# Patient Record
Sex: Male | Born: 1945 | Race: White | Hispanic: No | Marital: Married | Smoking: Former smoker
Health system: Southern US, Community
[De-identification: ages and names within clinical notes are randomized; demographics above are authoritative.]

## PROBLEM LIST (undated history)

## (undated) DIAGNOSIS — C3490 Malignant neoplasm of unspecified part of unspecified bronchus or lung: Secondary | ICD-10-CM

## (undated) DIAGNOSIS — K589 Irritable bowel syndrome without diarrhea: Secondary | ICD-10-CM

## (undated) DIAGNOSIS — Z974 Presence of external hearing-aid: Secondary | ICD-10-CM

## (undated) DIAGNOSIS — I739 Peripheral vascular disease, unspecified: Secondary | ICD-10-CM

## (undated) DIAGNOSIS — E039 Hypothyroidism, unspecified: Secondary | ICD-10-CM

## (undated) DIAGNOSIS — C9110 Chronic lymphocytic leukemia of B-cell type not having achieved remission: Secondary | ICD-10-CM

## (undated) DIAGNOSIS — K635 Polyp of colon: Secondary | ICD-10-CM

## (undated) DIAGNOSIS — D099 Carcinoma in situ, unspecified: Secondary | ICD-10-CM

## (undated) DIAGNOSIS — Z973 Presence of spectacles and contact lenses: Secondary | ICD-10-CM

## (undated) DIAGNOSIS — K219 Gastro-esophageal reflux disease without esophagitis: Secondary | ICD-10-CM

## (undated) DIAGNOSIS — Z9289 Personal history of other medical treatment: Secondary | ICD-10-CM

## (undated) DIAGNOSIS — L409 Psoriasis, unspecified: Secondary | ICD-10-CM

## (undated) DIAGNOSIS — C649 Malignant neoplasm of unspecified kidney, except renal pelvis: Secondary | ICD-10-CM

## (undated) DIAGNOSIS — I10 Essential (primary) hypertension: Secondary | ICD-10-CM

## (undated) DIAGNOSIS — M109 Gout, unspecified: Secondary | ICD-10-CM

## (undated) DIAGNOSIS — E785 Hyperlipidemia, unspecified: Secondary | ICD-10-CM

## (undated) DIAGNOSIS — L309 Dermatitis, unspecified: Secondary | ICD-10-CM

## (undated) DIAGNOSIS — C679 Malignant neoplasm of bladder, unspecified: Secondary | ICD-10-CM

## (undated) DIAGNOSIS — J189 Pneumonia, unspecified organism: Secondary | ICD-10-CM

## (undated) DIAGNOSIS — F419 Anxiety disorder, unspecified: Secondary | ICD-10-CM

## (undated) DIAGNOSIS — R0600 Dyspnea, unspecified: Secondary | ICD-10-CM

## (undated) DIAGNOSIS — I2510 Atherosclerotic heart disease of native coronary artery without angina pectoris: Secondary | ICD-10-CM

## (undated) DIAGNOSIS — R21 Rash and other nonspecific skin eruption: Secondary | ICD-10-CM

## (undated) HISTORY — DX: Psoriasis, unspecified: L40.9

## (undated) HISTORY — DX: Irritable bowel syndrome, unspecified: K58.9

## (undated) HISTORY — DX: Rash and other nonspecific skin eruption: R21

## (undated) HISTORY — DX: Pneumonia, unspecified organism: J18.9

## (undated) HISTORY — DX: Personal history of other medical treatment: Z92.89

## (undated) HISTORY — DX: Carcinoma in situ, unspecified: D09.9

## (undated) HISTORY — DX: Peripheral vascular disease, unspecified: I73.9

## (undated) HISTORY — DX: Gastro-esophageal reflux disease without esophagitis: K21.9

## (undated) HISTORY — DX: Hyperlipidemia, unspecified: E78.5

## (undated) HISTORY — DX: Gout, unspecified: M10.9

## (undated) HISTORY — DX: Essential (primary) hypertension: I10

## (undated) HISTORY — DX: Malignant neoplasm of unspecified kidney, except renal pelvis: C64.9

## (undated) HISTORY — DX: Polyp of colon: K63.5

## (undated) HISTORY — DX: Dermatitis, unspecified: L30.9

## (undated) HISTORY — PX: FINGER SURGERY: SHX640

## (undated) HISTORY — DX: Dyspnea, unspecified: R06.00

## (undated) HISTORY — DX: Atherosclerotic heart disease of native coronary artery without angina pectoris: I25.10

## (undated) HISTORY — PX: TONSILLECTOMY: SUR1361

## (undated) HISTORY — PX: COLONOSCOPY: SHX174

## (undated) HISTORY — PX: CARDIAC CATHETERIZATION: SHX172

---
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
## 1995-04-25 DIAGNOSIS — D229 Melanocytic nevi, unspecified: Secondary | ICD-10-CM

## 1995-04-25 HISTORY — DX: Melanocytic nevi, unspecified: D22.9

## 2001-09-17 ENCOUNTER — Ambulatory Visit (HOSPITAL_COMMUNITY): Admission: RE | Admit: 2001-09-17 | Discharge: 2001-09-17 | Payer: Self-pay | Admitting: Cardiovascular Disease

## 2001-09-17 ENCOUNTER — Encounter: Payer: Self-pay | Admitting: Cardiovascular Disease

## 2004-12-25 HISTORY — PX: CORONARY STENT PLACEMENT: SHX1402

## 2005-01-19 ENCOUNTER — Ambulatory Visit (HOSPITAL_COMMUNITY): Admission: RE | Admit: 2005-01-19 | Discharge: 2005-01-19 | Payer: Self-pay | Admitting: Internal Medicine

## 2005-01-23 ENCOUNTER — Ambulatory Visit: Payer: Self-pay | Admitting: Cardiology

## 2005-01-27 ENCOUNTER — Ambulatory Visit: Payer: Self-pay | Admitting: Cardiology

## 2005-01-27 ENCOUNTER — Ambulatory Visit (HOSPITAL_COMMUNITY): Admission: RE | Admit: 2005-01-27 | Discharge: 2005-01-28 | Payer: Self-pay | Admitting: Cardiology

## 2005-02-06 ENCOUNTER — Ambulatory Visit: Payer: Self-pay | Admitting: Cardiology

## 2005-05-23 ENCOUNTER — Ambulatory Visit: Payer: Self-pay | Admitting: Cardiology

## 2005-10-24 ENCOUNTER — Ambulatory Visit: Payer: Self-pay

## 2005-10-26 ENCOUNTER — Ambulatory Visit: Payer: Self-pay | Admitting: Cardiology

## 2005-11-02 ENCOUNTER — Ambulatory Visit: Payer: Self-pay | Admitting: Cardiology

## 2005-11-02 ENCOUNTER — Ambulatory Visit (HOSPITAL_COMMUNITY): Admission: RE | Admit: 2005-11-02 | Discharge: 2005-11-02 | Payer: Self-pay | Admitting: Cardiology

## 2005-11-22 ENCOUNTER — Ambulatory Visit: Payer: Self-pay | Admitting: Cardiology

## 2005-12-25 DIAGNOSIS — C649 Malignant neoplasm of unspecified kidney, except renal pelvis: Secondary | ICD-10-CM

## 2005-12-25 HISTORY — DX: Malignant neoplasm of unspecified kidney, except renal pelvis: C64.9

## 2006-05-18 ENCOUNTER — Ambulatory Visit: Payer: Self-pay | Admitting: Cardiology

## 2006-08-29 ENCOUNTER — Ambulatory Visit (HOSPITAL_COMMUNITY): Admission: RE | Admit: 2006-08-29 | Discharge: 2006-08-29 | Payer: Self-pay | Admitting: Urology

## 2006-09-12 ENCOUNTER — Ambulatory Visit (HOSPITAL_COMMUNITY): Admission: RE | Admit: 2006-09-12 | Discharge: 2006-09-12 | Payer: Self-pay | Admitting: Urology

## 2006-09-12 ENCOUNTER — Encounter (INDEPENDENT_AMBULATORY_CARE_PROVIDER_SITE_OTHER): Payer: Self-pay | Admitting: *Deleted

## 2006-09-27 ENCOUNTER — Ambulatory Visit (HOSPITAL_COMMUNITY): Admission: RE | Admit: 2006-09-27 | Discharge: 2006-09-27 | Payer: Self-pay | Admitting: Urology

## 2006-10-11 ENCOUNTER — Ambulatory Visit: Payer: Self-pay | Admitting: Cardiology

## 2006-11-20 ENCOUNTER — Ambulatory Visit: Payer: Self-pay

## 2006-11-21 ENCOUNTER — Ambulatory Visit: Admission: RE | Admit: 2006-11-21 | Discharge: 2006-11-21 | Payer: Self-pay | Admitting: Urology

## 2006-12-25 HISTORY — PX: NEPHRECTOMY: SHX65

## 2006-12-28 ENCOUNTER — Inpatient Hospital Stay (HOSPITAL_COMMUNITY): Admission: RE | Admit: 2006-12-28 | Discharge: 2006-12-31 | Payer: Self-pay | Admitting: Urology

## 2006-12-28 ENCOUNTER — Encounter (INDEPENDENT_AMBULATORY_CARE_PROVIDER_SITE_OTHER): Payer: Self-pay | Admitting: Specialist

## 2007-04-16 ENCOUNTER — Ambulatory Visit: Payer: Self-pay | Admitting: Cardiology

## 2007-04-16 LAB — CONVERTED CEMR LAB
BUN: 17 mg/dL (ref 6–23)
CO2: 31 meq/L (ref 19–32)
Calcium: 10.4 mg/dL (ref 8.4–10.5)
Chloride: 106 meq/L (ref 96–112)
Creatinine, Ser: 1.5 mg/dL (ref 0.4–1.5)
GFR calc Af Amer: 62 mL/min
Glucose, Bld: 96 mg/dL (ref 70–99)
Potassium: 4.3 meq/L (ref 3.5–5.1)

## 2007-12-24 ENCOUNTER — Ambulatory Visit (HOSPITAL_COMMUNITY): Admission: RE | Admit: 2007-12-24 | Discharge: 2007-12-24 | Payer: Self-pay | Admitting: Urology

## 2008-04-15 ENCOUNTER — Ambulatory Visit: Payer: Self-pay | Admitting: Cardiology

## 2009-04-06 DIAGNOSIS — K219 Gastro-esophageal reflux disease without esophagitis: Secondary | ICD-10-CM

## 2009-04-06 DIAGNOSIS — Z8719 Personal history of other diseases of the digestive system: Secondary | ICD-10-CM

## 2009-04-06 DIAGNOSIS — I739 Peripheral vascular disease, unspecified: Secondary | ICD-10-CM | POA: Insufficient documentation

## 2009-04-06 DIAGNOSIS — I2510 Atherosclerotic heart disease of native coronary artery without angina pectoris: Secondary | ICD-10-CM | POA: Insufficient documentation

## 2009-04-06 DIAGNOSIS — C649 Malignant neoplasm of unspecified kidney, except renal pelvis: Secondary | ICD-10-CM

## 2009-04-06 DIAGNOSIS — E785 Hyperlipidemia, unspecified: Secondary | ICD-10-CM | POA: Insufficient documentation

## 2009-04-07 ENCOUNTER — Ambulatory Visit: Payer: Self-pay | Admitting: Cardiology

## 2009-04-07 ENCOUNTER — Encounter: Payer: Self-pay | Admitting: Cardiology

## 2009-05-13 ENCOUNTER — Ambulatory Visit: Payer: Self-pay

## 2009-05-13 ENCOUNTER — Ambulatory Visit: Payer: Self-pay | Admitting: Cardiology

## 2009-07-26 ENCOUNTER — Ambulatory Visit (HOSPITAL_COMMUNITY): Admission: RE | Admit: 2009-07-26 | Discharge: 2009-07-26 | Payer: Self-pay | Admitting: Urology

## 2010-04-21 ENCOUNTER — Ambulatory Visit: Payer: Self-pay | Admitting: Cardiology

## 2010-07-29 ENCOUNTER — Ambulatory Visit (HOSPITAL_COMMUNITY): Admission: RE | Admit: 2010-07-29 | Discharge: 2010-07-29 | Payer: Self-pay | Admitting: Urology

## 2011-01-24 NOTE — Assessment & Plan Note (Signed)
Summary: f1y  Medications Added NITROSTAT 0.4 MG SUBL (NITROGLYCERIN) 1 tablet under tongue at onset of chest pain; you may repeat every 5 minutes for up to 3 doses. RE CHLORDIAZEPOXIDE/CLIDINIUM 5-2.5 MG CAPS (CLIDINIUM-CHLORDIAZEPOXIDE) up to 2 as needed CENTRUM SILVER  TABS (MULTIPLE VITAMINS-MINERALS) Take 1 tablet by mouth once a day VITAMIN D 1000 UNIT  TABS (CHOLECALCIFEROL) Take 1 tablet by mouth once a day VITAMIN B-12 500 MCG  TABS (CYANOCOBALAMIN) Take 1 tablet by mouth once a day STOOL SOFTENER 100 MG CAPS (DOCUSATE SODIUM) Take 1 capsule by mouth two times a day MELATONIN 200 MCG TABS (MELATONIN) Take 1 tab by mouth at bedtime VALERIAN ROOT 450 MG CAPS (VALERIAN) Take 1 capsule by mouth at bedtime        Visit Type:  1 year follow up Primary Provider:  Elmore Guise  CC:  No complains.  History of Present Illness: In general has done well.  Took up YOGA, and he has also lost some weight.  So he feels very good, no chest pain, and overall doing well.  Nothing at all related to his heart.  Strained a rib.  Otherwise is doing extremely well.    Current Medications (verified): 1)  Atenolol 100 Mg Tabs (Atenolol) .... Take One Tablet By Mouth Daily 2)  Claritin 10 Mg Caps (Loratadine) .... Take 1 Capsule By Mouth Once A Day 3)  Aspirin 81 Mg Tbec (Aspirin) .... Take One Tablet By Mouth Daily 4)  Lipitor 10 Mg Tabs (Atorvastatin Calcium) .... Take One Tablet By Mouth Daily. 5)  Omeprazole 20 Mg Tbec (Omeprazole) .... Take 1 Tablet By Mouth Once A Day 6)  Alprazolam 0.5 Mg Tabs (Alprazolam) .... As Needed 7)  Amitriptyline Hcl 10 Mg Tabs (Amitriptyline Hcl) .Marland Kitchen.. 1 Tab Pm 8)  Levothroid 150 Mcg Tabs (Levothyroxine Sodium) .Marland Kitchen.. 1 Tablet By Mouth Once Daily 9)  Tylenol Ex St Arthritis Pain 500 Mg Tabs (Acetaminophen) .Marland Kitchen.. 1 Tab Every Eve 10)  Sm St Johns Wort 300 Mg Tabs ([address] Johns Wort) .... Once Daily 11)  Aler-Tab 25 Mg Tabs (Diphenhydramine Hcl) .... Once Daily 12)  Metamucil 30.9  % Powd (Psyllium) .... 2 To 3 Tsp Qd 13)  Nitrostat 0.4 Mg Subl (Nitroglycerin) .Marland Kitchen.. 1 Tablet Under Tongue At Onset of Chest Pain; You May Repeat Every 5 Minutes For Up To 3 Doses. 14)  Re Chlordiazepoxide/clidinium 5-2.5 Mg Caps (Clidinium-Chlordiazepoxide) .... Up To 2 As Needed 15)  Centrum Silver  Tabs (Multiple Vitamins-Minerals) .... Take 1 Tablet By Mouth Once A Day 16)  Vitamin D 1000 Unit  Tabs (Cholecalciferol) .... Take 1 Tablet By Mouth Once A Day 17)  Vitamin B-12 500 Mcg  Tabs (Cyanocobalamin) .... Take 1 Tablet By Mouth Once A Day 18)  Stool Softener 100 Mg Caps (Docusate Sodium) .... Take 1 Capsule By Mouth Two Times A Day 19)  Melatonin 200 Mcg Tabs (Melatonin) .... Take 1 Tab By Mouth At Bedtime 20)  Valerian Root 450 Mg Caps (Valerian) .... Take 1 Capsule By Mouth At Bedtime  Allergies: 1)  ! Sulfa 2)  ! Demerol  Past History:  Past Medical History: Last updated: 04/06/2009 CAD (ICD-414.00) HYPERLIPIDEMIA (ICD-272.4) PVD (ICD-443.9) RENAL CELL CANCER (ICD-189.0) GERD (ICD-530.81) IRRITABLE BOWEL SYNDROME, HX OF (ICD-V12.79)  Past Surgical History: Last updated: 04/06/2009  1. He had a tonsillectomy as a child.  2. He had finger surgery when he was 65 years old.  3. He had a colonoscopy.  4. He has a drug-eluting  stent as noted.   5. renal cell carcinoma   Family History: Last updated: April 21, 2009 His father died at 44. He had what sounds like peripheral vascular disease and also had lung cancer. His mother died at 39 and had hypertension and congestive heart failure. He has a sister who has hypertension.  Social History: Last updated: Apr 21, 2009   He has approximately 2 beers or a glass of wine per  day.  He has never smoked. Married - no children   Vital Signs:  Patient profile:   65 year old male Height:      65 inches Weight:      143.75 pounds BMI:     24.01 Pulse rate:   65 / minute Pulse rhythm:   regular Resp:     18 per minute BP sitting:    120 / 70  (left arm) Cuff size:   regular  Vitals Entered By: Vikki Ports (April 21, 2010 12:09 PM)  Physical Exam  General:  Well developed, well nourished, in no acute distress. Head:  normocephalic and atraumatic Eyes:  PERRLA/EOM intact; conjunctiva and lids normal. Lungs:  Clear bilaterally to auscultation and percussion. Heart:  PMI non displaced.  Normal S1 and S2.  No murmur or rub, or gallop.  Extremities:  No clubbing or cyanosis. Neurologic:  Alert and oriented x 3.   EKG  Procedure date:  04/21/2010  Findings:      NSR.  Nonspecific ST and T abnormality.    Impression & Recommendations:  Problem # 1:  CAD (ICD-414.00) Continues to do well from a symptomatic standpoint.  Would continue medical therapy at this point.  No changes at present.   The following medications were removed from the medication list:    Plavix 75 Mg Tabs (Clopidogrel bisulfate) .Marland Kitchen... Take one tablet by mouth daily His updated medication list for this problem includes:    Atenolol 100 Mg Tabs (Atenolol) .Marland Kitchen... Take one tablet by mouth daily    Aspirin 81 Mg Tbec (Aspirin) .Marland Kitchen... Take one tablet by mouth daily    Nitrostat 0.4 Mg Subl (Nitroglycerin) .Marland Kitchen... 1 tablet under tongue at onset of chest pain; you may repeat every 5 minutes for up to 3 doses.  Orders: EKG w/ Interpretation (93000)  Problem # 2:  HYPERLIPIDEMIA (ICD-272.4) under the management of Dr. Chilton Si.  Continue follow up with primary physician.   His updated medication list for this problem includes:    Lipitor 10 Mg Tabs (Atorvastatin calcium) .Marland Kitchen... Take one tablet by mouth daily.  Orders: EKG w/ Interpretation (93000)  Patient Instructions: 1)  Your physician has requested that you have an exercise tolerance test in 1 YEAR.  For further information please visit [email or web address]  Please also follow instruction sheet, as given.  2)  Your physician recommends that you continue on your current medications as directed.  Please refer to the Current Medication list given to you today.

## 2011-05-09 NOTE — Letter (Signed)
April 15, 2008    Erskine Speed, M.D.  [address]  [place], [place] [address]   RE:  Bradley Irwin, Bradley Irwin  MRN:  [id number]  /  DOB:  08-13-1946   Dear Ed:   I had the pleasure of seeing Bradley Irwin in the office today in followup.  From a cardiac standpoint, Bradley Irwin continues to do well.  Bradley Irwin denies any  ongoing chest pain.  Bradley Irwin has been able to be relatively active without  any difficulty.  Bradley Irwin has recently stopped his Nexium and replaced that  with omeprazole 20 mg daily.   MEDICATIONS:  1. Avapro 300 mg daily.  2. Atenolol 100 mg daily.  3. Flonase 2 puffs daily.  4. Claritin daily.  5. Aspirin 81 mg daily.  6. Plavix 75 mg daily.  7. Lipitor 10 mg daily.  8. Omeprazole 20 mg daily.   PHYSICAL EXAMINATION:  GENERAL:  Bradley Irwin is alert and oriented in no  distress.  VITAL SIGNS:  Blood pressure 120/80, pulse is 90, 5 feet 9 inches,  weight is 176 pounds.  LUNGS:  Fields are clear to auscultation and percussion.  CARDIAC:  Rhythm is regular without a significant murmur.   DIAGNOSTICS:  Electrocardiogram demonstrates normal sinus rhythm and  minor nonspecific T-wave abnormality.  Compared to previous tracings,  there is less T-wave inversion laterally.   DISCUSSION:  In general, Bradley Irwin is doing extremely well.  I am pleased with  his progress.  Bradley Irwin and I discussed the drug interaction between proton  pump inhibitors and Plavix, but Bradley Irwin is well out beyond the year from the  time of his stent implantation.  Based on this, we would not make any  changes.  Bradley Irwin and I also discussed the use of dual antiplatelet therapy  on a long-term continuing basis.  At the present time, the guidelines  recommend 1 year of Plavix, but there is considerable controversy over  this with regard to the issue of late stent thrombosis and very late  stent thrombosis.  This will be an area of continued study and Bradley Irwin and I  discussed this.   I plan to see him back in one years' time and I would be happy to see  him in the interim if further problems develop.  Please do not hesitate  to let me know.  Bradley Irwin tells me that you are following his lipids closely.    Sincerely,      Bradley Morton. Riley Kill, MD, [facility]  Electronically Signed    TDS/MedQ  DD: 04/15/2008  DT: 04/15/2008  Job #: [id number]

## 2011-05-12 NOTE — Cardiovascular Report (Signed)
NAMEPEYSON, POSTEMA              ACCOUNT NO.:  [id number]   MEDICAL RECORD NO.:  [id number]          PATIENT TYPE:  OIB   LOCATION:  2899                         FACILITY:  [facility]   PHYSICIAN:  Arturo Morton. Riley Kill, M.D. [facility] OF BIRTH:  14-Nov-1946   DATE OF PROCEDURE:  11/02/2005  DATE OF DISCHARGE:                              CARDIAC CATHETERIZATION   INDICATIONS:  Mr. Stawicki is a very delightful 65 year old gentleman well-  known to me.  He previously underwent stenting of the left anterior  descending artery in the ___________ trial.  He has had some mild atypical  symptoms, but has also had marked ST depression despite a perfusion scan  that does not demonstrate significant perfusion deficit.  Based on this, I  felt that repeat angiography was warranted.  The current study was done to  assess coronary anatomy.   PROCEDURE:  1.  Left heart catheterization.  2.  Selective coronary territory.  3.  Selective left ventriculography.   DESCRIPTION OF PROCEDURE:  The patient was brought to the catheterization  laboratory, prepped and draped in the usual fashion. Through an anterior  puncture the right femoral artery was easily entered.  A 6-French sheath was  placed.  Views of the left and right coronaries were obtained and multiple  angiographic projections.  Central aortic left atrial pressures were  measured with pigtail.  Ventriculography was formed in the RAO projection.  There were no complications.  He was taken to the holding area in  satisfactory clinical condition.  I then reviewed the films with the patient  and also with his wife.   HEMODYNAMIC DATA:  1.  Central aortic pressure 138/83, mean 105.  2.  Left ventricular pressure 128/8.  No gradient pullback across aortic      valve.   ANGIOGRAPHIC DATA:  1.  The left main coronary is free of critical disease.  2.  The left anterior descending artery demonstrates about an area of about      30-40% narrowing  segmentally in the proximal LAD overlapping the origin      of the diagonal. The diagonal is moderate in distribution, but is fairly      small in caliber.  It has an 80% ostial stenosis.  Compared to the      previous study there is perhaps very mild progression of disease in the      proximal LAD.  The mid vessel, where the stent was previously placed      remains widely patent with excellent runoff into the distal vessel.  The      distal LAD is free of critical disease.  3.  The circumflex is large-caliber vessel with mild proximal irregularity      overlapping the origin of the first marginal branch.  Importantly, after      the administration of intracoronary nitroglycerin this area looked more      like 30-40% due to vasodilatation of the distal vessel.  However, none      of this appeared to be flow-limiting and was fairly large in caliber.  4.  The right coronary  artery is a large-caliber vessel.  There is perhaps      mild irregularity in the mid vessel but no areas of high-grade disease.  5.  Ventriculography in the RAO projection reveals vigorous global systolic      function without segmental wall motion abnormality.   CONCLUSION:  1.  Normal left ventricular function.  2.  Continued patency of the stent and the mid left anterior descending      artery.  3.  Proximal plaquing of the left anterior descending system and fairly high-      grade narrowing of small diagonal branch.  4.  Scattered irregularities in the right circumflex artery as noted above.   RECOMMENDATIONS:  1.  Continued aggressive medical therapy is warranted.  The patient clearly      has a widely patent stent without evidence of compromise.  Moreover, he      does have scattered disease otherwise with a high-grade disease      involving a diagonal which is at least moderate in size and moderate-      sized distribution. The overall caliber is small.  It is not ideal for      percutaneous intervention  because the exit of the diagonal was right in      area of segmental plaquing in the LAD and would clearly result in      compromise of the LAD proper.  As a result a continued medical therapy      program would be recommended with aggressive risk factor reduction.  He      may need enhanced antihypertensive therapy, and I have discussed this      with Dr. Chilton Si and will forward the information to Dr. Chilton Si so that      appropriate changes may be made.      Arturo Morton. Riley Kill, M.D. [facility]  Electronically Signed     TDS/MEDQ  D:  11/02/2005  T:  11/02/2005  Job:  [id number]   cc:   Erskine Speed, M.D.  Fax: [phone]   Cardiac laboratory

## 2011-05-12 NOTE — Discharge Summary (Signed)
Bradley Irwin, Bradley Irwin              ACCOUNT NO.:  [id number]   MEDICAL RECORD NO.:  [id number]          PATIENT TYPE:  INP   LOCATION:  1439                         [facility]ACILITY:  [facility]   PHYSICIAN:  Ronald L. Earlene Plater, M.D.  DATE OF BIRTH:  [birth year]   DATE OF ADMISSION:  12/28/2006  DATE OF DISCHARGE:  12/31/2006                               DISCHARGE SUMMARY   HISTORY AND PHYSICAL EXAMINATION:  Mr. Borthwick is a very nice, 65-year-  old, white male who was found to have a right renal mass.  He underwent  a MRI which was indeterminate.  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  He had  needle aspiration which was atypical and the cell culture was suspicious  for renal cell carcinoma.  He was considering partial versus total  nephrectomy.  He had a drug-eluting stent and had a thorough workup and  it was felt that he needed to be maintained on aspirin.  And, after  understanding the risks, benefits, and alternatives was admitted to  undergo this procedure.   PAST MEDICAL HISTORY SOCIAL HISTORY FAMILY HISTORY, AND REVIEW OF  SYSTEMS:  Please see signed patient medical history sheet in full  detail.   PHYSICAL EXAMINATION:  He is afebrile, vital signs stable.  GENERAL:  A well-nourished, well-developed, in no acute stress, oriented  x3.  HEAD, EYES, NOSE, AND THROAT:  Normal.  NECK:  Without masses or thyromegaly.  HEART:  Normal sinus rhythm without murmurs or gallops.  ABDOMEN:  Soft, nontender without mass or organomegaly.  CHEST:  Clear in the posterior without rales or rhonchi.  EXTREMITIES:  Normal.  NEURO:  Intact.  GENITALIA:  Normal.   HOSPITAL COURSE:  The patient was admitted and after undergoing proper  preoperative evaluation was, subsequently, taken to surgery and  underwent a right radical nephrectomy uneventfully.  Postoperatively,  initially, he was doing very well.  His initial postoperative lab  results were essentially normal.  His I and O was adequate and he  continued to  do well.  By postoperative day #1, he continued to do well.  Hemoglobin and  hematocrit were stable. Creatinine had a slight increase  to 1.79 but he became more active and continued to progressively  improve.  His diet was gradually increased.  By December 30, 2006, he had  bowel movements x2.  His Foley catheter was discontinued.  The wound was  clean and soft and he became ambulatory, and he was subsequently  discharged on December 31, 2006.  He was afebrile.  Vital signs were  stable.  Chest was clear.  Abdomen soft.  He was tolerating a diet,  having bowel movements.  Wound was clean and dry.   DISCHARGE MEDICATIONS:  Included Tylox.   DISCHARGE CONDITION:  Improved.  He was given specific instruction.      Ronald L. Earlene Plater, M.D.  Electronically Signed     RLD/MEDQ  D:  12/31/2006  T:  12/31/2006  Job:  [id number]

## 2011-05-12 NOTE — Assessment & Plan Note (Signed)
[facility]                              CARDIOLOGY OFFICE NOTE   Bradley, Irwin                     MRN:          [id number]  DATE:10/11/2006                            DOB:          1946-08-31    HISTORY:  Bradley Irwin is in today for a followup visit.  He has a lesion on  his right kidney.  He is scheduled on November 23, 2006 to undergo surgery  with Dr. Earlene Plater.  This will be removed.  The patient has not been having  significant chest pain.  He did get restudied after a stent was placed, and  his stent was patent but he does have diagonal disease, but there has been  no change in his symptomatology.   CURRENT MEDICATIONS:  1. Avapro 300 mg daily.  2. Atenolol 100 mg daily.  3. Nexium 40 mg daily.  4. Flonase daily.  5. Claritin daily.  6. Aspirin 81 mg daily.  7. Plavix 75 mg daily.  8. Lipitor 10 daily.  9. Vitamins.   PHYSICAL EXAMINATION:  GENERAL:  He is an alert, oriented gentleman in no  acute distress.  VITAL SIGNS:  Blood pressure 134/84, pulse 62.  CHEST:  Lung fields are clear and the cardiac rhythm is regular.   ELECTROCARDIOGRAM:  The electrocardiogram demonstrates normal sinus rhythm  with nonspecific lateral ST-T wave changes.  Compared to previous tracings  these are virtually unchanged.   ASSESSMENT AND PLAN:  The patient does need surgery.  I want to confirm that  there is not a substantial change in his routine treadmill.  His last prior  myocardial perfusion study did not demonstrate a perfusion defect despite  the EKG abnormalities.  With this, my inclination would be to just make sure  that the stress electrocardiogram has not changed prior to surgery.  I would  hope that he could remain on low-dose aspirin up to the time of surgery  although Plavix can be stopped 5 days to 1 week prior to surgery.       Arturo Morton. Riley Kill, MD, [facility]   TDS/MedQ  DD:  10/11/2006 DT:  10/12/2006 Job #:  [id number]

## 2011-05-12 NOTE — Op Note (Signed)
Bradley Irwin, Bradley Irwin              ACCOUNT NO.:  [id number]   MEDICAL RECORD NO.:  [id number]          PATIENT TYPE:  INP   LOCATION:  0003                         FACILITY:  [facility]   PHYSICIAN:  Lucrezia Starch. Earlene Plater, M.D.  DATE OF BIRTH:  [birth year]   DATE OF PROCEDURE:  12/28/2005  DATE OF DISCHARGE:                               OPERATIVE REPORT   PREOPERATIVE DIAGNOSIS:  Right renal lesion.   POSTOPERATIVE DIAGNOSIS:  Right renal lesion.   OPERATIVE PROCEDURE:  Right radical nephrectomy.   SURGEON:  Lucrezia Starch. Earlene Plater, M.D.   ASSISTANT:  Veverly Fells. Vernie Ammons, M.D.   ANESTHESIA:  General endotracheal anesthesia.   ESTIMATED BLOOD LOSS:  150 mL.   TUBES:  16 French Foley catheter.   COMPLICATIONS:  None.   INDICATIONS FOR PROCEDURE:  Bradley Irwin is a very nice 65 year old white  male who was found on a scan to have a right renal lesion.  He  subsequently underwent an MRI which revealed a Bosniac-3 lesion and  needle aspiration revealed atypia.  He has considered all options and  after understanding the risks, benefits, and alternatives, elected to  undergo the above procedure.  Of note, he has had a full workup by Dr.  Bonnee Quin and has a drug eluting stent.  He has to be maintained on  aspirin.  It is a posterior lesion and it was felt that laparoscopic or  robotic partial nephrectomy was probably not indicated and he knows that  total nephrectomy may be indicated.   PROCEDURE IN DETAIL:  The patient was placed in a supine position.  After general endotracheal anesthesia, he was placed in the right flank  anterior position and prepped and draped with Betadine in a sterile  fashion.  A 16 French Foley catheter was inserted and the bladder was  drained of clear urine.  A right subcostal incision was made, sharp  dissection was carried down through the subcutaneous tissue.  The  external oblique, internal oblique, and transverse abdominis muscles and  fascia were incised along with  the anterior and posterior rectus sheath.  The peritoneal cavity was entered.  Exploratory laparotomy was performed  and no abnormalities were noted.   The line of Toldt was incised in the right gutter and the colon was  reflected medially.  Gerota's fascia was approached and dissected free  and the anterior Gerota's fascia was dissected.  The hilum was serially  dissected.  The renal artery and renal vein were isolated and positioned  so that they could be clamped to do a partial nephrectomy if indicated.  Next, the area of the lesion was dissected free, Gerota's fascia was  taken down.  It was very indiscrete and difficult to discern.  It was a  possibly palpable lesion, however, there was significant coverage of  parenchyma and it was felt that a partial nephrectomy would be totally  unsafe and inexact.  With a 21 gauge needle, it was aspirated and  nothing could be aspirated.  Again, with the suspicious renal lesion, it  was felt that partial nephrectomy was really not appropriate in this  situation.  The renal artery was ligated proximally with two #1 silk  ties and one distally, cut as was the renal vein in a similar manner.  The adrenal was left intact and dissection was carried down to the  junction of Gerota's fascia between the adrenal and the kidney.  The  ureter was clipped and cut and the specimen was removed.   Thorough irrigation was performed, good hemostasis was noted to be  present.  Surgicel was placed in the adrenal bed, although it was not  oozing significantly, he was on aspirin and it was felt that this would  be appropriate, and the bowel contents were placed back into the right  gutter.  Good hemostasis was noted to be present.  The abdomen was  closed in two layers with the posterior rectus sheath along with the  transversalis and internal oblique fascia and a running #1 PDS and  anterior rectus sheath and external oblique fascia were closed with a  running #1  PDS.  The subcutaneous tissue was irrigated.  The skin was  closed with skin staples.  The patient tolerated the procedure well.  We  had the pathologist cut the specimen who felt like there was a soft  tissue mass that was deeply embedded within the kidney.      Ronald L. Earlene Plater, M.D.  Electronically Signed     RLD/MEDQ  D:  12/28/2006  T:  12/28/2006  Job:  [id number]

## 2011-05-12 NOTE — Discharge Summary (Signed)
NAME:  Bradley Irwin, SANFILIPPO NO.:  [id number]   MEDICAL RECORD NO.:  [id number]          PATIENT TYPE:  OIB   LOCATION:  6532                         FACILITY:  [facility]   PHYSICIAN:  Carole Binning, M.D. LHCDATE OF BIRTH:  1946-07-14   DATE OF ADMISSION:  01/27/2005  DATE OF DISCHARGE:  01/28/2005                           DISCHARGE SUMMARY - REFERRING   DISCHARGING PHYSICIAN:  Carole Binning, M.D. [facility]   SUMMARY OF HISTORY:  Mr. Morua is a 65 year old male who has been followed  by Dr. Chilton Si for many years and describes some discomfort in his chest for  which he underwent stress testing. Stress testing showed ST-segment  depression, and thus he was referred to Dr. Riley Kill on January 23, 2005, for  cardiac catheterization. The patient describes that when he goes up about  two flights of stairs he will feel a fullness in his chest. He also  describes a soreness which seems to be there most of the time.   His history is notable for hypertension, recent outpatient testing secondary  to colonoscopy.   LABORATORY DATA:  Chest x-ray showed no active disease, chronic changes.  EKGs showed normal sinus rhythm, normal axis, and nonspecific ST-T wave  changes. Pre-procedure H&H was 14.8 and 45.1, normal indices, platelets  228,000, WBC 7.8. PT 25.8, PT 11.2. Sodium 136, potassium 4.2, BUN 19,  creatinine 1.2, normal LFTs. Hemoglobin A1C 5.8. Subsequent hematologies  were unremarkable. Subsequent chemistries were unremarkable. Post-procedure  CK, MB, and troponin were negative for myocardial infarction.   HOSPITAL COURSE:  Mr. Tabet was brought in for outpatient cardiac  catheterization. This was performed by Dr. Riley Kill on January 27, 2005.  Please see dictated note. His ejection fraction was within normal limits.  Dr. Riley Kill performed a stent placement to the proximal LAD reducing a 60%  to 90% lesion to 0% utilizing the Co-STAR protocol.  Following sheath  removal and  bedrest, the cardiac catheterization site was intact.  On  January 28, 2005, Dr. Gerri Spore reviewed the patient's chart, examined the  patient, and felt that he could be discharged home. EKG did not show any  acute changes, neither did his blood work. Thus, it was felt he could be  discharged home.   DISCHARGE DIAGNOSES:  1.  Unstable angina.  2.  Coronary artery disease, status post stenting of the left anterior      descending utilizing the Co-STAR protocol history as previously.   DISPOSITION:  He is discharged to home.   MEDICATIONS:  1.  Aspirin 325 mg daily.  2.  Plavix 75 mg daily for at least one year.  3.  Avapro 150 daily.  4.  Atenolol 100 mg daily.  5.  Nexium 400 mg daily.  6.  Flonase daily.  7.  Nitroglycerin 0.4 as needed.   He is advised no lifting, driving, sexual activity, or heavy exertion for  two days. He is to maintain a low-salt, low-fat, low-cholesterol diet. If he  has any problems with his catheterization site he is asked to call the  office. He is asked to call the office on  Monday to arrange a follow-up  appointment with Dr. Riley Kill. At the time of follow-up with Dr. Riley Kill,  consideration should be given into revealing his lipid status for continue  cardiac risk factor modification. It was noted at the time of discharge that  Dr. Gerri Spore also recommended that the patient discontinue his Neo-  Synephrine nasal spray.      EW/MEDQ  D:  01/28/2005  T:  01/28/2005  Job:  [id number]   cc:   Erskine Speed, M.D.  [address], [address]  [place]  [place] [address]  Fax: [phone]

## 2011-05-12 NOTE — H&P (Signed)
NAMEANGELL, HONSE              ACCOUNT NO.:  [id number]   MEDICAL RECORD NO.:  [id number]          PATIENT TYPE:  INP   LOCATION:  0003                         FACILITY:  [facility]   PHYSICIAN:  Lucrezia Starch. Earlene Plater, M.D.  DATE OF BIRTH:  May 09, 1946   DATE OF ADMISSION:  12/28/2006  DATE OF DISCHARGE:                              HISTORY & PHYSICAL   CHIEF COMPLAINT:  I have a kidney mass.   HISTORY OF PRESENT ILLNESS:  Mr. Bradley Irwin is a very nice 65 year old black  male who was found to have a right renal mass.  He underwent an MRI,  which revealed an indeterminate lesion, was felt to be a Bosniak 3  lesion.  He subsequently underwent needle aspiration of it, which  revealed atypical cells.  We have considered partial versus total  nephrectomy.  He has a drug-eluting stent and must be maintained on  aspirin.  He has been seen by Dr. Bonnee Quin and has had a full workup.  It is a posterior lesion so we felt that laparoscopically or robotically  would not be an appropriate candidate, so felt the procedure needed to  be done open.  After understanding risks, benefits and alternatives, he  selected to proceed with exploration, possible partial versus radical  nephrectomy.   PAST MEDICAL HISTORY:   ALLERGIES:  SULFA AND SUDAFED.   ILLNESSES:  1. He has coronary artery disease as noted.  2. Hypertension.  3. Peripheral vascular disease.  4. He has had hiatal hernia with reflux.  5. Wears corrective lenses.  6. History of pneumonia in 11/07.  7. IBS.   SURGERY:  1. He had a tonsillectomy as a child.  2. He had finger surgery when he was 65 years old.  3. He had a colonoscopy.  4. He has a drug-eluting stent as noted.   MEDICATIONS:  1. Avapro.  2. Atenolol.  3. Nexium.  4. Flonase.  5. Claritin.  6. Aspirin.  7. Plavix, which he stopped for surgery.  8. Lipitor.  9. Imodium.  10.Librax.  11.Xanax.  12.Amitriptyline.  13.Nitroglycerin.   SOCIAL HISTORY:  He has  approximately 2 beers or a glass of wine per  day.  He has never smoked.   FAMILY HISTORY:  Non-significant for current problem.   REVIEW OF SYSTEMS:  He has no shortness of breath, dyspnea on exertion,  chest pain or GI complaints.   PHYSICAL EXAMINATION:  VITAL SIGNS:  Blood pressure 175/97, pulse 70,  respirations 12, temperature 98.2 Fahrenheit.  GENERAL:  He is well developed, well nourished, alert and oriented x3.  HEAD/EYES/EARS/NOSE AND THROAT:  Normal.  NECK:  No masses or thyromegaly.  CHEST:  Normal __________ motion.  ABDOMEN:  Soft, nontender, without masses, organomegaly or hernias.  HEART:  Normal S1, S2 without murmurs or gallops.  EXTREMITIES:  Normal.  NEURO:  Intact.  SKIN:  Normal.  PENIS/MEATUS/SCROTUM/TESTICLE/ADNEXA/ANUS:  __________.  Prostate 25 g,  smooth, symmetrical, benign.   IMPRESSION:  Right renal lesion.   PLAN:  Renal exploration with partial versus radical nephrectomy.      Ronald L. Earlene Plater, M.D.  Electronically Signed     RLD/MEDQ  D:  12/28/2006  T:  12/28/2006  Job:  [id number]

## 2011-05-12 NOTE — Procedures (Signed)
[facility]                              EXERCISE TREADMILL   JUANA, MONTINI                     MRN:          [id number]  DATE:11/20/2006                            DOB:          1946/06/01    Duration of exercise was 6 minutes.  Maximum heart rate 127.  Percent of  PMHR is 78%.   COMMENTS:  Mr. Arzuaga exercised today on the Bruce protocol.  Overall  exercise tolerance was fair.  He experienced no chest pain.  The test  was terminated due to fatigue.  Peak heart rate reached 127 or 78% of  age-predicted maximum.  The resting electrocardiogram demonstrates  nonspecific ST flattening in the inferolateral leads.  This is slightly  enhanced at maximum exercise, but no more than about a millimeter.  It  is clearly less than what was seen on the previous stress test, although  the heart rate was not quite as high.  Importantly, the patient  experienced no chest pain.  Also of importance is the fact that on the  basis of the last exercise treadmill study, the patient underwent repeat  cardiac catheterization which demonstrated a high-grade ostial lesion of  a small diagonal, but continued patency of the stent. His myocardial  perfusion study at that time was also normal.   To summarize, this very nice gentleman has undergone placement of a drug-  eluting stent in the proximal left anterior descending artery.  He was  part of the COSTAR clinical trial.  This involves paclitaxel with both  stent platforms.  The patient had followup catheterization which  demonstrated a widely patent stent.  There was 30-40% proximal narrowing  of the LAD, and an 80% ostial stenosis of the diagonal with a widely  patent stent.  The circumflex has 30-40% narrowing and the right  coronary artery is without significant disease.  Overall systolic  function is well preserved.  This catheterization was done in November  2006.  The patient has continued to do well from a  cardiovascular  standpoint.  He has a lesion on his right kidney.  He has not been  having any exertional symptoms.  Importantly, he has discontinued his  Plavix in anticipation of surgery.  Our recommendation has been that he  continue low-dose baby aspirin throughout the course of the operation,  and he actually will take this up until the day of the operation, then  withhold it the morning of.   We will be happy to see him during his hospitalization and at the  present time it is pretty clear that they need to go ahead and get the  lesion out.     Arturo Morton. Riley Kill, MD, [facility]  Electronically Signed    TDS/MedQ  DD: 11/20/2006  DT: 11/20/2006  Job #: [id number]   cc:   Windy Fast L. Earlene Plater, M.D.  Erskine Speed, M.D.

## 2011-05-12 NOTE — Assessment & Plan Note (Signed)
[facility]                            CARDIOLOGY OFFICE NOTE   DURANT, SCIBILIA                     MRN:          [id number]  DATE:04/16/2007                            DOB:          1946/12/06    Mr. Cacioppo is in for a followup visit.  He underwent surgery, and did  have a small contained renal cell carcinoma.  The day after his surgery  his creatinine went up to about 1.8.  He has denied any chest pain.  He  was maintained on aspirin throughout the procedure without any negative  consequences.  He has followed up with Dr. Chilton Si as well.  His last  chest x-ray was unremarkable.   Today on physical the blood pressure is 130/82.  The pulse is 57.  LUNG FIELDS:  Are clear.  CARDIAC:  Rhythm is regular.  His incision is healing well.   His electrocardiogram demonstrates normal sinus rhythm and nonspecific  ST-T abnormalities.   IMPRESSION:  1. Coronary artery disease.  Status post stenting of the left anterior      descending artery with a drug-eluting platform.  2. Recent surgery for renal cell cancer.  3. Hyperlipidemia.   RECOMMENDATIONS:  1. Continued followup with Dr. Chilton Si.  2. Return to Cardiology in 1 year for discussion regarding dual      antiplatelet therapy.  3. We will get a basic metabolic profile, as this was last checked      just after surgery.  I will forward this to Dr. Chilton Si.     Arturo Morton. Riley Kill, MD, [facility]  Electronically Signed    TDS/MedQ  DD: 04/16/2007  DT: 04/16/2007  Job #: [id number]   cc:   Erskine Speed, M.D.  Ronald L. Earlene Plater, M.D.

## 2011-05-12 NOTE — Cardiovascular Report (Signed)
NAMEADRIANA, LINA              ACCOUNT NO.:  [id number]   MEDICAL RECORD NO.:  [id number]          PATIENT TYPE:  OIB   LOCATION:  6532                         FACILITY:  [facility]   PHYSICIAN:  Arturo Morton. Riley Kill, M.D. [facility] OF BIRTH:  1946-11-27   DATE OF PROCEDURE:  01/27/2005  DATE OF DISCHARGE:                              CARDIAC CATHETERIZATION   INDICATIONS:  Mr. Saladin is a 65 year old gentleman who underwent exercise  testing by Dr. Chilton Si.  This was found to be positive with both angina and ST  depression.  In addition, he has had significant hypertension which has been  under treatment. The current study was done to assess coronary anatomy.   PROCEDURE:  1.  Left heart catheterization.  2.  Selective coronary arteriography.  3.  Selective left ventriculography.  4.  Percutaneous transluminal coronary angioplasty and stenting of the left      anterior descending artery.   DESCRIPTION OF PROCEDURE:  The patient was brought to the catheterization  laboratory, prepped and draped in usual fashion.  Through the anterior  puncture, the right femoral artery was easily entered. A 6-French sheath was  placed using left and right coronary stent and multiple angiographic  projections. Central aortic ventricular pressures were measured with a  pigtail.  Ventriculography was performed in the RAO projection. The patient  had evidence of a high-grade left anterior descending stenosis. With this,  we discussed the various options, including PTCA of the left anterior  descending artery or treatment or bypass. The patient was desirous of  proceeding with percutaneous intervention. He had previously consented to  participate in the CoStar study. With this, he was agreeable to proceed. I  then reviewed the films with his wife and discussed the various options. The  decision was made to proceed today in the laboratory. The patient was given  300 milligrams of oral clopidogrel.  The patient  was also given heparin and  Integrilin, according to protocol. A JL-35 guide was used as well as a Luge  wire. Pre-dilatation was done with a 2.25 mm 15 mm length Maverick balloon.  Following this, a 3.0 x 28 CoStar study stent was placed in the left  anterior descending artery. This was taken up to 16 atmospheres. There was  marked improvement the appearance of the vessel. Generous doses of  intracoronary nitroglycerin were given because of recurrent spasm. A 3.25  Quantum Maverick was used to post dilate the stent. Dr. Chilton Si actually was  in the laboratory for the stent deployment. The patient had a nice  angiographic result. All catheters were subsequently moved to the femoral  sheath sewn into place. He was taken to the holding area in satisfactory  clinical condition.   HEMODYNAMIC DATA:  1.  Central aortic pressure 155/85, mean 113.  2.  Left ventricular pressure 156/16.  3.  No gradient on pullback across the aortic valve.   ANGIOGRAPHIC DATA:  1.  Ventriculography was performed in the RAO projection. Overall systolic      function was preserved. No segmental abnormalities or contraction were      identified.  2.  Left main coronary was free of critical disease.  3.  The left anterior descending artery courses to the apex. There is fair      amount of segmental plaquing proximally with about 30-40% eccentric      plaque but with a residual lumen at least two and a half. Just beyond      the takeoff of the first diagonal branch and after the first septal,      there is an area in the mid vessel with about 60% segmental plaquing,      followed by a focal 80-90% area of stenosis. Across this area was placed      28 mm x 275 CoStar study stent. This entire area was reduced to 0%      residual luminal narrowing with an excellent angiographic result. Post      dilatation was done with a 3.25 Quantum Maverick balloon. There is no      evidence of edge tear. There was excellent runoff  into the distal vessel      and what appeared to be good apposition.  There is a smaller-to-mid-      sized first diagonal branch that has about 80% proximal narrowing and 7%      mid narrowing.  It is a fairly small-caliber vessel.   The circumflex provides a first marginal and then multiple marginals  distally with about 30% mid narrowing. No critical area of disease was  noted.   The right coronary is large-caliber dominant vessel with 30-40% mid-  narrowing, and no high-grade areas of disease or noted.   CONCLUSION:  1.  Preserved left ventricular function.  2.  Successful percutaneous stenting of the mid left anterior descending      artery.  3.  Scattered lesions of the right circumflex, as noted above.  4.  Moderate plaquing of the proximal left anterior descending artery as      described in the above text.   DISPOSITION:  The patient will be treated medically. He will need aspirin  and Plavix for at least one year. Follow-up will be with Dr. Riley Kill.  Aggressive risk factor reduction will be recommended to reduce plaque  burden.      TDS/MEDQ  D:  01/27/2005  T:  01/27/2005  Job:  [id number]   cc:   Erskine Speed, M.D.  [address]  [place]  [place] [address]  Fax: [phone]

## 2011-05-17 ENCOUNTER — Encounter: Payer: Self-pay | Admitting: Cardiology

## 2011-05-18 ENCOUNTER — Encounter: Payer: Self-pay | Admitting: Cardiology

## 2011-05-18 ENCOUNTER — Ambulatory Visit (INDEPENDENT_AMBULATORY_CARE_PROVIDER_SITE_OTHER): Payer: BC Managed Care – PPO | Admitting: Cardiology

## 2011-05-18 DIAGNOSIS — I10 Essential (primary) hypertension: Secondary | ICD-10-CM | POA: Insufficient documentation

## 2011-05-18 DIAGNOSIS — E785 Hyperlipidemia, unspecified: Secondary | ICD-10-CM

## 2011-05-18 DIAGNOSIS — I2510 Atherosclerotic heart disease of native coronary artery without angina pectoris: Secondary | ICD-10-CM

## 2011-05-18 NOTE — Progress Notes (Signed)
HPI:  Patient is doing well.  He has no chest pain with exertion.  He denies other symptoms.  BP is higher, but not usually.  Of note, he is doing yoga now.  No current symptoms.   Current Outpatient Prescriptions  Medication Sig Dispense Refill  . Acetaminophen (TYLENOL ARTHRITIS PAIN PO) Take by mouth daily.        Marland Kitchen ALPRAZolam (XANAX) 0.5 MG tablet Take 0.5 mg by mouth at bedtime as needed.        Marland Kitchen aspirin 81 MG tablet Take 81 mg by mouth daily.        Marland Kitchen atenolol (TENORMIN) 100 MG tablet Take 100 mg by mouth daily.        Marland Kitchen atorvastatin (LIPITOR) 10 MG tablet Take 10 mg by mouth daily.        . Cholecalciferol (VITAMIN D) 1000 UNITS capsule Take 1,000 Units by mouth daily.        . clidinium-chlordiazePOXIDE (LIBRAX) 2.5-5 MG per capsule Take 1 capsule by mouth 3 (three) times daily as needed.        . cyanocobalamin 500 MCG tablet Take 500 mcg by mouth. Twice a month      . diphenhydrAMINE (BENADRYL) 25 MG tablet Take 25 mg by mouth daily.        Marland Kitchen docusate sodium (COLACE) 100 MG capsule Take 100 mg by mouth daily.       Marland Kitchen levothyroxine (SYNTHROID, LEVOTHROID) 125 MCG tablet Take 125 mcg by mouth daily.        Marland Kitchen loratadine (CLARITIN) 10 MG tablet Take 10 mg by mouth daily.        . nitroGLYCERIN (NITROSTAT) 0.4 MG SL tablet Place 0.4 mg under the tongue every 5 (five) minutes as needed.        . Nutritional Supplements (MELATONIN PO) Take by mouth daily.        Marland Kitchen omeprazole (PRILOSEC) 20 MG capsule Take 20 mg by mouth daily.        Satira Sark Johns Wort 300 MG TABS Take by mouth as needed.       Gerarda Fraction Root 450 MG CAPS Take by mouth daily.        Marland Kitchen DISCONTD: multivitamin (THERAGRAN) per tablet Take 1 tablet by mouth daily.        Marland Kitchen DISCONTD: amitriptyline (ELAVIL) 10 MG tablet Take 10 mg by mouth at bedtime.        Marland Kitchen DISCONTD: levothyroxine (SYNTHROID, LEVOTHROID) 150 MCG tablet Take 150 mcg by mouth daily.        Marland Kitchen DISCONTD: Psyllium (METAMUCIL) WAFR Take by mouth. 2-3 tsp daily          Allergies  Allergen Reactions  . Meperidine Hcl     REACTION: severe headache  . Sulfa Antibiotics   . Sulfonamide Derivatives     Past Medical History  Diagnosis Date  . Coronary artery disease   . Hyperlipidemia   . PVD (peripheral vascular disease)   . Renal cell cancer   . GERD (gastroesophageal reflux disease)   . IBS (irritable bowel syndrome)     Past Surgical History  Procedure Date  . Tonsillectomy   . Finger surgery 65 y/o  . Colonoscopy   . Coronary stent placement     Family History  Problem Relation Age of Onset  . Hypertension Mother   . Heart failure Mother   . Peripheral vascular disease Father   . Lung cancer Father   . Hypertension Sister  History   Social History  . Marital Status: Married    Spouse Name: N/A    Number of Children: N/A  . Years of Education: N/A   Occupational History  . Not on file.   Social History Main Topics  . Smoking status: Never Smoker   . Smokeless tobacco: Not on file  . Alcohol Use: Yes  . Drug Use: Not on file  . Sexually Active: Not on file   Other Topics Concern  . Not on file   Social History Narrative  . No narrative on file    ROS: Please see the HPI.  All other systems reviewed and negative.  PHYSICAL EXAM:  BP 163/92  Pulse 69  Resp 18  Ht 5\' 6"  (1.676 m)  Wt 148 lb 12.8 oz (67.495 kg)  BMI 24.02 kg/m2  General: Well developed, well nourished, in no acute distress. Head:  Normocephalic and atraumatic. Neck: no JVD Lungs: Clear to auscultation and percussion. Heart: Normal S1 and S2.  No murmur, rubs or gallops.  Abdomen:  Normal bowel sounds; soft; non tender; no organomegaly Pulses: Pulses normal in all 4 extremities. Extremities: No clubbing or cyanosis. No edema. Neurologic: Alert and oriented x 3.  EKG:  NSR with repole changes of LVH.    ASSESSMENT AND PLAN:

## 2011-05-18 NOTE — Assessment & Plan Note (Signed)
Followed by Dr Green 

## 2011-05-18 NOTE — Patient Instructions (Signed)
Your physician wants you to follow-up in: 18 MONTHS.  You will receive a reminder letter in the mail two months in advance. If you don't receive a letter, please call our office to schedule the follow-up appointment.  Your physician recommends that you continue on your current medications as directed. Please refer to the Current Medication list given to you today.

## 2011-05-18 NOTE — Assessment & Plan Note (Signed)
Followed closely with Dr. Chilton Si.  He is managing his BP meds.  Has LVH with repole changes on EKG.  Continue medical therapy.

## 2011-05-18 NOTE — Assessment & Plan Note (Signed)
Has not had recurrent symptoms.  Old cath report reviewed.  Will continue to monitor in eighteen months.  Has diagonal involvement and some diffuse disease with known abnormal GXT, but asymptomatic, so would not repeat at this point in the absence of symptom change.

## 2011-08-09 ENCOUNTER — Ambulatory Visit (HOSPITAL_COMMUNITY)
Admission: RE | Admit: 2011-08-09 | Discharge: 2011-08-09 | Disposition: A | Discharge status: Home / Self Care | Payer: BC Managed Care – PPO | Source: Ambulatory Visit | Attending: Urology | Admitting: Urology

## 2011-08-09 ENCOUNTER — Other Ambulatory Visit (HOSPITAL_COMMUNITY): Payer: Self-pay | Admitting: Urology

## 2011-08-09 DIAGNOSIS — C649 Malignant neoplasm of unspecified kidney, except renal pelvis: Secondary | ICD-10-CM | POA: Insufficient documentation

## 2011-08-09 DIAGNOSIS — Z87891 Personal history of nicotine dependence: Secondary | ICD-10-CM | POA: Insufficient documentation

## 2011-08-17 ENCOUNTER — Other Ambulatory Visit (HOSPITAL_COMMUNITY): Payer: Self-pay | Admitting: Urology

## 2011-08-20 ENCOUNTER — Ambulatory Visit (HOSPITAL_COMMUNITY)
Admission: RE | Admit: 2011-08-20 | Discharge: 2011-08-20 | Disposition: A | Discharge status: Home / Self Care | Payer: BC Managed Care – PPO | Source: Ambulatory Visit | Attending: Urology | Admitting: Urology

## 2011-08-20 DIAGNOSIS — K824 Cholesterolosis of gallbladder: Secondary | ICD-10-CM | POA: Insufficient documentation

## 2011-08-20 DIAGNOSIS — D7389 Other diseases of spleen: Secondary | ICD-10-CM | POA: Insufficient documentation

## 2011-08-20 DIAGNOSIS — K7689 Other specified diseases of liver: Secondary | ICD-10-CM | POA: Insufficient documentation

## 2011-08-20 DIAGNOSIS — Z85528 Personal history of other malignant neoplasm of kidney: Secondary | ICD-10-CM | POA: Insufficient documentation

## 2011-08-20 MED ORDER — GADOBENATE DIMEGLUMINE 529 MG/ML IV SOLN
13.0000 mL | Freq: Once | INTRAVENOUS | Status: AC | PRN
  Administered 2011-08-20: 13 mL via INTRAVENOUS

## 2011-09-08 ENCOUNTER — Other Ambulatory Visit (INDEPENDENT_AMBULATORY_CARE_PROVIDER_SITE_OTHER): Payer: Self-pay

## 2011-09-12 ENCOUNTER — Encounter (INDEPENDENT_AMBULATORY_CARE_PROVIDER_SITE_OTHER): Payer: Self-pay | Admitting: General Surgery

## 2011-09-12 ENCOUNTER — Ambulatory Visit (INDEPENDENT_AMBULATORY_CARE_PROVIDER_SITE_OTHER): Payer: BC Managed Care – PPO | Admitting: General Surgery

## 2011-09-12 VITALS — BP 136/96 | HR 80 | Temp 97.8°F | Resp 16 | Ht 66.0 in | Wt 154.2 lb

## 2011-09-12 DIAGNOSIS — R160 Hepatomegaly, not elsewhere classified: Secondary | ICD-10-CM

## 2011-09-12 DIAGNOSIS — C649 Malignant neoplasm of unspecified kidney, except renal pelvis: Secondary | ICD-10-CM

## 2011-09-12 DIAGNOSIS — K769 Liver disease, unspecified: Secondary | ICD-10-CM

## 2011-09-12 NOTE — Assessment & Plan Note (Addendum)
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 Will refer to Oncology. Likely will be proceeding toward surgery at that time. Could possibly do laparoscopic resection, but pt may have significant adhesions from prior R nephrectomy.   Will also present at GI conference.

## 2011-09-12 NOTE — Progress Notes (Signed)
Chief Complaint  Patient presents with  . Other    EVal liver mass referred by dr. Mena Goes. HX of renal cell carcinoma    HISTORY: Pt had CXR this summer for follow up for renal cell cancer dx 2008.  The chest xray was abnormal, and a Chest CT was performed.  This demonstrated a hypervascular mass in the r lobe near the gallbladder.  He has subsequently undergone MR with no other lesions seen in the liver.  He is asymptomatic.  He has not had any other issues.  He denies weight loss or abdominal pain.  He denies changes in his bowel habits, but has alternating diarrhea and constipation.  He has had a colonoscopy in the last 3-5 years that was negative for polyps or cancer.  He has a family history of lung cancer.  He has heart disease, but since having a stent placed in 2006, he has not had any chest pain or shortness of breath, and underwent nephrectomy without difficulty.    Past Medical History  Diagnosis Date  . Coronary artery disease   . Hyperlipidemia   . PVD (peripheral vascular disease)   . Renal cell cancer   . GERD (gastroesophageal reflux disease)   . IBS (irritable bowel syndrome)   . Hypertension   . Hearing loss   . Rash     Past Surgical History  Procedure Date  . Tonsillectomy   . Finger surgery 65 y/o  . Colonoscopy   . Coronary stent placement   . Kidney removed 12/28/2006    right    Current Outpatient Prescriptions  Medication Sig Dispense Refill  . ALPRAZolam (XANAX) 0.5 MG tablet Take 0.5 mg by mouth as needed.       . Acetaminophen (TYLENOL ARTHRITIS PAIN PO) Take by mouth daily.        Marland Kitchen aspirin 81 MG tablet Take 81 mg by mouth daily.        Marland Kitchen atenolol (TENORMIN) 100 MG tablet Take 100 mg by mouth daily.        Marland Kitchen atorvastatin (LIPITOR) 10 MG tablet Take 10 mg by mouth daily.        . Cholecalciferol (VITAMIN D) 1000 UNITS capsule Take 1,000 Units by mouth daily.        . clidinium-chlordiazePOXIDE (LIBRAX) 2.5-5 MG per capsule Take 1 capsule by mouth 3  (three) times daily as needed.        . cyanocobalamin 500 MCG tablet Take 500 mcg by mouth. Twice a month      . diphenhydrAMINE (BENADRYL) 25 MG tablet Take 25 mg by mouth daily.        Marland Kitchen docusate sodium (COLACE) 100 MG capsule Take 100 mg by mouth daily.       Marland Kitchen levothyroxine (SYNTHROID, LEVOTHROID) 125 MCG tablet Take 125 mcg by mouth daily.        Marland Kitchen loratadine (CLARITIN) 10 MG tablet Take 10 mg by mouth daily.        . nitroGLYCERIN (NITROSTAT) 0.4 MG SL tablet Place 0.4 mg under the tongue every 5 (five) minutes as needed.        . Nutritional Supplements (MELATONIN PO) Take by mouth daily.        Marland Kitchen omeprazole (PRILOSEC) 20 MG capsule Take 20 mg by mouth daily.        Gerarda Fraction Root 450 MG CAPS Take by mouth daily.           Allergies  Allergen Reactions  .  Meperidine Hcl     REACTION: severe headache  . Sulfonamide Derivatives   . Demerol Other (See Comments)    Severe headache.  . Sulfa Antibiotics Other (See Comments)    Happened when he was child. Does not remember reaction.     Family History  Problem Relation Age of Onset  . Hypertension Mother   . Heart failure Mother   . Heart disease Mother   . Peripheral vascular disease Father   . Lung cancer Father   . Cancer Father     lung  . Hypertension Sister      History   Social History  . Marital Status: Married    Spouse Name: N/A    Number of Children: N/A  . Years of Education: N/A   Social History Main Topics  . Smoking status: Former Smoker    Quit date: 10/26/1983  . Smokeless tobacco: Never Used  . Alcohol Use: Yes     2 per day on occasion  . Drug Use: No  . Sexually Active: None   Other Topics Concern  . None   Social History Narrative  . None     REVIEW OF SYSTEMS - PERTINENT POSITIVES ONLY: 12 point review of systems negative other than HPI and PMH except for hearing loss.   EXAM: Filed Vitals:   09/12/11 1610  BP: 136/96  Pulse: 80  Temp: 97.8 F (36.6 C)  Resp: 16     Gen:  No acute distress.  Well nourished and well groomed.   Neurological: Alert and oriented to person, place, and time. Coordination normal.  Head: Normocephalic and atraumatic.  Eyes: Conjunctivae are normal. Pupils are equal, round, and reactive to light. No scleral icterus.  Neck: Normal range of motion. Neck supple. No tracheal deviation or thyromegaly present. Soft tissue mass 6 cm on posterior neck consistent with lipoma.   Cardiovascular: Normal rate, regular rhythm, normal heart sounds and intact distal pulses.  Exam reveals no gallop and no friction rub.  No murmur heard. Respiratory: Effort normal.  No respiratory distress. No chest wall tenderness. Breath sounds normal.  No wheezes, rales or rhonchi.  GI: Soft. Bowel sounds are normal. The abdomen is soft and nontender.  There is no rebound and no guarding. There is a R subcostal incision that is well healed without signs of hernia.   Musculoskeletal: Normal range of motion. Extremities are nontender.  Lymphadenopathy: No cervical, preauricular, postauricular or axillary adenopathy is present Skin: Skin is warm and dry. No rash noted. No diaphoresis. No erythema. No pallor. No clubbing, cyanosis, or edema.   Psychiatric: Normal mood and affect. Behavior is normal. Judgment and thought content normal.    RADIOLOGY RESULTS: See E-Chart or I-Site for most recent results.  Images and reports are reviewed and reported above.  CT chest, ct abd/pelvis, MRI.     Liver mass, right lobe Liver mass likely recurrent renal cell cancer Will get IR ultrasound guided biopsy. Will refer to Oncology. Likely will be proceeding toward surgery at that time. Could possibly do laparoscopic resection, but pt may have significant adhesions from prior R nephrectomy.   Will also present at GI conference.    RENAL CELL CANCER August 2012 CT showed new liver lesion.      Maudry Diego MD Surgical Oncology, General and Endocrine  Surgery [facility]    Visit Diagnoses: 1. Liver mass, right lobe   2. RENAL CELL CANCER     Primary Care Physician:  Enrique Sack, MD

## 2011-09-12 NOTE — Assessment & Plan Note (Signed)
August 2012 CT showed new liver lesion.

## 2011-09-18 ENCOUNTER — Other Ambulatory Visit (HOSPITAL_COMMUNITY): Payer: BC Managed Care – PPO

## 2011-09-18 ENCOUNTER — Other Ambulatory Visit: Payer: Self-pay | Admitting: Interventional Radiology

## 2011-09-18 ENCOUNTER — Ambulatory Visit (HOSPITAL_COMMUNITY)
Admission: RE | Admit: 2011-09-18 | Discharge: 2011-09-18 | Disposition: A | Discharge status: Home / Self Care | Payer: BC Managed Care – PPO | Source: Ambulatory Visit | Attending: General Surgery | Admitting: General Surgery

## 2011-09-18 ENCOUNTER — Other Ambulatory Visit (INDEPENDENT_AMBULATORY_CARE_PROVIDER_SITE_OTHER): Payer: Self-pay | Admitting: General Surgery

## 2011-09-18 DIAGNOSIS — R160 Hepatomegaly, not elsewhere classified: Secondary | ICD-10-CM

## 2011-09-18 DIAGNOSIS — C649 Malignant neoplasm of unspecified kidney, except renal pelvis: Secondary | ICD-10-CM | POA: Insufficient documentation

## 2011-09-18 DIAGNOSIS — K7689 Other specified diseases of liver: Secondary | ICD-10-CM | POA: Insufficient documentation

## 2011-09-18 HISTORY — PX: LIVER BIOPSY: SHX301

## 2011-09-18 LAB — CBC
HCT: 43.8 % (ref 39.0–52.0)
Hemoglobin: 15.1 g/dL (ref 13.0–17.0)
MCH: 29.6 pg (ref 26.0–34.0)
MCV: 85.9 fL (ref 78.0–100.0)
Platelets: 156 10*3/uL (ref 150–400)
RBC: 5.1 MIL/uL (ref 4.22–5.81)
WBC: 5.9 10*3/uL (ref 4.0–10.5)

## 2011-09-18 LAB — APTT: aPTT: 27 seconds (ref 24–37)

## 2011-09-21 ENCOUNTER — Telehealth (INDEPENDENT_AMBULATORY_CARE_PROVIDER_SITE_OTHER): Payer: Self-pay | Admitting: General Surgery

## 2011-09-25 ENCOUNTER — Telehealth (INDEPENDENT_AMBULATORY_CARE_PROVIDER_SITE_OTHER): Payer: Self-pay

## 2011-09-25 NOTE — Telephone Encounter (Signed)
Pt called with benign pathology results on 09/21/11.

## 2011-10-04 ENCOUNTER — Encounter (INDEPENDENT_AMBULATORY_CARE_PROVIDER_SITE_OTHER): Payer: Self-pay | Admitting: General Surgery

## 2011-10-04 ENCOUNTER — Ambulatory Visit (INDEPENDENT_AMBULATORY_CARE_PROVIDER_SITE_OTHER): Payer: BC Managed Care – PPO | Admitting: General Surgery

## 2011-10-04 DIAGNOSIS — I2510 Atherosclerotic heart disease of native coronary artery without angina pectoris: Secondary | ICD-10-CM

## 2011-10-04 DIAGNOSIS — K769 Liver disease, unspecified: Secondary | ICD-10-CM

## 2011-10-04 DIAGNOSIS — R160 Hepatomegaly, not elsewhere classified: Secondary | ICD-10-CM

## 2011-10-04 NOTE — Assessment & Plan Note (Signed)
Biopsy was non diagnostic. Given MRI appearance and history of renal cell cancer, will presume malignant until proven otherwise. Plan surgical resection. Will attempt laparoscopic resection, but may have to open given prior surgery and scar location in the RUQ.   Will have to remove gallbladder due to the fact that it is overlying the tumor. Will need hand port.  Discussed risks and benefits of surgery. Discussed bleeding, infection, damage to adjacent structures, bile leak, need for further procedures, pneumonia, urinary retention, and hernia. I discussed that there may be additional lesions in the liver and that we may have to do a larger liver resection than had previously been planned. I also discussed that there may be many lesions and that we may not do the resection.  The patient's primary goal is to get a diagnosis of the mass. The surgery is highly likely to accomplish that goal.  If this is recurrent renal cell cancer or if this is some other form of cancer we will refer him to a oncologist.

## 2011-10-04 NOTE — Assessment & Plan Note (Signed)
We will get cardiology clearance from Dr. Riley Kill before scheduling the operation.

## 2011-10-04 NOTE — Progress Notes (Signed)
Chief Complaint  Patient presents with  . Follow-up    f/u discuss bx    HISTORY: Bradley Irwin is a 65 year old male who had renal cell cancer in 2008. He underwent right nephrectomy. He did not have indications for adjuvant treatment at that time. His past history is reviewed in the last note. This lesion was discovered recently incidentally. He underwent biopsy percutaneously, and pathology demonstrated a benign liver with mild microvesicular stereotactic changes. This is not concordant with what was seen on MRI. Has some mild soreness on the right side as well. He is quite anxious about this diagnosis. He denies any recent chest pain or shortness of breath.   Past Medical History  Diagnosis Date  . Coronary artery disease   . Hyperlipidemia   . PVD (peripheral vascular disease)   . Renal cell cancer   . GERD (gastroesophageal reflux disease)   . IBS (irritable bowel syndrome)   . Hypertension   . Hearing loss   . Rash   . Eczema   . Psoriasis     Past Surgical History  Procedure Date  . Tonsillectomy   . Finger surgery 65 y/o  . Colonoscopy   . Coronary stent placement   . Nephrectomy   . Liver biopsy 09/18/11    Current Outpatient Prescriptions  Medication Sig Dispense Refill  . Acetaminophen (TYLENOL ARTHRITIS PAIN PO) Take by mouth daily.        Marland Kitchen ALPRAZolam (XANAX) 0.5 MG tablet Take 0.5 mg by mouth as needed.       Marland Kitchen aspirin 81 MG tablet Take 81 mg by mouth daily.        Marland Kitchen atenolol (TENORMIN) 100 MG tablet Take 100 mg by mouth daily.        Marland Kitchen atorvastatin (LIPITOR) 10 MG tablet Take 10 mg by mouth daily.        . Cholecalciferol (VITAMIN D) 1000 UNITS capsule Take 1,000 Units by mouth daily.        . clidinium-chlordiazePOXIDE (LIBRAX) 2.5-5 MG per capsule Take 1 capsule by mouth 3 (three) times daily as needed.        . cyanocobalamin 500 MCG tablet Take 500 mcg by mouth. Twice a month      . diphenhydrAMINE (BENADRYL) 25 MG tablet Take 25 mg by mouth daily.          Marland Kitchen docusate sodium (COLACE) 100 MG capsule Take 100 mg by mouth daily.       . fluticasone (CUTIVATE) 0.05 % cream Ad lib.      Marland Kitchen levothyroxine (SYNTHROID, LEVOTHROID) 125 MCG tablet Take 125 mcg by mouth daily.        Marland Kitchen loratadine (CLARITIN) 10 MG tablet Take 10 mg by mouth daily.        . nitroGLYCERIN (NITROSTAT) 0.4 MG SL tablet Place 0.4 mg under the tongue every 5 (five) minutes as needed.        . Nutritional Supplements (MELATONIN PO) Take by mouth daily.        Marland Kitchen omeprazole (PRILOSEC) 20 MG capsule Take 20 mg by mouth daily.        Marland Kitchen PROTOPIC 0.1 % ointment Ad lib.      Gerarda Fraction Root 450 MG CAPS Take by mouth daily.           Allergies  Allergen Reactions  . Meperidine Hcl     REACTION: severe headache  . Sulfonamide Derivatives   . Demerol Other (See Comments)    Severe  headache.  . Sulfa Antibiotics Other (See Comments)    Happened when he was child. Does not remember reaction.     Family History  Problem Relation Age of Onset  . Hypertension Mother   . Heart failure Mother   . Heart disease Mother   . Peripheral vascular disease Father   . Lung cancer Father   . Cancer Father     lung  . Hypertension Sister      History   Social History  . Marital Status: Married    Spouse Name: N/A    Number of Children: N/A  . Years of Education: N/A   Social History Main Topics  . Smoking status: Former Smoker    Quit date: 10/26/1983  . Smokeless tobacco: Never Used  . Alcohol Use: Yes     2 per day on occasion  . Drug Use: No  . Sexually Active: None   Other Topics Concern  . None   Social History Narrative  . None     REVIEW OF SYSTEMS - PERTINENT POSITIVES ONLY: 12 point review of systems negative other than HPI and PMH.   EXAM: Filed Vitals:   10/04/11 1420  BP: 122/88  Pulse: 84  Temp: 98.2 F (36.8 C)  Resp: 16    Gen:  No acute distress.  Well nourished and well groomed.   Neurological: Alert and oriented to person, place, and time.  Coordination normal.  Head: Normocephalic and atraumatic.  Eyes: Conjunctivae are normal. Pupils are equal, round, and reactive to light. No scleral icterus.  Neck: Normal range of motion. Neck supple. No tracheal deviation or thyromegaly present.  Cardiovascular: Normal rate, regular rhythm and intact distal pulses.   Respiratory: Effort normal.  No respiratory distress. No chest wall tenderness. GI: The abdomen is soft and nontender.  There is no rebound and no guarding. There is a right subcostal/flank incision. Musculoskeletal: Normal range of motion. Extremities are nontender.  Skin: Skin is warm and dry. No rash noted. No diaphoresis. No erythema. No pallor. No clubbing, cyanosis, or edema.   Psychiatric: Normal mood and affect. Behavior is normal. Judgment and thought content normal.    LABORATORY RESULTS: Available labs are reviewed. Cbc and coagulation studies are normal.   RADIOLOGY RESULTS: See E-Chart or I-Site for most recent results.  Images and reports are reviewed. 2.2 cm lesion in segment 8 of liver is seen.    ASSESSMENT AND PLAN: Liver mass, right lobe Biopsy was non diagnostic. Given MRI appearance and history of renal cell cancer, will presume malignant until proven otherwise. Plan surgical resection. Will attempt laparoscopic resection, but may have to open given prior surgery and scar location in the RUQ.   Will have to remove gallbladder due to the fact that it is overlying the tumor. Will need hand port.  Discussed risks and benefits of surgery. Discussed bleeding, infection, damage to adjacent structures, bile leak, need for further procedures, pneumonia, urinary retention, and hernia. I discussed that there may be additional lesions in the liver and that we may have to do a larger liver resection than had previously been planned. I also discussed that there may be many lesions and that we may not do the resection.  The patient's primary goal is to get a  diagnosis of the mass. The surgery is highly likely to accomplish that goal.  If this is recurrent renal cell cancer or if this is some other form of cancer we will refer him to a oncologist.  CAD We will get cardiology clearance from Dr. Riley Kill before scheduling the operation.      Maudry Diego MD Surgical Oncology, General and Endocrine Surgery [facility]      Visit Diagnoses: 1. Liver mass, right lobe   2. CAD     Primary Care Physician: Enrique Sack, MD, MD

## 2011-10-04 NOTE — Patient Instructions (Signed)
IF YOU ARE TAKING ASPIRIN, COUMADIN/WARFARIN, PLAVIX, OR OTHER BLOOD THINNER, PLEASE LET us KNOW IMMEDIATELY.  WE WILL NEED TO DISCUSS WITH THE PRESCRIBING PROVIDER IF THESE ARE SAFE TO STOP. IF THESE ARE NOT STOPPED AT THE APPROPRIATE TIME, THIS WILL RESULT IN A DELAY FOR YOUR SURGERY.  DO NOT TAKE THESE MEDICATIONS OR IBUPROFEN/NAPROXEN WITHIN A WEEK BEFORE SURGERY.  The main risks of surgery are bleeding, bile leak, infection, damage to other structures, and hernia at the  incision sites.  There is a risk of having to convert to open surgery.  These complications may lead to additional procedures such as drain placement or endoscopy, and in rare cases may lead to other surgeries.   These are not common risks, but do occur.    Most patients have some constipation in the week after surgery.  You may need over the counter stool softeners or laxatives if you experience difficulty having bowel movements.    Some patients experience diarrhea or experience a need to have a bowel movement shortly after eating.  Please discuss this with me when you come back if that occurs because you may require medication if it is severe.    If the following occur, call our office at [phone]: If you have a fever over 101 or pain that is severe despite narcotics. If you have redness or drainage at the wound. If your stools become clay-colored or you become jaundiced (yellow skin/eyes) If you develop persistent nausea or vomiting.  I will follow you back up in 3-4 weeks.    Please submit any paperwork about time off work/insurance forms to the front desk.

## 2011-10-05 ENCOUNTER — Other Ambulatory Visit (INDEPENDENT_AMBULATORY_CARE_PROVIDER_SITE_OTHER): Payer: Self-pay | Admitting: General Surgery

## 2011-10-05 DIAGNOSIS — R160 Hepatomegaly, not elsewhere classified: Secondary | ICD-10-CM

## 2011-10-10 ENCOUNTER — Telehealth (INDEPENDENT_AMBULATORY_CARE_PROVIDER_SITE_OTHER): Payer: Self-pay

## 2011-10-10 NOTE — Telephone Encounter (Signed)
Left message for Bradley Irwin that it was fine for him to have his flu shot prior to surgery.

## 2011-10-13 ENCOUNTER — Ambulatory Visit (INDEPENDENT_AMBULATORY_CARE_PROVIDER_SITE_OTHER): Payer: BC Managed Care – PPO | Admitting: Cardiology

## 2011-10-13 ENCOUNTER — Encounter: Payer: Self-pay | Admitting: Cardiology

## 2011-10-13 DIAGNOSIS — I2510 Atherosclerotic heart disease of native coronary artery without angina pectoris: Secondary | ICD-10-CM

## 2011-10-13 DIAGNOSIS — I10 Essential (primary) hypertension: Secondary | ICD-10-CM

## 2011-10-13 DIAGNOSIS — R160 Hepatomegaly, not elsewhere classified: Secondary | ICD-10-CM

## 2011-10-13 DIAGNOSIS — K769 Liver disease, unspecified: Secondary | ICD-10-CM

## 2011-10-13 DIAGNOSIS — E785 Hyperlipidemia, unspecified: Secondary | ICD-10-CM

## 2011-10-13 NOTE — Progress Notes (Signed)
HPI:  He is doing well.  Denies any chest pain.  Feels good.  However, has a liver lesion and surgery has been proposed.  The patient has no angina.  He can walk the equivalent of 4 mets or greater without difficulty.  He has LVH with repole changes due to hypertension on his ECG.  They also want to stop his ASA which has done before. Because of his costar stent, our preference has been for continuation unless it poses big risks.  He is doing really quite well.  His  BP is variable for sure.  Most likely the surgery will be laparscopic, but could turn to a full open undertaking.  He has a liver lesion with an indeterminate biopsy.  Seeing Dr. Donell Beers.   Current Outpatient Prescriptions  Medication Sig Dispense Refill  . Acetaminophen (TYLENOL ARTHRITIS PAIN PO) Take by mouth daily.        Marland Kitchen ALPRAZolam (XANAX) 0.5 MG tablet Take 0.5 mg by mouth as needed.       Marland Kitchen aspirin 81 MG tablet Take 81 mg by mouth daily.        Marland Kitchen atenolol (TENORMIN) 100 MG tablet Take 100 mg by mouth daily.        Marland Kitchen atorvastatin (LIPITOR) 10 MG tablet Take 10 mg by mouth daily.        . Cholecalciferol (VITAMIN D) 1000 UNITS capsule Take 1,000 Units by mouth daily.        . clidinium-chlordiazePOXIDE (LIBRAX) 2.5-5 MG per capsule Take 1 capsule by mouth 3 (three) times daily as needed.        . cyanocobalamin 500 MCG tablet Take 500 mcg by mouth. Twice a month      . diphenhydrAMINE (BENADRYL) 25 MG tablet Take 25 mg by mouth daily.        Marland Kitchen docusate sodium (COLACE) 100 MG capsule Take 100 mg by mouth daily.       . fluticasone (CUTIVATE) 0.05 % cream Ad lib.      Marland Kitchen levothyroxine (SYNTHROID, LEVOTHROID) 125 MCG tablet Take 125 mcg by mouth daily.        Marland Kitchen loratadine (CLARITIN) 10 MG tablet Take 10 mg by mouth daily.        . nitroGLYCERIN (NITROSTAT) 0.4 MG SL tablet Place 0.4 mg under the tongue every 5 (five) minutes as needed.        . Nutritional Supplements (MELATONIN PO) Take by mouth daily.        Marland Kitchen omeprazole  (PRILOSEC) 20 MG capsule Take 20 mg by mouth daily.        Marland Kitchen PROTOPIC 0.1 % ointment Ad lib.      Gerarda Fraction Root 450 MG CAPS Take by mouth daily.          Allergies  Allergen Reactions  . Meperidine Hcl     REACTION: severe headache  . Sulfonamide Derivatives   . Demerol Other (See Comments)    Severe headache.  . Sulfa Antibiotics Other (See Comments)    Happened when he was child. Does not remember reaction.    Past Medical History  Diagnosis Date  . Coronary artery disease   . Hyperlipidemia   . PVD (peripheral vascular disease)   . Renal cell cancer   . GERD (gastroesophageal reflux disease)   . IBS (irritable bowel syndrome)   . Hypertension   . Hearing loss   . Rash   . Eczema   . Psoriasis     Past  Surgical History  Procedure Date  . Tonsillectomy   . Finger surgery 65 y/o  . Colonoscopy   . Coronary stent placement   . Nephrectomy   . Liver biopsy 09/18/11    Family History  Problem Relation Age of Onset  . Hypertension Mother   . Heart failure Mother   . Heart disease Mother   . Peripheral vascular disease Father   . Lung cancer Father   . Cancer Father     lung  . Hypertension Sister     History   Social History  . Marital Status: Married    Spouse Name: N/A    Number of Children: N/A  . Years of Education: N/A   Occupational History  . Not on file.   Social History Main Topics  . Smoking status: Former Smoker    Quit date: 10/26/1983  . Smokeless tobacco: Never Used  . Alcohol Use: Yes     2 per day on occasion  . Drug Use: No  . Sexually Active: Not on file   Other Topics Concern  . Not on file   Social History Narrative  . No narrative on file    ROS: Please see the HPI.  All other systems reviewed and negative.  PHYSICAL EXAM:  BP 175/91  Pulse 70  Ht 5\' 6"  (1.676 m)  Wt 152 lb (68.947 kg)  BMI 24.53 kg/m2  General: Well developed, well nourished, in no acute distress. Head:  Normocephalic and atraumatic. Neck:  no JVD Lungs: Clear to auscultation and percussion. Heart: Normal S1 and S2.  No murmur, rubs or gallops.  Abdomen:  Normal bowel sounds; soft; non tender; no organomegaly Pulses: Pulses normal in all 4 extremities. Extremities: No clubbing or cyanosis. No edema. Neurologic: Alert and oriented x 3.  EKG:  NSR.  Voltage for LVH.  ST and T changes, noted previously, consistent with ischemia or repolarization changes.  CATH  2006 CONCLUSION:  1. Preserved left ventricular function.  2. Successful percutaneous stenting of the mid left anterior descending  artery.  3. Scattered lesions of the right circumflex, as noted above.  4. Moderate plaquing of the proximal left anterior descending artery as  described in the above text.  DISPOSITION: The patient will be treated medically. He will need aspirin  and Plavix for at least one year. Follow-up will be with Dr. Riley Kill.  Aggressive risk factor reduction will be recommended to reduce plaque  Burden.  RECATH  NOV 2006 ANGIOGRAPHIC DATA:  1.  The left main coronary is free of critical disease.  2.  The left anterior descending artery demonstrates about an area of about      30-40% narrowing segmentally in the proximal LAD overlapping the origin      of the diagonal. The diagonal is moderate in distribution, but is fairly      small in caliber.  It has an 80% ostial stenosis.  Compared to the      previous study there is perhaps very mild progression of disease in the      proximal LAD.  The mid vessel, where the stent was previously placed      remains widely patent with excellent runoff into the distal vessel.  The      distal LAD is free of critical disease.  3.  The circumflex is large-caliber vessel with mild proximal irregularity      overlapping the origin of the first marginal branch.  Importantly, after  the administration of intracoronary nitroglycerin this area looked more      like 30-40% due to vasodilatation of the distal  vessel.  However, none      of this appeared to be flow-limiting and was fairly large in caliber.  4.  The right coronary artery is a large-caliber vessel.  There is perhaps      mild irregularity in the mid vessel but no areas of high-grade disease.  5.  Ventriculography in the RAO projection reveals vigorous global systolic      function without segmental wall motion abnormality.   CONCLUSION:  1.  Normal left ventricular function.  2.  Continued patency of the stent and the mid left anterior descending      artery.  3.  Proximal plaquing of the left anterior descending system and fairly high-      grade narrowing of small diagonal branch.  4.  Scattered irregularities in the right circumflex artery as noted above.   GXT 04/2009  -  Abnormal ST and T changes but good exercise tolerance.  ETT have been chronically abnormal   ASSESSMENT AND PLAN:

## 2011-10-13 NOTE — Assessment & Plan Note (Signed)
Borderline control.  It would be important for him to continue his beta blockers either PO or IV from the time of surgery.  He could get IV metop if he is NPO after surgery.

## 2011-10-13 NOTE — Assessment & Plan Note (Signed)
He has been stable.  Baseline treadmills have always been abnormal.  He is able to do activity without angina.  Surgery can proceed.  Optimally, he would continue ASA unless it poses an undue risk for him.

## 2011-10-13 NOTE — Patient Instructions (Signed)
Your physician recommends that you continue on your current medications as directed. Please refer to the Current Medication list given to you today.  Your physician wants you to follow-up in: 6 MONTHS. You will receive a reminder letter in the mail two months in advance. If you don't receive a letter, please call our office to schedule the follow-up appointment.  

## 2011-10-13 NOTE — Assessment & Plan Note (Signed)
Managed by Dr. Green. 

## 2011-10-14 NOTE — Assessment & Plan Note (Signed)
Surgery planned by Dr. Donell Beers

## 2011-10-24 ENCOUNTER — Encounter (HOSPITAL_COMMUNITY)
Admission: RE | Admit: 2011-10-24 | Discharge: 2011-10-24 | Disposition: A | Discharge status: Home / Self Care | Payer: BC Managed Care – PPO | Source: Ambulatory Visit | Attending: General Surgery | Admitting: General Surgery

## 2011-10-24 LAB — CBC
Hemoglobin: 15.1 g/dL (ref 13.0–17.0)
MCV: 86.2 fL (ref 78.0–100.0)
Platelets: 182 10*3/uL (ref 150–400)
RBC: 5.22 MIL/uL (ref 4.22–5.81)
WBC: 5.5 10*3/uL (ref 4.0–10.5)

## 2011-10-24 LAB — COMPREHENSIVE METABOLIC PANEL
AST: 26 U/L (ref 0–37)
Albumin: 4.3 g/dL (ref 3.5–5.2)
Calcium: 11.1 mg/dL — ABNORMAL HIGH (ref 8.4–10.5)
Chloride: 104 mEq/L (ref 96–112)
Creatinine, Ser: 1.04 mg/dL (ref 0.50–1.35)
Sodium: 141 mEq/L (ref 135–145)

## 2011-10-24 LAB — APTT: aPTT: 26 seconds (ref 24–37)

## 2011-10-24 LAB — URINALYSIS, ROUTINE W REFLEX MICROSCOPIC
Bilirubin Urine: NEGATIVE
Glucose, UA: NEGATIVE mg/dL
Leukocytes, UA: NEGATIVE
Nitrite: NEGATIVE
Specific Gravity, Urine: 1.023 (ref 1.005–1.030)
pH: 7.5 (ref 5.0–8.0)

## 2011-10-24 LAB — PROTIME-INR: INR: 0.92 (ref 0.00–1.49)

## 2011-10-24 LAB — DIFFERENTIAL
Eosinophils Absolute: 0.1 10*3/uL (ref 0.0–0.7)
Lymphocytes Relative: 27 % (ref 12–46)
Lymphs Abs: 1.5 10*3/uL (ref 0.7–4.0)
Monocytes Relative: 9 % (ref 3–12)
Neutro Abs: 3.3 10*3/uL (ref 1.7–7.7)
Neutrophils Relative %: 61 % (ref 43–77)

## 2011-10-24 LAB — SURGICAL PCR SCREEN: Staphylococcus aureus: POSITIVE — AB

## 2011-10-24 NOTE — Progress Notes (Signed)
Quick Note:  Labs ok for surgery ______ 

## 2011-10-26 ENCOUNTER — Other Ambulatory Visit (INDEPENDENT_AMBULATORY_CARE_PROVIDER_SITE_OTHER): Payer: Self-pay | Admitting: General Surgery

## 2011-10-26 ENCOUNTER — Inpatient Hospital Stay (HOSPITAL_COMMUNITY)
Admission: RE | Admit: 2011-10-26 | Discharge: 2011-10-29 | DRG: 191 | Disposition: A | Discharge status: Home / Self Care | Payer: BC Managed Care – PPO | Source: Ambulatory Visit | Attending: General Surgery | Admitting: General Surgery

## 2011-10-26 DIAGNOSIS — Z01812 Encounter for preprocedural laboratory examination: Secondary | ICD-10-CM

## 2011-10-26 DIAGNOSIS — D135 Benign neoplasm of extrahepatic bile ducts: Secondary | ICD-10-CM

## 2011-10-26 DIAGNOSIS — Z85528 Personal history of other malignant neoplasm of kidney: Secondary | ICD-10-CM

## 2011-10-26 DIAGNOSIS — L259 Unspecified contact dermatitis, unspecified cause: Secondary | ICD-10-CM | POA: Diagnosis present

## 2011-10-26 DIAGNOSIS — J4489 Other specified chronic obstructive pulmonary disease: Secondary | ICD-10-CM | POA: Diagnosis present

## 2011-10-26 DIAGNOSIS — K811 Chronic cholecystitis: Secondary | ICD-10-CM

## 2011-10-26 DIAGNOSIS — Z79899 Other long term (current) drug therapy: Secondary | ICD-10-CM

## 2011-10-26 DIAGNOSIS — I2510 Atherosclerotic heart disease of native coronary artery without angina pectoris: Secondary | ICD-10-CM | POA: Diagnosis present

## 2011-10-26 DIAGNOSIS — R160 Hepatomegaly, not elsewhere classified: Secondary | ICD-10-CM

## 2011-10-26 DIAGNOSIS — K219 Gastro-esophageal reflux disease without esophagitis: Secondary | ICD-10-CM | POA: Diagnosis present

## 2011-10-26 DIAGNOSIS — I10 Essential (primary) hypertension: Secondary | ICD-10-CM | POA: Diagnosis present

## 2011-10-26 DIAGNOSIS — Z7982 Long term (current) use of aspirin: Secondary | ICD-10-CM

## 2011-10-26 DIAGNOSIS — I739 Peripheral vascular disease, unspecified: Secondary | ICD-10-CM | POA: Diagnosis present

## 2011-10-26 DIAGNOSIS — J449 Chronic obstructive pulmonary disease, unspecified: Secondary | ICD-10-CM | POA: Diagnosis present

## 2011-10-26 DIAGNOSIS — D134 Benign neoplasm of liver: Secondary | ICD-10-CM

## 2011-10-26 DIAGNOSIS — E785 Hyperlipidemia, unspecified: Secondary | ICD-10-CM | POA: Diagnosis present

## 2011-10-26 DIAGNOSIS — K769 Liver disease, unspecified: Principal | ICD-10-CM | POA: Diagnosis present

## 2011-10-26 DIAGNOSIS — K589 Irritable bowel syndrome without diarrhea: Secondary | ICD-10-CM | POA: Diagnosis present

## 2011-10-26 HISTORY — PX: LIVER LOBECTOMY: SHX1976

## 2011-10-26 HISTORY — PX: CHOLECYSTECTOMY: SHX55

## 2011-10-27 ENCOUNTER — Encounter (INDEPENDENT_AMBULATORY_CARE_PROVIDER_SITE_OTHER): Payer: Self-pay

## 2011-10-27 LAB — COMPREHENSIVE METABOLIC PANEL
ALT: 217 U/L — ABNORMAL HIGH (ref 0–53)
Calcium: 10 mg/dL (ref 8.4–10.5)
GFR calc Af Amer: >90 mL/min (ref >90)
Glucose, Bld: 135 mg/dL — ABNORMAL HIGH (ref 70–99)
Sodium: 135 mEq/L (ref 135–145)
Total Protein: 5.2 g/dL — ABNORMAL LOW (ref 6.0–8.3)

## 2011-10-27 LAB — CBC
MCH: 28.7 pg (ref 26.0–34.0)
MCHC: 33.1 g/dL (ref 30.0–36.0)
Platelets: 125 10*3/uL — ABNORMAL LOW (ref 150–400)

## 2011-10-27 MED ORDER — TACROLIMUS 0.1 % EX OINT: TOPICAL_OINTMENT | Freq: Every day | CUTANEOUS | Status: DC | PRN

## 2011-10-28 MED ORDER — NITROGLYCERIN 0.4 MG SL SUBL: 0.4000 mg | SUBLINGUAL_TABLET | SUBLINGUAL | Status: DC | PRN

## 2011-10-28 MED ORDER — CHLORHEXIDINE GLUCONATE CLOTH 2 % EX PADS: 6.0000 | MEDICATED_PAD | Freq: Every day | CUTANEOUS | Status: DC

## 2011-10-28 MED ORDER — ATENOLOL 100 MG PO TABS
100.0000 mg | ORAL_TABLET | Freq: Every day | ORAL | Status: DC
  Filled 2011-10-28 (×2): qty 1

## 2011-10-28 MED ORDER — BISACODYL 10 MG RE SUPP: 10.0000 mg | Freq: Every day | RECTAL | Status: DC | PRN

## 2011-10-28 MED ORDER — OXYCODONE HCL 5 MG PO TABS
10.0000 mg | ORAL_TABLET | ORAL | Status: DC | PRN
  Administered 2011-10-29 (×2): 10 mg via ORAL
  Filled 2011-10-28: qty 2

## 2011-10-28 MED ORDER — HYDROMORPHONE HCL PF 1 MG/ML IJ SOLN: 1.0000 mg | INTRAMUSCULAR | Status: DC | PRN

## 2011-10-28 MED ORDER — ONDANSETRON HCL 4 MG/2ML IJ SOLN: 4.0000 mg | Freq: Four times a day (QID) | INTRAMUSCULAR | Status: DC | PRN

## 2011-10-28 MED ORDER — MORPHINE SULFATE 2 MG/ML IJ SOLN: 1.0000 mg | INTRAMUSCULAR | Status: DC | PRN

## 2011-10-28 MED ORDER — DOCUSATE SODIUM 100 MG PO CAPS
100.0000 mg | ORAL_CAPSULE | Freq: Two times a day (BID) | ORAL | Status: DC
  Administered 2011-10-29: 100 mg via ORAL
  Filled 2011-10-28: qty 1

## 2011-10-28 MED ORDER — MUPIROCIN 2 % EX OINT
TOPICAL_OINTMENT | Freq: Two times a day (BID) | CUTANEOUS | Status: DC
  Administered 2011-10-29: 10:00:00 via NASAL
  Filled 2011-10-28: qty 22

## 2011-10-28 MED ORDER — ACETAMINOPHEN 325 MG PO TABS
650.0000 mg | ORAL_TABLET | ORAL | Status: DC | PRN
  Administered 2011-10-29: 650 mg via ORAL

## 2011-10-28 MED ORDER — POTASSIUM CHLORIDE 2 MEQ/ML IV SOLN
INTRAVENOUS | Status: DC
  Filled 2011-10-28 (×2): qty 1000

## 2011-10-28 MED ORDER — ENOXAPARIN SODIUM 40 MG/0.4ML ~~LOC~~ SOLN
40.0000 mg | Freq: Every day | SUBCUTANEOUS | Status: DC
  Administered 2011-10-29: 40 mg via SUBCUTANEOUS
  Filled 2011-10-28 (×2): qty 0.4

## 2011-10-28 MED ORDER — LORAZEPAM 1 MG PO TABS: 1.0000 mg | ORAL_TABLET | Freq: Four times a day (QID) | ORAL | Status: DC | PRN

## 2011-10-28 MED ORDER — LEVOTHYROXINE SODIUM 125 MCG PO TABS
125.0000 ug | ORAL_TABLET | Freq: Every day | ORAL | Status: DC
  Administered 2011-10-29: 125 ug via ORAL
  Filled 2011-10-28 (×2): qty 1

## 2011-10-28 MED ORDER — CILIDINIUM-CHLORDIAZEPOXIDE 2.5-5 MG PO CAPS
1.0000 | ORAL_CAPSULE | Freq: Two times a day (BID) | ORAL | Status: DC | PRN
  Filled 2011-10-28: qty 1

## 2011-10-28 MED ORDER — ACETAMINOPHEN 500 MG PO TABS
500.0000 mg | ORAL_TABLET | Freq: Four times a day (QID) | ORAL | Status: DC | PRN
  Administered 2011-10-29: 500 mg via ORAL
  Filled 2011-10-28: qty 1

## 2011-10-28 MED ORDER — LORATADINE 10 MG PO TABS
10.0000 mg | ORAL_TABLET | Freq: Every day | ORAL | Status: DC
  Administered 2011-10-29: 10 mg via ORAL
  Filled 2011-10-28 (×2): qty 1

## 2011-10-29 LAB — CROSSMATCH
ABO/RH(D): O NEG
Unit division: 0

## 2011-10-29 MED ORDER — OXYCODONE HCL 10 MG PO TABS: 10.0000 mg | ORAL_TABLET | ORAL | Status: AC | PRN

## 2011-10-29 NOTE — Progress Notes (Signed)
  Subjective  Patient is feeling better.  No bowel movements yet.  Tolerating diet.  Objective: Vital signs in last 24 hours: Temp:  [97.6 F (36.4 C)-98.1 F (36.7 C)] 97.6 F (36.4 C) (11/04 0700) Pulse Rate:  [82-92] 82  (11/04 0700) Resp:  [18] 18  (11/04 0700) BP: (132-144)/(77-79) 144/79 mmHg (11/04 0700) SpO2:  [90 %-98 %] 90 % (11/04 0700)    Intake/Output from previous day: 11/03 0701 - 11/04 0700 In: 369 [I.V.:369] Out: 630 [Urine:600; Drains:30] Intake/Output this shift:    General appearance: alert and no distress GI: soft, minimal incisional tender Incision/Wound: clean, dry/  On-Q pump removed.  Lab Results:   [facility] 10/27/11 0640  WBC 8.0  HGB 12.0*  HCT 36.2*  PLT 125*   BMET  Basename 10/27/11 0640  NA 135  K 3.7  CL 101  CO2 30  GLUCOSE 135*  BUN 14  CREATININE 0.97  CALCIUM 10.0   PT/INR No results found for this basename: LABPROT:2,INR:2 in the last 72 hours ABG No results found for this basename: PHART:2,PCO2:2,PO2:2,HCO3:2 in the last 72 hours  Studies/Results: No results found.  Anti-infectives: Anti-infectives    None      Assessment/Plan: s/p laparoscopic cholecystectomy/ lap partial hepatectomy Discharge  LOS: 3 days    Railee Bonillas K. 10/29/2011

## 2011-10-31 ENCOUNTER — Encounter (INDEPENDENT_AMBULATORY_CARE_PROVIDER_SITE_OTHER): Payer: Self-pay | Admitting: General Surgery

## 2011-10-31 ENCOUNTER — Ambulatory Visit (INDEPENDENT_AMBULATORY_CARE_PROVIDER_SITE_OTHER): Payer: BC Managed Care – PPO | Admitting: General Surgery

## 2011-10-31 VITALS — BP 144/94 | HR 76 | Temp 97.9°F | Resp 20 | Ht 66.0 in | Wt 153.2 lb

## 2011-10-31 DIAGNOSIS — K769 Liver disease, unspecified: Secondary | ICD-10-CM

## 2011-10-31 DIAGNOSIS — R160 Hepatomegaly, not elsewhere classified: Secondary | ICD-10-CM

## 2011-10-31 NOTE — Patient Instructions (Signed)
OK to shower. Change dressing as needed to drain site. Can leave dressing off once drainage stops. Wait another week on driving. Follow up with me in 3 weeks.  Take Miralax daily until regular bowel movements. Take stool softeners as needed 1-2 times a day until regular bowel movements. If the above measures do not work, try dulcolax laxative pill or suppository.

## 2011-10-31 NOTE — Progress Notes (Signed)
HISTORY: Pt doing well after discharge.  His drain has been putting out minimal light red liquid.  He is eating OK.  He is taking around 2 percocet per day and 3 advil per day.  He has not had a good bowel movement yet, but is passing gas.  He denies fevers/ chills.     PERTINENT REVIEW OF SYSTEMS: Otherwise negative.     EXAM: Head: Normocephalic and atraumatic.  Eyes:  Conjunctivae are normal. Pupils are equal, round, and reactive to light. No scleral icterus.  Neck:  Normal range of motion. Neck supple. No tracheal deviation present. No thyromegaly present.  Resp: No respiratory distress, normal effort. Abd:  Abdomen is soft, non distended and non tender. No masses are palpable.  There is no rebound and no guarding.  Neurological: Alert and oriented to person, place, and time. Coordination normal.  Skin: Skin is warm and dry. No rash noted. No diaphoretic. No erythema. No pallor.  Psychiatric: Normal mood and affect. Normal behavior. Judgment and thought content normal.     Pathology reviewed and demonstrates: pending  ASSESSMENT AND PLAN:   Liver mass, right lobe Was able to do lap assisted partial hepatectomy and cholecystectomy. Mass does not appear to be renal cell cancer.  Preliminary path consistent with FNH, but we are awaiting special stains for final pathology.  Drain serosanguinous, pulled.  Pt with constipation, advised of bowel regimen.  Follow up with me in 3 weeks.         Maudry Diego, MD Surgical Oncology, General & Endocrine Surgery [facility]  GREEN, Lorenda Ishihara, MD, MD Enrique Sack, MD

## 2011-10-31 NOTE — Assessment & Plan Note (Signed)
Was able to do lap assisted partial hepatectomy and cholecystectomy. Mass does not appear to be renal cell cancer.  Preliminary path consistent with FNH, but we are awaiting special stains for final pathology.  Drain serosanguinous, pulled.  Pt with constipation, advised of bowel regimen.  Follow up with me in 3 weeks.

## 2011-10-31 NOTE — Op Note (Signed)
Bradley Irwin, Bradley Irwin              ACCOUNT NO.:  [id number]  MEDICAL RECORD NO.:  [id number]  LOCATION:  5152                         FACILITY:  [facility]  PHYSICIAN:  Almond Lint, MD       DATE OF BIRTH:  Jun 30, 1946  DATE OF PROCEDURE:  10/26/2011 DATE OF DISCHARGE:                              OPERATIVE REPORT   PREOPERATIVE DIAGNOSES: 1. Right liver mass. 2. History of renal cell carcinoma.  POSTOPERATIVE DIAGNOSES: 1. Right liver mass. 2. History of renal cell carcinoma.  PROCEDURES: 1. Laparoscopic liver ultrasound. 2. Laparoscopic cholecystectomy. 3. Laparoscopic partial hepatectomy. 4. On-Q pain pump placement.  SURGEON:  Almond Lint, MD  ASSISTANT:  Velora Heckler, MD  ANESTHESIA:  General and local.  FINDINGS:  2.1 x 1.5 cm mass, adjacent to the gallbladder fossa.  SPECIMEN:  Right liver mass and right peritoneal nodule to Pathology. Additional specimen is right peritoneal nodule also negative for malignancy at present.  FINDINGS:  Frozen section negative for malignancy.  ESTIMATED BLOOD LOSS:  300 mL.  COMPLICATIONS:  None known.  DESCRIPTION OF PROCEDURE:  Bradley Irwin was identified in the holding area and taken to the operating room and placed supine on the operating room table.  General endotracheal anesthesia was induced.  His arms were tucked.  The patient's abdomen was prepped and draped in sterile fashion.  The Foley catheter was placed in the standard fashion with the addition of lidocaine jelly in the urethra.  The patient had a prior painful Foley insertion.  This occurred without complication.  The patient was placed into reverse Trendelenburg position and rotated to the right.  The time-out was performed according to surgical safety check list.  When all was correct, we continued.  The subcostal margin was anesthetized with local anesthetic and an 8-mm incision was made with an 11 blade.  A 5-mm Optiview port was placed under direct  visualization with the 0-degree, 5-mm camera.  The pneumoperitoneum was achieved through this port.  After the liver adhesions were taken down, the laparoscopic ultrasound probe was then used to ultrasound the liver sequentially.  All segments were scanned and the only lesion visible was the one adjacent to the gallbladder in the right lobe.  This was 2.1 x 1.5 cm.  This was quite superficial.  a 12-mm port was placed into the mid abdomen between the umbilicus and the epigastrium.  This was done after the administration of local.  The incision was made vertical to be extended later to a hand port.  Two 5- mm trocars were placed in the right upper quadrant and a 12-mm port was placed in the epigastrium.  There was some right upper quadrant adhesions that were taken down with the Harmonic Scalpel.  The gallbladder was grasped and elevated toward the head.  The liver adhesions were taken down with the Harmonic Scalpel as well.  There were some filmy adhesions to the gallbladder which were taken down bluntly and with sharp dissection.  The infundibulum of the gallbladder was then retracted laterally.  The cystic duct and cystic artery were skeletonized with the [place] dissect around the right angle.    A critical view was obtained by opening  up the peritoneum between the cystic duct and cystic artery in the liver.  From both the medial and the lateral view, there were only 2 tubular structures entering the gallbladder. The cystic artery was triply clipped on the patient's side and singly clipped on the specimen side.  This was then divided.  The peritoneal attachments around the cystic duct were cleaned off some more and 4 clips were placed on the patient's side and 1 clip was placed on the specimen side.  This was then transected.  The gallbladder was taken off the gallbladder fossa with the hook cautery.  This was then retrieved through the umbilical port, later through the abdominal  port with an EndoCatch bag.    The 12-mm midabdominal port was then extended to a hand port 7 cm in size.  The subcutaneous tissue was divided with the cautery.  The fascia was opened in the midline with the cautery.  The gel port was placed in this lesion.  The patient was placed back into reverse Trendelenburg position and rotated to the left.    The mass was palpated.  The lesion was grasped and the area around the lesion was scored with the cautery.  The Harmonic Scalpel was used to take the liver parenchyma.  Once the superficial parenchyma was taken, there were several fires as the echelon stapler were used to take the liver parenchyma.  The traction was held on lesion, and took that down retracting back into the liver.  Once the lesion was taken the remainder the way off the liver, this was pulled out and a stitch used to mark the lesion.  This was sent off for frozen.  Hemostasis was achieved with the cautery.  There was one spot where it looked like there was a biliary leakage and this was elevated and clipped.  The biliary leakage went away.  No thrombolytic agent was placed on the liver bed.  A drain was placed in the right lateral port site.  This was secured in place with a 2-0 nylon.  The 4-quadrant inspection was performed, demonstrating no gross intraabdominal pathology in the other 4 sites.  There was a nodule in the old incision that was taken and sent off for frozen.  This was benign and the margin came back negative.  I also felt that the mass was not consistent with renal cell cancer.  The ports were then removed. The On-Q tunneler was used to put the On-Q catheters in the preperitoneal space adjacent to the hand port incision.  The fascia was closed with 31 looped running PDS suture.  The skin was irrigated.  The skin of all the remaining incisions were closed with 4-0 Monocryl in subcuticular fashion.  The wound was then cleaned, dried, and dressed with  Dermabond.  The patient was awakened from anesthesia and taken to the PACU in stable condition.  Needle, sponge, and instrument counts were correct.     Almond Lint, MD     FB/MEDQ  D:  10/26/2011  T:  10/26/2011  Job:  [id number]  Electronically Signed by Almond Lint MD on 10/31/2011 09:53:07 AM

## 2011-11-02 NOTE — Discharge Summary (Signed)
Physician Discharge Summary  Patient ID: Bradley Irwin MRN: [id number] DOB/AGE: 1946/08/13 65 y.o.  Admit date: 10/26/2011 Discharge date: 11/02/2011  Admission Diagnoses:  Discharge Diagnoses:  Active Problems:  Liver mass, right lobe   Discharged Condition: good  Hospital Course: Pt admitted to 5100 after lap chole/partial hepatectomy.  He did well overnight.  His pain was controlled with a PCA.  He was able to void after foley removal.  His diet was advanced as tolerated.  He was able to ambulate independently.  He was transitioned to oral pain medications and was discharged to home on POD 3 in good condition.    Consults: none  Significant Diagnostic Studies: labs: HCT 36.2 prior to discharge, plts 125k, glucose 135, AST/ALT elevated post op - expected.   Pathology - focal nodular hyperplasia (benign)    Treatments: surgery:  PROCEDURES:  1. Laparoscopic liver ultrasound.  2. Laparoscopic cholecystectomy.  3. Laparoscopic partial hepatectomy.  4. On-Q pain pump placement.    Discharge Exam: Blood pressure 134/76, pulse 75, temperature 97.4 F (36.3 C), temperature source Oral, resp. rate 18, height 5\' 6"  (1.676 m), weight 155 lb 13.8 oz (70.7 kg), SpO2 96.00%.  Head:  Normocephalic and atraumatic.  Eyes:  Conjunctivae are normal. Pupils are equal, round, and reactive to light. No scleral icterus.  Cardiovascular: Normal rate.   Respiratory: Effort normal. No respiratory distress.  GI:  Soft. Bowel sounds are normal. Abdomen is soft, non tender, non distended.  Incision with no sign of infection.  Skin:  Skin is warm and dry. No rash noted. No diaphoresis. No erythema. No pallor.  Psychiatric: Normal mood and affect.Behavior is normal. Judgment and thought content normal.    Disposition: Home or Self Care  Discharge Orders    Future Orders Please Complete By Expires   Diet - low sodium heart healthy      Increase activity slowly      Discharge instructions      Comments:   Empty and record drain output daily.  Dr. Arita Miss nurse will call you to arrange follow-up for drain removal.   Call MD for:  temperature >100.4      Call MD for:  persistant nausea and vomiting      Call MD for:  severe uncontrolled pain      Call MD for:  redness, tenderness, or signs of infection (pain, swelling, redness, odor or green/yellow discharge around incision site)      Call MD for:  difficulty breathing, headache or visual disturbances      Call MD for:  hives        Discharge Medication List as of 10/29/2011  1:48 PM    START taking these medications   Details  oxyCODONE 10 MG TABS Take 1 tablet (10 mg total) by mouth every 4 (four) hours as needed (pain)., Starting 10/29/2011, Until Wed 11/08/11, Print      CONTINUE these medications which have NOT CHANGED   Details  ALPRAZolam (XANAX) 0.5 MG tablet Take 0.5 mg by mouth 2 (two) times daily as needed. For anxiety, Until Discontinued, Historical Med    aspirin 81 MG tablet Take 81 mg by mouth daily.  , Until Discontinued, Historical Med    atenolol (TENORMIN) 100 MG tablet Take 100 mg by mouth daily. , Until Discontinued, Historical Med    atorvastatin (LIPITOR) 10 MG tablet Take 10 mg by mouth daily.  , Until Discontinued, Historical Med    cholecalciferol (VITAMIN D) 400 UNITS TABS Take  400 Units by mouth daily.  , Until Discontinued, Historical Med    clidinium-chlordiazePOXIDE (LIBRAX) 2.5-5 MG per capsule Take 1 capsule by mouth 3 (three) times daily as needed. For IBS symptoms, Until Discontinued, Historical Med    cyanocobalamin 500 MCG tablet Take 500 mcg by mouth daily.  , Until Discontinued, Historical Med    diphenhydrAMINE (BENADRYL) 25 MG tablet Take 25 mg by mouth at bedtime. , Until Discontinued, Historical Med    docusate sodium (COLACE) 100 MG capsule Take 100 mg by mouth daily. , Until Discontinued, Historical Med    ibuprofen (ADVIL,MOTRIN) 200 MG tablet Take 200 mg by mouth every 8  (eight) hours as needed. For pain , Until Discontinued, Historical Med    levothyroxine (SYNTHROID, LEVOTHROID) 125 MCG tablet Take 125 mcg by mouth daily.  , Until Discontinued, Historical Med    loratadine (CLARITIN) 10 MG tablet Take 10 mg by mouth daily.  , Until Discontinued, Historical Med    nitroGLYCERIN (NITROSTAT) 0.4 MG SL tablet Place 0.4 mg under the tongue every 5 (five) minutes as needed. For chest pain, Until Discontinued, Historical Med    omeprazole (PRILOSEC) 20 MG capsule Take 20 mg by mouth daily.  , Until Discontinued, Historical Med    PROTOPIC 0.1 % ointment Apply 1 application topically daily as needed. For psoriasis, Starting 09/06/2011, Until Discontinued, Historical Med    Valerian Root 450 MG CAPS Take 450 mg by mouth daily. , Until Discontinued, Historical Med      STOP taking these medications     acetaminophen (TYLENOL) 500 MG tablet          Signed: Taquan Bralley 11/02/2011, 3:59 PM

## 2011-11-02 NOTE — Progress Notes (Signed)
Quick Note:  Please let pt know final pathology is benign!!! ______

## 2011-11-09 ENCOUNTER — Encounter (INDEPENDENT_AMBULATORY_CARE_PROVIDER_SITE_OTHER): Payer: Self-pay | Admitting: General Surgery

## 2011-11-09 ENCOUNTER — Ambulatory Visit (INDEPENDENT_AMBULATORY_CARE_PROVIDER_SITE_OTHER): Payer: BC Managed Care – PPO | Admitting: General Surgery

## 2011-11-09 VITALS — BP 98/60 | Temp 97.5°F | Ht 66.0 in | Wt 148.0 lb

## 2011-11-09 DIAGNOSIS — T8140XA Infection following a procedure, unspecified, initial encounter: Secondary | ICD-10-CM

## 2011-11-09 DIAGNOSIS — T8149XA Infection following a procedure, other surgical site, initial encounter: Secondary | ICD-10-CM

## 2011-11-09 MED ORDER — CEPHALEXIN 500 MG PO CAPS: 500.0000 mg | ORAL_CAPSULE | Freq: Four times a day (QID) | ORAL | Status: AC

## 2011-11-09 NOTE — Progress Notes (Signed)
Chief complaint:  Redness around the incision  Procedure: Status post laparoscopic cholecystectomy and laparoscopic partial hepatectomy by Dr. Donell Beers on November 1  History of Present Ilness: 65 year old Caucasian male comes in with complaints of redness around his midline incision. He started to notice some redness on Tuesday. It does not bother him. He denies any fevers or chills. He denies any nausea or vomiting. He is having bowel movements. He denies any drainage from the wound.  Physical Exam: BP 98/60  Temp(Src) 97.5 F (36.4 C) (Temporal)  Ht 5\' 6"  (1.676 m)  Wt 148 lb (67.132 kg)  BMI 23.89 kg/m2  Well-developed thin Caucasian male in no apparent distress HEENT-atraumatic, normocephalic, pupils equal, Psych-alert, oriented, appropriate Abdomen-soft, nontender, nondistended. Well-healed incisions. He has a mini upper midline incision. There is some cellulitis around the lower part of the incision. It extends about 4 cm to the right at the 7:00 position. There is a little bit of cellulitis extending to the left. There appears to be a hematoma underneath the incision. Not fluctuant    Assessment and Plan: Status post laparoscopic cholecystectomy and partial hepatectomy with cellulitis of the incision  This appears to be a hematoma underneath the incision with some overlying cellulitis. I do not think it needs to be opened up today. I put him on Keflex for 10 days. His cellulitis was outlined. He's going to keep his previously scheduled appointment with Dr Donell Beers. He was told to call the office should he develop a fever greater than 101, worsening cellulitis, or abdominal pain.

## 2011-11-09 NOTE — Patient Instructions (Signed)
Keep appointment with Dr Donell Beers.  If redness spreads, you develop a fever (temp>101.5), worsening abdominal pain-- call us & will move up your appointment

## 2011-11-10 ENCOUNTER — Encounter (INDEPENDENT_AMBULATORY_CARE_PROVIDER_SITE_OTHER): Payer: BC Managed Care – PPO | Admitting: General Surgery

## 2011-11-13 ENCOUNTER — Encounter (INDEPENDENT_AMBULATORY_CARE_PROVIDER_SITE_OTHER): Payer: BC Managed Care – PPO | Admitting: General Surgery

## 2011-11-20 ENCOUNTER — Ambulatory Visit (INDEPENDENT_AMBULATORY_CARE_PROVIDER_SITE_OTHER): Payer: BC Managed Care – PPO | Admitting: General Surgery

## 2011-11-20 VITALS — BP 134/86 | HR 78 | Temp 97.4°F | Resp 16 | Ht 66.0 in | Wt 148.8 lb

## 2011-11-20 DIAGNOSIS — Z5189 Encounter for other specified aftercare: Secondary | ICD-10-CM

## 2011-11-20 DIAGNOSIS — Z4889 Encounter for other specified surgical aftercare: Secondary | ICD-10-CM

## 2011-11-20 NOTE — Progress Notes (Signed)
Subjective:     Patient ID: Bradley Irwin, male   DOB: [birth year], 65 y.o.   MRN: [id number]  HPI This patient presents today for evaluation of his midline wound. He was seen previously been treated for an infected seroma with Keflex for which he has finished a course of antibiotics. He is not having fevers and only has some mild tenderness in the area but overall he states has been improving. The redness around the wound as greatly improved and he is tolerating regular diet and bowel functioning.He was concerned because of a blister which has appeared at the lower aspect of his incision. He denies any drainage from this.  Review of Systems     Objective:   Physical Exam No acute distress and nontoxic-appearing  His incision is healing well the redness around the wound has progressed from the previously marked area he does have a small blister type lesion at the lower aspect of his midline incision with some fluctuant tissue and obvious fluid under this area. There is no evidence of infection though. And this appears to be seroma-type fluid. Again no evidence of active infection.    Assessment:     Status post laparoscopic cholecystectomy and liver resection with postoperative seroma. He did not appear to have any active infection at this time. He does have some clear fluid under this area of blistering. I discussed with him the options for aspiration or continued observation. Since there is no evidence of ongoing infection and this does not appear to be bothering him we will plan on leaving this alone for now and this may drain spontaneously. He is due to see Dr. Donell Beers  back in followup later this week and she will be able to reevaluate the area.    Plan:     F/u at regular follow up appt.

## 2011-11-24 ENCOUNTER — Ambulatory Visit (INDEPENDENT_AMBULATORY_CARE_PROVIDER_SITE_OTHER): Payer: BC Managed Care – PPO | Admitting: General Surgery

## 2011-11-24 ENCOUNTER — Encounter (INDEPENDENT_AMBULATORY_CARE_PROVIDER_SITE_OTHER): Payer: Self-pay | Admitting: General Surgery

## 2011-11-24 VITALS — BP 132/74 | HR 68 | Temp 97.4°F | Resp 12 | Ht 66.0 in | Wt 147.8 lb

## 2011-11-24 DIAGNOSIS — K7689 Other specified diseases of liver: Secondary | ICD-10-CM

## 2011-11-24 NOTE — Progress Notes (Signed)
HISTORY: Patient is now approximately 1 month status post laparoscopic liver resection.  He is now only taking an occasional ibuprofen. He has been compliant with lifting restrictions. He denies any nausea or vomiting. His appetite is almost back to normal and his energy level is picking up. He has a bulging area of his wound that he is concerned about.   EXAM: Head: Normocephalic and atraumatic.  Eyes:  Conjunctivae are normal. Pupils are equal, round, and reactive to light. No scleral icterus.  Neck:  Normal range of motion. Neck supple. No tracheal deviation present. No thyromegaly present.  Resp: No respiratory distress, normal effort. Abd:  Abdomen is soft, non distended and non tender. No masses are palpable.  There is no rebound and no guarding. There is some granulation tissue and serous fatty drainage at the lower border of the wound.   Neurological: Alert and oriented to person, place, and time. Coordination normal.  Skin: Skin is warm and dry. No rash noted. No diaphoretic. No erythema. No pallor.  Psychiatric: Normal mood and affect. Normal behavior. Judgment and thought content normal.     Pathology reviewed and demonstrates: FNH and chronic cholecystitis.  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 Doing well. Stitch granuloma. Follow up as needed if wound not healing.         Maudry Diego, MD Surgical Oncology, General & Endocrine Surgery [facility]  GREEN, Lorenda Ishihara, MD, MD Enrique Sack, MD

## 2011-11-24 NOTE — Assessment & Plan Note (Signed)
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 Doing well. Stitch granuloma. Follow up as needed if wound not healing.

## 2011-11-24 NOTE — Patient Instructions (Signed)
Dry dressing as needed for drainage. Call for repeat appointment if wound not healing.

## 2011-12-04 ENCOUNTER — Ambulatory Visit (INDEPENDENT_AMBULATORY_CARE_PROVIDER_SITE_OTHER): Payer: BC Managed Care – PPO | Admitting: General Surgery

## 2011-12-04 ENCOUNTER — Encounter (INDEPENDENT_AMBULATORY_CARE_PROVIDER_SITE_OTHER): Payer: Self-pay | Admitting: General Surgery

## 2011-12-04 VITALS — BP 152/86 | HR 64 | Temp 97.6°F | Resp 18 | Ht 66.0 in | Wt 149.8 lb

## 2011-12-04 DIAGNOSIS — K7689 Other specified diseases of liver: Secondary | ICD-10-CM

## 2011-12-04 NOTE — Assessment & Plan Note (Signed)
Doing well other than wound.

## 2011-12-04 NOTE — Progress Notes (Signed)
HISTORY: Pt s/p lap partial hepatectomy.  Continues to have wound issues.  I saw him 11/30 and opened area of granulation tissue.  This drained for around 7 days, then closed back up.  He denies fever/pain.      EXAM: Head: Normocephalic and atraumatic.  Eyes:  Conjunctivae are normal. Pupils are equal, round, and reactive to light. No scleral icterus.  Resp: No respiratory distress, normal effort. Abd:  Abdomen is soft, non distended and non tender. No masses are palpable.  There is no rebound and no guarding. The lower border of incision has again, a heaped up area of granulation tissue.  This is opened with a q tip and cauterized with silver nitrate.  A small piece of 1/4 " nugauze was packed into the wound.   Neurological: Alert and oriented to person, place, and time. Coordination normal.  Skin: Skin is warm and dry. No rash noted. No diaphoretic. No erythema. No pallor.  Psychiatric: Normal mood and affect. Normal behavior. Judgment and thought content normal.    ASSESSMENT AND PLAN:   Pt to remove packing in 48 hours and then use dry sterile dressing as needed for wound drainage.   Pt to call if area reaccumulates.     Maudry Diego, MD Surgical Oncology, General & Endocrine Surgery [facility]  PCP: Enrique Sack, MD, MD Enrique Sack, MD

## 2012-04-04 ENCOUNTER — Encounter: Payer: Self-pay | Admitting: Cardiology

## 2012-04-04 ENCOUNTER — Ambulatory Visit (INDEPENDENT_AMBULATORY_CARE_PROVIDER_SITE_OTHER): Payer: BC Managed Care – PPO | Admitting: Cardiology

## 2012-04-04 VITALS — BP 160/90 | HR 67 | Ht 66.0 in | Wt 153.0 lb

## 2012-04-04 DIAGNOSIS — I10 Essential (primary) hypertension: Secondary | ICD-10-CM

## 2012-04-04 DIAGNOSIS — I2510 Atherosclerotic heart disease of native coronary artery without angina pectoris: Secondary | ICD-10-CM

## 2012-04-04 DIAGNOSIS — E785 Hyperlipidemia, unspecified: Secondary | ICD-10-CM

## 2012-04-04 NOTE — Assessment & Plan Note (Signed)
Continue to remain stable.  Last cath 2006.  No new symptoms.

## 2012-04-04 NOTE — Patient Instructions (Signed)
Your physician has requested that you regularly monitor and record your blood pressure readings at home. Please use the same machine at the same time of day to check your readings and record them to bring to your follow-up visit.  Your physician wants you to follow-up in: [facility] 2014. You will receive a reminder letter in the mail two months in advance. If you don't receive a letter, please call our office to schedule the follow-up appointment.  Your physician recommends that you continue on your current medications as directed. Please refer to the Current Medication list given to you today.

## 2012-04-04 NOTE — Progress Notes (Signed)
HPI:  He is doing well.  Denies any chest pain.  Had surgery and his findings were benign.  No exertional symptoms.  Reviewed his meds.  Takes [place] wort for mild depression.    Current Outpatient Prescriptions  Medication Sig Dispense Refill  . ALPRAZolam (XANAX) 0.5 MG tablet Take 0.5 mg by mouth 2 (two) times daily as needed. For anxiety      . aspirin 81 MG tablet Take 81 mg by mouth daily.        Marland Kitchen atenolol (TENORMIN) 100 MG tablet Take 100 mg by mouth daily.       Marland Kitchen atorvastatin (LIPITOR) 10 MG tablet Take 10 mg by mouth daily.        . cholecalciferol (VITAMIN D) 400 UNITS TABS Take 400 Units by mouth daily.        . clidinium-chlordiazePOXIDE (LIBRAX) 2.5-5 MG per capsule Take 1 capsule by mouth 3 (three) times daily as needed. For IBS symptoms      . cyanocobalamin 500 MCG tablet Take 500 mcg by mouth daily.        . diphenhydrAMINE (BENADRYL) 25 MG tablet Take 25 mg by mouth at bedtime.       . docusate sodium (COLACE) 100 MG capsule Take 100 mg by mouth daily.       Marland Kitchen ibuprofen (ADVIL,MOTRIN) 200 MG tablet Take 200 mg by mouth every 8 (eight) hours as needed. For pain       . levothyroxine (SYNTHROID, LEVOTHROID) 112 MCG tablet Take 112 mcg by mouth daily.      Marland Kitchen loratadine (CLARITIN) 10 MG tablet Take 10 mg by mouth daily.        . Melatonin 3 MG CAPS Take by mouth daily.        . nitroGLYCERIN (NITROSTAT) 0.4 MG SL tablet Place 0.4 mg under the tongue every 5 (five) minutes as needed. For chest pain      . omeprazole (PRILOSEC) 20 MG capsule Take 20 mg by mouth daily.        . Polyethylene Glycol 3350 (MIRALAX PO) Take by mouth daily.        Marland Kitchen PROTOPIC 0.1 % ointment Apply 1 application topically daily as needed. For psoriasis      . Psyllium (METAMUCIL PO) Take by mouth daily.        Satira Sark Johns Wort 300 MG CAPS Take by mouth daily.        Gerarda Fraction Root 450 MG CAPS Take 450 mg by mouth daily.         Allergies  Allergen Reactions  . Meperidine Hcl     REACTION:  severe headache  . Pseudoephedrine Other (See Comments)    Hypertension   . Sulfonamide Derivatives Other (See Comments)    Unknown reaction   . Demerol Other (See Comments)    Severe headache.  . Sulfa Antibiotics Other (See Comments)    Happened when he was child. Does not remember reaction.    Past Medical History  Diagnosis Date  . Coronary artery disease   . Hyperlipidemia   . PVD (peripheral vascular disease)   . Renal cell cancer   . GERD (gastroesophageal reflux disease)   . IBS (irritable bowel syndrome)   . Hypertension   . Hearing loss   . Rash   . Eczema   . Psoriasis     Past Surgical History  Procedure Date  . Tonsillectomy   . Finger surgery 66 y/o  .  Colonoscopy   . Coronary stent placement   . Nephrectomy   . Liver biopsy 09/18/11  . Liver lobectomy 10/26/11  . Cholecystectomy 10/26/11    Family History  Problem Relation Age of Onset  . Hypertension Mother   . Heart failure Mother   . Heart disease Mother   . Peripheral vascular disease Father   . Lung cancer Father   . Cancer Father     lung  . Hypertension Sister     History   Social History  . Marital Status: Married    Spouse Name: N/A    Number of Children: N/A  . Years of Education: N/A   Occupational History  . Not on file.   Social History Main Topics  . Smoking status: Former Smoker    Quit date: 10/26/1983  . Smokeless tobacco: Never Used  . Alcohol Use: No     2 per day on occasion  . Drug Use: No  . Sexually Active: Not on file   Other Topics Concern  . Not on file   Social History Narrative  . No narrative on file    ROS: Please see the HPI.  All other systems reviewed and negative.  PHYSICAL EXAM:  BP 160/90  Pulse 67  Ht 5\' 6"  (1.676 m)  Wt 153 lb (69.4 kg)  BMI 24.69 kg/m2  General: Well developed, well nourished, in no acute distress. Head:  Normocephalic and atraumatic. Neck: no JVD Lungs: Clear to auscultation and percussion. Heart: Normal  S1 and S2.  No murmur, rubs or gallops.  Pulses: Pulses normal in all 4 extremities. Extremities: No clubbing or cyanosis. No edema. Neurologic: Alert and oriented x 3.  EKG:  NSR.  LVH with repolarization change.   ASSESSMENT AND PLAN:

## 2012-04-04 NOTE — Assessment & Plan Note (Signed)
Labs with Dr. Chilton Si.

## 2012-04-04 NOTE — Assessment & Plan Note (Addendum)
BP remains elevated.  Will defer changes to Dr. Chilton Si. I encouraged him to take his BP and if diastolics over 90 to move appointment up earlier with Dr. Chilton Si as he has LVH on ECG.

## 2012-11-26 ENCOUNTER — Encounter: Payer: Self-pay | Admitting: Gastroenterology

## 2012-11-29 ENCOUNTER — Encounter: Payer: Self-pay | Admitting: Gastroenterology

## 2013-01-02 ENCOUNTER — Ambulatory Visit (AMBULATORY_SURGERY_CENTER): Payer: Medicare Other | Admitting: *Deleted

## 2013-01-02 VITALS — Ht 66.0 in | Wt 160.6 lb

## 2013-01-02 DIAGNOSIS — Z1211 Encounter for screening for malignant neoplasm of colon: Secondary | ICD-10-CM

## 2013-01-02 MED ORDER — MOVIPREP 100 G PO SOLR: ORAL | Status: DC

## 2013-01-14 ENCOUNTER — Ambulatory Visit (AMBULATORY_SURGERY_CENTER): Payer: Medicare Other | Admitting: Gastroenterology

## 2013-01-14 ENCOUNTER — Encounter: Payer: Self-pay | Admitting: Gastroenterology

## 2013-01-14 VITALS — BP 143/78 | HR 60 | Temp 97.0°F | Resp 13 | Ht 66.0 in | Wt 160.0 lb

## 2013-01-14 DIAGNOSIS — Z1211 Encounter for screening for malignant neoplasm of colon: Secondary | ICD-10-CM

## 2013-01-14 MED ORDER — SODIUM CHLORIDE 0.9 % IV SOLN: 500.0000 mL | INTRAVENOUS | Status: DC

## 2013-01-14 NOTE — Progress Notes (Signed)
Lidocaine-40mg IV prior to Propofol InductionPropofol given over incremental dosages 

## 2013-01-14 NOTE — Patient Instructions (Addendum)
Discharge instructions given with verbal understanding. Handouts on diverticulosis and hemorrhoids given. Resume previous medications. YOU HAD AN ENDOSCOPIC PROCEDURE TODAY AT THE [facility]: Refer to the procedure report that was given to you for any specific questions about what was found during the examination.  If the procedure report does not answer your questions, please call your gastroenterologist to clarify.  If you requested that your care partner not be given the details of your procedure findings, then the procedure report has been included in a sealed envelope for you to review at your convenience later.  YOU SHOULD EXPECT: Some feelings of bloating in the abdomen. Passage of more gas than usual.  Walking can help get rid of the air that was put into your GI tract during the procedure and reduce the bloating. If you had a lower endoscopy (such as a colonoscopy or flexible sigmoidoscopy) you may notice spotting of blood in your stool or on the toilet paper. If you underwent a bowel prep for your procedure, then you may not have a normal bowel movement for a few days.  DIET: Your first meal following the procedure should be a light meal and then it is ok to progress to your normal diet.  A half-sandwich or bowl of soup is an example of a good first meal.  Heavy or fried foods are harder to digest and may make you feel nauseous or bloated.  Likewise meals heavy in dairy and vegetables can cause extra gas to form and this can also increase the bloating.  Drink plenty of fluids but you should avoid alcoholic beverages for 24 hours.  ACTIVITY: Your care partner should take you home directly after the procedure.  You should plan to take it easy, moving slowly for the rest of the day.  You can resume normal activity the day after the procedure however you should NOT DRIVE or use heavy machinery for 24 hours (because of the sedation medicines used during the test).    SYMPTOMS TO REPORT  IMMEDIATELY: A gastroenterologist can be reached at any hour.  During normal business hours, 8:30 AM to 5:00 PM Monday through Friday, call [phone].  After hours and on weekends, please call the GI answering service at [phone] who will take a message and have the physician on call contact you.   Following lower endoscopy (colonoscopy or flexible sigmoidoscopy):  Excessive amounts of blood in the stool  Significant tenderness or worsening of abdominal pains  Swelling of the abdomen that is new, acute  Fever of 100F or higher  FOLLOW UP: If any biopsies were taken you will be contacted by phone or by letter within the next 1-3 weeks.  Call your gastroenterologist if you have not heard about the biopsies in 3 weeks.  Our staff will call the home number listed on your records the next business day following your procedure to check on you and address any questions or concerns that you may have at that time regarding the information given to you following your procedure. This is a courtesy call and so if there is no answer at the home number and we have not heard from you through the emergency physician on call, we will assume that you have returned to your regular daily activities without incident.  SIGNATURES/CONFIDENTIALITY: You and/or your care partner have signed paperwork which will be entered into your electronic medical record.  These signatures attest to the fact that that the information above on your After Visit   Summary has been reviewed and is understood.  Full responsibility of the confidentiality of this discharge information lies with you and/or your care-partner. 

## 2013-01-14 NOTE — Progress Notes (Signed)
Patient did not experience any of the following events: a burn prior to discharge; a fall within the facility; wrong site/side/patient/procedure/implant event; or a hospital transfer or hospital admission upon discharge from the facility. (G8907) Patient did not have preoperative order for IV antibiotic SSI prophylaxis. (G8918)  

## 2013-01-14 NOTE — Op Note (Signed)
[facility] [address] [place], [address]   COLONOSCOPY PROCEDURE REPORT  PATIENT: Bradley Irwin, Bradley Irwin  MR#: [id number] BIRTHDATE: [birth year] , 66  yrs. old GENDER: Male ENDOSCOPIST: Meryl Dare, MD, [facility] PROCEDURE DATE:  01/14/2013 PROCEDURE:   Colonoscopy, screening ASA CLASS:   Class II INDICATIONS:average risk screening. MEDICATIONS: MAC sedation, administered by CRNA and propofol (Diprivan) 100mg  IV DESCRIPTION OF PROCEDURE:   After the risks benefits and alternatives of the procedure were thoroughly explained, informed consent was obtained.  A digital rectal exam revealed no abnormalities of the rectum.   The LB [id number]-[id number]  endoscope was introduced through the anus and advanced to the cecum, which was identified by both the appendix and ileocecal valve. No adverse events experienced.   The quality of the prep was good, using MoviPrep  The instrument was then slowly withdrawn as the colon was fully examined.  COLON FINDINGS: Mild diverticulosis was noted in the sigmoid colon. The colon was otherwise normal.  There was no diverticulosis, inflammation, polyps or cancers unless previously stated. Retroflexed views revealed small internal hemorrhoids. The time to cecum=2 minutes 30 seconds.  Withdrawal time=9 minutes 27 seconds. The scope was withdrawn and the procedure completed.  COMPLICATIONS: There were no complications.  ENDOSCOPIC IMPRESSION: 1.   Mild diverticulosis was noted in the sigmoid colon 2.   Small internal hemorrhoids  RECOMMENDATIONS: 1.  High fiber diet with liberal fluid intake. 2.  Continue current colorectal screening recommendations for "routine risk" patients with a repeat colonoscopy in 10 years.  eSigned:  Meryl Dare, MD, Clementeen Graham 01/14/2013 2:45 PM   cc: Nila Nephew, MD

## 2013-01-15 ENCOUNTER — Telehealth: Payer: Self-pay

## 2013-01-15 NOTE — Telephone Encounter (Signed)
  Follow up Call-  Call back number 01/14/2013  Post procedure Call Back phone  # [phone]  Permission to leave phone message Yes     Patient questions:  Do you have a fever, pain , or abdominal swelling? no Pain Score  0 *  Have you tolerated food without any problems? yes  Have you been able to return to your normal activities? yes  Do you have any questions about your discharge instructions: Diet   no Medications  no Follow up visit  no  Do you have questions or concerns about your Care? no  Actions: * If pain score is 4 or above: No action needed, pain <4.

## 2013-03-13 ENCOUNTER — Ambulatory Visit (INDEPENDENT_AMBULATORY_CARE_PROVIDER_SITE_OTHER): Payer: Medicare Other | Admitting: Cardiology

## 2013-03-13 ENCOUNTER — Encounter: Payer: Self-pay | Admitting: Cardiology

## 2013-03-13 VITALS — BP 198/102 | HR 69 | Ht 66.0 in | Wt 159.1 lb

## 2013-03-13 DIAGNOSIS — I10 Essential (primary) hypertension: Secondary | ICD-10-CM

## 2013-03-13 DIAGNOSIS — E785 Hyperlipidemia, unspecified: Secondary | ICD-10-CM

## 2013-03-13 DIAGNOSIS — I2510 Atherosclerotic heart disease of native coronary artery without angina pectoris: Secondary | ICD-10-CM

## 2013-03-13 NOTE — Patient Instructions (Addendum)
Your physician wants you to follow-up in: 1 YEAR with Dr Clifton James (previous pt of Dr Riley Kill). You will receive a reminder letter in the mail two months in advance. If you don't receive a letter, please call our office to schedule the follow-up appointment.  Your physician recommends that you continue on your current medications as directed. Please refer to the Current Medication list given to you today.  Your physician has requested that you have an echocardiogram. Echocardiography is a painless test that uses sound waves to create images of your heart. It provides your doctor with information about the size and shape of your heart and how well your heart's chambers and valves are working. This procedure takes approximately one hour. There are no restrictions for this procedure.

## 2013-03-13 NOTE — Progress Notes (Signed)
HPI:  This very nice gentleman is doing extremely well. He denies any ongoing chest pain or significant cardiac symptoms. He does not check his blood pressure at home very much, although I encouraged him to do so today. He said that the pressures generally run in the range of 160/80.  He currently experiences no chest pain  Current Outpatient Prescriptions  Medication Sig Dispense Refill  . ALPRAZolam (XANAX) 0.5 MG tablet Take 0.5 mg by mouth 3 (three) times daily as needed. For anxiety      . aspirin 81 MG tablet Take 81 mg by mouth daily.        Marland Kitchen atenolol (TENORMIN) 100 MG tablet Take 100 mg by mouth daily.       Marland Kitchen atorvastatin (LIPITOR) 10 MG tablet Take 10 mg by mouth daily.        . clidinium-chlordiazePOXIDE (LIBRAX) 2.5-5 MG per capsule Take 1 capsule by mouth 3 (three) times daily as needed. For IBS symptoms      . diphenhydrAMINE (BENADRYL) 25 MG tablet Take 25 mg by mouth at bedtime.       . docusate sodium (COLACE) 100 MG capsule Take 100 mg by mouth 2 (two) times daily as needed.       Marland Kitchen ibuprofen (ADVIL,MOTRIN) 200 MG tablet Take 200 mg by mouth every 8 (eight) hours as needed. For pain       . levothyroxine (SYNTHROID, LEVOTHROID) 112 MCG tablet Take 112 mcg by mouth daily.      . Melatonin 3 MG CAPS Take by mouth daily.        . nitroGLYCERIN (NITROSTAT) 0.4 MG SL tablet Place 0.4 mg under the tongue every 5 (five) minutes as needed. For chest pain      . omeprazole (PRILOSEC) 20 MG capsule Take 20 mg by mouth daily.        . Polyethylene Glycol 3350 (MIRALAX PO) Take by mouth as needed.       Marland Kitchen PROTOPIC 0.1 % ointment Apply 1 application topically daily as needed. For psoriasis      . Psyllium (METAMUCIL PO) Take by mouth as needed.       Gerarda Fraction Root 450 MG CAPS Take 450 mg by mouth as needed.        No current facility-administered medications for this visit.    Allergies  Allergen Reactions  . Meperidine Hcl     REACTION: severe headache  . Pseudoephedrine  Other (See Comments)    Hypertension   . Demerol Other (See Comments)    Severe headache.  . Sulfa Antibiotics Other (See Comments)    Happened when he was child. Does not remember reaction.    Past Medical History  Diagnosis Date  . Coronary artery disease   . Hyperlipidemia   . PVD (peripheral vascular disease)   . Renal cell cancer   . GERD (gastroesophageal reflux disease)   . IBS (irritable bowel syndrome)   . Hypertension   . Hearing loss   . Rash   . Eczema   . Psoriasis     Past Surgical History  Procedure Laterality Date  . Tonsillectomy    . Finger surgery  67 y/o  . Colonoscopy    . Coronary stent placement  2006  . Nephrectomy  2008    right  . Liver biopsy  09/18/11  . Liver lobectomy  10/26/11  . Cholecystectomy  10/26/11    Family History  Problem Relation Age of Onset  .  Hypertension Mother   . Heart failure Mother   . Heart disease Mother   . Peripheral vascular disease Father   . Lung cancer Father   . Cancer Father     lung  . Hypertension Sister   . Colon cancer Neg Hx     History   Social History  . Marital Status: Married    Spouse Name: N/A    Number of Children: N/A  . Years of Education: N/A   Occupational History  . Not on file.   Social History Main Topics  . Smoking status: Former Smoker    Quit date: 10/26/1983  . Smokeless tobacco: Never Used  . Alcohol Use: 8.4 oz/week    14 Cans of beer per week  . Drug Use: No  . Sexually Active: Not on file   Other Topics Concern  . Not on file   Social History Narrative  . No narrative on file    ROS: Please see the HPI.  All other systems reviewed and negative.  PHYSICAL EXAM:  BP 198/102  Pulse 69  Ht 5\' 6"  (1.676 m)  Wt 159 lb 1.9 oz (72.176 kg)  BMI 25.69 kg/m2  180/90 by me  General: Well developed, well nourished, in no acute distress. Head:  Normocephalic and atraumatic. Neck: no JVD Lungs: Clear to auscultation and percussion. Heart: Normal S1 and S2.   No murmur, rubs or gallops.  Abdomen:  Normal bowel sounds; soft; non tender; no organomegaly Pulses: Pulses normal in all 4 extremities. Extremities: No clubbing or cyanosis. No edema. Neurologic: Alert and oriented x 3.  EKG:  NSR.  Probable LVH with repole changes, somewhat similar to one year earlier.    ASSESSMENT AND PLAN:  1.  CAD sp stenting of the LAD with DES stent  ([facility] Trial) 2.  HTN  Poorly controlled.  3.  Hyperlipidemia -- on statin per Dr Chilton Si.     FU McAlhany one year CheckBP at home and fu with Dr. Chilton Si in next few weeks for possible change in BP meds.  Echo -- LVH on ECG   Document current EF given persistent BP elevation.

## 2013-03-16 NOTE — Assessment & Plan Note (Signed)
BP seems high. I have asked him to record pressures and take them to Bradley Irwin for follow up and intervention.  Has likely LVH and will get echo to document.

## 2013-03-16 NOTE — Assessment & Plan Note (Signed)
He appears to be stable.  No major symptoms.  Had COSTAR DES investigational DES implanted and has done well.  Continue medical therapy.

## 2013-03-16 NOTE — Assessment & Plan Note (Signed)
Under the care of Dr. Chilton Si.

## 2013-03-18 ENCOUNTER — Ambulatory Visit (HOSPITAL_COMMUNITY): Payer: Medicare Other | Attending: Cardiovascular Disease | Admitting: Radiology

## 2013-03-18 DIAGNOSIS — I739 Peripheral vascular disease, unspecified: Secondary | ICD-10-CM | POA: Insufficient documentation

## 2013-03-18 DIAGNOSIS — I2510 Atherosclerotic heart disease of native coronary artery without angina pectoris: Secondary | ICD-10-CM | POA: Insufficient documentation

## 2013-03-18 DIAGNOSIS — I10 Essential (primary) hypertension: Secondary | ICD-10-CM | POA: Insufficient documentation

## 2013-03-18 NOTE — Progress Notes (Signed)
Echocardiogram performed.  

## 2013-07-30 ENCOUNTER — Other Ambulatory Visit: Payer: Self-pay | Admitting: Dermatology

## 2013-07-30 DIAGNOSIS — D039 Melanoma in situ, unspecified: Secondary | ICD-10-CM

## 2013-07-30 HISTORY — DX: Melanoma in situ, unspecified: D03.9

## 2013-08-07 ENCOUNTER — Other Ambulatory Visit: Payer: Self-pay | Admitting: Internal Medicine

## 2013-08-08 ENCOUNTER — Ambulatory Visit
Admission: RE | Admit: 2013-08-08 | Discharge: 2013-08-08 | Disposition: A | Discharge status: Home / Self Care | Payer: 59 | Source: Ambulatory Visit | Attending: Internal Medicine | Admitting: Internal Medicine

## 2013-10-17 ENCOUNTER — Ambulatory Visit (INDEPENDENT_AMBULATORY_CARE_PROVIDER_SITE_OTHER): Payer: Medicare Other | Admitting: General Surgery

## 2013-10-17 ENCOUNTER — Encounter (INDEPENDENT_AMBULATORY_CARE_PROVIDER_SITE_OTHER): Payer: Self-pay

## 2013-10-17 ENCOUNTER — Encounter (INDEPENDENT_AMBULATORY_CARE_PROVIDER_SITE_OTHER): Payer: Self-pay | Admitting: General Surgery

## 2013-10-17 VITALS — BP 138/82 | HR 72 | Resp 14 | Ht 66.0 in | Wt 170.0 lb

## 2013-10-17 DIAGNOSIS — D1739 Benign lipomatous neoplasm of skin and subcutaneous tissue of other sites: Secondary | ICD-10-CM

## 2013-10-17 DIAGNOSIS — D170 Benign lipomatous neoplasm of skin and subcutaneous tissue of head, face and neck: Secondary | ICD-10-CM

## 2013-10-17 NOTE — Progress Notes (Signed)
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 He has been doing very well except for an issue with a lipoma on his posterior neck. This has been enlarging. It also is sometimes sore. It is right over his trapezius. He denies any drainage or infection. He has not felt any hard portions of it.  Past Medical History  Diagnosis Date  . Coronary artery disease   . Hyperlipidemia   . PVD (peripheral vascular disease)   . Renal cell cancer   . GERD (gastroesophageal reflux disease)   . IBS (irritable bowel syndrome)   . Hypertension   . Hearing loss   . Rash   . Eczema   . Psoriasis     Past Surgical History  Procedure Laterality Date  . Tonsillectomy    . Finger surgery  67 y/o  . Colonoscopy    . Coronary stent placement  2006  . Nephrectomy  2008    right  . Liver biopsy  09/18/11  . Liver lobectomy  10/26/11  . Cholecystectomy  10/26/11    Current Outpatient Prescriptions  Medication Sig Dispense Refill  . ALPRAZolam (XANAX) 0.5 MG tablet Take 0.5 mg by mouth 3 (three) times daily as needed. For anxiety      . aspirin 81 MG tablet Take 81 mg by mouth daily.        Marland Kitchen atenolol (TENORMIN) 100 MG tablet Take 100 mg by mouth daily.       Marland Kitchen atorvastatin (LIPITOR) 10 MG tablet Take 10 mg by mouth daily.        . clidinium-chlordiazePOXIDE (LIBRAX) 2.5-5 MG per capsule Take 1 capsule by mouth 3 (three) times daily as needed. For IBS symptoms      . diphenhydrAMINE (BENADRYL) 25 MG tablet Take 25 mg by mouth at bedtime.       . docusate sodium (COLACE) 100 MG capsule Take 100 mg by mouth 2 (two) times daily as needed.       Marland Kitchen ibuprofen (ADVIL,MOTRIN) 200 MG tablet Take 200 mg by mouth every 8 (eight) hours as needed. For pain       . levothyroxine (SYNTHROID, LEVOTHROID) 112 MCG tablet Take 112 mcg by mouth daily.      . Melatonin 3 MG CAPS Take by mouth daily.        .  nitroGLYCERIN (NITROSTAT) 0.4 MG SL tablet Place 0.4 mg under the tongue every 5 (five) minutes as needed. For chest pain      . omeprazole (PRILOSEC) 20 MG capsule Take 20 mg by mouth daily.        . Polyethylene Glycol 3350 (MIRALAX PO) Take by mouth as needed.       Marland Kitchen PROTOPIC 0.1 % ointment Apply 1 application topically daily as needed. For psoriasis      . Psyllium (METAMUCIL PO) Take by mouth as needed.       Gerarda Fraction Root 450 MG CAPS Take 450 mg by mouth as needed.        No current facility-administered medications for this visit.     Allergies  Allergen Reactions  . Meperidine Hcl     REACTION: severe headache  . Pseudoephedrine Other (See Comments)    Hypertension   . Demerol Other (See Comments)    Severe headache.  . Sulfa Antibiotics Other (See Comments)    Happened when he was child. Does  not remember reaction.     Family History  Problem Relation Age of Onset  . Hypertension Mother   . Heart failure Mother   . Heart disease Mother   . Peripheral vascular disease Father   . Lung cancer Father   . Cancer Father     lung  . Hypertension Sister   . Colon cancer Neg Hx      History   Social History  . Marital Status: Married    Spouse Name: N/A    Number of Children: N/A  . Years of Education: N/A   Social History Main Topics  . Smoking status: Former Smoker    Quit date: 10/26/1983  . Smokeless tobacco: Never Used  . Alcohol Use: 8.4 oz/week    14 Cans of beer per week  . Drug Use: No  . Sexual Activity: Not on file    REVIEW OF SYSTEMS - PERTINENT POSITIVES ONLY: 12 point review of systems negative other than HPI and PMH except for hearing loss.    EXAM: Wt170, BP 138/82, P 72  Gen:  No acute distress.  Well nourished and well groomed.   Neurological: Alert and oriented to person, place, and time. Coordination normal.  Head: Normocephalic and atraumatic.  Eyes: Conjunctivae are normal. Pupils are equal, round, and reactive to light. No  scleral icterus.  Neck: Normal range of motion. Neck supple. No tracheal deviation or thyromegaly present.  5 cm mass on left posterior neck at neck line.   Cardiovascular: Normal rate, regular rhythm Respiratory: Effort normal.  No respiratory distress. No chest wall tenderness. Lymphadenopathy: No cervical, preauricular, postauricular or axillary adenopathy is present Skin: Skin is warm and dry. No rash noted. No diaphoresis. No erythema. No pallor. No clubbing, cyanosis, or edema.   Psychiatric: Normal mood and affect. Behavior is normal. Judgment and thought content normal.    LABORATORY RESULTS: Available labs are reviewed  n/a  RADIOLOGY RESULTS: See E-Chart or I-Site for most recent results.  Images and reports are reviewed. n/a   ASSESSMENT AND PLAN: Posterior neck lipoma The patient has an enlarging 5 cm left posterior neck lipoma. Given the fact that the large and sore, we'll plan to remove it. I advised the patient that I would have to make an incision large enough to remove the mass. I advised him that there is a small chance I may leave a drain.    I discussed the risk of bleeding and infection. He is going to work with his wife scheduled to get this on the operating room schedule. I would do this with MAC and local.  We'll do this at the first mutually convenient time.   Maudry Diego MD Surgical Oncology, General and Endocrine Surgery [facility]      Visit Diagnoses: No diagnosis found.  Primary Care Physician: Enrique Sack, MD

## 2013-10-17 NOTE — Patient Instructions (Addendum)
Main risks are bleeding, infection, and wound issues.    You will have restrictions for 1 week.

## 2013-11-10 ENCOUNTER — Encounter (HOSPITAL_BASED_OUTPATIENT_CLINIC_OR_DEPARTMENT_OTHER)
Admission: RE | Admit: 2013-11-10 | Discharge: 2013-11-10 | Disposition: A | Discharge status: Home / Self Care | Payer: 59 | Source: Ambulatory Visit | Attending: General Surgery | Admitting: General Surgery

## 2013-11-10 DIAGNOSIS — Z01818 Encounter for other preprocedural examination: Secondary | ICD-10-CM | POA: Insufficient documentation

## 2013-11-10 DIAGNOSIS — Z01812 Encounter for preprocedural laboratory examination: Secondary | ICD-10-CM | POA: Insufficient documentation

## 2013-11-10 LAB — BASIC METABOLIC PANEL
CO2: 26 mEq/L (ref 19–32)
Calcium: 10.7 mg/dL — ABNORMAL HIGH (ref 8.4–10.5)
Creatinine, Ser: 1.19 mg/dL (ref 0.50–1.35)
GFR calc Af Amer: 72 mL/min — ABNORMAL LOW (ref >90)
Glucose, Bld: 98 mg/dL (ref 70–99)
Potassium: 4.1 mEq/L (ref 3.5–5.1)
Sodium: 140 mEq/L (ref 135–145)

## 2013-11-11 ENCOUNTER — Encounter (HOSPITAL_BASED_OUTPATIENT_CLINIC_OR_DEPARTMENT_OTHER): Payer: Self-pay | Admitting: *Deleted

## 2013-11-11 NOTE — Progress Notes (Signed)
Came in for bmet-had ekg dr Riley Kill 3/14-echo 3/14 good

## 2013-11-18 ENCOUNTER — Ambulatory Visit (HOSPITAL_BASED_OUTPATIENT_CLINIC_OR_DEPARTMENT_OTHER): Payer: Medicare Other | Admitting: Anesthesiology

## 2013-11-18 ENCOUNTER — Ambulatory Visit (HOSPITAL_BASED_OUTPATIENT_CLINIC_OR_DEPARTMENT_OTHER)
Admission: RE | Admit: 2013-11-18 | Discharge: 2013-11-18 | Disposition: A | Discharge status: Home / Self Care | Payer: Medicare Other | Source: Ambulatory Visit | Attending: General Surgery | Admitting: General Surgery

## 2013-11-18 ENCOUNTER — Encounter (HOSPITAL_BASED_OUTPATIENT_CLINIC_OR_DEPARTMENT_OTHER)
Admission: RE | Disposition: A | Discharge status: Home / Self Care | Payer: Self-pay | Source: Ambulatory Visit | Attending: General Surgery

## 2013-11-18 ENCOUNTER — Telehealth (INDEPENDENT_AMBULATORY_CARE_PROVIDER_SITE_OTHER): Payer: Self-pay

## 2013-11-18 ENCOUNTER — Encounter (HOSPITAL_BASED_OUTPATIENT_CLINIC_OR_DEPARTMENT_OTHER): Payer: Self-pay | Admitting: *Deleted

## 2013-11-18 ENCOUNTER — Encounter (HOSPITAL_BASED_OUTPATIENT_CLINIC_OR_DEPARTMENT_OTHER): Payer: Medicare Other | Admitting: Anesthesiology

## 2013-11-18 DIAGNOSIS — I2510 Atherosclerotic heart disease of native coronary artery without angina pectoris: Secondary | ICD-10-CM | POA: Insufficient documentation

## 2013-11-18 DIAGNOSIS — Z01812 Encounter for preprocedural laboratory examination: Secondary | ICD-10-CM | POA: Insufficient documentation

## 2013-11-18 DIAGNOSIS — E785 Hyperlipidemia, unspecified: Secondary | ICD-10-CM | POA: Insufficient documentation

## 2013-11-18 DIAGNOSIS — I10 Essential (primary) hypertension: Secondary | ICD-10-CM | POA: Insufficient documentation

## 2013-11-18 DIAGNOSIS — I739 Peripheral vascular disease, unspecified: Secondary | ICD-10-CM | POA: Insufficient documentation

## 2013-11-18 DIAGNOSIS — K219 Gastro-esophageal reflux disease without esophagitis: Secondary | ICD-10-CM | POA: Insufficient documentation

## 2013-11-18 DIAGNOSIS — K589 Irritable bowel syndrome without diarrhea: Secondary | ICD-10-CM | POA: Insufficient documentation

## 2013-11-18 DIAGNOSIS — H9190 Unspecified hearing loss, unspecified ear: Secondary | ICD-10-CM | POA: Insufficient documentation

## 2013-11-18 DIAGNOSIS — D1779 Benign lipomatous neoplasm of other sites: Secondary | ICD-10-CM | POA: Insufficient documentation

## 2013-11-18 DIAGNOSIS — L408 Other psoriasis: Secondary | ICD-10-CM | POA: Insufficient documentation

## 2013-11-18 DIAGNOSIS — D1739 Benign lipomatous neoplasm of skin and subcutaneous tissue of other sites: Secondary | ICD-10-CM

## 2013-11-18 DIAGNOSIS — C649 Malignant neoplasm of unspecified kidney, except renal pelvis: Secondary | ICD-10-CM | POA: Insufficient documentation

## 2013-11-18 HISTORY — DX: Presence of external hearing-aid: Z97.4

## 2013-11-18 HISTORY — DX: Presence of spectacles and contact lenses: Z97.3

## 2013-11-18 HISTORY — DX: Anxiety disorder, unspecified: F41.9

## 2013-11-18 HISTORY — PX: LIPOMA EXCISION: SHX5283

## 2013-11-18 LAB — POCT HEMOGLOBIN-HEMACUE: Hemoglobin: 15.5 g/dL (ref 13.0–17.0)

## 2013-11-18 SURGERY — EXCISION LIPOMA
Anesthesia: General | Site: Neck | Laterality: Left | Wound class: Clean

## 2013-11-18 MED ORDER — SUCCINYLCHOLINE CHLORIDE 20 MG/ML IJ SOLN
INTRAMUSCULAR | Status: AC
Start: 2013-11-18 — End: 2013-11-18
  Filled 2013-11-18: qty 1

## 2013-11-18 MED ORDER — SUCCINYLCHOLINE CHLORIDE 20 MG/ML IJ SOLN
INTRAMUSCULAR | Status: DC | PRN
  Administered 2013-11-18: 100 mg via INTRAVENOUS

## 2013-11-18 MED ORDER — ACETAMINOPHEN 325 MG PO TABS: 650.0000 mg | ORAL_TABLET | ORAL | Status: DC | PRN

## 2013-11-18 MED ORDER — PROPOFOL 10 MG/ML IV BOLUS
INTRAVENOUS | Status: DC | PRN
  Administered 2013-11-18: 200 mg via INTRAVENOUS

## 2013-11-18 MED ORDER — OXYCODONE HCL 5 MG PO TABS: 5.0000 mg | ORAL_TABLET | ORAL | Status: DC | PRN

## 2013-11-18 MED ORDER — ONDANSETRON HCL 4 MG/2ML IJ SOLN
INTRAMUSCULAR | Status: DC | PRN
  Administered 2013-11-18: 4 mg via INTRAVENOUS

## 2013-11-18 MED ORDER — LIDOCAINE HCL (CARDIAC) 20 MG/ML IV SOLN
INTRAVENOUS | Status: DC | PRN
  Administered 2013-11-18: 50 mg via INTRAVENOUS

## 2013-11-18 MED ORDER — LACTATED RINGERS IV SOLN
INTRAVENOUS | Status: DC
  Administered 2013-11-18: 07:00:00 via INTRAVENOUS

## 2013-11-18 MED ORDER — BUPIVACAINE HCL (PF) 0.25 % IJ SOLN
INTRAMUSCULAR | Status: AC
  Filled 2013-11-18: qty 30

## 2013-11-18 MED ORDER — MIDAZOLAM HCL 2 MG/2ML IJ SOLN: 1.0000 mg | INTRAMUSCULAR | Status: DC | PRN

## 2013-11-18 MED ORDER — FENTANYL CITRATE 0.05 MG/ML IJ SOLN
INTRAMUSCULAR | Status: AC
  Filled 2013-11-18: qty 2

## 2013-11-18 MED ORDER — FENTANYL CITRATE 0.05 MG/ML IJ SOLN: 50.0000 ug | INTRAMUSCULAR | Status: DC | PRN

## 2013-11-18 MED ORDER — CEFAZOLIN SODIUM-DEXTROSE 2-3 GM-% IV SOLR
2.0000 g | INTRAVENOUS | Status: AC
  Administered 2013-11-18: 2 g via INTRAVENOUS

## 2013-11-18 MED ORDER — FENTANYL CITRATE 0.05 MG/ML IJ SOLN
INTRAMUSCULAR | Status: DC | PRN
  Administered 2013-11-18: 100 ug via INTRAVENOUS

## 2013-11-18 MED ORDER — SODIUM CHLORIDE 0.9 % IV SOLN: 250.0000 mL | INTRAVENOUS | Status: DC | PRN

## 2013-11-18 MED ORDER — METOCLOPRAMIDE HCL 5 MG/ML IJ SOLN: 10.0000 mg | Freq: Once | INTRAMUSCULAR | Status: DC | PRN

## 2013-11-18 MED ORDER — LIDOCAINE-EPINEPHRINE (PF) 1 %-1:200000 IJ SOLN
INTRAMUSCULAR | Status: AC
  Filled 2013-11-18: qty 10

## 2013-11-18 MED ORDER — MIDAZOLAM HCL 5 MG/5ML IJ SOLN
INTRAMUSCULAR | Status: DC | PRN
  Administered 2013-11-18: 2 mg via INTRAVENOUS

## 2013-11-18 MED ORDER — BUPIVACAINE HCL (PF) 0.25 % IJ SOLN
INTRAMUSCULAR | Status: DC | PRN
  Administered 2013-11-18: 19 mL

## 2013-11-18 MED ORDER — OXYCODONE HCL 5 MG PO TABS
ORAL_TABLET | ORAL | Status: AC
  Filled 2013-11-18: qty 1

## 2013-11-18 MED ORDER — CHLORHEXIDINE GLUCONATE 4 % EX LIQD: 1.0000 "application " | Freq: Once | CUTANEOUS | Status: DC

## 2013-11-18 MED ORDER — SODIUM CHLORIDE 0.9 % IJ SOLN: 3.0000 mL | INTRAMUSCULAR | Status: DC | PRN

## 2013-11-18 MED ORDER — OXYCODONE-ACETAMINOPHEN 5-325 MG PO TABS: 1.0000 | ORAL_TABLET | ORAL | Status: DC | PRN

## 2013-11-18 MED ORDER — PROPOFOL 10 MG/ML IV BOLUS
INTRAVENOUS | Status: AC
  Filled 2013-11-18: qty 20

## 2013-11-18 MED ORDER — MIDAZOLAM HCL 2 MG/2ML IJ SOLN
INTRAMUSCULAR | Status: AC
  Filled 2013-11-18: qty 4

## 2013-11-18 MED ORDER — DEXAMETHASONE SODIUM PHOSPHATE 4 MG/ML IJ SOLN
INTRAMUSCULAR | Status: DC | PRN
  Administered 2013-11-18: 10 mg via INTRAVENOUS

## 2013-11-18 MED ORDER — SODIUM CHLORIDE 0.9 % IJ SOLN: 3.0000 mL | Freq: Two times a day (BID) | INTRAMUSCULAR | Status: DC

## 2013-11-18 MED ORDER — CEFAZOLIN SODIUM-DEXTROSE 2-3 GM-% IV SOLR
INTRAVENOUS | Status: AC
  Filled 2013-11-18: qty 50

## 2013-11-18 MED ORDER — OXYCODONE HCL 5 MG/5ML PO SOLN: 5.0000 mg | Freq: Once | ORAL | Status: AC | PRN

## 2013-11-18 MED ORDER — OXYCODONE HCL 5 MG PO TABS
5.0000 mg | ORAL_TABLET | Freq: Once | ORAL | Status: AC | PRN
  Administered 2013-11-18: 5 mg via ORAL

## 2013-11-18 MED ORDER — ONDANSETRON HCL 4 MG/2ML IJ SOLN
4.0000 mg | Freq: Four times a day (QID) | INTRAMUSCULAR | Status: DC | PRN
Start: 2013-11-18 — End: 2013-11-18

## 2013-11-18 MED ORDER — HYDROMORPHONE HCL PF 1 MG/ML IJ SOLN: 0.2500 mg | INTRAMUSCULAR | Status: DC | PRN

## 2013-11-18 MED ORDER — ACETAMINOPHEN 650 MG RE SUPP: 650.0000 mg | RECTAL | Status: DC | PRN

## 2013-11-18 SURGICAL SUPPLY — 48 items
ADH SKN CLS APL DERMABOND .7 (GAUZE/BANDAGES/DRESSINGS) ×1
BLADE HEX COATED 2.75 (ELECTRODE) ×2 IMPLANT
BLADE SURG 15 STRL LF DISP TIS (BLADE) ×1 IMPLANT
BLADE SURG 15 STRL SS (BLADE) ×2
CANISTER SUCT 1200ML W/VALVE (MISCELLANEOUS) IMPLANT
CHLORAPREP W/TINT 26ML (MISCELLANEOUS) ×2 IMPLANT
COVER MAYO STAND STRL (DRAPES) ×2 IMPLANT
COVER TABLE BACK 60X90 (DRAPES) ×2 IMPLANT
DECANTER SPIKE VIAL GLASS SM (MISCELLANEOUS) IMPLANT
DERMABOND ADVANCED (GAUZE/BANDAGES/DRESSINGS) ×1
DERMABOND ADVANCED .7 DNX12 (GAUZE/BANDAGES/DRESSINGS) ×1 IMPLANT
DRAIN CHANNEL 10F 3/8 F FF (DRAIN) IMPLANT
DRAIN JP 10F RND SILICONE (MISCELLANEOUS) ×1 IMPLANT
DRAPE PED LAPAROTOMY (DRAPES) ×2 IMPLANT
DRAPE UTILITY XL STRL (DRAPES) ×2 IMPLANT
DRSG TEGADERM 4X4.75 (GAUZE/BANDAGES/DRESSINGS) ×1 IMPLANT
ELECT REM PT RETURN 9FT ADLT (ELECTROSURGICAL) ×2
ELECTRODE REM PT RTRN 9FT ADLT (ELECTROSURGICAL) ×1 IMPLANT
EVACUATOR SILICONE 100CC (DRAIN) ×1 IMPLANT
GAUZE SPONGE 4X4 12PLY STRL LF (GAUZE/BANDAGES/DRESSINGS) ×2 IMPLANT
GLOVE BIO SURGEON STRL SZ 6 (GLOVE) ×2 IMPLANT
GLOVE BIOGEL PI IND STRL 6.5 (GLOVE) ×1 IMPLANT
GLOVE BIOGEL PI IND STRL 7.0 (GLOVE) IMPLANT
GLOVE BIOGEL PI INDICATOR 6.5 (GLOVE) ×1
GLOVE BIOGEL PI INDICATOR 7.0 (GLOVE) ×1
GLOVE SURG SS PI 7.0 STRL IVOR (GLOVE) ×1 IMPLANT
GOWN PREVENTION PLUS XLARGE (GOWN DISPOSABLE) ×2 IMPLANT
GOWN PREVENTION PLUS XXLARGE (GOWN DISPOSABLE) ×2 IMPLANT
NDL HYPO 25X1 1.5 SAFETY (NEEDLE) ×1 IMPLANT
NEEDLE HYPO 25X1 1.5 SAFETY (NEEDLE) ×2 IMPLANT
NS IRRIG 1000ML POUR BTL (IV SOLUTION) IMPLANT
PACK BASIN DAY SURGERY FS (CUSTOM PROCEDURE TRAY) ×2 IMPLANT
PENCIL BUTTON HOLSTER BLD 10FT (ELECTRODE) ×2 IMPLANT
SLEEVE SCD COMPRESS KNEE MED (MISCELLANEOUS) ×1 IMPLANT
SPONGE LAP 4X18 X RAY DECT (DISPOSABLE) ×2 IMPLANT
STAPLER VISISTAT 35W (STAPLE) IMPLANT
SUT MNCRL AB 4-0 PS2 18 (SUTURE) ×2 IMPLANT
SUT SILK 2 0 FS (SUTURE) ×1 IMPLANT
SUT SILK 3 0 TIES 17X18 (SUTURE)
SUT SILK 3-0 18XBRD TIE BLK (SUTURE) IMPLANT
SUT VIC AB 3-0 SH 27 (SUTURE)
SUT VIC AB 3-0 SH 27X BRD (SUTURE) ×1 IMPLANT
SUT VICRYL 4-0 PS2 18IN ABS (SUTURE) ×1 IMPLANT
SYR CONTROL 10ML LL (SYRINGE) ×2 IMPLANT
TOWEL OR 17X24 6PK STRL BLUE (TOWEL DISPOSABLE) ×2 IMPLANT
TOWEL OR NON WOVEN STRL DISP B (DISPOSABLE) ×1 IMPLANT
TUBE CONNECTING 20X1/4 (TUBING) IMPLANT
YANKAUER SUCT BULB TIP NO VENT (SUCTIONS) IMPLANT

## 2013-11-18 NOTE — Telephone Encounter (Signed)
LM on cell phone.  Pt has been scheduled to come in for po on 11/28/13 at 8:45a with Dr. Donell Beers.  She will determine whether he needs to come back on the 19th as previously scheduled.

## 2013-11-18 NOTE — H&P (Signed)
Posterior neck lipoma     HISTORY:   Patient is a 67 year old male he is around 2 years status post resection of focal nodular hyperplasia of the liver. He has been doing very well except for an issue with a lipoma on his posterior neck. This has been enlarging. It also is sometimes sore. It is right over his trapezius. He denies any drainage or infection. He has not felt any hard portions of it.  He has not further medical issues since I saw him last.      Past Medical History   Diagnosis  Date   .  Coronary artery disease     .  Hyperlipidemia     .  PVD (peripheral vascular disease)     .  Renal cell cancer     .  GERD (gastroesophageal reflux disease)     .  IBS (irritable bowel syndrome)     .  Hypertension     .  Hearing loss     .  Rash     .  Eczema     .  Psoriasis           Past Surgical History   Procedure  Laterality  Date   .  Tonsillectomy       .  Finger surgery    67 y/o   .  Colonoscopy       .  Coronary stent placement    2006   .  Nephrectomy    2008       right   .  Liver biopsy    09/18/11   .  Liver lobectomy    10/26/11   .  Cholecystectomy    10/26/11         Current Outpatient Prescriptions   Medication  Sig  Dispense  Refill   .  ALPRAZolam (XANAX) 0.5 MG tablet  Take 0.5 mg by mouth 3 (three) times daily as needed. For anxiety         .  aspirin 81 MG tablet  Take 81 mg by mouth daily.           Marland Kitchen  atenolol (TENORMIN) 100 MG tablet  Take 100 mg by mouth daily.          Marland Kitchen  atorvastatin (LIPITOR) 10 MG tablet  Take 10 mg by mouth daily.           .  clidinium-chlordiazePOXIDE (LIBRAX) 2.5-5 MG per capsule  Take 1 capsule by mouth 3 (three) times daily as needed. For IBS symptoms         .  diphenhydrAMINE (BENADRYL) 25 MG tablet  Take 25 mg by mouth at bedtime.          .  docusate sodium (COLACE) 100 MG capsule  Take 100 mg by mouth 2 (two) times daily as needed.          Marland Kitchen  ibuprofen (ADVIL,MOTRIN) 200 MG tablet  Take 200 mg by mouth every  8 (eight) hours as needed. For pain          .  levothyroxine (SYNTHROID, LEVOTHROID) 112 MCG tablet  Take 112 mcg by mouth daily.         .  Melatonin 3 MG CAPS  Take by mouth daily.           .  nitroGLYCERIN (NITROSTAT) 0.4 MG SL tablet  Place 0.4 mg under the tongue every 5 (five) minutes as needed. For chest pain         .  omeprazole (PRILOSEC) 20 MG capsule  Take 20 mg by mouth daily.           .  Polyethylene Glycol 3350 (MIRALAX PO)  Take by mouth as needed.          Marland Kitchen  PROTOPIC 0.1 % ointment  Apply 1 application topically daily as needed. For psoriasis         .  Psyllium (METAMUCIL PO)  Take by mouth as needed.          Gerarda Fraction Root 450 MG CAPS  Take 450 mg by mouth as needed.              No current facility-administered medications for this visit.           Allergies   Allergen  Reactions   .  Meperidine Hcl         REACTION: severe headache   .  Pseudoephedrine  Other (See Comments)       Hypertension    .  Demerol  Other (See Comments)       Severe headache.   .  Sulfa Antibiotics  Other (See Comments)       Happened when he was child. Does not remember reaction.           Family History   Problem  Relation  Age of Onset   .  Hypertension  Mother     .  Heart failure  Mother     .  Heart disease  Mother     .  Peripheral vascular disease  Father     .  Lung cancer  Father     .  Cancer  Father         lung   .  Hypertension  Sister     .  Colon cancer  Neg Hx             History       Social History   .  Marital Status:  Married       Spouse Name:  N/A       Number of Children:  N/A   .  Years of Education:  N/A       Social History Main Topics   .  Smoking status:  Former Smoker       Quit date:  10/26/1983   .  Smokeless tobacco:  Never Used   .  Alcohol Use:  8.4 oz/week       14 Cans of beer per week   .  Drug Use:  No   .  Sexual Activity:  Not on file        REVIEW OF SYSTEMS - PERTINENT POSITIVES ONLY: 12 point  review of systems negative other than HPI and PMH except for hearing loss.     EXAM: Wt170, BP 138/82, P 72   Gen:  No acute distress.  Well nourished and well groomed.   Neurological: Alert and oriented to person, place, and time. Coordination normal.  Head: Normocephalic and atraumatic.    Neck: Normal range of motion. Neck supple. No tracheal deviation or thyromegaly present.  5 cm mass on left posterior neck at neck line.    Cardiovascular: Normal rate, regular rhythm Respiratory: Effort normal.  No respiratory distress. No chest wall tenderness. Skin: Skin is warm and dry. No rash noted. No diaphoresis. No erythema. No pallor. No clubbing, cyanosis, or edema.   Psychiatric: Normal mood and affect.  Behavior is normal. Judgment and thought content normal.     ASSESSMENT AND PLAN: Posterior neck lipoma The patient has an enlarging 5 cm left posterior neck lipoma. Given the fact that the large and sore, we'll plan to remove it. I advised the patient that I would have to make an incision large enough to remove the mass. I advised him that there is a small chance I may leave a drain.     I discussed the risk of bleeding and infection. He is going to work with his wife scheduled to get this on the operating room schedule. I would do this with MAC and local.        Maudry Diego MD Surgical Oncology, General and Endocrine Surgery [facility]           Visit Diagnoses: No diagnosis found.   Primary Care Physician: Enrique Sack, MD

## 2013-11-18 NOTE — Op Note (Signed)
PRE-OPERATIVE DIAGNOSIS: posterior neck mass  POST-OPERATIVE DIAGNOSIS:  Same  PROCEDURE:  Procedure(s): Excision of posterior neck mass (4 cm, some subfascial)  SURGEON:  Surgeon(s): Almond Lint, MD  ANESTHESIA:   local and MAC  DRAINS: (10 Fr) Blake drain(s) in the posterior neck   LOCAL MEDICATIONS USED:  MARCAINE    and LIDOCAINE   SPECIMEN:  Source of Specimen:  posterior neck mass  DISPOSITION OF SPECIMEN:  PATHOLOGY  COUNTS:  YES  DICTATION: .Note written in EPIC  PLAN OF CARE: Discharge to home after PACU  PATIENT DISPOSITION:  PACU - hemodynamically stable.   EBL:  MINIMAL  FINDINGS:  Fatty mass consistent with lipoma  PROCEDURE:  Pt was identified in the holding area and taken to the OR.  He was placed on the OR table and MAC anesthesia was induced.  He was then placed into the lateral position.  The posterior neck was prepped and draped in sterile fashion.  Time out was performed according to the surgical safety checklist.    A obliquely oriented transverse incision was marked over the incision.  The skin was infiltrated with local.  The skin was incised with a #15 blade.  The skin was elevated with Senn retractors.  The mass was excised with the cautery.  The left lateral aspect of the mass was into the trapezius muscle.    The mass was removed, then the muscle was cauterized.  A 10 Fr blake drain was placed and secured with a 2-0 silk.  The cavity was reinspected for hemostasis.  Additional local was injected.  The skin was then closed with 3-0 vicryl deep dermal sutures and 4-0 monocryl running subcuticular suture.    The wound was cleaned, dried, and dressed with dermabond.    The patient was awakened from anesthesia and taken to PACU in stable condition.  Needle, sponge, and instrument counts were correct.

## 2013-11-18 NOTE — Anesthesia Preprocedure Evaluation (Signed)
Anesthesia Evaluation  Patient identified by MRN, date of birth, ID band Patient awake    Reviewed: Allergy & Precautions, H&P , NPO status , Patient's Chart, lab work & pertinent test results, reviewed documented beta blocker date and time   Airway Mallampati: II TM Distance: >3 FB Neck ROM: full    Dental   Pulmonary former smoker,  breath sounds clear to auscultation        Cardiovascular hypertension, Pt. on medications and Pt. on home beta blockers + CAD and + Peripheral Vascular Disease Rhythm:regular     Neuro/Psych PSYCHIATRIC DISORDERS Anxiety negative neurological ROS     GI/Hepatic Neg liver ROS, GERD-  Medicated and Controlled,  Endo/Other  negative endocrine ROS  Renal/GU Renal disease  negative genitourinary   Musculoskeletal   Abdominal   Peds  Hematology negative hematology ROS (+)   Anesthesia Other Findings See surgeon's H&P   Reproductive/Obstetrics negative OB ROS                           Anesthesia Physical Anesthesia Plan  ASA: III  Anesthesia Plan: General   Post-op Pain Management:    Induction: Intravenous  Airway Management Planned: Oral ETT  Additional Equipment:   Intra-op Plan:   Post-operative Plan: Extubation in OR  Informed Consent: I have reviewed the patients History and Physical, chart, labs and discussed the procedure including the risks, benefits and alternatives for the proposed anesthesia with the patient or authorized representative who has indicated his/her understanding and acceptance.   Dental Advisory Given  Plan Discussed with: CRNA and Surgeon  Anesthesia Plan Comments:         Anesthesia Quick Evaluation

## 2013-11-18 NOTE — Anesthesia Postprocedure Evaluation (Signed)
Anesthesia Post Note  Patient: Bradley Irwin  Procedure(s) Performed: Procedure(s) (LRB): EXCISION POSTERIOR NECK LIPOMA  (Left)  Anesthesia type: General  Patient location: PACU  Post pain: Pain level controlled  Post assessment: Patient's Cardiovascular Status Stable  Last Vitals:  Filed Vitals:   11/18/13 0915  BP: 132/77  Pulse: 70  Temp:   Resp: 13    Post vital signs: Reviewed and stable  Level of consciousness: alert  Complications: No apparent anesthesia complications

## 2013-11-18 NOTE — Anesthesia Procedure Notes (Signed)
Procedure Name: Intubation Performed by: Lance Coon Pre-anesthesia Checklist: Patient identified, Emergency Drugs available, Suction available and Patient being monitored Patient Re-evaluated:Patient Re-evaluated prior to inductionOxygen Delivery Method: Circle System Utilized Preoxygenation: Pre-oxygenation with 100% oxygen Intubation Type: IV induction Ventilation: Mask ventilation without difficulty Laryngoscope Size: Mac and 3 Grade View: Grade I Tube type: Oral Number of attempts: 1 Airway Equipment and Method: stylet and oral airway Placement Confirmation: ETT inserted through vocal cords under direct vision,  positive ETCO2 and breath sounds checked- equal and bilateral Tube secured with: Tape Dental Injury: Teeth and Oropharynx as per pre-operative assessment

## 2013-11-18 NOTE — Transfer of Care (Signed)
Immediate Anesthesia Transfer of Care Note  Patient: Bradley Irwin  Procedure(s) Performed: Procedure(s): EXCISION POSTERIOR NECK LIPOMA  (Left)  Patient Location: PACU  Anesthesia Type:General  Level of Consciousness: awake and patient cooperative  Airway & Oxygen Therapy: Patient Spontanous Breathing and Patient connected to face mask oxygen  Post-op Assessment: Report given to PACU RN and Post -op Vital signs reviewed and stable  Post vital signs: Reviewed and stable  Complications: No apparent anesthesia complications

## 2013-11-19 ENCOUNTER — Encounter (HOSPITAL_BASED_OUTPATIENT_CLINIC_OR_DEPARTMENT_OTHER): Payer: Self-pay | Admitting: General Surgery

## 2013-11-25 ENCOUNTER — Telehealth (INDEPENDENT_AMBULATORY_CARE_PROVIDER_SITE_OTHER): Payer: Self-pay

## 2013-11-25 NOTE — Progress Notes (Signed)
Quick Note:  Please let pt know that his path is benign. Mass was lipoma like we thought. ______

## 2013-11-25 NOTE — Telephone Encounter (Signed)
Pt made aware of benign pathology results.  Lipoma as expected.

## 2013-11-28 ENCOUNTER — Encounter (INDEPENDENT_AMBULATORY_CARE_PROVIDER_SITE_OTHER): Payer: Self-pay

## 2013-11-28 ENCOUNTER — Encounter (INDEPENDENT_AMBULATORY_CARE_PROVIDER_SITE_OTHER): Payer: Self-pay | Admitting: General Surgery

## 2013-11-28 ENCOUNTER — Ambulatory Visit (INDEPENDENT_AMBULATORY_CARE_PROVIDER_SITE_OTHER): Payer: Medicare Other | Admitting: General Surgery

## 2013-11-28 VITALS — BP 112/68 | HR 68 | Temp 97.8°F | Resp 14 | Ht 66.0 in | Wt 163.0 lb

## 2013-11-28 DIAGNOSIS — D1739 Benign lipomatous neoplasm of skin and subcutaneous tissue of other sites: Secondary | ICD-10-CM

## 2013-11-28 DIAGNOSIS — D170 Benign lipomatous neoplasm of skin and subcutaneous tissue of head, face and neck: Secondary | ICD-10-CM | POA: Insufficient documentation

## 2013-11-28 NOTE — Patient Instructions (Signed)
Remove dressing tomorrow.  OK to shower.  Activity as tolerated.  Follow up as needed.    Have a great holiday!

## 2013-11-28 NOTE — Progress Notes (Signed)
HISTORY: Patient is doing well status post excision of neck lipoma. He had a drain has been putting out about 3-5 cc per day. He denies any significant pain. He only took 2 pain pills. He is not having any fevers and chills.    EXAM: General:  Alert and oriented x3 Incision:  Well healed. Scant output and drain. Drain is removed. No evidence of erythema or drainage.   PATHOLOGY: Soft tissue mass, simple excision, Neck posterior - MATURE ADIPOSE TISSUE CONSISTENT WITH LIPOMA. - NO ATYPIA OR MALIGNANCY IDENTIFIED.   ASSESSMENT AND PLAN:   Lipoma of neck No evidence of surgical complications.  Keep appointment in 2 weeks in case seroma buildup.  He will call and cancel if he is doing fine.      Maudry Diego, MD Surgical Oncology, General & Endocrine Surgery [facility]  GREEN, Lorenda Ishihara, MD Enrique Sack, MD

## 2013-11-28 NOTE — Assessment & Plan Note (Signed)
No evidence of surgical complications.  Keep appointment in 2 weeks in case seroma buildup.  He will call and cancel if he is doing fine.

## 2013-12-08 ENCOUNTER — Encounter (INDEPENDENT_AMBULATORY_CARE_PROVIDER_SITE_OTHER): Payer: Medicare Other | Admitting: General Surgery

## 2013-12-12 ENCOUNTER — Encounter (INDEPENDENT_AMBULATORY_CARE_PROVIDER_SITE_OTHER): Payer: Medicare Other | Admitting: General Surgery

## 2013-12-31 ENCOUNTER — Other Ambulatory Visit: Payer: Self-pay | Admitting: Dermatology

## 2014-01-14 ENCOUNTER — Other Ambulatory Visit: Payer: Self-pay | Admitting: Dermatology

## 2014-03-16 ENCOUNTER — Encounter: Payer: Self-pay | Admitting: Cardiovascular Disease

## 2014-03-16 ENCOUNTER — Ambulatory Visit (INDEPENDENT_AMBULATORY_CARE_PROVIDER_SITE_OTHER): Payer: Medicare Other | Admitting: Cardiovascular Disease

## 2014-03-16 VITALS — BP 146/86 | HR 62 | Ht 66.0 in | Wt 163.0 lb

## 2014-03-16 DIAGNOSIS — I10 Essential (primary) hypertension: Secondary | ICD-10-CM

## 2014-03-16 DIAGNOSIS — I2510 Atherosclerotic heart disease of native coronary artery without angina pectoris: Secondary | ICD-10-CM

## 2014-03-16 DIAGNOSIS — E785 Hyperlipidemia, unspecified: Secondary | ICD-10-CM

## 2014-03-16 MED ORDER — CLOPIDOGREL BISULFATE 75 MG PO TABS: 75.0000 mg | ORAL_TABLET | Freq: Every day | ORAL | Status: DC

## 2014-03-16 MED ORDER — NITROGLYCERIN 0.4 MG SL SUBL: 0.4000 mg | SUBLINGUAL_TABLET | SUBLINGUAL | Status: DC | PRN

## 2014-03-16 NOTE — Patient Instructions (Signed)
Your physician wants you to follow-up in:  12 months.  You will receive a reminder letter in the mail two months in advance. If you don't receive a letter, please call our office to schedule the follow-up appointment.  Your physician has recommended you make the following change in your medication: Start Clopidogrel 75 mg by mouth daily.

## 2014-03-16 NOTE — Progress Notes (Signed)
History of Present Illness: 68 yo male with history of CAD, HTN, HLD, renal cell carcinoma s/p right nephrectomy here today for cardiac follow up. He has been followed in the past by Dr. Lia Foyer and I am meeting him for the first time today. He had a 2.75 x 28 mm Costar study stent (DES) in the mid LAD in February 2006. Last cath November 2006 with patent mid LAD stent, 40% proximal LAD stenosis, 80% small diagonal branch stenosis, minimal disease in the Circumflex and RCA. Echo 03/18/13 with normal LV function, mild LVH.   He is doing well. No chest pain or SOB.   Primary Care Physician: [name]   Last Lipid Profile: Followed in primary care.   Past Medical History  Diagnosis Date  . Coronary artery disease   . Hyperlipidemia   . PVD (peripheral vascular disease)   . Renal cell cancer   . GERD (gastroesophageal reflux disease)   . IBS (irritable bowel syndrome)   . Hypertension   . Hearing loss   . Rash   . Eczema   . Psoriasis   . Anxiety   . Wears hearing aid     both ears  . Wears glasses     Past Surgical History  Procedure Laterality Date  . Tonsillectomy    . Finger surgery  68 y/o    cut index rt  . Colonoscopy    . Coronary stent placement  2006  . Nephrectomy  2008    right  . Liver biopsy  09/18/11  . Liver lobectomy  10/26/11  . Cholecystectomy  10/26/11  . Lipoma excision Left 11/18/2013    Procedure: EXCISION POSTERIOR NECK LIPOMA ;  Surgeon: Stark Klein, MD;  Location: [place];  Service: General;  Laterality: Left;    Current Outpatient Prescriptions  Medication Sig Dispense Refill  . acetaminophen (TYLENOL) 500 MG tablet Take 500 mg by mouth every 6 (six) hours as needed.      . ALPRAZolam (XANAX) 0.5 MG tablet Take 0.5 mg by mouth 2 (two) times daily as needed. For anxiety      . aspirin 81 MG tablet Take 81 mg by mouth daily.        Marland Kitchen atenolol (TENORMIN) 100 MG tablet Take 100 mg by mouth daily.       Marland Kitchen atorvastatin  (LIPITOR) 10 MG tablet Take 10 mg by mouth daily.        . clidinium-chlordiazePOXIDE (LIBRAX) 2.5-5 MG per capsule Take 1 capsule by mouth 3 (three) times daily as needed. For IBS symptoms      . diphenhydrAMINE (BENADRYL) 25 MG tablet Take 25 mg by mouth at bedtime.       . docusate sodium (COLACE) 100 MG capsule Take 100 mg by mouth 2 (two) times daily as needed.       . hydrochlorothiazide (HYDRODIURIL) 25 MG tablet Take 25 mg by mouth daily.      Marland Kitchen ibuprofen (ADVIL,MOTRIN) 200 MG tablet Take 200 mg by mouth every 8 (eight) hours as needed. For pain       . levothyroxine (SYNTHROID, LEVOTHROID) 100 MCG tablet Take 100 mcg by mouth daily before breakfast.      . Multiple Vitamins-Minerals (MULTIVITAMIN WITH MINERALS) tablet Take 1 tablet by mouth daily.      . nitroGLYCERIN (NITROSTAT) 0.4 MG SL tablet Place 0.4 mg under the tongue every 5 (five) minutes as needed. For chest pain      .  omeprazole (PRILOSEC) 20 MG capsule Take 20 mg by mouth daily.        . Polyethylene Glycol 3350 (MIRALAX PO) Take by mouth as needed.       Marland Kitchen PROTOPIC 0.1 % ointment Apply 1 application topically daily as needed. For psoriasis      . Psyllium (METAMUCIL PO) Take by mouth as needed.       . vitamin B-12 (CYANOCOBALAMIN) 500 MCG tablet Take 500 mcg by mouth as needed.      . vitamin D, CHOLECALCIFEROL, 400 UNITS tablet Take 400 Units by mouth as needed.       No current facility-administered medications for this visit.    Allergies  Allergen Reactions  . Meperidine Hcl     REACTION: severe headache  . Pseudoephedrine Other (See Comments)    Hypertension   . Demerol Other (See Comments)    Severe headache.  . Sulfa Antibiotics Other (See Comments)    Happened when he was child. Does not remember reaction.    History   Social History  . Marital Status: Married    Spouse Name: N/A    Number of Children: 0  . Years of Education: N/A   Occupational History  . Retired from state of [place]  . Smoking status: Former Smoker -- 1.00 packs/day for 15 years    Types: Cigarettes    Quit date: 10/26/1983  . Smokeless tobacco: Never Used  . Alcohol Use: No  . Drug Use: No  . Sexual Activity: Not on file   Other Topics Concern  . Not on file   Social History Narrative  . No narrative on file    Family History  Problem Relation Age of Onset  . Hypertension Mother   . Heart failure Mother   . Heart disease Mother   . Peripheral vascular disease Father   . Lung cancer Father   . Cancer Father     lung  . Hypertension Sister   . Colon cancer Neg Hx   . Heart attack Maternal Uncle     Review of Systems:  As stated in the HPI and otherwise negative.   BP 146/86  Pulse 62  Ht 5\' 6"  (1.676 m)  Wt 163 lb (73.936 kg)  BMI 26.32 kg/m2  Physical Examination: General: Well developed, well nourished, NAD HEENT: OP clear, mucus membranes moist SKIN: warm, dry. No rashes. Neuro: No focal deficits Musculoskeletal: Muscle strength 5/5 all ext Psychiatric: Mood and affect normal Neck: No JVD, no carotid bruits, no thyromegaly, no lymphadenopathy. Lungs:Clear bilaterally, no wheezes, rhonci, crackles Cardiovascular: Regular rate and rhythm. No murmurs, gallops or rubs. Abdomen:Soft. Bowel sounds present. Non-tender.  Extremities: No lower extremity edema. Pulses are 2 + in the bilateral DP/PT.  Cardiac cath 11/02/05: 1. The left main coronary is free of critical disease.  2. The left anterior descending artery demonstrates about an area of about  30-40% narrowing segmentally in the proximal LAD overlapping the origin  of the diagonal. The diagonal is moderate in distribution, but is fairly  small in caliber. It has an 80% ostial stenosis. Compared to the  previous study there is perhaps very mild progression of disease in the  proximal LAD. The mid vessel, where the stent was previously placed  remains widely patent with excellent runoff into the  distal vessel. The  distal LAD is free of critical disease.  3. The circumflex is large-caliber vessel with mild proximal irregularity  overlapping the origin of the first marginal branch. Importantly, after  the administration of intracoronary nitroglycerin this area looked more  like 30-40% due to vasodilatation of the distal vessel. However, none  of this appeared to be flow-limiting and was fairly large in caliber.  4. The right coronary artery is a large-caliber vessel. There is perhaps  mild irregularity in the mid vessel but no areas of high-grade disease.  5. Ventriculography in the RAO projection reveals vigorous global systolic  function without segmental wall motion abnormality.  Echo 03/18/13: Left ventricle: The cavity size was normal. Wall thickness was increased in a pattern of mild LVH. There was mild concentric hypertrophy. Systolic function was normal. The estimated ejection fraction was in the range of 60% to 65%. Wall motion was normal; there were no regional wall motion abnormalities. Left ventricular diastolic function parameters were normal. - Aortic valve: Trivial regurgitation.  EKG: NSR, rate 62 bpm. ST depression inferior and lateral leads, unchanged from 2014.   Assessment and Plan:   1. CAD: Stable. He is on a statin, beta blocker and ASA. Based on the fact that he has a first generation DES and based on the DAPT trial, will restart Plavix 75 mg po Qdaily. NTG refill.   2. HTN: BP is elevated today. He has been followed closely by Dr. Nyoka Cowden and has a f/u in am to have this checked again.   3. HLD: He is on a statin. Lipids followed in primary care  Extensive chart review including EKG, old cath and echo. Old office notice reviewed.

## 2014-04-14 ENCOUNTER — Other Ambulatory Visit: Payer: Self-pay

## 2014-04-14 MED ORDER — CLOPIDOGREL BISULFATE 75 MG PO TABS: 75.0000 mg | ORAL_TABLET | Freq: Every day | ORAL | Status: DC

## 2015-03-26 ENCOUNTER — Ambulatory Visit (INDEPENDENT_AMBULATORY_CARE_PROVIDER_SITE_OTHER): Payer: Medicare Other | Admitting: Cardiovascular Disease

## 2015-03-26 ENCOUNTER — Encounter: Payer: Self-pay | Admitting: Cardiovascular Disease

## 2015-03-26 VITALS — BP 142/90 | HR 75 | Ht 66.0 in | Wt 174.0 lb

## 2015-03-26 DIAGNOSIS — E785 Hyperlipidemia, unspecified: Secondary | ICD-10-CM | POA: Diagnosis not present

## 2015-03-26 DIAGNOSIS — I2510 Atherosclerotic heart disease of native coronary artery without angina pectoris: Secondary | ICD-10-CM

## 2015-03-26 DIAGNOSIS — I10 Essential (primary) hypertension: Secondary | ICD-10-CM

## 2015-03-26 MED ORDER — CLOPIDOGREL BISULFATE 75 MG PO TABS: 75.0000 mg | ORAL_TABLET | Freq: Every day | ORAL | Status: DC

## 2015-03-26 NOTE — Patient Instructions (Signed)
Your physician wants you to follow-up in:  12 months. You will receive a reminder letter in the mail two months in advance. If you don't receive a letter, please call our office to schedule the follow-up appointment.  Your physician has requested that you have an exercise tolerance test. For further information please visit [email or web address]. Please also follow instruction sheet, as given.

## 2015-03-26 NOTE — Progress Notes (Signed)
Chief Complaint  Patient presents with  . Follow-up    ANNUAL VISIT   Sinus congestion  History of Present Illness: 69 yo male with history of CAD, HTN, HLD, renal cell carcinoma s/p right nephrectomy here today for cardiac follow up. He has been followed in the past by Dr. Lia Foyer. Cardiac cath February 2006 with a 2.75 x 28 mm Costar study stent (DES) placed in the mid LAD. Last cath November 2006 with patent mid LAD stent, 40% proximal LAD stenosis, 80% small diagonal branch stenosis, minimal disease in the Circumflex and RCA. Echo 03/18/13 with normal LV function, mild LVH.   He is doing well. No chest pain or SOB. He is not exercising every day. He has been caring for his wife.   Primary Care Physician: [name]   Last Lipid Profile: Followed in primary care.   Past Medical History  Diagnosis Date  . Coronary artery disease   . Hyperlipidemia   . PVD (peripheral vascular disease)   . Renal cell cancer   . GERD (gastroesophageal reflux disease)   . IBS (irritable bowel syndrome)   . Hypertension   . Hearing loss   . Rash   . Eczema   . Psoriasis   . Anxiety   . Wears hearing aid     both ears  . Wears glasses     Past Surgical History  Procedure Laterality Date  . Tonsillectomy    . Finger surgery  69 y/o    cut index rt  . Colonoscopy    . Coronary stent placement  2006  . Nephrectomy  2008    right  . Liver biopsy  09/18/11  . Liver lobectomy  10/26/11  . Cholecystectomy  10/26/11  . Lipoma excision Left 11/18/2013    Procedure: EXCISION POSTERIOR NECK LIPOMA ;  Surgeon: Stark Klein, MD;  Location: [facility];  Service: General;  Laterality: Left;    Current Outpatient Prescriptions  Medication Sig Dispense Refill  . acetaminophen (TYLENOL) 500 MG tablet Take 500 mg by mouth every 6 (six) hours as needed.    . ALPRAZolam (XANAX) 0.5 MG tablet Take 0.5 mg by mouth 2 (two) times daily as needed. For anxiety    . aspirin 81 MG tablet Take  81 mg by mouth daily.      Marland Kitchen atenolol (TENORMIN) 100 MG tablet Take 100 mg by mouth daily.     Marland Kitchen atorvastatin (LIPITOR) 10 MG tablet Take 10 mg by mouth daily.      . clidinium-chlordiazePOXIDE (LIBRAX) 2.5-5 MG per capsule Take 1 capsule by mouth 3 (three) times daily as needed. For IBS symptoms    . clopidogrel (PLAVIX) 75 MG tablet Take 1 tablet (75 mg total) by mouth daily. 90 tablet 4  . diphenhydrAMINE (BENADRYL) 25 MG tablet Take 25 mg by mouth at bedtime.     . docusate sodium (COLACE) 100 MG capsule Take 100 mg by mouth 2 (two) times daily as needed.     . hydrochlorothiazide (HYDRODIURIL) 25 MG tablet Take 25 mg by mouth daily.    Marland Kitchen ibuprofen (ADVIL,MOTRIN) 200 MG tablet Take 200 mg by mouth every 8 (eight) hours as needed. For pain     . levothyroxine (SYNTHROID, LEVOTHROID) 125 MCG tablet Take 125 mcg by mouth daily.  0  . nitroGLYCERIN (NITROSTAT) 0.4 MG SL tablet Place 1 tablet (0.4 mg total) under the tongue every 5 (five) minutes as needed. For chest pain 25 tablet  6  . omeprazole (PRILOSEC) 20 MG capsule Take 20 mg by mouth daily.      Marland Kitchen OVER THE COUNTER MEDICATION Take 1 tablet by mouth every 6 (six) hours as needed. Cold Symptom Relief    . OVER THE COUNTER MEDICATION Take 1 tablet by mouth at bedtime as needed. Coriciden HBP cough expectorant suppressant    . Polyethylene Glycol 3350 (MIRALAX PO) Take by mouth as needed.     Marland Kitchen PROTOPIC 0.1 % ointment Apply 1 application topically daily as needed. For psoriasis    . Psyllium (METAMUCIL PO) Take by mouth as needed.     . simethicone (MYLICON) 628 MG chewable tablet Chew 125 mg by mouth every 6 (six) hours as needed for flatulence.    . valACYclovir (VALTREX) 500 MG tablet Take 500 mg by mouth 2 (two) times daily as needed.  0   No current facility-administered medications for this visit.    Allergies  Allergen Reactions  . Meperidine Hcl Other (See Comments)    REACTION: severe headache  . Demerol Other (See Comments)     Severe headache.  . Pseudoephedrine Other (See Comments)    Hypertension   . Sulfa Antibiotics Other (See Comments)    Happened when he was child. Does not remember reaction.    History   Social History  . Marital Status: Married    Spouse Name: N/A  . Number of Children: 0  . Years of Education: N/A   Occupational History  . Retired from state of [employer]  . Smoking status: Former Smoker -- 1.00 packs/day for 15 years    Types: Cigarettes    Quit date: 10/26/1983  . Smokeless tobacco: Never Used  . Alcohol Use: No  . Drug Use: No  . Sexual Activity: Not on file   Other Topics Concern  . Not on file   Social History Narrative    Family History  Problem Relation Age of Onset  . Hypertension Mother   . Heart failure Mother   . Heart disease Mother   . Peripheral vascular disease Father   . Lung cancer Father   . Cancer Father     lung  . Hypertension Sister   . Colon cancer Neg Hx   . Heart attack Maternal Uncle     Review of Systems:  As stated in the HPI and otherwise negative.   BP 142/90 mmHg  Pulse 75  Ht 5\' 6"  (1.676 m)  Wt 174 lb (78.926 kg)  BMI 28.10 kg/m2  Physical Examination: General: Well developed, well nourished, NAD HEENT: OP clear, mucus membranes moist SKIN: warm, dry. No rashes. Neuro: No focal deficits Musculoskeletal: Muscle strength 5/5 all ext Psychiatric: Mood and affect normal Neck: No JVD, no carotid bruits, no thyromegaly, no lymphadenopathy. Lungs:Clear bilaterally, no wheezes, rhonci, crackles Cardiovascular: Regular rate and rhythm. No murmurs, gallops or rubs. Abdomen:Soft. Bowel sounds present. Non-tender.  Extremities: No lower extremity edema. Pulses are 2 + in the bilateral DP/PT.  Cardiac cath 11/02/05: 1. The left main coronary is free of critical disease.  2. The left anterior descending artery demonstrates about an area of about  30-40% narrowing segmentally in the proximal LAD  overlapping the origin  of the diagonal. The diagonal is moderate in distribution, but is fairly  small in caliber. It has an 80% ostial stenosis. Compared to the  previous study there is perhaps very mild progression of disease in the  proximal LAD.  The mid vessel, where the stent was previously placed  remains widely patent with excellent runoff into the distal vessel. The  distal LAD is free of critical disease.  3. The circumflex is large-caliber vessel with mild proximal irregularity  overlapping the origin of the first marginal branch. Importantly, after  the administration of intracoronary nitroglycerin this area looked more  like 30-40% due to vasodilatation of the distal vessel. However, none  of this appeared to be flow-limiting and was fairly large in caliber.  4. The right coronary artery is a large-caliber vessel. There is perhaps  mild irregularity in the mid vessel but no areas of high-grade disease.  5. Ventriculography in the RAO projection reveals vigorous global systolic  function without segmental wall motion abnormality.  Echo 03/18/13: Left ventricle: The cavity size was normal. Wall thickness was increased in a pattern of mild LVH. There was mild concentric hypertrophy. Systolic function was normal. The estimated ejection fraction was in the range of 60% to 65%. Wall motion was normal; there were no regional wall motion abnormalities. Left ventricular diastolic function parameters were normal. - Aortic valve: Trivial regurgitation.  EKG:  EKG is ordered today. It shows sinus, non-specific ST changes.   Recent Labs: No results found for requested labs within last 365 days.   Lipid Panel Followed in primary care   Wt Readings from Last 3 Encounters:  03/26/15 174 lb (78.926 kg)  03/16/14 163 lb (73.936 kg)  11/28/13 163 lb (73.936 kg)     Other studies Reviewed: Additional studies/ records that were reviewed today include. Review of the above records  demonstrates:  Assessment and Plan:   1. CAD: Stable. He is on a statin, beta blocker and ASA/Plavix (First generation DES). Will arrange exercise stress test this spring to exclude ischemia.     2. HTN: BP slightly elevated today. He has been followed closely by Dr. Nyoka Cowden for this. .   3. HLD: He is on a statin. Lipids followed in primary care  Current medicines are reviewed at length with the patient today.  The patient does not have concerns regarding medicines.  The following changes have been made:  no change  Labs/ tests ordered today include: No orders of the defined types were placed in this encounter.    Disposition:   FU with me in 12  months  Signed, [name], MD 03/26/2015 2:26 PM    [address] [place], [place], [place]  [address] Phone: [phone]; Fax: [phone]

## 2015-05-05 ENCOUNTER — Encounter: Payer: Self-pay | Admitting: Physician Assistant

## 2015-05-05 ENCOUNTER — Ambulatory Visit (INDEPENDENT_AMBULATORY_CARE_PROVIDER_SITE_OTHER): Payer: Medicare Other | Admitting: Physician Assistant

## 2015-05-05 ENCOUNTER — Encounter: Payer: Medicare Other | Admitting: Physician Assistant

## 2015-05-05 ENCOUNTER — Ambulatory Visit: Payer: Medicare Other

## 2015-05-05 VITALS — BP 194/85 | HR 66 | Ht 66.0 in | Wt 174.0 lb

## 2015-05-05 DIAGNOSIS — I10 Essential (primary) hypertension: Secondary | ICD-10-CM | POA: Diagnosis not present

## 2015-05-05 DIAGNOSIS — E785 Hyperlipidemia, unspecified: Secondary | ICD-10-CM

## 2015-05-05 DIAGNOSIS — I2510 Atherosclerotic heart disease of native coronary artery without angina pectoris: Secondary | ICD-10-CM | POA: Diagnosis not present

## 2015-05-05 DIAGNOSIS — R0789 Other chest pain: Secondary | ICD-10-CM

## 2015-05-05 DIAGNOSIS — R9431 Abnormal electrocardiogram [ECG] [EKG]: Secondary | ICD-10-CM

## 2015-05-05 MED ORDER — AMLODIPINE BESYLATE 5 MG PO TABS: 5.0000 mg | ORAL_TABLET | Freq: Every day | ORAL | Status: DC

## 2015-05-05 NOTE — Progress Notes (Signed)
Cardiology Office Note   Date:  05/05/2015   ID:  Bradley Irwin, DOB [birth year], MRN [id number]  PCP:  Criselda Peaches, MD  Cardiologist:  Dr. [name]     Chief complaint: 1.  Here for ETT 2.  Hypertension   History of Present Illness: Bradley Irwin is a 69 y.o. male with a hx of CAD, HTN, HLD, renal cell carcinoma s/p right nephrectomy here today for cardiac follow up. He has been followed in the past by Dr. Lia Foyer. Cardiac cath February 2006 with a 2.75 x 28 mm Costar study stent (DES) placed in the mid LAD. Last cath November 2006 with patent mid LAD stent, 40% proximal LAD stenosis, 80% small diagonal branch stenosis, minimal disease in the Circumflex and RCA. Echo 03/18/13 with normal LV function, mild LVH.  He recently saw Dr. Angelena Form for routine follow-up. Routine exercise stress test was ranged. He presented to the office today for his exercise treadmill test. Today, the patient's blood pressure is markedly elevated. He did hold his beta blocker this AM. ECG is also abnormal. When compared to prior EKGs, there has been no significant change. The patient tells [place] that he has chest pain. He's been having chest pain since he was 16. He gets it occasionally when he exerts himself. Symptoms have been stable for many years without significant change. He denies shortness of breath. He denies syncope. He denies edema.   Studies/Reports Reviewed Today:  Cardiac cath 11/02/05: 1. The left main coronary is free of critical disease.  2. The left anterior descending artery demonstrates about an area of about  30-40% narrowing segmentally in the proximal LAD overlapping the origin  of the diagonal. The diagonal is moderate in distribution, but is fairly  small in caliber. It has an 80% ostial stenosis. Compared to the  previous study there is perhaps very mild progression of disease in the  proximal LAD. The mid vessel, where the stent was previously placed   remains widely patent with excellent runoff into the distal vessel. The  distal LAD is free of critical disease.  3. The circumflex is large-caliber vessel with mild proximal irregularity  overlapping the origin of the first marginal branch. Importantly, after  the administration of intracoronary nitroglycerin this area looked more  like 30-40% due to vasodilatation of the distal vessel. However, none  of this appeared to be flow-limiting and was fairly large in caliber.  4. The right coronary artery is a large-caliber vessel. There is perhaps  mild irregularity in the mid vessel but no areas of high-grade disease.  5. Ventriculography in the RAO projection reveals vigorous global systolic  function without segmental wall motion abnormality.  Echo 03/18/13: Left ventricle: The cavity size was normal. Wall thickness was increased in a pattern of mild LVH. There was mild concentric hypertrophy. Systolic function was normal. The estimated ejection fraction was in the range of 60% to 65%. Wall motion was normal; there were no regional wall motion abnormalities. Left ventricular diastolic function parameters were normal. - Aortic valve: Trivial regurgitation.   Past Medical History  Diagnosis Date  . Coronary artery disease   . Hyperlipidemia   . PVD (peripheral vascular disease)   . Renal cell cancer   . GERD (gastroesophageal reflux disease)   . IBS (irritable bowel syndrome)   . Hypertension   . Hearing loss   . Rash   . Eczema   . Psoriasis   . Anxiety   . Wears hearing  aid     both ears  . Wears glasses     Past Surgical History  Procedure Laterality Date  . Tonsillectomy    . Finger surgery  69 y/o    cut index rt  . Colonoscopy    . Coronary stent placement  2006  . Nephrectomy  2008    right  . Liver biopsy  09/18/11  . Liver lobectomy  10/26/11  . Cholecystectomy  10/26/11  . Lipoma excision Left 11/18/2013    Procedure: EXCISION POSTERIOR NECK  LIPOMA ;  Surgeon: Stark Klein, MD;  Location: [facility];  Service: General;  Laterality: Left;     Current Outpatient Prescriptions  Medication Sig Dispense Refill  . acetaminophen (TYLENOL) 500 MG tablet Take 500 mg by mouth every 6 (six) hours as needed.    . ALPRAZolam (XANAX) 0.5 MG tablet Take 0.5 mg by mouth 2 (two) times daily as needed. For anxiety    . aspirin 81 MG tablet Take 81 mg by mouth daily.      Marland Kitchen atenolol (TENORMIN) 100 MG tablet Take 100 mg by mouth daily.     Marland Kitchen atorvastatin (LIPITOR) 10 MG tablet Take 10 mg by mouth daily.      . clidinium-chlordiazePOXIDE (LIBRAX) 2.5-5 MG per capsule Take 1 capsule by mouth 3 (three) times daily as needed. For IBS symptoms    . clopidogrel (PLAVIX) 75 MG tablet Take 1 tablet (75 mg total) by mouth daily. 90 tablet 3  . diphenhydrAMINE (BENADRYL) 25 MG tablet Take 25 mg by mouth at bedtime.     . docusate sodium (COLACE) 100 MG capsule Take 100 mg by mouth 2 (two) times daily as needed.     . hydrochlorothiazide (HYDRODIURIL) 25 MG tablet Take 25 mg by mouth daily.    Marland Kitchen ibuprofen (ADVIL,MOTRIN) 200 MG tablet Take 200 mg by mouth every 8 (eight) hours as needed. For pain     . levothyroxine (SYNTHROID, LEVOTHROID) 125 MCG tablet Take 125 mcg by mouth daily.  0  . nitroGLYCERIN (NITROSTAT) 0.4 MG SL tablet Place 1 tablet (0.4 mg total) under the tongue every 5 (five) minutes as needed. For chest pain 25 tablet 6  . omeprazole (PRILOSEC) 20 MG capsule Take 20 mg by mouth daily.      Marland Kitchen OVER THE COUNTER MEDICATION Take 1 tablet by mouth every 6 (six) hours as needed. Cold Symptom Relief    . OVER THE COUNTER MEDICATION Take 1 tablet by mouth at bedtime as needed. Coriciden HBP cough expectorant suppressant    . Polyethylene Glycol 3350 (MIRALAX PO) Take by mouth as needed.     Marland Kitchen PROTOPIC 0.1 % ointment Apply 1 application topically daily as needed. For psoriasis    . Psyllium (METAMUCIL PO) Take by mouth as needed.     .  simethicone (MYLICON) 161 MG chewable tablet Chew 125 mg by mouth every 6 (six) hours as needed for flatulence.    . valACYclovir (VALTREX) 500 MG tablet Take 500 mg by mouth 2 (two) times daily as needed.  0   No current facility-administered medications for this visit.    Allergies:   Meperidine hcl; Demerol; Pseudoephedrine; and Sulfa antibiotics    Social History:  The patient  reports that he quit smoking about 31 years ago. His smoking use included Cigarettes. He has a 15 pack-year smoking history. He has never used smokeless tobacco. He reports that he does not drink alcohol or use illicit drugs.  Family History:  The patient's family history includes Cancer in his father; Heart attack in his maternal uncle; Heart disease in his mother; Heart failure in his mother; Hypertension in his mother and sister; Lung cancer in his father; Peripheral vascular disease in his father. There is no history of Colon cancer.    ROS:   Please see the history of present illness.   Review of Systems  Constitution: Negative for fever.  Respiratory: Negative for cough.   Hematologic/Lymphatic: Negative for bleeding problem.  Neurological: Negative for dizziness.  All other systems reviewed and are negative.     PHYSICAL EXAM: VS:  BP 194/85 mmHg  Pulse 66  Ht '5\' 6"'$  (1.676 m)  Wt 174 lb (78.926 kg)  BMI 28.10 kg/m2    Wt Readings from Last 3 Encounters:  03/26/15 174 lb (78.926 kg)  03/16/14 163 lb (73.936 kg)  11/28/13 163 lb (73.936 kg)     GEN: Well nourished, well developed, in no acute distress HEENT: normal Neck: no JVD,   no masses Cardiac:  Normal S1/S2, RRR; no murmur ,  no rubs or gallops, no edema   Respiratory:  clear to auscultation bilaterally, no wheezing, rhonchi or rales. GI: soft, nontender, nondistended, + BS MS: no deformity or atrophy Skin: warm and dry  Neuro:  CNs II-XII intact, Strength and sensation are intact Psych: Normal affect   EKG:  EKG is ordered  today.  It demonstrates:   NSR, HR 66, diffuse inf-lat ST depression, LVH   Recent Labs: No results found for requested labs within last 365 days.    Lipid Panel No results found for: CHOL, TRIG, HDL, CHOLHDL, VLDL, LDLCALC, LDLDIRECT    ASSESSMENT AND PLAN:  Essential hypertension BP out of control.  He notes a hx of "white coat HTN."  However, BP readings at home are consistently in the 140s.  I will start Amlodipine 5 mg QD. Return for RN visit in 1 week. He will keep an eye on his BP at home as well.  I have asked him to take his Atenolol when he gets home.  Coronary artery disease Routine ETT for today.  He does note a hx of chest pain for years with exertion.  There has been no change.  He has an abnormal ECG at baseline (? LVH with repol).  His ECG is not interpretable for his ETT.  Given his hx of CP, uncontrolled BP and abnormal ECG, I have DC'd his ETT.    -  Arrange Lexiscan Myoview.  -  Continue ASA, Plavix, beta-blocker, statin.  Hyperlipidemia Continue statin.   Current medicines are reviewed at length with the patient today.  Concerns regarding medicines are as outlined above.  The following changes have been made:    Start Amlodipine 5 mg QD   Labs/ tests ordered today include:  No orders of the defined types were placed in this encounter.    Disposition:   FU with Dr. [name] as planned.    Signed, Versie Starks, MHS 05/05/2015 10:56 AM    [facility] [address], [place], [place]  [address] Phone: [phone]; Fax: [phone]

## 2015-05-05 NOTE — Progress Notes (Deleted)
Cardiology Office Note   Date:  05/05/2015   ID:  Bradley Irwin, DOB 10-30-1946, MRN [id number]  PCP:  Criselda Peaches, MD  Cardiologist:  Dr. [name]     No chief complaint on file.    History of Present Illness: Bradley Irwin is a 69 y.o. male with a hx of CAD, HTN, HLD, renal cell carcinoma s/p right nephrectomy here today for cardiac follow up. He has been followed in the past by Dr. Lia Foyer. Cardiac cath February 2006 with a 2.75 x 28 mm Costar study stent (DES) placed in the mid LAD. Last cath November 2006 with patent mid LAD stent, 40% proximal LAD stenosis, 80% small diagonal branch stenosis, minimal disease in the Circumflex and RCA. Echo 03/18/13 with normal LV function, mild LVH.  He recently saw Dr. Angelena Form for routine follow-up. Routine exercise stress test was ranged. He presented to the office today for his exercise treadmill test. Today, the patient's blood pressure is markedly elevated. He did hold his beta blocker this AM. ECG is also abnormal. When compared to prior EKGs, there has been no significant change. The patient tells [place] that he has chest pain. He's been having chest pain since he was 16. He gets it occasionally when he exerts himself. Symptoms have been stable for many years without significant change. He denies shortness of breath. He denies syncope. He denies edema.   Studies/Reports Reviewed Today:  Cardiac cath 11/02/05: 1. The left main coronary is free of critical disease.  2. The left anterior descending artery demonstrates about an area of about  30-40% narrowing segmentally in the proximal LAD overlapping the origin  of the diagonal. The diagonal is moderate in distribution, but is fairly  small in caliber. It has an 80% ostial stenosis. Compared to the  previous study there is perhaps very mild progression of disease in the  proximal LAD. The mid vessel, where the stent was previously placed  remains widely patent  with excellent runoff into the distal vessel. The  distal LAD is free of critical disease.  3. The circumflex is large-caliber vessel with mild proximal irregularity  overlapping the origin of the first marginal branch. Importantly, after  the administration of intracoronary nitroglycerin this area looked more  like 30-40% due to vasodilatation of the distal vessel. However, none  of this appeared to be flow-limiting and was fairly large in caliber.  4. The right coronary artery is a large-caliber vessel. There is perhaps  mild irregularity in the mid vessel but no areas of high-grade disease.  5. Ventriculography in the RAO projection reveals vigorous global systolic  function without segmental wall motion abnormality.  Echo 03/18/13: Left ventricle: The cavity size was normal. Wall thickness was increased in a pattern of mild LVH. There was mild concentric hypertrophy. Systolic function was normal. The estimated ejection fraction was in the range of 60% to 65%. Wall motion was normal; there were no regional wall motion abnormalities. Left ventricular diastolic function parameters were normal. - Aortic valve: Trivial regurgitation.   Past Medical History  Diagnosis Date  . Coronary artery disease   . Hyperlipidemia   . PVD (peripheral vascular disease)   . Renal cell cancer   . GERD (gastroesophageal reflux disease)   . IBS (irritable bowel syndrome)   . Hypertension   . Hearing loss   . Rash   . Eczema   . Psoriasis   . Anxiety   . Wears hearing aid  both ears  . Wears glasses     Past Surgical History  Procedure Laterality Date  . Tonsillectomy    . Finger surgery  69 y/o    cut index rt  . Colonoscopy    . Coronary stent placement  2006  . Nephrectomy  2008    right  . Liver biopsy  09/18/11  . Liver lobectomy  10/26/11  . Cholecystectomy  10/26/11  . Lipoma excision Left 11/18/2013    Procedure: EXCISION POSTERIOR NECK LIPOMA ;  Surgeon: Stark Klein, MD;  Location: [facility];  Service: General;  Laterality: Left;     Current Outpatient Prescriptions  Medication Sig Dispense Refill  . acetaminophen (TYLENOL) 500 MG tablet Take 500 mg by mouth every 6 (six) hours as needed.    . ALPRAZolam (XANAX) 0.5 MG tablet Take 0.5 mg by mouth 2 (two) times daily as needed. For anxiety    . aspirin 81 MG tablet Take 81 mg by mouth daily.      Marland Kitchen atenolol (TENORMIN) 100 MG tablet Take 100 mg by mouth daily.     Marland Kitchen atorvastatin (LIPITOR) 10 MG tablet Take 10 mg by mouth daily.      . clidinium-chlordiazePOXIDE (LIBRAX) 2.5-5 MG per capsule Take 1 capsule by mouth 3 (three) times daily as needed. For IBS symptoms    . clopidogrel (PLAVIX) 75 MG tablet Take 1 tablet (75 mg total) by mouth daily. 90 tablet 3  . diphenhydrAMINE (BENADRYL) 25 MG tablet Take 25 mg by mouth at bedtime.     . docusate sodium (COLACE) 100 MG capsule Take 100 mg by mouth 2 (two) times daily as needed.     . hydrochlorothiazide (HYDRODIURIL) 25 MG tablet Take 25 mg by mouth daily.    Marland Kitchen ibuprofen (ADVIL,MOTRIN) 200 MG tablet Take 200 mg by mouth every 8 (eight) hours as needed. For pain     . levothyroxine (SYNTHROID, LEVOTHROID) 125 MCG tablet Take 125 mcg by mouth daily.  0  . nitroGLYCERIN (NITROSTAT) 0.4 MG SL tablet Place 1 tablet (0.4 mg total) under the tongue every 5 (five) minutes as needed. For chest pain 25 tablet 6  . omeprazole (PRILOSEC) 20 MG capsule Take 20 mg by mouth daily.      Marland Kitchen OVER THE COUNTER MEDICATION Take 1 tablet by mouth every 6 (six) hours as needed. Cold Symptom Relief    . OVER THE COUNTER MEDICATION Take 1 tablet by mouth at bedtime as needed. Coriciden HBP cough expectorant suppressant    . Polyethylene Glycol 3350 (MIRALAX PO) Take by mouth as needed.     Marland Kitchen PROTOPIC 0.1 % ointment Apply 1 application topically daily as needed. For psoriasis    . Psyllium (METAMUCIL PO) Take by mouth as needed.     . simethicone (MYLICON) 242  MG chewable tablet Chew 125 mg by mouth every 6 (six) hours as needed for flatulence.    . valACYclovir (VALTREX) 500 MG tablet Take 500 mg by mouth 2 (two) times daily as needed.  0   No current facility-administered medications for this visit.    Allergies:   Meperidine hcl; Demerol; Pseudoephedrine; and Sulfa antibiotics    Social History:  The patient  reports that he quit smoking about 31 years ago. His smoking use included Cigarettes. He has a 15 pack-year smoking history. He has never used smokeless tobacco. He reports that he does not drink alcohol or use illicit drugs.   Family History:  The  patient's family history includes Cancer in his father; Heart attack in his maternal uncle; Heart disease in his mother; Heart failure in his mother; Hypertension in his mother and sister; Lung cancer in his father; Peripheral vascular disease in his father. There is no history of Colon cancer.    ROS:   Please see the history of present illness.   Review of Systems  Constitution: Negative for fever.  Respiratory: Negative for cough.   Hematologic/Lymphatic: Negative for bleeding problem.  Neurological: Negative for dizziness.  All other systems reviewed and are negative.     PHYSICAL EXAM: VS:  BP 194/85 mmHg  Pulse 66  Ht '5\' 6"'$  (1.676 m)  Wt 174 lb (78.926 kg)  BMI 28.10 kg/m2    Wt Readings from Last 3 Encounters:  03/26/15 174 lb (78.926 kg)  03/16/14 163 lb (73.936 kg)  11/28/13 163 lb (73.936 kg)     GEN: Well nourished, well developed, in no acute distress HEENT: normal Neck: no JVD,   no masses Cardiac:  Normal S1/S2, RRR; no murmur ,  no rubs or gallops, no edema   Respiratory:  clear to auscultation bilaterally, no wheezing, rhonchi or rales. GI: soft, nontender, nondistended, + BS MS: no deformity or atrophy Skin: warm and dry  Neuro:  CNs II-XII intact, Strength and sensation are intact Psych: Normal affect   EKG:  EKG is ordered today.  It demonstrates:    NSR, HR 66, diffuse inf-lat ST depression, LVH   Recent Labs: No results found for requested labs within last 365 days.    Lipid Panel No results found for: CHOL, TRIG, HDL, CHOLHDL, VLDL, LDLCALC, LDLDIRECT    ASSESSMENT AND PLAN:  Essential hypertension BP out of control.  He notes a hx of "white coat HTN."  However, BP readings at home are consistently in the 140s.  I will start Amlodipine 5 mg QD. Return for RN visit in 1 week. He will keep an eye on his BP at home as well.  I have asked him to take his Atenolol when he gets home.  Coronary artery disease Routine ETT for today.  He does note a hx of chest pain for years with exertion.  There has been no change.  He has an abnormal ECG at baseline (? LVH with repol).  His ECG is not interpretable for his ETT.  Given his hx of CP, uncontrolled BP and abnormal ECG, I have DC'd his ETT.    -  Arrange Lexiscan Myoview.  -  Continue ASA, Plavix, beta-blocker, statin.  Hyperlipidemia Continue statin.   Current medicines are reviewed at length with the patient today.  Concerns regarding medicines are as outlined above.  The following changes have been made:    Start Amlodipine 5 mg QD   Labs/ tests ordered today include:  No orders of the defined types were placed in this encounter.    Disposition:   FU with Dr. [name] as planned.    Signed, Versie Starks, MHS 05/05/2015 10:56 AM    [facility] [place], [place], [place]  [address] Phone: [phone]; Fax: [phone]

## 2015-05-05 NOTE — Patient Instructions (Addendum)
Medication Instructions:  Start taking Amlodipine 5 mg Once daily.  A prescription has been sent to your pharmacy.  Labwork: None   Testing/Procedures: Cancel Exercise Stress Test. Schedule Lexiscan Myoview. Your physician has requested that you have a lexiscan myoview. For further information please visit [email or web address]. Please follow instruction sheet, as given.    Follow-Up: BP check with the nurse in 1 week.; BP CHECK TO BE DONE IN 1 WEEK; NEW START AMLODIPINE 5 MG DAILY STARTED AS OF 05/05/15 Check your BP at home once daily. Bring in your list and your home BP machine to your visit with the nurse.  Any Other Special Instructions Will Be Listed Below (If Applicable).

## 2015-05-12 ENCOUNTER — Ambulatory Visit (INDEPENDENT_AMBULATORY_CARE_PROVIDER_SITE_OTHER): Payer: Medicare Other | Admitting: *Deleted

## 2015-05-12 ENCOUNTER — Encounter: Payer: Self-pay | Admitting: *Deleted

## 2015-05-12 VITALS — BP 150/78 | HR 72 | Wt 174.8 lb

## 2015-05-12 DIAGNOSIS — I10 Essential (primary) hypertension: Secondary | ICD-10-CM

## 2015-05-12 NOTE — Progress Notes (Signed)
1.) Reason for visit: BP check since starting Amlodipine '5mg'$  QD  2.) Name of MD requesting visit: Scott Weaver,PA  3.) H&P: Hx of hypertension  4.) ROS related to problem: States he doesn't feel any different since starting Amlodipine.  States the only time he feels BP being elevated is when he becomes anxious. BP 150/78 HR 72.  Brought his BP cuff with him today;  BP with his cuff was 161/93 HR 78             BP readings at home 5/12 145/85                                                 5/13 143/86                                                 5/14 142/83                                                 5/15 145/78                                                 5/16 125/80                                                 5/17 141/86                                                 5/18 135/74                States he saw Dr. Nyoka Cowden yesterday for his regular OV and BP was 150/?.  States he just gets "white coat syndrome" when he comes into office.  5.) Assessment and plan per MD: Reviewed with Margaret Pyle who advises for him to continue with his same dose of Amlodipine

## 2015-05-12 NOTE — Patient Instructions (Signed)
Continue same dose of Amlodipine 5 mg daily.  Continue to monitor BP daily at home and record.  Notify office if BP is over 170-180/90-100.

## 2015-05-17 ENCOUNTER — Telehealth (HOSPITAL_COMMUNITY): Payer: Self-pay

## 2015-05-17 NOTE — Telephone Encounter (Signed)
Patient given detailed instructions per Myocardial Perfusion Study Information Sheet for test on 05-18-2015 at 7:45am. Patient verbalized understanding. Oletta Lamas, Dashauna Heymann A

## 2015-05-18 ENCOUNTER — Ambulatory Visit (HOSPITAL_COMMUNITY): Payer: Medicare Other | Attending: Cardiovascular Disease

## 2015-05-18 DIAGNOSIS — R9431 Abnormal electrocardiogram [ECG] [EKG]: Secondary | ICD-10-CM | POA: Insufficient documentation

## 2015-05-18 DIAGNOSIS — I10 Essential (primary) hypertension: Secondary | ICD-10-CM | POA: Diagnosis not present

## 2015-05-18 DIAGNOSIS — I739 Peripheral vascular disease, unspecified: Secondary | ICD-10-CM | POA: Insufficient documentation

## 2015-05-18 DIAGNOSIS — R0789 Other chest pain: Secondary | ICD-10-CM | POA: Insufficient documentation

## 2015-05-18 DIAGNOSIS — I2510 Atherosclerotic heart disease of native coronary artery without angina pectoris: Secondary | ICD-10-CM | POA: Diagnosis not present

## 2015-05-18 DIAGNOSIS — R0609 Other forms of dyspnea: Secondary | ICD-10-CM | POA: Insufficient documentation

## 2015-05-18 LAB — MYOCARDIAL PERFUSION IMAGING
CHL CUP STRESS STAGE 2 GRADE: 0 %
CHL CUP STRESS STAGE 2 HR: 60 {beats}/min
CHL CUP STRESS STAGE 2 SPEED: 0 mph
CHL CUP STRESS STAGE 3 SPEED: 0 mph
CHL CUP STRESS STAGE 4 DBP: 89 mmHg
CHL CUP STRESS STAGE 4 GRADE: 0 %
CHL CUP STRESS STAGE 6 SPEED: 0 mph
CHL CUP STRESS STAGE 7 HR: 77 {beats}/min
CSEPPHR: 84 {beats}/min
LV dias vol: 72 mL
LV sys vol: 26 mL
NUC STRESS EF: 64 %
Percent of predicted max HR: 54 %
RATE: 0.3
Rest HR: 62 {beats}/min
SDS: 6
SRS: 4
SSS: 10
Stage 1 Grade: 0 %
Stage 1 HR: 62 {beats}/min
Stage 1 Speed: 0 mph
Stage 2 DBP: 89 mmHg
Stage 2 SBP: 137 mmHg
Stage 3 Grade: 0 %
Stage 3 HR: 60 {beats}/min
Stage 4 HR: 77 {beats}/min
Stage 4 SBP: 137 mmHg
Stage 4 Speed: 0 mph
Stage 5 Grade: 0 %
Stage 5 HR: 84 {beats}/min
Stage 5 Speed: 0 mph
Stage 6 DBP: 85 mmHg
Stage 6 Grade: 0 %
Stage 6 HR: 81 {beats}/min
Stage 6 SBP: 140 mmHg
Stage 7 DBP: 83 mmHg
Stage 7 Grade: 0 %
Stage 7 SBP: 144 mmHg
Stage 7 Speed: 0 mph
TID: 0.99

## 2015-05-18 MED ORDER — REGADENOSON 0.4 MG/5ML IV SOLN
0.4000 mg | Freq: Once | INTRAVENOUS | Status: AC
  Administered 2015-05-18: 0.4 mg via INTRAVENOUS

## 2015-05-18 MED ORDER — TECHNETIUM TC 99M SESTAMIBI GENERIC - CARDIOLITE
10.0000 | Freq: Once | INTRAVENOUS | Status: AC | PRN
  Administered 2015-05-18: 10 via INTRAVENOUS

## 2015-05-18 MED ORDER — TECHNETIUM TC 99M SESTAMIBI GENERIC - CARDIOLITE
30.0000 | Freq: Once | INTRAVENOUS | Status: AC | PRN
  Administered 2015-05-18: 30 via INTRAVENOUS

## 2015-05-19 ENCOUNTER — Encounter: Payer: Self-pay | Admitting: Physician Assistant

## 2015-05-19 ENCOUNTER — Encounter: Payer: Self-pay | Admitting: Cardiology

## 2016-02-06 ENCOUNTER — Ambulatory Visit (INDEPENDENT_AMBULATORY_CARE_PROVIDER_SITE_OTHER): Payer: Medicare Other

## 2016-02-06 ENCOUNTER — Ambulatory Visit (INDEPENDENT_AMBULATORY_CARE_PROVIDER_SITE_OTHER): Payer: Medicare Other | Admitting: Family Medicine

## 2016-02-06 VITALS — BP 118/72 | HR 95 | Temp 99.4°F | Resp 18 | Ht 67.0 in | Wt 192.2 lb

## 2016-02-06 DIAGNOSIS — R05 Cough: Secondary | ICD-10-CM

## 2016-02-06 DIAGNOSIS — I10 Essential (primary) hypertension: Secondary | ICD-10-CM | POA: Diagnosis not present

## 2016-02-06 DIAGNOSIS — R9389 Abnormal findings on diagnostic imaging of other specified body structures: Secondary | ICD-10-CM

## 2016-02-06 DIAGNOSIS — R042 Hemoptysis: Secondary | ICD-10-CM | POA: Diagnosis not present

## 2016-02-06 DIAGNOSIS — R938 Abnormal findings on diagnostic imaging of other specified body structures: Secondary | ICD-10-CM | POA: Diagnosis not present

## 2016-02-06 DIAGNOSIS — R059 Cough, unspecified: Secondary | ICD-10-CM

## 2016-02-06 LAB — POCT CBC
Granulocyte percent: 64.7 % (ref 37–80)
HCT, POC: 46.2 % (ref 43.5–53.7)
Hemoglobin: 15.4 g/dL (ref 14.1–18.1)
Lymph, poc: 1.7 (ref 0.6–3.4)
MCH, POC: 28.6 pg (ref 27–31.2)
MCHC: 33.4 g/dL (ref 31.8–35.4)
MCV: 85.5 fL (ref 80–97)
MID (cbc): 0.6 (ref 0–0.9)
MPV: 7.5 fL (ref 0–99.8)
POC Granulocyte: 4.1 (ref 2–6.9)
POC LYMPH PERCENT: 26.7 % (ref 10–50)
POC MID %: 8.6 % (ref 0–12)
Platelet Count, POC: 190 10*3/uL (ref 142–424)
RBC: 5.4 M/uL (ref 4.69–6.13)
RDW, POC: 15.7 %
WBC: 6.4 10*3/uL (ref 4.6–10.2)

## 2016-02-06 NOTE — Progress Notes (Signed)
 Chief Complaint:  Chief Complaint  Patient presents with  . Cough    Had a coughing spell and started to cough up blood this morning.     HPI: Bradley Irwin is a 70 y.o. male who reports to [facility] today complaining of coughing this AM and cough this AM and could not stop and laid on his side he felt a gurgling sound , main concern was coughing up blood. He was [place] when this happened and was not coughing since they left . He doe snot know if he has coughing so hard and he broke blood vessels, but this ghas never happened before. He ahs allergies. He was in the bed. Sputum and tinged blood x 2 . No clots. He is on Plavix for CAD s/p stent. He is a former smoker, has had renal cell ca in the past. No fevers, chill, unintentional weight loss, night sweats or swollen glands.   Past Medical History  Diagnosis Date  . Coronary artery disease   . Hyperlipidemia   . PVD (peripheral vascular disease) ([place])   . Renal cell cancer ([place])   . GERD (gastroesophageal reflux disease)   . IBS (irritable bowel syndrome)   . Hypertension   . Hearing loss   . Rash   . Eczema   . Psoriasis   . Anxiety   . Wears hearing aid     both ears  . Wears glasses   . Hx of cardiovascular stress test     Lexiscan Myoview 5/16:  The study is normal. No ischemia identified. This is a low risk study. Overall left ventricular systolic function was normal. LV cavity size is normal. The left ventricular ejection fraction is normal (55-65%). Sensitivity reduced by bowel loop attenuation artifact.   Past Surgical History  Procedure Laterality Date  . Tonsillectomy    . Finger surgery  70 y/o    cut index rt  . Colonoscopy    . Coronary stent placement  2006  . Nephrectomy  2008    right  . Liver biopsy  09/18/11  . Liver lobectomy  10/26/11  . Cholecystectomy  10/26/11  . Lipoma excision Left 11/18/2013    Procedure: EXCISION POSTERIOR NECK LIPOMA ;  Surgeon: Stark Klein, MD;  Location: [facility];  Service: General;  Laterality: Left;   Social History   Social History  . Marital Status: Married    Spouse Name: N/A  . Number of Children: 0  . Years of Education: N/A   Occupational History  . Retired from state of [employer]  . Smoking status: Former Smoker -- 1.00 packs/day for 15 years    Types: Cigarettes    Quit date: 10/26/1983  . Smokeless tobacco: Never Used  . Alcohol Use: No  . Drug Use: No  . Sexual Activity: Not Asked   Other Topics Concern  . None   Social History Narrative   Family History  Problem Relation Age of Onset  . Hypertension Mother   . Heart failure Mother   . Heart disease Mother   . Peripheral vascular disease Father   . Lung cancer Father   . Cancer Father     lung  . Hypertension Sister   . Colon cancer Neg Hx   . Heart attack Maternal Uncle    Allergies  Allergen Reactions  . Meperidine Hcl Other (See Comments)    REACTION: severe headache  .  Demerol Other (See Comments)    Severe headache.  . Pseudoephedrine Other (See Comments)    Hypertension   . Sulfa Antibiotics Other (See Comments)    Happened when he was child. Does not remember reaction.   Prior to Admission medications   Medication Sig Start Date End Date Taking? Authorizing Provider  acetaminophen (TYLENOL) 500 MG tablet Take 500 mg by mouth every 6 (six) hours as needed.   Yes Historical Provider, MD  ALPRAZolam Duanne Moron) 0.5 MG tablet Take 0.5 mg by mouth 2 (two) times daily as needed. For anxiety   Yes Historical Provider, MD  amLODipine (NORVASC) 5 MG tablet Take 1 tablet (5 mg total) by mouth daily. 05/05/15  Yes Liliane Shi, PA-C  aspirin 81 MG tablet Take 81 mg by mouth daily.     Yes Historical Provider, MD  atenolol (TENORMIN) 100 MG tablet Take 100 mg by mouth daily.    Yes Historical Provider, MD  atorvastatin (LIPITOR) 10 MG tablet Take 10 mg by mouth daily.     Yes Historical Provider, MD    clidinium-chlordiazePOXIDE (LIBRAX) 2.5-5 MG per capsule Take 1 capsule by mouth 3 (three) times daily as needed. For IBS symptoms   Yes Historical Provider, MD  clopidogrel (PLAVIX) 75 MG tablet Take 1 tablet (75 mg total) by mouth daily. 03/26/15  Yes Burnell Blanks, MD  diphenhydrAMINE (BENADRYL) 25 MG tablet Take 25 mg by mouth at bedtime.    Yes Historical Provider, MD  docusate sodium (COLACE) 100 MG capsule Take 100 mg by mouth 2 (two) times daily as needed.    Yes Historical Provider, MD  hydrochlorothiazide (HYDRODIURIL) 25 MG tablet Take 25 mg by mouth daily.   Yes Historical Provider, MD  ibuprofen (ADVIL,MOTRIN) 200 MG tablet Take 200 mg by mouth every 8 (eight) hours as needed. For pain    Yes Historical Provider, MD  levothyroxine (SYNTHROID, LEVOTHROID) 125 MCG tablet Take 125 mcg by mouth daily. 02/10/15  Yes Historical Provider, MD  nitroGLYCERIN (NITROSTAT) 0.4 MG SL tablet Place 1 tablet (0.4 mg total) under the tongue every 5 (five) minutes as needed. For chest pain 03/16/14  Yes Burnell Blanks, MD  omeprazole (PRILOSEC) 20 MG capsule Take 20 mg by mouth daily.     Yes Historical Provider, MD  OVER THE COUNTER MEDICATION Take 1 tablet by mouth every 6 (six) hours as needed. Cold Symptom Relief   Yes Historical Provider, MD  OVER THE COUNTER MEDICATION Take 1 tablet by mouth at bedtime as needed. Coriciden HBP cough expectorant suppressant   Yes Historical Provider, MD  Polyethylene Glycol 3350 (MIRALAX PO) Take by mouth as needed.    Yes Historical Provider, MD  PROTOPIC 0.1 % ointment Apply 1 application topically daily as needed. For psoriasis 09/06/11  Yes Historical Provider, MD  Psyllium (METAMUCIL PO) Take by mouth as needed.    Yes Historical Provider, MD  simethicone (MYLICON) 267 MG chewable tablet Chew 125 mg by mouth every 6 (six) hours as needed for flatulence.   Yes Historical Provider, MD  valACYclovir (VALTREX) 500 MG tablet Take 500 mg by mouth 2 (two)  times daily as needed. 12/22/14  Yes Historical Provider, MD     ROS: The patient denies fevers, chills, night sweats, unintentional weight loss, chest pain, palpitations, wheezing, dyspnea on exertion, nausea, vomiting, abdominal pain, dysuria, hematuria, melena, numbness, weakness, or tingling.   All other systems have been reviewed and were otherwise negative with the exception of those mentioned  in the HPI and as above.    PHYSICAL EXAM: Filed Vitals:   02/06/16 1612  BP: 118/72  Pulse: 95  Temp: 99.4 F (37.4 C)  Resp: 18   Body mass index is 30.1 kg/(m^2).   General: Alert, no acute distress HEENT:  Normocephalic, atraumatic, oropharynx patent. EOMI, PERRLA No oral mucosa gingival  Cardiovascular:  Regular rate and rhythm, no rubs murmurs or gallops.  No Carotid bruits, radial pulse intact. No pedal edema.  Respiratory: Clear to auscultation bilaterally.  No wheezes, rales, or rhonchi.  No cyanosis, no use of accessory musculature Abdominal: No organomegaly, abdomen is soft and non-tender, positive bowel sounds. No masses. Skin: No rashes. Neurologic: Facial musculature symmetric. Psychiatric: Patient acts appropriately throughout our interaction. Lymphatic: No cervical or submandibular lymphadenopathy Musculoskeletal: Gait intact. No edema, tenderness   LABS: Results for orders placed or performed in visit on 02/06/16  POCT CBC  Result Value Ref Range   WBC 6.4 4.6 - 10.2 K/uL   Lymph, poc 1.7 0.6 - 3.4   POC LYMPH PERCENT 26.7 10 - 50 %L   MID (cbc) 0.6 0 - 0.9   POC MID % 8.6 0 - 12 %M   POC Granulocyte 4.1 2 - 6.9   Granulocyte percent 64.7 37 - 80 %G   RBC 5.40 4.69 - 6.13 M/uL   Hemoglobin 15.4 14.1 - 18.1 g/dL   HCT, POC 46.2 43.5 - 53.7 %   MCV 85.5 80 - 97 fL   MCH, POC 28.6 27 - 31.2 pg   MCHC 33.4 31.8 - 35.4 g/dL   RDW, POC 15.7 %   Platelet Count, POC 190 142 - 424 K/uL   MPV 7.5 0 - 99.8 fL     EKG/XRAY:   Primary read interpreted by Dr. Marin Comment  at [facility]. Neg chest xray    ASSESSMENT/PLAN: Encounter Diagnoses  Name Primary?  . Cough   . Blood-tinged sputum Yes  . Essential hypertension   . Abnormal chest x-ray    Labs pending Will get CT scan at the request of radiology report see below abnormal xray Fu after CT scan  Gross sideeffects, risk and benefits, and alternatives of medications d/w patient. Patient is aware that all medications have potential sideeffects and we are unable to predict every sideeffect or drug-drug interaction that may occur.  Max Fickle  DO  02/07/2016 9:26 AM   FINDINGS: Cardiac shadow is at the upper limits of normal in size. The lungs are mildly hyperinflated and stable. Slight increased density is noted of the posterior costophrenic angle on the lateral projection only. This may represent some atelectatic changes. Patchy area of increased density is noted in the left mid lung which may represent early infiltrate.  IMPRESSION: Patchy changes in the left mid lung. Given the patient's clinical symptomatology, CT of the chest may be warranted.  Questionable infiltrative density overlying the posterior costophrenic angle on the lateral projection.  These results will be called to the ordering clinician or representative by the Radiologist Assistant, and communication documented in the PACS or zVision Dashboard.   Electronically Signed  By: Inez Catalina M.D.  On: 02/06/2016 17:42

## 2016-02-07 LAB — COMPLETE METABOLIC PANEL WITHOUT GFR
AST: 58 U/L — ABNORMAL HIGH (ref 10–35)
Albumin: 4.3 g/dL (ref 3.6–5.1)
CO2: 24 mmol/L (ref 20–31)
Creat: 1.3 mg/dL — ABNORMAL HIGH (ref 0.70–1.25)
Potassium: 4.1 mmol/L (ref 3.5–5.3)

## 2016-02-07 LAB — COMPLETE METABOLIC PANEL WITH GFR
ALT: 68 U/L — ABNORMAL HIGH (ref 9–46)
Alkaline Phosphatase: 92 U/L (ref 40–115)
BUN: 17 mg/dL (ref 7–25)
Calcium: 10.2 mg/dL (ref 8.6–10.3)
Chloride: 104 mmol/L (ref 98–110)
GFR, Est African American: 65 mL/min (ref >=60)
GFR, Est Non African American: 56 mL/min — ABNORMAL LOW (ref >=60)
Glucose, Bld: 78 mg/dL (ref 65–99)
Sodium: 139 mmol/L (ref 135–146)
Total Bilirubin: 0.8 mg/dL (ref 0.2–1.2)
Total Protein: 7.4 g/dL (ref 6.1–8.1)

## 2016-02-08 ENCOUNTER — Ambulatory Visit
Admission: RE | Admit: 2016-02-08 | Discharge: 2016-02-08 | Disposition: A | Discharge status: Home / Self Care | Payer: Medicare Other | Source: Ambulatory Visit | Attending: Family Medicine | Admitting: Family Medicine

## 2016-02-08 ENCOUNTER — Telehealth: Payer: Self-pay | Admitting: Family Medicine

## 2016-02-08 DIAGNOSIS — I10 Essential (primary) hypertension: Secondary | ICD-10-CM

## 2016-02-08 DIAGNOSIS — R042 Hemoptysis: Secondary | ICD-10-CM

## 2016-02-08 DIAGNOSIS — R9389 Abnormal findings on diagnostic imaging of other specified body structures: Secondary | ICD-10-CM

## 2016-02-08 DIAGNOSIS — R059 Cough, unspecified: Secondary | ICD-10-CM

## 2016-02-08 DIAGNOSIS — R05 Cough: Secondary | ICD-10-CM

## 2016-02-08 MED ORDER — IOPAMIDOL (ISOVUE-300) INJECTION 61%
60.0000 mL | Freq: Once | INTRAVENOUS | Status: AC | PRN
  Administered 2016-02-08: 60 mL via INTRAVENOUS

## 2016-02-08 NOTE — Telephone Encounter (Signed)
Spoke to patient about labs, needs to recheck liver enzymes in 1 month, he is on lipitor, he does not take tylenol, had a 6 pack of bee the night before since [place] the beach on [place]

## 2016-02-08 NOTE — Telephone Encounter (Signed)
LM to call me back about labs and also CT scan that was ordered but not done?

## 2016-02-08 NOTE — Telephone Encounter (Signed)
Spoke to patient about CT reuslts, will need to discuss with PCP and also urology , he is not having any fevers, chills, cough, hemoptysis, night sweats, etc indicating need for abx, cbc was normal.

## 2016-02-09 NOTE — Telephone Encounter (Signed)
Spoke with Dr. [name], he is going to handle the referrals and also follow-up for Bradley Irwin. Labs and imaging studies faxed over. Spoke with patient about plans.

## 2016-02-15 ENCOUNTER — Ambulatory Visit
Admission: RE | Admit: 2016-02-15 | Discharge: 2016-02-15 | Disposition: A | Discharge status: Home / Self Care | Payer: Medicare Other | Source: Ambulatory Visit | Attending: Internal Medicine | Admitting: Internal Medicine

## 2016-02-15 ENCOUNTER — Other Ambulatory Visit: Payer: Self-pay | Admitting: Internal Medicine

## 2016-02-15 DIAGNOSIS — R0989 Other specified symptoms and signs involving the circulatory and respiratory systems: Secondary | ICD-10-CM

## 2016-03-08 ENCOUNTER — Encounter: Payer: Self-pay | Admitting: Family Medicine

## 2016-03-21 ENCOUNTER — Other Ambulatory Visit: Payer: Self-pay | Admitting: Cardiovascular Disease

## 2016-03-22 ENCOUNTER — Telehealth: Payer: Self-pay | Admitting: *Deleted

## 2016-03-22 NOTE — Telephone Encounter (Signed)
He has been on these medications for awhile. OK to continue and refill

## 2016-03-22 NOTE — Telephone Encounter (Signed)
Pharmacist aware.

## 2016-03-22 NOTE — Telephone Encounter (Signed)
Pharmacist called and stated that the omeprazole interacts with the clopidogrel. They want to be sure that Dr Angelena Form is aware of this. Looks like he has been on the two medications together for a while. Is this still okay? Please advise. Thanks, MI

## 2016-03-27 ENCOUNTER — Other Ambulatory Visit: Payer: Self-pay | Admitting: Internal Medicine

## 2016-03-27 ENCOUNTER — Ambulatory Visit
Admission: RE | Admit: 2016-03-27 | Discharge: 2016-03-27 | Disposition: A | Discharge status: Home / Self Care | Payer: Medicare Other | Source: Ambulatory Visit | Attending: Internal Medicine | Admitting: Internal Medicine

## 2016-03-27 DIAGNOSIS — Z09 Encounter for follow-up examination after completed treatment for conditions other than malignant neoplasm: Secondary | ICD-10-CM

## 2016-05-08 ENCOUNTER — Other Ambulatory Visit: Payer: Self-pay | Admitting: Internal Medicine

## 2016-05-08 ENCOUNTER — Ambulatory Visit
Admission: RE | Admit: 2016-05-08 | Discharge: 2016-05-08 | Disposition: A | Discharge status: Home / Self Care | Payer: Medicare Other | Source: Ambulatory Visit | Attending: Internal Medicine | Admitting: Internal Medicine

## 2016-05-08 DIAGNOSIS — Z09 Encounter for follow-up examination after completed treatment for conditions other than malignant neoplasm: Secondary | ICD-10-CM

## 2016-05-11 ENCOUNTER — Other Ambulatory Visit: Payer: Self-pay | Admitting: Internal Medicine

## 2016-05-11 DIAGNOSIS — R911 Solitary pulmonary nodule: Secondary | ICD-10-CM

## 2016-05-18 ENCOUNTER — Ambulatory Visit
Admission: RE | Admit: 2016-05-18 | Discharge: 2016-05-18 | Disposition: A | Discharge status: Home / Self Care | Payer: Medicare Other | Source: Ambulatory Visit | Attending: Internal Medicine | Admitting: Internal Medicine

## 2016-05-18 DIAGNOSIS — R911 Solitary pulmonary nodule: Secondary | ICD-10-CM

## 2016-05-18 MED ORDER — IOPAMIDOL (ISOVUE-300) INJECTION 61%
75.0000 mL | Freq: Once | INTRAVENOUS | Status: AC | PRN
  Administered 2016-05-18: 75 mL via INTRAVENOUS

## 2016-06-14 NOTE — Progress Notes (Signed)
Chief Complaint  Patient presents with  . Coronary Artery Disease    Denies sob,cp,lee,or claudication  . Hypertension  . Hyperlipidemia     History of Present Illness: 70 yo male with history of CAD, HTN, HLD, renal cell carcinoma s/p right nephrectomy here today for cardiac follow up. He has been followed in the past by Dr. Lia Foyer. Cardiac cath February 2006 with a 2.75 x 28 mm Costar study stent (DES) placed in the mid LAD. Last cath November 2006 with patent mid LAD stent, 40% proximal LAD stenosis, 80% small diagonal branch stenosis, minimal disease in the Circumflex and RCA. Echo 03/18/13 with normal LV function, mild LVH. Nuclear stress test May 2016 with no evidence of ischemia. He has had recent hemoptysis leading to CT chest showing possible lung cancer. Workup is underway.   He is doing well. No chest pain or SOB. He is worried about his lung disease. No LE  Primary Care Physician: Criselda Peaches, MD  Past Medical History  Diagnosis Date  . Coronary artery disease   . Hyperlipidemia   . PVD (peripheral vascular disease) ([place])   . Renal cell cancer ([place])   . GERD (gastroesophageal reflux disease)   . IBS (irritable bowel syndrome)   . Hypertension   . Hearing loss   . Rash   . Eczema   . Psoriasis   . Anxiety   . Wears hearing aid     both ears  . Wears glasses   . Hx of cardiovascular stress test     Lexiscan Myoview 5/16:  The study is normal. No ischemia identified. This is a low risk study. Overall left ventricular systolic function was normal. LV cavity size is normal. The left ventricular ejection fraction is normal (55-65%). Sensitivity reduced by bowel loop attenuation artifact.    Past Surgical History  Procedure Laterality Date  . Tonsillectomy    . Finger surgery  70 y/o    cut index rt  . Colonoscopy    . Coronary stent placement  2006  . Nephrectomy  2008    right  . Liver biopsy  09/18/11  . Liver lobectomy  10/26/11  . Cholecystectomy   10/26/11  . Lipoma excision Left 11/18/2013    Procedure: EXCISION POSTERIOR NECK LIPOMA ;  Surgeon: Stark Klein, MD;  Location: [facility];  Service: General;  Laterality: Left;    Current Outpatient Prescriptions  Medication Sig Dispense Refill  . acetaminophen (TYLENOL) 500 MG tablet Take 500 mg by mouth every 6 (six) hours as needed for mild pain, fever or headache.     . ALPRAZolam (XANAX) 0.5 MG tablet Take 0.5 mg by mouth 2 (two) times daily as needed. For anxiety    . amLODipine (NORVASC) 5 MG tablet Take 1 tablet (5 mg total) by mouth daily. 30 tablet 11  . aspirin 81 MG tablet Take 81 mg by mouth daily.      Marland Kitchen atenolol (TENORMIN) 100 MG tablet Take 100 mg by mouth daily.     Marland Kitchen atorvastatin (LIPITOR) 10 MG tablet Take 10 mg by mouth daily.      . clopidogrel (PLAVIX) 75 MG tablet TAKE 1 TABLET (75 MG TOTAL) BY MOUTH DAILY. 90 tablet 0  . diphenhydrAMINE (BENADRYL) 25 MG tablet Take 25 mg by mouth at bedtime.     . docusate sodium (COLACE) 100 MG capsule Take 100 mg by mouth 2 (two) times daily as needed for mild constipation.     Marland Kitchen  hydrochlorothiazide (HYDRODIURIL) 25 MG tablet Take 25 mg by mouth daily.    Marland Kitchen ibuprofen (ADVIL,MOTRIN) 200 MG tablet Take 200 mg by mouth every 8 (eight) hours as needed. For pain     . nitroGLYCERIN (NITROSTAT) 0.4 MG SL tablet Place 1 tablet (0.4 mg total) under the tongue every 5 (five) minutes as needed. For chest pain 25 tablet 6  . omeprazole (PRILOSEC) 20 MG capsule Take 20 mg by mouth daily.      Marland Kitchen OVER THE COUNTER MEDICATION Take 1 tablet by mouth every 6 (six) hours as needed. Cold Symptom Relief    . OVER THE COUNTER MEDICATION Take 1 tablet by mouth at bedtime as needed. Coriciden HBP cough expectorant suppressant    . Polyethylene Glycol 3350 (MIRALAX PO) Take 17 g by mouth daily as needed (constipation).     Marland Kitchen PROTOPIC 0.1 % ointment Apply 1 application topically daily as needed. For psoriasis    . Psyllium (METAMUCIL PO) Take  1 Dose by mouth daily as needed (constipation).     . simethicone (MYLICON) 024 MG chewable tablet Chew 125 mg by mouth every 6 (six) hours as needed for flatulence.    . valACYclovir (VALTREX) 500 MG tablet Take 500 mg by mouth 2 (two) times daily as needed (fever blisters).   0  . levothyroxine (SYNTHROID, LEVOTHROID) 137 MCG tablet Take 137 mcg by mouth daily.  3   No current facility-administered medications for this visit.    Allergies  Allergen Reactions  . Meperidine Hcl Other (See Comments)    REACTION: severe headache  . Demerol Other (See Comments)    Severe headache.  . Pseudoephedrine Other (See Comments)    Hypertension   . Sulfa Antibiotics Other (See Comments)    Happened when he was child. Does not remember reaction.    Social History   Social History  . Marital Status: Married    Spouse Name: N/A  . Number of Children: 0  . Years of Education: N/A   Occupational History  . Retired from state of [employer]  . Smoking status: Former Smoker -- 1.00 packs/day for 15 years    Types: Cigarettes    Quit date: 10/26/1983  . Smokeless tobacco: Never Used  . Alcohol Use: No  . Drug Use: No  . Sexual Activity: Not on file   Other Topics Concern  . Not on file   Social History Narrative    Family History  Problem Relation Age of Onset  . Hypertension Mother   . Heart failure Mother   . Heart disease Mother   . Peripheral vascular disease Father   . Lung cancer Father   . Cancer Father     lung  . Hypertension Sister   . Colon cancer Neg Hx   . Heart attack Maternal Uncle     Review of Systems:  As stated in the HPI and otherwise negative.   BP 154/86 mmHg  Pulse 68  Ht '5\' 6"'$  (1.676 m)  Wt 183 lb 12.8 oz (83.371 kg)  BMI 29.68 kg/m2  Physical Examination: General: Well developed, well nourished, NAD HEENT: OP clear, mucus membranes moist SKIN: warm, dry. No rashes. Neuro: No focal deficits Musculoskeletal: Muscle  strength 5/5 all ext Psychiatric: Mood and affect normal Neck: No JVD, no carotid bruits, no thyromegaly, no lymphadenopathy. Lungs:Clear bilaterally, no wheezes, rhonci, crackles Cardiovascular: Regular rate and rhythm. No murmurs, gallops or rubs. Abdomen:Soft. Bowel sounds present. Non-tender.  Extremities: No lower extremity edema. Pulses are 2 + in the bilateral DP/PT.  Cardiac cath 11/02/05: 1. The left main coronary is free of critical disease.  2. The left anterior descending artery demonstrates about an area of about  30-40% narrowing segmentally in the proximal LAD overlapping the origin  of the diagonal. The diagonal is moderate in distribution, but is fairly  small in caliber. It has an 80% ostial stenosis. Compared to the  previous study there is perhaps very mild progression of disease in the  proximal LAD. The mid vessel, where the stent was previously placed  remains widely patent with excellent runoff into the distal vessel. The  distal LAD is free of critical disease.  3. The circumflex is large-caliber vessel with mild proximal irregularity  overlapping the origin of the first marginal branch. Importantly, after  the administration of intracoronary nitroglycerin this area looked more  like 30-40% due to vasodilatation of the distal vessel. However, none  of this appeared to be flow-limiting and was fairly large in caliber.  4. The right coronary artery is a large-caliber vessel. There is perhaps  mild irregularity in the mid vessel but no areas of high-grade disease.  5. Ventriculography in the RAO projection reveals vigorous global systolic  function without segmental wall motion abnormality.  Echo 03/18/13: Left ventricle: The cavity size was normal. Wall thickness was increased in a pattern of mild LVH. There was mild concentric hypertrophy. Systolic function was normal. The estimated ejection fraction was in the range of 60% to 65%. Wall motion was normal; there  were no regional wall motion abnormalities. Left ventricular diastolic function parameters were normal. - Aortic valve: Trivial regurgitation.  EKG:  EKG is  ordered today. This shows NSR, rate 68 bpm. Non-specific ST and T wave abnormality.   Recent Labs: 02/06/2016: ALT 68*; BUN 17; Creat 1.30*; Hemoglobin 15.4; Potassium 4.1; Sodium 139   Lipid Panel Followed in primary care   Wt Readings from Last 3 Encounters:  06/15/16 183 lb 12.8 oz (83.371 kg)  02/06/16 192 lb 3.2 oz (87.181 kg)  05/18/15 174 lb (78.926 kg)     Other studies Reviewed: Additional studies/ records that were reviewed today include. Review of the above records demonstrates:  Assessment and Plan:   1. CAD: Stable. He is on a statin, beta blocker and ASA/Plavix (First generation DES). Nuclear stress test May 2016 with no ischemia. No changes today.   2. HTN: BP is slightly elevated today. We reviewed his meds. He thinks he is taking Norvasc 10 mg at home. Will continue current meds.  He has one kidney and baseline creatinine is 1.3. Will not add ARB or Ace-inh today. If BP continues to run consistently above 140 at home, he will alert primary care.   3. HLD: He is on a statin. Lipids followed in primary care  Current medicines are reviewed at length with the patient today.  The patient does not have concerns regarding medicines.  The following changes have been made:  no change  Labs/ tests ordered today include:  Orders Placed This Encounter  Procedures  . EKG 12-Lead    Disposition:   FU with me in 12  months  Signed, [name], MD 06/15/2016 10:14 AM    [facility] [place], [place], [place]  [address] Phone: [phone]; Fax: [phone]

## 2016-06-15 ENCOUNTER — Encounter: Payer: Self-pay | Admitting: Cardiovascular Disease

## 2016-06-15 ENCOUNTER — Ambulatory Visit (INDEPENDENT_AMBULATORY_CARE_PROVIDER_SITE_OTHER): Payer: Medicare Other | Admitting: Cardiovascular Disease

## 2016-06-15 VITALS — BP 154/86 | HR 68 | Ht 66.0 in | Wt 183.8 lb

## 2016-06-15 DIAGNOSIS — I2510 Atherosclerotic heart disease of native coronary artery without angina pectoris: Secondary | ICD-10-CM | POA: Diagnosis not present

## 2016-06-15 DIAGNOSIS — E785 Hyperlipidemia, unspecified: Secondary | ICD-10-CM | POA: Diagnosis not present

## 2016-06-15 DIAGNOSIS — I10 Essential (primary) hypertension: Secondary | ICD-10-CM

## 2016-06-15 NOTE — Patient Instructions (Signed)

## 2016-06-18 ENCOUNTER — Other Ambulatory Visit: Payer: Self-pay | Admitting: Cardiovascular Disease

## 2016-07-17 ENCOUNTER — Other Ambulatory Visit: Payer: Self-pay | Admitting: Internal Medicine

## 2016-07-17 DIAGNOSIS — R918 Other nonspecific abnormal finding of lung field: Secondary | ICD-10-CM

## 2016-07-24 ENCOUNTER — Other Ambulatory Visit: Payer: Medicare Other

## 2016-07-24 ENCOUNTER — Ambulatory Visit
Admission: RE | Admit: 2016-07-24 | Discharge: 2016-07-24 | Disposition: A | Discharge status: Home / Self Care | Payer: Medicare Other | Source: Ambulatory Visit | Attending: Internal Medicine | Admitting: Internal Medicine

## 2016-07-24 DIAGNOSIS — R918 Other nonspecific abnormal finding of lung field: Secondary | ICD-10-CM

## 2016-07-24 MED ORDER — IOPAMIDOL (ISOVUE-300) INJECTION 61%
75.0000 mL | Freq: Once | INTRAVENOUS | Status: AC | PRN
  Administered 2016-07-24: 75 mL via INTRAVENOUS

## 2017-02-14 ENCOUNTER — Other Ambulatory Visit: Payer: Self-pay | Admitting: Internal Medicine

## 2017-02-14 DIAGNOSIS — R911 Solitary pulmonary nodule: Secondary | ICD-10-CM

## 2017-02-19 ENCOUNTER — Ambulatory Visit
Admission: RE | Admit: 2017-02-19 | Discharge: 2017-02-19 | Disposition: A | Discharge status: Home / Self Care | Payer: Medicare Other | Source: Ambulatory Visit | Attending: Internal Medicine | Admitting: Internal Medicine

## 2017-02-19 DIAGNOSIS — R911 Solitary pulmonary nodule: Secondary | ICD-10-CM

## 2017-02-19 MED ORDER — IOPAMIDOL (ISOVUE-300) INJECTION 61%
75.0000 mL | Freq: Once | INTRAVENOUS | Status: AC | PRN
  Administered 2017-02-19: 75 mL via INTRAVENOUS

## 2017-05-28 ENCOUNTER — Other Ambulatory Visit: Payer: Self-pay | Admitting: Dermatology

## 2017-06-21 ENCOUNTER — Other Ambulatory Visit: Payer: Self-pay | Admitting: Cardiovascular Disease

## 2017-07-19 ENCOUNTER — Ambulatory Visit (INDEPENDENT_AMBULATORY_CARE_PROVIDER_SITE_OTHER): Payer: Medicare Other | Admitting: Cardiovascular Disease

## 2017-07-19 ENCOUNTER — Other Ambulatory Visit: Payer: Self-pay | Admitting: Cardiovascular Disease

## 2017-07-19 ENCOUNTER — Encounter: Payer: Self-pay | Admitting: Cardiovascular Disease

## 2017-07-19 VITALS — BP 136/80 | HR 77 | Resp 97 | Ht 66.0 in | Wt 181.4 lb

## 2017-07-19 DIAGNOSIS — I10 Essential (primary) hypertension: Secondary | ICD-10-CM

## 2017-07-19 DIAGNOSIS — I2510 Atherosclerotic heart disease of native coronary artery without angina pectoris: Secondary | ICD-10-CM

## 2017-07-19 DIAGNOSIS — E7800 Pure hypercholesterolemia, unspecified: Secondary | ICD-10-CM | POA: Diagnosis not present

## 2017-07-19 MED ORDER — AMLODIPINE BESYLATE 5 MG PO TABS
5.0000 mg | ORAL_TABLET | Freq: Every day | ORAL | 3 refills | Status: DC
Start: 2017-07-19 — End: 2018-08-28

## 2017-07-19 NOTE — Progress Notes (Signed)
Chief Complaint  Patient presents with  . Bleeding/Bruising     History of Present Illness: 71 yo male with history of CAD, HTN, HLD, renal cell carcinoma s/p right nephrectomy here today for cardiac follow up. He has been followed in the past by Dr. Lia Foyer. Cardiac cath February 2006 with a 2.75 x 28 mm Costar study stent (DES) placed in the mid LAD. Last cath November 2006 with patent mid LAD stent, 40% proximal LAD stenosis, 80% small diagonal branch stenosis, minimal disease in the Circumflex and RCA. Echo 03/18/13 with normal LV function, mild LVH. Nuclear stress test May 2016 with no evidence of ischemia.   He is here today for follow up. The patient denies any chest pain, dyspnea, palpitations, lower extremity edema, orthopnea, PND, dizziness, near syncope or syncope.   Primary Care Physician: [name], MD  Past Medical History:  Diagnosis Date  . Anxiety   . Coronary artery disease   . Eczema   . GERD (gastroesophageal reflux disease)   . Hearing loss   . Hx of cardiovascular stress test    Lexiscan Myoview 5/16:  The study is normal. No ischemia identified. This is a low risk study. Overall left ventricular systolic function was normal. LV cavity size is normal. The left ventricular ejection fraction is normal (55-65%). Sensitivity reduced by bowel loop attenuation artifact.  . Hyperlipidemia   . Hypertension   . IBS (irritable bowel syndrome)   . Psoriasis   . PVD (peripheral vascular disease) ([place])   . Rash   . Renal cell cancer ([place])   . Wears glasses   . Wears hearing aid    both ears    Past Surgical History:  Procedure Laterality Date  . CHOLECYSTECTOMY  10/26/11  . COLONOSCOPY    . CORONARY STENT PLACEMENT  2006  . FINGER SURGERY  71 y/o   cut index rt  . LIPOMA EXCISION Left 11/18/2013   Procedure: EXCISION POSTERIOR NECK LIPOMA ;  Surgeon: Stark Klein, MD;  Location: [facility];  Service: General;  Laterality: Left;  . LIVER BIOPSY   09/18/11  . LIVER LOBECTOMY  10/26/11  . NEPHRECTOMY  2008   right  . TONSILLECTOMY      Current Outpatient Prescriptions  Medication Sig Dispense Refill  . acetaminophen (TYLENOL) 500 MG tablet Take 500 mg by mouth every 6 (six) hours as needed for mild pain, fever or headache.     . ALPRAZolam (XANAX) 0.5 MG tablet Take 0.5 mg by mouth 2 (two) times daily as needed. For anxiety    . amLODipine (NORVASC) 5 MG tablet Take 1 tablet (5 mg total) by mouth daily. 30 tablet 11  . aspirin 81 MG tablet Take 81 mg by mouth daily.      Marland Kitchen atenolol (TENORMIN) 100 MG tablet Take 100 mg by mouth daily.     Marland Kitchen atorvastatin (LIPITOR) 10 MG tablet Take 10 mg by mouth daily.      . clopidogrel (PLAVIX) 75 MG tablet TAKE 1 TABLET BY MOUTH EVERY DAY 90 tablet 0  . diphenhydrAMINE (BENADRYL) 25 MG tablet Take 25 mg by mouth at bedtime.     . docusate sodium (COLACE) 100 MG capsule Take 100 mg by mouth 2 (two) times daily as needed for mild constipation.     . hydrochlorothiazide (HYDRODIURIL) 25 MG tablet Take 25 mg by mouth daily.    Marland Kitchen ibuprofen (ADVIL,MOTRIN) 200 MG tablet Take 200 mg by mouth every 8 (eight)  hours as needed. For pain     . levothyroxine (SYNTHROID, LEVOTHROID) 137 MCG tablet Take 137 mcg by mouth daily.  3  . nitroGLYCERIN (NITROSTAT) 0.4 MG SL tablet Place 1 tablet (0.4 mg total) under the tongue every 5 (five) minutes as needed. For chest pain 25 tablet 6  . omeprazole (PRILOSEC) 20 MG capsule Take 20 mg by mouth daily.      Marland Kitchen OVER THE COUNTER MEDICATION Take 1 tablet by mouth every 6 (six) hours as needed. Cold Symptom Relief    . OVER THE COUNTER MEDICATION Take 1 tablet by mouth at bedtime as needed. Coriciden HBP cough expectorant suppressant    . Polyethylene Glycol 3350 (MIRALAX PO) Take 17 g by mouth daily as needed (constipation).     . Psyllium (METAMUCIL PO) Take 1 Dose by mouth daily as needed (constipation).     . simethicone (MYLICON) 967 MG chewable tablet Chew 125 mg by mouth  every 6 (six) hours as needed for flatulence.    . valACYclovir (VALTREX) 500 MG tablet Take 500 mg by mouth 2 (two) times daily as needed (fever blisters).   0   No current facility-administered medications for this visit.     Allergies  Allergen Reactions  . Meperidine Hcl Other (See Comments)    REACTION: severe headache  . Demerol Other (See Comments)    Severe headache.  . Pseudoephedrine Other (See Comments)    Hypertension   . Sulfa Antibiotics Other (See Comments)    Happened when he was child. Does not remember reaction.    Social History   Social History  . Marital status: Married    Spouse name: N/A  . Number of children: 0  . Years of education: N/A   Occupational History  . Retired from state of [employer]  . Smoking status: Former Smoker    Packs/day: 1.00    Years: 15.00    Types: Cigarettes    Quit date: 10/26/1983  . Smokeless tobacco: Never Used  . Alcohol use No  . Drug use: No  . Sexual activity: Not on file   Other Topics Concern  . Not on file   Social History Narrative  . No narrative on file    Family History  Problem Relation Age of Onset  . Hypertension Mother   . Heart failure Mother   . Heart disease Mother   . Peripheral vascular disease Father   . Lung cancer Father   . Cancer Father        lung  . Hypertension Sister   . Heart attack Maternal Uncle   . Colon cancer Neg Hx     Review of Systems:  As stated in the HPI and otherwise negative.   BP 136/80   Pulse 77   Resp (!) 97   Ht 5\' 6"  (1.676 m)   Wt 181 lb 6.4 oz (82.3 kg)   BMI 29.28 kg/m   Physical Examination: General: Well developed, well nourished, NAD  HEENT: OP clear, mucus membranes moist  SKIN: warm, dry. No rashes. Neuro: No focal deficits  Musculoskeletal: Muscle strength 5/5 all ext  Psychiatric: Mood and affect normal  Neck: No JVD, no carotid bruits, no thyromegaly, no lymphadenopathy.  Lungs:Clear bilaterally, no  wheezes, rhonci, crackles Cardiovascular: Regular rate and rhythm. No murmurs, gallops or rubs. Abdomen:Soft. Bowel sounds present. Non-tender.  Extremities: No lower extremity edema. Pulses are 2 + in the bilateral DP/PT.  Cardiac  cath 11/02/05: 1. The left main coronary is free of critical disease.  2. The left anterior descending artery demonstrates about an area of about  30-40% narrowing segmentally in the proximal LAD overlapping the origin  of the diagonal. The diagonal is moderate in distribution, but is fairly  small in caliber. It has an 80% ostial stenosis. Compared to the  previous study there is perhaps very mild progression of disease in the  proximal LAD. The mid vessel, where the stent was previously placed  remains widely patent with excellent runoff into the distal vessel. The  distal LAD is free of critical disease.  3. The circumflex is large-caliber vessel with mild proximal irregularity  overlapping the origin of the first marginal branch. Importantly, after  the administration of intracoronary nitroglycerin this area looked more  like 30-40% due to vasodilatation of the distal vessel. However, none  of this appeared to be flow-limiting and was fairly large in caliber.  4. The right coronary artery is a large-caliber vessel. There is perhaps  mild irregularity in the mid vessel but no areas of high-grade disease.  5. Ventriculography in the RAO projection reveals vigorous global systolic  function without segmental wall motion abnormality.  Echo 03/18/13: Left ventricle: The cavity size was normal. Wall thickness was increased in a pattern of mild LVH. There was mild concentric hypertrophy. Systolic function was normal. The estimated ejection fraction was in the range of 60% to 65%. Wall motion was normal; there were no regional wall motion abnormalities. Left ventricular diastolic function parameters were normal. - Aortic valve: Trivial regurgitation.  EKG:   EKG is ordered today.  The EKG is reviewed by me and shows NSR, rate 77 bpm. Non-specific ST and T wave abnormality.    Recent Labs: No results found for requested labs within last 8760 hours.   Lipid Panel Followed in primary care   Wt Readings from Last 3 Encounters:  07/19/17 181 lb 6.4 oz (82.3 kg)  06/15/16 183 lb 12.8 oz (83.4 kg)  02/06/16 192 lb 3.2 oz (87.2 kg)     Other studies Reviewed: Additional studies/ records that were reviewed today include. Review of the above records demonstrates:  Assessment and Plan:   1. CAD without angina: No chest pain suggestive of angina. Will continue DAPT since he has a first generation drug eluting stent. Continue statin and beta blocker. Stress test May 2016 with no ischemia.    2. HTN: BP controlled. No changes. This is followed in primary care.   3. HLD: Continue statin. Lipids followed in primary care.   Current medicines are reviewed at length with the patient today.  The patient does not have concerns regarding medicines.  The following changes have been made:  no change  Labs/ tests ordered today include:  No orders of the defined types were placed in this encounter.   Disposition:   FU with me in 12  months  Signed, [name], MD 07/19/2017 2:46 PM    [facility] [address], [place], [place]  [address] Phone: [phone]; Fax: [phone]

## 2017-07-19 NOTE — Patient Instructions (Signed)

## 2018-08-28 ENCOUNTER — Ambulatory Visit: Payer: Medicare Other | Admitting: Cardiovascular Disease

## 2018-08-28 ENCOUNTER — Encounter: Payer: Self-pay | Admitting: Cardiovascular Disease

## 2018-08-28 VITALS — BP 132/70 | HR 64 | Ht 66.0 in | Wt 186.8 lb

## 2018-08-28 DIAGNOSIS — I2510 Atherosclerotic heart disease of native coronary artery without angina pectoris: Secondary | ICD-10-CM | POA: Diagnosis not present

## 2018-08-28 DIAGNOSIS — E7800 Pure hypercholesterolemia, unspecified: Secondary | ICD-10-CM | POA: Diagnosis not present

## 2018-08-28 DIAGNOSIS — I10 Essential (primary) hypertension: Secondary | ICD-10-CM | POA: Diagnosis not present

## 2018-08-28 NOTE — Patient Instructions (Signed)

## 2018-08-28 NOTE — Progress Notes (Signed)
Chief Complaint  Patient presents with  . Follow-up    CAD   History of Present Illness: 72 yo male with history of CAD, HTN, HLD, renal cell carcinoma s/p right nephrectomy here today for cardiac follow up. He has been followed in the past by Dr. Lia Foyer. Cardiac cath February 2006 with a 2.75 x 28 mm Costar study stent (DES) placed in the mid LAD. Last cath November 2006 with patent mid LAD stent, 40% proximal LAD stenosis, 80% small diagonal branch stenosis, minimal disease in the Circumflex and RCA. Echo 03/18/13 with normal LV function, mild LVH. Nuclear stress test May 2016 with no evidence of ischemia.   He is here today for follow up. The patient denies any chest pain, dyspnea, palpitations, lower extremity edema, orthopnea, PND, dizziness, near syncope or syncope.   Primary Care Physician: [name], MD  Past Medical History:  Diagnosis Date  . Anxiety   . Coronary artery disease   . Eczema   . GERD (gastroesophageal reflux disease)   . Hearing loss   . Hx of cardiovascular stress test    Lexiscan Myoview 5/16:  The study is normal. No ischemia identified. This is a low risk study. Overall left ventricular systolic function was normal. LV cavity size is normal. The left ventricular ejection fraction is normal (55-65%). Sensitivity reduced by bowel loop attenuation artifact.  . Hyperlipidemia   . Hypertension   . IBS (irritable bowel syndrome)   . Psoriasis   . PVD (peripheral vascular disease) ([place])   . Rash   . Renal cell cancer ([place])   . Wears glasses   . Wears hearing aid    both ears    Past Surgical History:  Procedure Laterality Date  . CHOLECYSTECTOMY  10/26/11  . COLONOSCOPY    . CORONARY STENT PLACEMENT  2006  . FINGER SURGERY  72 y/o   cut index rt  . LIPOMA EXCISION Left 11/18/2013   Procedure: EXCISION POSTERIOR NECK LIPOMA ;  Surgeon: Stark Klein, MD;  Location: [facility];  Service: General;  Laterality: Left;  . LIVER BIOPSY   09/18/11  . LIVER LOBECTOMY  10/26/11  . NEPHRECTOMY  2008   right  . TONSILLECTOMY      Current Outpatient Medications  Medication Sig Dispense Refill  . acetaminophen (TYLENOL) 500 MG tablet Take 500 mg by mouth every 6 (six) hours as needed for mild pain, fever or headache.     . ALPRAZolam (XANAX) 0.5 MG tablet Take 0.5 mg by mouth 2 (two) times daily as needed. For anxiety    . amLODipine (NORVASC) 10 MG tablet Take 10 mg by mouth daily.    Marland Kitchen aspirin 81 MG tablet Take 81 mg by mouth daily.      Marland Kitchen atenolol (TENORMIN) 100 MG tablet Take 100 mg by mouth daily.     Marland Kitchen atorvastatin (LIPITOR) 10 MG tablet Take 10 mg by mouth daily.      . chlordiazePOXIDE-Clidinium (LIBRAX PO) Take 25 mg by mouth daily.    . clopidogrel (PLAVIX) 75 MG tablet TAKE 1 TABLET BY MOUTH EVERY DAY 90 tablet 0  . diphenhydrAMINE (BENADRYL) 25 MG tablet Take 25 mg by mouth at bedtime.     . docusate sodium (COLACE) 100 MG capsule Take 100 mg by mouth 2 (two) times daily as needed for mild constipation.     . hydrochlorothiazide (HYDRODIURIL) 25 MG tablet Take 25 mg by mouth daily.    Marland Kitchen ibuprofen (ADVIL,MOTRIN)  200 MG tablet Take 200 mg by mouth every 8 (eight) hours as needed. For pain     . levothyroxine (SYNTHROID, LEVOTHROID) 137 MCG tablet Take 137 mcg by mouth daily.  3  . nitroGLYCERIN (NITROSTAT) 0.4 MG SL tablet Place 1 tablet (0.4 mg total) under the tongue every 5 (five) minutes as needed. For chest pain 25 tablet 6  . omeprazole (PRILOSEC) 20 MG capsule Take 20 mg by mouth 2 (two) times daily.     Marland Kitchen OVER THE COUNTER MEDICATION Take 1 tablet by mouth every 6 (six) hours as needed. Cold Symptom Relief    . OVER THE COUNTER MEDICATION Take 1 tablet by mouth at bedtime as needed. Coriciden HBP cough expectorant suppressant    . Polyethylene Glycol 3350 (MIRALAX PO) Take 17 g by mouth daily as needed (constipation).     . Psyllium (METAMUCIL PO) Take 1 Dose by mouth daily as needed (constipation).     .  simethicone (MYLICON) 856 MG chewable tablet Chew 125 mg by mouth every 6 (six) hours as needed for flatulence.    . valACYclovir (VALTREX) 500 MG tablet Take 500 mg by mouth 2 (two) times daily as needed (fever blisters).   0   No current facility-administered medications for this visit.     Allergies  Allergen Reactions  . Meperidine Hcl Other (See Comments)    REACTION: severe headache  . Demerol Other (See Comments)    Severe headache.  . Pseudoephedrine Other (See Comments)    Hypertension   . Sulfa Antibiotics Other (See Comments)    Happened when he was child. Does not remember reaction.    Social History   Socioeconomic History  . Marital status: Married    Spouse name: Not on file  . Number of children: 0  . Years of education: Not on file  . Highest education level: Not on file  Occupational History  . Occupation: Retired from state of [place]  Social Needs  . Financial resource strain: Not on file  . Food insecurity:    Worry: Not on file    Inability: Not on file  . Transportation needs:    Medical: Not on file    Non-medical: Not on file  Tobacco Use  . Smoking status: Former Smoker    Packs/day: 1.00    Years: 15.00    Pack years: 15.00    Types: Cigarettes    Last attempt to quit: 10/26/1983    Years since quitting: 34.8  . Smokeless tobacco: Never Used  Substance and Sexual Activity  . Alcohol use: No    Alcohol/week: 0.0 standard drinks  . Drug use: No  . Sexual activity: Not on file  Lifestyle  . Physical activity:    Days per week: Not on file    Minutes per session: Not on file  . Stress: Not on file  Relationships  . Social connections:    Talks on phone: Not on file    Gets together: Not on file    Attends religious service: Not on file    Active member of club or organization: Not on file    Attends meetings of clubs or organizations: Not on file    Relationship status: Not on file  . Intimate partner violence:    Fear of current or ex  partner: Not on file    Emotionally abused: Not on file    Physically abused: Not on file    Forced sexual activity: Not on file  Other Topics Concern  . Not on file  Social History Narrative  . Not on file    Family History  Problem Relation Age of Onset  . Hypertension Mother   . Heart failure Mother   . Heart disease Mother   . Peripheral vascular disease Father   . Lung cancer Father   . Cancer Father        lung  . Hypertension Sister   . Heart attack Maternal Uncle   . Colon cancer Neg Hx     Review of Systems:  As stated in the HPI and otherwise negative.   BP 132/70   Pulse 64   Ht 5\' 6"  (1.676 m)   Wt 186 lb 12.8 oz (84.7 kg)   SpO2 98%   BMI 30.15 kg/m   Physical Examination:  General: Well developed, well nourished, NAD  HEENT: OP clear, mucus membranes moist  SKIN: warm, dry. No rashes. Neuro: No focal deficits  Musculoskeletal: Muscle strength 5/5 all ext  Psychiatric: Mood and affect normal  Neck: No JVD, no carotid bruits, no thyromegaly, no lymphadenopathy.  Lungs:Clear bilaterally, no wheezes, rhonci, crackles Cardiovascular: Regular rate and rhythm. No murmurs, gallops or rubs. Abdomen:Soft. Bowel sounds present. Non-tender.  Extremities: No lower extremity edema. Pulses are 2 + in the bilateral DP/PT.  Cardiac cath 11/02/05: 1. The left main coronary is free of critical disease.  2. The left anterior descending artery demonstrates about an area of about  30-40% narrowing segmentally in the proximal LAD overlapping the origin  of the diagonal. The diagonal is moderate in distribution, but is fairly  small in caliber. It has an 80% ostial stenosis. Compared to the  previous study there is perhaps very mild progression of disease in the  proximal LAD. The mid vessel, where the stent was previously placed  remains widely patent with excellent runoff into the distal vessel. The  distal LAD is free of critical disease.  3. The circumflex is  large-caliber vessel with mild proximal irregularity  overlapping the origin of the first marginal branch. Importantly, after  the administration of intracoronary nitroglycerin this area looked more  like 30-40% due to vasodilatation of the distal vessel. However, none  of this appeared to be flow-limiting and was fairly large in caliber.  4. The right coronary artery is a large-caliber vessel. There is perhaps  mild irregularity in the mid vessel but no areas of high-grade disease.  5. Ventriculography in the RAO projection reveals vigorous global systolic  function without segmental wall motion abnormality.  Echo 03/18/13: Left ventricle: The cavity size was normal. Wall thickness was increased in a pattern of mild LVH. There was mild concentric hypertrophy. Systolic function was normal. The estimated ejection fraction was in the range of 60% to 65%. Wall motion was normal; there were no regional wall motion abnormalities. Left ventricular diastolic function parameters were normal. - Aortic valve: Trivial regurgitation.  EKG:  EKG is ordered today.  The EKG is reviewed by me and shows NSR, rate 64 bpm. ST and T wave abn, unchanged from 2018.   Recent Labs: No results found for requested labs within last 8760 hours.   Lipid Panel Followed in primary care   Wt Readings from Last 3 Encounters:  08/28/18 186 lb 12.8 oz (84.7 kg)  07/19/17 181 lb 6.4 oz (82.3 kg)  06/15/16 183 lb 12.8 oz (83.4 kg)     Other studies Reviewed: Additional studies/ records that were reviewed today include. Review of the  above records demonstrates:  Assessment and Plan:   1. CAD without angina: No chest pain. Will continue DAPT with ASA and Plavix given first generation drug eluting stent. Continue statin and beta blocker.     2. HTN: BP is controlled. No changes  3. HLD: Lipids followed in primary care. Continue statin.   Current medicines are reviewed at length with the patient today.  The  patient does not have concerns regarding medicines.  The following changes have been made:  no change  Labs/ tests ordered today include:  No orders of the defined types were placed in this encounter.   Disposition:   FU with me in 12  months  Signed, [name], MD 08/28/2018 2:43 PM    [address], [place], [place]  [address] Phone: [phone]; Fax: [phone]

## 2018-08-29 NOTE — Addendum Note (Signed)
Addended by: Mendel Ryder on: 08/29/2018 01:47 PM   Modules accepted: Orders

## 2018-09-02 ENCOUNTER — Telehealth: Payer: Self-pay | Admitting: Cardiovascular Disease

## 2018-09-02 NOTE — Telephone Encounter (Signed)
New Message:    Patient is requesting the office notes from the visit 9/4 be faxed to Dr. Nyoka Cowden [phone]

## 2018-09-02 NOTE — Telephone Encounter (Signed)
Note faxed. Pt notified.

## 2018-09-06 ENCOUNTER — Other Ambulatory Visit: Payer: Self-pay | Admitting: Cardiovascular Disease

## 2019-02-26 ENCOUNTER — Other Ambulatory Visit: Payer: Self-pay | Admitting: Internal Medicine

## 2019-02-26 DIAGNOSIS — D110 Benign neoplasm of parotid gland: Secondary | ICD-10-CM

## 2019-02-26 DIAGNOSIS — R911 Solitary pulmonary nodule: Secondary | ICD-10-CM

## 2019-03-05 ENCOUNTER — Ambulatory Visit
Admission: RE | Admit: 2019-03-05 | Discharge: 2019-03-05 | Disposition: A | Discharge status: Home / Self Care | Payer: Medicare Other | Source: Ambulatory Visit | Attending: Internal Medicine | Admitting: Internal Medicine

## 2019-03-05 ENCOUNTER — Other Ambulatory Visit: Payer: Self-pay

## 2019-03-05 DIAGNOSIS — R911 Solitary pulmonary nodule: Secondary | ICD-10-CM

## 2019-03-05 DIAGNOSIS — D110 Benign neoplasm of parotid gland: Secondary | ICD-10-CM

## 2019-03-05 MED ORDER — IOPAMIDOL (ISOVUE-300) INJECTION 61%
100.0000 mL | Freq: Once | INTRAVENOUS | Status: AC | PRN
  Administered 2019-03-05: 100 mL via INTRAVENOUS

## 2019-03-12 ENCOUNTER — Other Ambulatory Visit (HOSPITAL_COMMUNITY): Payer: Self-pay | Admitting: Internal Medicine

## 2019-03-12 DIAGNOSIS — C3412 Malignant neoplasm of upper lobe, left bronchus or lung: Secondary | ICD-10-CM

## 2019-03-14 ENCOUNTER — Ambulatory Visit (HOSPITAL_COMMUNITY): Payer: Medicare Other

## 2019-03-14 ENCOUNTER — Other Ambulatory Visit: Payer: Self-pay

## 2019-03-14 ENCOUNTER — Encounter (HOSPITAL_COMMUNITY)
Admission: RE | Admit: 2019-03-14 | Discharge: 2019-03-14 | Disposition: A | Discharge status: Home / Self Care | Payer: Medicare Other | Source: Ambulatory Visit | Attending: Internal Medicine | Admitting: Internal Medicine

## 2019-03-14 DIAGNOSIS — C3412 Malignant neoplasm of upper lobe, left bronchus or lung: Secondary | ICD-10-CM | POA: Insufficient documentation

## 2019-03-14 LAB — GLUCOSE, CAPILLARY: GLUCOSE-CAPILLARY: 96 mg/dL (ref 70–99)

## 2019-03-24 ENCOUNTER — Telehealth: Payer: Self-pay | Admitting: *Deleted

## 2019-03-24 DIAGNOSIS — R911 Solitary pulmonary nodule: Secondary | ICD-10-CM

## 2019-03-24 NOTE — Telephone Encounter (Signed)
Oncology Nurse Navigator Documentation  Oncology Nurse Navigator Flowsheets 03/24/2019  Navigator Location [facility]  Referral date to RadOnc/MedOnc 03/24/2019  Navigator Encounter Type Telephone/I received a message from Dr. Benay Spice that patient has been referred by Dr. Nyoka Cowden.  I called patient and updated him on appt for [place] next week.  He verbalized understanding of appt  Telephone Outgoing Call  Treatment Phase Abnormal Scans  Barriers/Navigation Needs Education;Coordination of Care  Education Other  Interventions Coordination of Care;Education  Coordination of Care Appts  Education Method Verbal  Acuity Level 2  Time Spent with Patient 30

## 2019-04-02 ENCOUNTER — Telehealth: Payer: Self-pay | Admitting: Cardiothoracic Surgery

## 2019-04-02 NOTE — Progress Notes (Signed)
[address]       [place] [address]             [phone]                    Bradley Irwin [place] Medical Record #[id number] Date of Birth: [birth year]  Referring: [name], MD Primary Care: [name], MD Primary Cardiologist: [name], MD  Chief Complaint:   Enlarging left upper lobe lung mass  History of Present Illness:          I contacted Bradley Irwin remotely due to the limitations of the current COVID pandemic on 04/02/2019  at  1:49 PM  verifying that I was speaking to Bradley Irwin whose birthday is 05/16/1946.   I discussed limitations of the evaluation and management  of patients remotely without  the benefit of physical exam.  The patient was agreeable with proceeding with a remote/ not face to face visit.  The patient gives a remote history of smoking but quit in  November 1985 and 2017 the patient had a CT scan of the chest in February May and June of that year initially was done for follow-up for his renal carcinoma.  He had no specific pulmonary symptoms at that time.  A left upper lobe lung lesion was present at that time and appeared fairly stable over the time.  A follow-up CT scan of the chest was done in 2018 and again in March 2020.  Needle area in the left upper lobe appeared to enlarge, somewhat multiloculated associated with a cystic area.  Approximate 1.4 cm in size.  Follow-up PET scan was done and showed the area to be hypermetabolic.   Patient has a renal cell carcinoma s/p right nephrectomy-resected by Dr. [name] in January 2008  Patient has a history of CAD, HTN, HLD,Cardiac cath February 2006 with a 2.75 x 28 mm Costar study stent (DES) placed in the mid LAD. Last cath November 2006 with patent mid LAD stent, 40% proximal LAD stenosis, 80% small diagonal branch stenosis, minimal disease in the Circumflex and RCA. Echo 03/18/13 with normal LV function, mild LVH. Nuclear stress test May 2016 with no  evidence of ischemia.   Laparoscopic partial hepatectomy 10/2011-benign focal hyperplasia no malignancy of the liver noted  Is relatively active with out angina and usual daily activities, he notes that if he was to run he would likely have shortness of breath and chest discomfort.  He has not had PFTs done previously  Family history is significant for father who died of lung cancer in the 48s  Current Activity/ Functional Status:  Patient is independent with mobility/ambulation, transfers, ADL's, IADL's.   Zubrod Score: At the time of surgery this patients most appropriate activity status/level should be described as: []     0    Normal activity, no symptoms [x]     1    Restricted in physical strenuous activity but ambulatory, able to do out light work []     2    Ambulatory and capable of self care, unable to do work activities, up and about               >50 % of waking hours                              []     3    Only limited  self care, in bed greater than 50% of waking hours []     4    Completely disabled, no self care, confined to bed or chair []     5    Moribund   Past Medical History:  Diagnosis Date   Anxiety    Coronary artery disease    Eczema    GERD (gastroesophageal reflux disease)    Hearing loss    Hx of cardiovascular stress test    Lexiscan Myoview 5/16:  The study is normal. No ischemia identified. This is a low risk study. Overall left ventricular systolic function was normal. LV cavity size is normal. The left ventricular ejection fraction is normal (55-65%). Sensitivity reduced by bowel loop attenuation artifact.   Hyperlipidemia    Hypertension    IBS (irritable bowel syndrome)    Psoriasis    PVD (peripheral vascular disease) ([place])    Rash    Renal cell cancer ([place])    Wears glasses    Wears hearing aid    both ears    Past Surgical History:  Procedure Laterality Date   CHOLECYSTECTOMY  10/26/11   COLONOSCOPY     CORONARY  STENT PLACEMENT  2006   FINGER SURGERY  73 y/o   cut index rt   LIPOMA EXCISION Left 11/18/2013   Procedure: EXCISION POSTERIOR NECK LIPOMA ;  Surgeon: Stark Klein, MD;  Location: [facility];  Service: General;  Laterality: Left;   LIVER BIOPSY  09/18/11   LIVER LOBECTOMY  10/26/11   NEPHRECTOMY  2008   right   TONSILLECTOMY      Family History  Problem Relation Age of Onset   Hypertension Mother    Heart failure Mother    Heart disease Mother    Peripheral vascular disease Father    Lung cancer Father    Cancer Father        lung   Hypertension Sister    Heart attack Maternal Uncle    Colon cancer Neg Hx      Social History   Tobacco Use  Smoking Status Former Smoker   Packs/day: 1.00   Years: 15.00   Pack years: 15.00   Types: Cigarettes   Last attempt to quit: 10/26/1983   Years since quitting: 35.4  Smokeless Tobacco Never Used    Social History   Substance and Sexual Activity  Alcohol Use No   Alcohol/week: 0.0 standard drinks     Allergies  Allergen Reactions   Meperidine Hcl Other (See Comments)    REACTION: severe headache   Demerol Other (See Comments)    Severe headache.   Pseudoephedrine Other (See Comments)    Hypertension    Sulfa Antibiotics Other (See Comments)    Happened when he was child. Does not remember reaction.    Current Outpatient Medications  Medication Sig Dispense Refill   acetaminophen (TYLENOL) 500 MG tablet Take 500 mg by mouth every 6 (six) hours as needed for mild pain, fever or headache.      ALPRAZolam (XANAX) 0.5 MG tablet Take 0.5 mg by mouth 2 (two) times daily as needed. For anxiety     amLODipine (NORVASC) 10 MG tablet Take 10 mg by mouth daily.     aspirin 81 MG tablet Take 81 mg by mouth daily.       atenolol (TENORMIN) 100 MG tablet Take 100 mg by mouth daily.      atorvastatin (LIPITOR) 10 MG tablet Take 10 mg by mouth daily.  chlordiazePOXIDE-Clidinium  (LIBRAX PO) Take 25 mg by mouth daily.     clopidogrel (PLAVIX) 75 MG tablet TAKE 1 TABLET BY MOUTH EVERY DAY 90 tablet 3   diphenhydrAMINE (BENADRYL) 25 MG tablet Take 25 mg by mouth at bedtime.      docusate sodium (COLACE) 100 MG capsule Take 100 mg by mouth 2 (two) times daily as needed for mild constipation.      hydrochlorothiazide (HYDRODIURIL) 25 MG tablet Take 25 mg by mouth daily.     ibuprofen (ADVIL,MOTRIN) 200 MG tablet Take 200 mg by mouth every 8 (eight) hours as needed. For pain      levothyroxine (SYNTHROID, LEVOTHROID) 137 MCG tablet Take 137 mcg by mouth daily.  3   nitroGLYCERIN (NITROSTAT) 0.4 MG SL tablet Place 1 tablet (0.4 mg total) under the tongue every 5 (five) minutes as needed. For chest pain 25 tablet 6   omeprazole (PRILOSEC) 20 MG capsule Take 20 mg by mouth 2 (two) times daily.      OVER THE COUNTER MEDICATION Take 1 tablet by mouth every 6 (six) hours as needed. Cold Symptom Relief     OVER THE COUNTER MEDICATION Take 1 tablet by mouth at bedtime as needed. Coriciden HBP cough expectorant suppressant     Polyethylene Glycol 3350 (MIRALAX PO) Take 17 g by mouth daily as needed (constipation).      Psyllium (METAMUCIL PO) Take 1 Dose by mouth daily as needed (constipation).      simethicone (MYLICON) 329 MG chewable tablet Chew 125 mg by mouth every 6 (six) hours as needed for flatulence.     valACYclovir (VALTREX) 500 MG tablet Take 500 mg by mouth 2 (two) times daily as needed (fever blisters).   0   No current facility-administered medications for this visit.     Pertinent items are noted in HPI.   Review of Systems:     Cardiac Review of Systems: [Y] = yes  or   [ N ] = no   Chest Pain [  n  ]  Resting SOB [   n] Exertional SOB  [ y]  Orthopnea Florencio.Farrier  ]   Pedal Edema [ n  ]    Palpitations [ n ] Syncope  [ n ]   Presyncope [n   ]   General Review of Systems: [Y] = yes [  ]=no Constitional: recent weight change [  ];  Wt loss over the last 3  months [   ] anorexia [  ]; fatigue [  ]; nausea [  ]; night sweats [  ]; fever [  ]; or chills [  ];           Eye : blurred vision [  ]; diplopia [   ]; vision changes [  ];  Amaurosis fugax[  ]; Resp: cough [ y ];  wheezing[ y ];  hemoptysis[  ]; shortness of breath[  ]; paroxysmal nocturnal dyspnea[  ]; dyspnea on exertion[  ]; or orthopnea[  ];  GI:  gallstones[  ], vomiting[  ];  dysphagia[  ]; melena[  ];  hematochezia [  ]; heartburn[  ];   Hx of  Colonoscopy[  ]; GU: kidney stones [  ]; hematuria[  ];   dysuria [  ];  nocturia[  ];  history of     obstruction [  ]; urinary frequency [  ]             Skin: rash, swelling[  ];,  hair loss[  ];  peripheral edema[  ];  or itching[  ]; Musculosketetal: myalgias[  ];  joint swelling[  ];  joint erythema[  ];  joint pain[  ];  back pain[  ];  Heme/Lymph: bruising[  ];  bleeding[  ];  anemia[  ];  Neuro: TIA[  ];  headaches[  ];  stroke[  ];  vertigo[  ];  seizures[  ];   paresthesias[  ];  difficulty walking[  ];  Psych:depression[  ]; anxiety[  ];  Endocrine: diabetes[  ];  thyroid dysfunction[  ];  Immunizations: Flu up to date [  ]; Pneumococcal up to date [  ];  Other:     PHYSICAL EXAMINATION:  Diagnostic Studies & Laboratory data:     Recent Radiology Findings:   Ct Soft Tissue Neck W Contrast  Result Date: 03/05/2019 CLINICAL DATA:  Question parotid mass. History of lipoma resection in the neck. History of tonsillectomy. EXAM: CT NECK WITH CONTRAST TECHNIQUE: Multidetector CT imaging of the neck was performed using the standard protocol following the bolus administration of intravenous contrast. CONTRAST:  159mL ISOVUE-300 IOPAMIDOL (ISOVUE-300) INJECTION 61% COMPARISON:  None. FINDINGS: Pharynx and larynx: No mucosal or submucosal lesion. Salivary glands: Both parotid glands show fatty change and prominence. This can be seen with Sjogren syndrome. There are intraparotid lymph nodes slightly prominent similar to the other nodes in the  neck described below. I do not see a parotid finding that I thinks specifically represents a parotid neoplasm. Any anyone these intraparotid densities could possibly represent at a parotid mass, but they are all well defined and if so, would likely represent Warthin's tumor. Submandibular glands are normal. Thyroid: Diminutive thyroid tissue.  No sign of thyroid mass. Lymph nodes: Slight prominence of the lymph nodes throughout all nodal stations of the neck in a bilaterally symmetric fashion. However, none measures outside of the range of normal. Level 2 lymph node on the right shows largest short axis dimension of 11 mm, within normal limits. Largest nodes at the other nodal stations shows short axis dimensions of 9 mm, within normal limits. No low-density nodes. Vascular: Ordinary atherosclerotic change of the carotid bifurcations. Limited intracranial: Normal Visualized orbits: Limited.  Normal. Mastoids and visualized paranasal sinuses: Mucosal thickening of the maxillary sinus floors. No layering fluid. No fluid in the mastoids. Skeleton: Ordinary cervical spondylosis. Upper chest: See results of chest CT. Other: Small sebaceous cyst of the posterior neck midline. IMPRESSION: The parotid glands show enlargement and fatty change. This can be seen with Sjogren syndrome. Multiple bilateral intraparotid nodules most consistent with parotid lymph nodes. See above discussion. Not possible to exclude a parotid mass such as Warthin's tumor, but lymph nodes are favored. In general, the lymph nodes of the neck are all at the upper limits of normal in size, but are asymmetric or exceed the range of normal. Electronically Signed   By: Bradley Irwin M.D.   On: 03/05/2019 16:04   Ct Chest W Contrast  Result Date: 03/05/2019 CLINICAL DATA:  Follow up left upper lobe pulmonary nodule. History of right nephrectomy in 2008 for renal cell carcinoma. EXAM: CT CHEST WITH CONTRAST TECHNIQUE: Multidetector CT imaging of the chest  was performed during intravenous contrast administration. CONTRAST:  176mL ISOVUE-300 IOPAMIDOL (ISOVUE-300) INJECTION 61% COMPARISON:  Prior chest CTs ranging from 05/18/2016 through 02/19/2017. FINDINGS: Cardiovascular: Atherosclerosis of the aorta, great vessels and coronary arteries with a possible coronary artery stent. No acute vascular findings. The heart size is  normal. There is no pericardial effusion. Mediastinum/Nodes: Again demonstrated are multiple prominent lower cervical, axillary, mediastinal and hilar lymph nodes bilaterally, similar to the previous studies. Representative nodes include a 12 mm AP window node on image 85/5 and a 17 mm right hilar node on image 97/5. The thyroid gland, trachea and esophagus demonstrate no significant findings. Lungs/Pleura: There is no pleural effusion. There is mild centrilobular emphysema and cylindrical bronchiectasis. There has been significant progression in the soft tissue components of the previously described left upper lobe lesion. The soft tissue components now measure up to 2.0 x 1.4 cm on image 95 and have irregular margins, highly worrisome for bronchogenic carcinoma. The central cavitary component of this nodule is unchanged. Mild right upper lobe nodularity is stable. Upper abdomen: There is diffuse hepatic steatosis without focal hepatic abnormality. Complex cystic lesion medially in the spleen has not significantly changed. There is no evidence of mass in the right nephrectomy bed. Musculoskeletal/Chest wall: There is no chest wall mass or suspicious osseous finding. IMPRESSION: 1. Significant progression in the soft tissue components associated with the left upper lobe nodule, highly suspicious for bronchogenic carcinoma. Referral to multi disciplinary thoracic Oncology Clinic recommended. PET-CT may be helpful for further staging. 2. No other new or enlarging pulmonary nodules. 3. Chronic thoracic adenopathy attributed to a chronic benign process.  4. Stable findings in the upper abdomen, including hepatic steatosis and a complex cystic splenic lesion. 5. These results will be called to the ordering clinician or representative by the Radiologist Assistant, and communication documented in the PACS or zVision Dashboard. Electronically Signed   By: Bradley Irwin M.D.   On: 03/05/2019 17:27   Left lung lesion present in 2017 :   Now:   Nm Pet Image Initial (pi) Skull Base To Thigh  Result Date: 03/14/2019 CLINICAL DATA:  Initial treatment strategy for left upper lobe pulmonary nodule. History of right nephrectomy for renal cell carcinoma. EXAM: NUCLEAR MEDICINE PET SKULL BASE TO THIGH TECHNIQUE: 9.9 mCi F-18 FDG was injected intravenously. Full-ring PET imaging was performed from the skull base to thigh after the radiotracer. CT data was obtained and used for attenuation correction and anatomic localization. Fasting blood glucose: 96 mg/dl COMPARISON:  CT scan 03/05/2019 FINDINGS: Mediastinal blood pool activity: SUV max 2.62 NECK: Stable numerous scattered neck nodes including intraparotid nodes. Low level hypermetabolism noted in some of these nodes ranging between 1.5 and 2.6 SUV max. 9 mm right level 2 lymph node on image number 31 has an SUV max of 2.59. Incidental CT findings: none CHEST: Numerous small supraclavicular and borderline enlarged axillary lymph nodes are noted. These appear relatively stable when compared to the prior chest CT from 2018. 13 mm right axillary node on image number 61 has an SUV max of 2.20. 12 mm left axillary lymph node on image number 65 has an SUV max of 2.35. 12 mm lateral left axillary lymph node on image number 57 has an SUV max of 2.39. Moderate bilateral symmetric gynecomastia is noted. 10 mm prevascular lymph node on image number 70 has an SUV max of 2.35. No other enlarged or hypermetabolic mediastinal or hilar lymph nodes are identified. The 23 mm semi solid left upper lobe lung lesion is hypermetabolic with  SUV max of 4.34. This is suspicious for primary adenocarcinoma of the lung. Stable semi solid nodule in the right upper lobe on image number 22 measuring 8 mm. No hypermetabolism. Incidental CT findings: Three-vessel coronary artery calcifications. ABDOMEN/PELVIS: No abnormal hypermetabolic  activity within the liver, pancreas, adrenal glands, or spleen. Scattered small retroperitoneal and pelvic lymph nodes but no hypermetabolism. No inguinal adenopathy. Small scattered inguinal lymph nodes weakly hypermetabolic. The right kidney is surgically absent. No worrisome findings in the nephrectomy bed. Incidental CT findings: Stable splenic lesion consistent with a benign cyst. There is also diffuse fatty infiltration of the liver and evidence of prior right hepatic lobe surgery and a cholecystectomy. SKELETON: No significant findings. Incidental CT findings: none IMPRESSION: 1. Left upper lobe lung lesion is hypermetabolic and consistent with primary lung neoplasm, likely adenocarcinoma. 2. Numerous borderline bilateral neck, supraclavicular and axillary lymph nodes. Low level hypermetabolism suggesting chronic inflammatory change. 3. No enlarged or hypermetabolic mediastinal or hilar lymph nodes to suggest metastatic disease. 4. Stable 8 mm semi solid right upper lobe pulmonary nodule. Recommend continued observation/surveillance. Electronically Signed   By: Bradley Irwin M.D.   On: 03/14/2019 12:22     I have independently reviewed the above radiology studies  and reviewed the findings with the patient.   Recent Lab Findings: Lab Results  Component Value Date   WBC 6.4 02/06/2016   HGB 15.4 02/06/2016   HCT 46.2 02/06/2016   PLT 125 (L) 10/27/2011   GLUCOSE 78 02/06/2016   ALT 68 (H) 02/06/2016   AST 58 (H) 02/06/2016   NA 139 02/06/2016   K 4.1 02/06/2016   CL 104 02/06/2016   CREATININE 1.30 (H) 02/06/2016   BUN 17 02/06/2016   CO2 24 02/06/2016   INR 0.92 10/24/2011    Assessment / Plan:     #1 slowly enlarging left upper lobe lesion present on CT scan in 4401 now hypermetabolic 23 mm in size, although it has changed over several years is highly suspicious for adenocarcinoma of the lung. -Treatment options could be surgical resection versus stereotactic radiotherapy.  Currently there is no tissue diagnosis.  The patient's had no pulmonary function studies done    #2 known coronary artery disease-currently on Plavix-would need cardiac clearance prior surgical resection  I discussed with the patient the likely diagnosis based on the CT scan of slow-growing adenocarcinoma of the lung, though this still could be an inflammatory type nodule.  There is no other associated evidence that it may be a renal cell carcinoma metastasis.  If it is a lung cancer pathologically what appear to be stage I.  I discussed with the patient the treatment options of surgical resection versus stereotactic radiotherapy.  He will need formal evaluation of his pulmonary function.    I  spent 30 minutes reviewing the patient's history over the phone and discussing the possible diagnosis and the treatment options.  His case will be reviewed in the multidisciplinary thoracic oncology conference and then will further discuss with him our treatment options.  Bradley Isaac MD      [address] [place] [address] Office [phone]   Murlean Hark [phone]  04/02/2019 1:01 PM   Addendum to the note:        I contacted GURKIRAT BASHER remotely due to the limitations of the current COVID pandemic on 04/03/2019  at  9:56 AM  verifying that I was speaking to Bradley Irwin whose birthday is 06-11-1946.   I discussed limitations of the evaluation and management  of patients remotely without  the benefit of physical exam.  The patient was agreeable with proceeding with a remote/ not face to face visit.   After the multidisciplinary thoracic oncology conference this morning, I  contacted the patient  again and had a remote conference with him filling him in on the details discussed at the conference.  The patient could be a candidate for stereotactic radiotherapy, but surgical resection would be preferable including node sampling.  Radiology was somewhat concerned about CT directed needle biopsy of the lesion because of the associated cyst.   We we will contact cardiology office for preoperative clearance-known patient of Dr. Angelena Form  See the patient back with pulmonary function studies in the office in mid to late May, when PFTs can be performed and tentatively plan for surgery in early June  The patient was agreeable with this approach.

## 2019-04-03 ENCOUNTER — Other Ambulatory Visit: Payer: Self-pay | Admitting: *Deleted

## 2019-04-03 ENCOUNTER — Other Ambulatory Visit: Payer: Medicare Other

## 2019-04-03 ENCOUNTER — Other Ambulatory Visit: Payer: Self-pay

## 2019-04-03 ENCOUNTER — Institutional Professional Consult (permissible substitution) (INDEPENDENT_AMBULATORY_CARE_PROVIDER_SITE_OTHER): Payer: Medicare Other | Admitting: Cardiothoracic Surgery

## 2019-04-03 ENCOUNTER — Ambulatory Visit: Payer: Medicare Other | Admitting: Internal Medicine

## 2019-04-03 ENCOUNTER — Telehealth: Payer: Self-pay | Admitting: *Deleted

## 2019-04-03 DIAGNOSIS — R222 Localized swelling, mass and lump, trunk: Secondary | ICD-10-CM | POA: Diagnosis not present

## 2019-04-03 DIAGNOSIS — R918 Other nonspecific abnormal finding of lung field: Secondary | ICD-10-CM

## 2019-04-03 DIAGNOSIS — I2510 Atherosclerotic heart disease of native coronary artery without angina pectoris: Secondary | ICD-10-CM

## 2019-04-03 NOTE — Progress Notes (Signed)
The proposed treatment discussed in cancer conference 04/03/2019 is for discussion purpose only and is not a binding recommendation.  The patient was not physically examined nor present for their treatment options.  Therefore, final treatment plans cannot be decided.

## 2019-04-03 NOTE — Telephone Encounter (Signed)
Oncology Nurse Navigator Documentation  Oncology Nurse Navigator Flowsheets 04/03/2019  Navigator Location [facility]  Referral date to RadOnc/MedOnc -  Navigator Encounter Type Telephone/I called Bradley Irwin earlier today and updated him regarding cancer conference discussion and that his appt with Dr. Julien Nordmann was cancelled today. He verbalized understanding and was updated by Dr. Servando Snare. He was thankful for the call and update.   Telephone Outgoing Call  Treatment Phase Abnormal Scans  Barriers/Navigation Needs Education;Coordination of Care  Education Other  Interventions Coordination of Care;Education  Coordination of Care Other  Education Method Verbal  Acuity Level 2  Time Spent with Patient 30

## 2019-04-14 ENCOUNTER — Telehealth: Payer: Self-pay

## 2019-04-14 NOTE — Telephone Encounter (Signed)
-----   Message from Burnell Blanks, MD sent at 04/14/2019  2:14 PM EDT ----- I can see him by virtual visit and get him in for a nuclear study.   [place], Can you help me put him on for this week? I could see him Wednesday morning if he is available for a virtual visit.   Thanks,  Gerald Stabs ----- Message ----- From: Laury Deep, RN Sent: 04/14/2019   2:09 PM EDT To: Burnell Blanks, MD  Hey,   This is a patient of yours that Dr. Servando Snare will be seeing back 5/21 to schedule his lung surgery.  He needs cardiac clearance prior to that appt if possible.  Thx-Ryan

## 2019-04-14 NOTE — Telephone Encounter (Signed)
Called and spoke to patient and scheduled him for VIDEO Visit on 4/22 at 8:30 AM with Dr. Angelena Form.      Virtual Visit Pre-Appointment Phone Call   TELEPHONE CALL NOTE  Bradley Irwin has been deemed a candidate for a follow-up tele-health visit to limit community exposure during the Covid-19 pandemic. I spoke with the patient via phone to ensure availability of phone/video source, confirm preferred email & phone number, and discuss instructions and expectations.  I reminded WILIAM Irwin to be prepared with any vital sign and/or heart rhythm information that could potentially be obtained via home monitoring, at the time of his visit. I reminded Bradley Irwin to expect a phone call prior to his visit.  Patient agrees to consent below.  Cleon Gustin, RN 04/14/2019 3:42 PM   FULL LENGTH CONSENT FOR TELE-HEALTH VISIT   I hereby voluntarily request, consent and authorize [facility] and its employed or contracted physicians, physician assistants, nurse practitioners or other licensed health care professionals (the Practitioner), to provide me with telemedicine health care services (the "Services") as deemed necessary by the treating Practitioner. I acknowledge and consent to receive the Services by the Practitioner via telemedicine. I understand that the telemedicine visit will involve communicating with the Practitioner through live audiovisual communication technology and the disclosure of certain medical information by electronic transmission. I acknowledge that I have been given the opportunity to request an in-person assessment or other available alternative prior to the telemedicine visit and am voluntarily participating in the telemedicine visit.  I understand that I have the right to withhold or withdraw my consent to the use of telemedicine in the course of my care at any time, without affecting my right to future care or treatment, and that the Practitioner or I may  terminate the telemedicine visit at any time. I understand that I have the right to inspect all information obtained and/or recorded in the course of the telemedicine visit and may receive copies of available information for a reasonable fee.  I understand that some of the potential risks of receiving the Services via telemedicine include:  Marland Kitchen Delay or interruption in medical evaluation due to technological equipment failure or disruption; . Information transmitted may not be sufficient (e.g. poor resolution of images) to allow for appropriate medical decision making by the Practitioner; and/or  . In rare instances, security protocols could fail, causing a breach of personal health information.  Furthermore, I acknowledge that it is my responsibility to provide information about my medical history, conditions and care that is complete and accurate to the best of my ability. I acknowledge that Practitioner's advice, recommendations, and/or decision may be based on factors not within their control, such as incomplete or inaccurate data provided by me or distortions of diagnostic images or specimens that may result from electronic transmissions. I understand that the practice of medicine is not an exact science and that Practitioner makes no warranties or guarantees regarding treatment outcomes. I acknowledge that I will receive a copy of this consent concurrently upon execution via email to the email address I last provided but may also request a printed copy by calling the office of [place].    I understand that my insurance will be billed for this visit.   I have read or had this consent read to me. . I understand the contents of this consent, which adequately explains the benefits and risks of the Services being provided via telemedicine.  . I have been  provided ample opportunity to ask questions regarding this consent and the Services and have had my questions answered to my satisfaction. . I  give my informed consent for the services to be provided through the use of telemedicine in my medical care  By participating in this telemedicine visit I agree to the above.

## 2019-04-16 ENCOUNTER — Encounter: Payer: Self-pay | Admitting: *Deleted

## 2019-04-16 ENCOUNTER — Other Ambulatory Visit: Payer: Self-pay

## 2019-04-16 ENCOUNTER — Telehealth (INDEPENDENT_AMBULATORY_CARE_PROVIDER_SITE_OTHER): Payer: Medicare Other | Admitting: Cardiovascular Disease

## 2019-04-16 ENCOUNTER — Encounter: Payer: Self-pay | Admitting: Cardiovascular Disease

## 2019-04-16 ENCOUNTER — Telehealth: Payer: Medicare Other | Admitting: Cardiovascular Disease

## 2019-04-16 VITALS — BP 139/80 | HR 82 | Ht 66.0 in | Wt 185.0 lb

## 2019-04-16 DIAGNOSIS — I10 Essential (primary) hypertension: Secondary | ICD-10-CM

## 2019-04-16 DIAGNOSIS — Z01818 Encounter for other preprocedural examination: Secondary | ICD-10-CM

## 2019-04-16 DIAGNOSIS — I2510 Atherosclerotic heart disease of native coronary artery without angina pectoris: Secondary | ICD-10-CM | POA: Diagnosis not present

## 2019-04-16 DIAGNOSIS — Z01810 Encounter for preprocedural cardiovascular examination: Secondary | ICD-10-CM

## 2019-04-16 DIAGNOSIS — E7800 Pure hypercholesterolemia, unspecified: Secondary | ICD-10-CM

## 2019-04-16 NOTE — Patient Instructions (Addendum)
Medication Instructions:  No changes If you need a refill on your cardiac medications before your next appointment, please call your pharmacy.   Lab work: None today  Testing/Procedures: Your physician has requested that you have en exercise stress myoview. For further information please visit [email or web address]. Please follow instruction sheet, as given.   Follow-Up: At [facility], you and your health needs are our priority.  As part of our continuing mission to provide you with exceptional heart care, we have created designated Provider Care Teams.  These Care Teams include your primary Cardiologist (physician) and Advanced Practice Providers (APPs -  Physician Assistants and Nurse Practitioners) who all work together to provide you with the care you need, when you need it. You will need a follow up appointment in 6 months.  Please call our office 2 months in advance to schedule this appointment.  You may see [name], MD or one of the following Advanced Practice Providers on your designated Care Team:   [place], PA-C Melina Copa, PA-C . Ermalinda Barrios, PA-C  Any Other Special Instructions Will Be Listed Below (If Applicable). We will call you to arrange date/time for the stress test.   Called patient this morning and reviewed instructions for stress test.  He is aware he will be called to schedule this.

## 2019-04-16 NOTE — Progress Notes (Signed)
Virtual Visit via Video Note   This visit type was conducted due to national recommendations for restrictions regarding the COVID-19 Pandemic (e.g. social distancing) in an effort to limit this patient's exposure and mitigate transmission in our community.  Due to his co-morbid illnesses, this patient is at least at moderate risk for complications without adequate follow up.  This format is felt to be most appropriate for this patient at this time.  All issues noted in this document were discussed and addressed.  A limited physical exam was performed with this format.  Please refer to the patient's chart for his consent to telehealth for [facility].   Evaluation Performed:  Follow-up visit  Date:  04/16/2019   ID:  Bradley Irwin, DOB 1946-05-24, MRN [id number]  Patient Location: Home Provider Location: Home  PCP:  [name], MD  Cardiologist:  [name], MD  Electrophysiologist:  None   Chief Complaint:  Follow up-CAD/Need for surgery for lung mass/cardiac risk assessment pre surgery  History of Present Illness:    Bradley Irwin is a 73 y.o. male with history of CAD, HTN, HLD, renal cell carcinoma s/p right nephrectomy and new diagnosis of a lung mass who is being seen today by virtual e-visit due to the Covid 19 pandemic. He has been followed in the past by Dr. Lia Foyer. Cardiac cath February 2006 with a 2.75 x 28 mm Costar study stent (DES) placed in the mid LAD. Last cath November 2006 with patent mid LAD stent, 40% proximal LAD stenosis, 80% small diagonal branch stenosis, minimal disease in the Circumflex and RCA. Echo 03/18/13 with normal LV function, mild LVH. Nuclear stress test May 2016 with no evidence of ischemia. He has been found to have a lung mass and will need to have surgical resection of the lung mass. He was seen by Dr. Servando Snare in the [place] surgery office 04/03/19.   He tells me today that he has been feeling well. He has no chest pain or dyspnea  with normal activity. No lower extremity edema. He is very active. He has had chest pain with extreme exertion for many years. No change.   The patient does not have symptoms concerning for COVID-19 infection (fever, chills, cough, or new shortness of breath).    Past Medical History:  Diagnosis Date  . Anxiety   . Coronary artery disease   . Eczema   . GERD (gastroesophageal reflux disease)   . Hearing loss   . Hx of cardiovascular stress test    Lexiscan Myoview 5/16:  The study is normal. No ischemia identified. This is a low risk study. Overall left ventricular systolic function was normal. LV cavity size is normal. The left ventricular ejection fraction is normal (55-65%). Sensitivity reduced by bowel loop attenuation artifact.  . Hyperlipidemia   . Hypertension   . IBS (irritable bowel syndrome)   . Psoriasis   . PVD (peripheral vascular disease) ([place])   . Rash   . Renal cell cancer ([place])   . Wears glasses   . Wears hearing aid    both ears   Past Surgical History:  Procedure Laterality Date  . CHOLECYSTECTOMY  10/26/11  . COLONOSCOPY    . CORONARY STENT PLACEMENT  2006  . FINGER SURGERY  73 y/o   cut index rt  . LIPOMA EXCISION Left 11/18/2013   Procedure: EXCISION POSTERIOR NECK LIPOMA ;  Surgeon: Stark Klein, MD;  Location: [place];  Service:  General;  Laterality: Left;  . LIVER BIOPSY  09/18/11  . LIVER LOBECTOMY  10/26/11  . NEPHRECTOMY  2008   right  . TONSILLECTOMY       Current Meds  Medication Sig  . acetaminophen (TYLENOL) 500 MG tablet Take 500 mg by mouth every 6 (six) hours as needed for mild pain, fever or headache.   . ALPRAZolam (XANAX) 0.5 MG tablet Take 0.5 mg by mouth 2 (two) times daily as needed. For anxiety  . amLODipine (NORVASC) 5 MG tablet Take 5 mg by mouth daily.   Marland Kitchen aspirin 81 MG tablet Take 81 mg by mouth daily.    Marland Kitchen atenolol (TENORMIN) 100 MG tablet Take 100 mg by mouth daily.   Marland Kitchen atorvastatin (LIPITOR) 10 MG tablet  Take 10 mg by mouth daily.    . chlordiazePOXIDE-Clidinium (LIBRAX PO) Take 25 mg by mouth daily.  . clopidogrel (PLAVIX) 75 MG tablet TAKE 1 TABLET BY MOUTH EVERY DAY  . diphenhydrAMINE (BENADRYL) 25 MG tablet Take 25 mg by mouth at bedtime.   . docusate sodium (COLACE) 100 MG capsule Take 100 mg by mouth 2 (two) times daily as needed for mild constipation.   . hydrochlorothiazide (HYDRODIURIL) 25 MG tablet Take 25 mg by mouth daily.  Marland Kitchen ibuprofen (ADVIL,MOTRIN) 200 MG tablet Take 200 mg by mouth every 8 (eight) hours as needed. For pain   . levothyroxine (SYNTHROID) 125 MCG tablet Take 125 mcg by mouth as directed. 1 tablet Mon-Sat on Sundays pt takes 2 tablets daily  . nitroGLYCERIN (NITROSTAT) 0.4 MG SL tablet Place 1 tablet (0.4 mg total) under the tongue every 5 (five) minutes as needed. For chest pain  . omeprazole (PRILOSEC) 20 MG capsule Take 20 mg by mouth 2 (two) times daily.   Marland Kitchen OVER THE COUNTER MEDICATION Take 1 tablet by mouth every 6 (six) hours as needed. Cold Symptom Relief  . OVER THE COUNTER MEDICATION Take 1 tablet by mouth at bedtime as needed. [place]iden HBP cough expectorant suppressant  . Polyethylene Glycol 3350 (MIRALAX PO) Take 17 g by mouth daily as needed (constipation).   . Psyllium (METAMUCIL PO) Take 1 Dose by mouth daily as needed (constipation).   . simethicone (MYLICON) 831 MG chewable tablet Chew 125 mg by mouth every 6 (six) hours as needed for flatulence.  . valACYclovir (VALTREX) 500 MG tablet Take 500 mg by mouth 2 (two) times daily as needed (fever blisters).      Allergies:   Meperidine hcl; Demerol; Pseudoephedrine; and Sulfa antibiotics   Social History   Tobacco Use  . Smoking status: Former Smoker    Packs/day: 1.00    Years: 15.00    Pack years: 15.00    Types: Cigarettes    Last attempt to quit: 10/26/1983    Years since quitting: 35.4  . Smokeless tobacco: Never Used  Substance Use Topics  . Alcohol use: No    Alcohol/week: 0.0 standard  drinks  . Drug use: No     Family Hx: The patient's family history includes Cancer in his father; Heart attack in his maternal uncle; Heart disease in his mother; Heart failure in his mother; Hypertension in his mother and sister; Lung cancer in his father; Peripheral vascular disease in his father. There is no history of Colon cancer.  ROS:   Please see the history of present illness.    All other systems reviewed and are negative.   Prior CV studies:   The following studies were reviewed today:  Labs/Other Tests and Data Reviewed:    EKG:  No ECG reviewed.  Recent Labs: No results found for requested labs within last 8760 hours.   Recent Lipid Panel No results found for: CHOL, TRIG, HDL, CHOLHDL, LDLCALC, LDLDIRECT  Wt Readings from Last 3 Encounters:  04/16/19 185 lb (83.9 kg)  08/28/18 186 lb 12.8 oz (84.7 kg)  07/19/17 181 lb 6.4 oz (82.3 kg)     Objective:    Vital Signs:  BP 139/80   Pulse 82   Ht 5\' 6"  (1.676 m)   Wt 185 lb (83.9 kg)   BMI 29.86 kg/m    No exam.Converted to telephone visit due to pt having technical issues with his cell phone webcam.  ASSESSMENT & PLAN:    1. CAD without angina: He has no worrisome signs or symptoms of unstable angina. Will plan stress testing pre surgery. Continue ASA, Plavix, beta blocker and statin.   2. HTN: BP has been well controlled per pt.   3. HLD: Lipids followed in primary care and well controlled per pt. Will continue statin.   4. Pre-operative cardiovascular examination: He is very active achieving over 4 METS per day. No worrisome signs or symptoms of angina, CHF or arrhythmias. Given known underlying CAD, will plan an exercise nuclear stress test. Will follow results and alert CT surgery of findings.   COVID-19 Education: The signs and symptoms of COVID-19 were discussed with the patient and how to seek care for testing (follow up with PCP or arrange E-visit).  The importance of social distancing was  discussed today.  Time:   Today, I have spent 15 minutes with the patient with telehealth technology discussing the above problems.     Medication Adjustments/Labs and Tests Ordered: Current medicines are reviewed at length with the patient today.  Concerns regarding medicines are outlined above.   Tests Ordered: No orders of the defined types were placed in this encounter.   Medication Changes: No orders of the defined types were placed in this encounter.   Disposition:  Follow up in 6 month(s)  Signed, [name], MD  04/16/2019 10:00 AM    [facility]

## 2019-04-23 ENCOUNTER — Ambulatory Visit (HOSPITAL_COMMUNITY): Payer: Medicare Other | Attending: Internal Medicine

## 2019-04-23 ENCOUNTER — Other Ambulatory Visit: Payer: Self-pay

## 2019-04-23 DIAGNOSIS — Z01818 Encounter for other preprocedural examination: Secondary | ICD-10-CM | POA: Diagnosis present

## 2019-04-23 DIAGNOSIS — I2510 Atherosclerotic heart disease of native coronary artery without angina pectoris: Secondary | ICD-10-CM | POA: Diagnosis present

## 2019-04-23 LAB — MYOCARDIAL PERFUSION IMAGING
LV dias vol: 64 mL (ref 62–150)
LV sys vol: 23 mL
Peak HR: 95 {beats}/min
Rest HR: 79 {beats}/min
SDS: 1
SRS: 0
SSS: 1
TID: 1.05

## 2019-04-23 MED ORDER — TECHNETIUM TC 99M TETROFOSMIN IV KIT
33.0000 | PACK | Freq: Once | INTRAVENOUS | Status: AC | PRN
  Administered 2019-04-23: 33 via INTRAVENOUS
  Filled 2019-04-23: qty 33

## 2019-04-23 MED ORDER — REGADENOSON 0.4 MG/5ML IV SOLN
0.4000 mg | Freq: Once | INTRAVENOUS | Status: AC
  Administered 2019-04-23: 0.4 mg via INTRAVENOUS

## 2019-04-23 MED ORDER — TECHNETIUM TC 99M TETROFOSMIN IV KIT
7.8000 | PACK | Freq: Once | INTRAVENOUS | Status: AC | PRN
  Administered 2019-04-23: 7.8 via INTRAVENOUS
  Filled 2019-04-23: qty 8

## 2019-05-02 ENCOUNTER — Telehealth: Payer: Self-pay | Admitting: Cardiovascular Disease

## 2019-05-02 NOTE — Telephone Encounter (Signed)
New message    Pt is wondering if the visits he had in April were his yearly visit or if he will need to schedule another appt    Please call

## 2019-05-02 NOTE — Telephone Encounter (Signed)
Returned pts call.  He was advised that per Dr. Camillia Herter last office note, pt is to return in 6 months, so he would need to make an appt for October. Pt thanked me for the call back.

## 2019-05-09 ENCOUNTER — Other Ambulatory Visit: Payer: Self-pay | Admitting: *Deleted

## 2019-05-09 DIAGNOSIS — R918 Other nonspecific abnormal finding of lung field: Secondary | ICD-10-CM

## 2019-05-12 ENCOUNTER — Other Ambulatory Visit (HOSPITAL_COMMUNITY)
Admission: RE | Admit: 2019-05-12 | Discharge: 2019-05-12 | Disposition: A | Discharge status: Home / Self Care | Payer: Medicare Other | Source: Ambulatory Visit | Attending: Cardiothoracic Surgery | Admitting: Cardiothoracic Surgery

## 2019-05-12 DIAGNOSIS — Z1159 Encounter for screening for other viral diseases: Secondary | ICD-10-CM | POA: Diagnosis not present

## 2019-05-12 DIAGNOSIS — Z01812 Encounter for preprocedural laboratory examination: Secondary | ICD-10-CM | POA: Diagnosis present

## 2019-05-12 DIAGNOSIS — R918 Other nonspecific abnormal finding of lung field: Secondary | ICD-10-CM | POA: Insufficient documentation

## 2019-05-13 LAB — NOVEL CORONAVIRUS, NAA (HOSP ORDER, SEND-OUT TO REF LAB; TAT 18-24 HRS): SARS-CoV-2, NAA: NOT DETECTED

## 2019-05-14 ENCOUNTER — Other Ambulatory Visit: Payer: Self-pay

## 2019-05-14 ENCOUNTER — Ambulatory Visit (HOSPITAL_COMMUNITY)
Admission: RE | Admit: 2019-05-14 | Discharge: 2019-05-14 | Disposition: A | Discharge status: Home / Self Care | Payer: Medicare Other | Source: Ambulatory Visit | Attending: Cardiothoracic Surgery | Admitting: Cardiothoracic Surgery

## 2019-05-14 ENCOUNTER — Encounter (HOSPITAL_COMMUNITY): Payer: Self-pay

## 2019-05-14 DIAGNOSIS — R918 Other nonspecific abnormal finding of lung field: Secondary | ICD-10-CM | POA: Diagnosis present

## 2019-05-14 LAB — PULMONARY FUNCTION TEST
DL/VA % pred: 96 %
DL/VA: 3.97 ml/min/mmHg/L
DLCO unc % pred: 89 %
DLCO unc: 20.25 ml/min/mmHg
FEF 25-75 Post: 3.21 L/sec
FEF 25-75 Pre: 3.3 L/sec
FEF2575-%Change-Post: -2 %
FEF2575-%Pred-Post: 155 %
FEF2575-%Pred-Pre: 159 %
FEV1-%Change-Post: -1 %
FEV1-%Pred-Post: 108 %
FEV1-%Pred-Pre: 110 %
FEV1-Post: 2.97 L
FEV1-Pre: 3.01 L
FEV1FVC-%Change-Post: 1 %
FEV1FVC-%Pred-Pre: 112 %
FEV6-%Change-Post: -4 %
FEV6-%Pred-Post: 99 %
FEV6-%Pred-Pre: 104 %
FEV6-Post: 3.48 L
FEV6-Pre: 3.65 L
FEV6FVC-%Pred-Post: 106 %
FEV6FVC-%Pred-Pre: 106 %
FVC-%Change-Post: -2 %
FVC-%Pred-Post: 94 %
FVC-%Pred-Pre: 97 %
FVC-Post: 3.54 L
FVC-Pre: 3.65 L
Post FEV1/FVC ratio: 84 %
Post FEV6/FVC ratio: 100 %
Pre FEV1/FVC ratio: 82 %
Pre FEV6/FVC Ratio: 100 %
RV % pred: 88 %
RV: 1.99 L
TLC % pred: 89 %
TLC: 5.58 L

## 2019-05-14 MED ORDER — ALBUTEROL SULFATE (2.5 MG/3ML) 0.083% IN NEBU
2.5000 mg | INHALATION_SOLUTION | Freq: Once | RESPIRATORY_TRACT | Status: AC
  Administered 2019-05-14: 2.5 mg via RESPIRATORY_TRACT

## 2019-05-15 ENCOUNTER — Ambulatory Visit: Payer: Medicare Other | Admitting: Cardiothoracic Surgery

## 2019-05-15 ENCOUNTER — Encounter (HOSPITAL_COMMUNITY): Payer: Medicare Other

## 2019-05-15 ENCOUNTER — Other Ambulatory Visit: Payer: Self-pay | Admitting: *Deleted

## 2019-05-15 ENCOUNTER — Encounter: Payer: Self-pay | Admitting: *Deleted

## 2019-05-15 VITALS — BP 147/73 | HR 87 | Temp 99.1°F | Resp 20 | Ht 66.0 in | Wt 189.0 lb

## 2019-05-15 DIAGNOSIS — J984 Other disorders of lung: Secondary | ICD-10-CM

## 2019-05-15 DIAGNOSIS — R918 Other nonspecific abnormal finding of lung field: Secondary | ICD-10-CM | POA: Diagnosis not present

## 2019-05-15 NOTE — Patient Instructions (Signed)
Pulmonary Nodule A pulmonary nodule is a small, round growth of tissue in the lung. It is sometimes referred to as a "shadow" or "spot on the lung." Nodules range in size from less than 1/5 of an inch (4 mm) to a little bigger than an inch (30 mm). Pulmonary nodules can be either noncancerous (benign) or cancerous (malignant). Most are noncancerous. Smaller nodules in people who do not smoke and do not have any other risk factors for lung cancer are more likely to be noncancerous. Larger, irregular nodules in people who smoke or who have a strong family history of lung cancer are more likely to be cancerous. What are the causes? This condition may be caused by:  A bacterial, fungal, or viral infection, such as tuberculosis. The infection is usually an old and inactive one.  A noncancerous mass of tissue.  Inflammation from conditions such as rheumatoid arthritis.  Abnormal blood vessels in the lungs.  Cancerous tissue, such as lung cancer or a cancer in another part of the body that has spread to the lung. What are the signs or symptoms? This condition usually does not cause symptoms. If symptoms appear, they are usually related to the underlying cause. For example, if the condition is caused by an infection, you may have a cough or fever. How is this diagnosed? This condition is usually diagnosed with an X-ray or CT scan. To help determine whether a pulmonary nodule is benign or malignant, your health care provider will:  Take your medical history.  Perform a physical exam.  Order tests, including: ? Blood tests. ? A skin test called a tuberculin test. This test is done to check if you have been exposed to the germ that causes tuberculosis. ? Chest X-rays. ? A CT scan. This test shows smaller pulmonary nodules more clearly and with more detail than an X-ray. ? A positron emission tomography (PET) scan. This test is done to check if the nodule is cancerous. During the test, a safe amount  of a radioactive substance is injected into the bloodstream. Then a picture is taken. ? Biopsy. In this test, a tiny piece of the pulmonary nodule is removed and then examined under a microscope. How is this treated? Treatment for this condition depends on whether the pulmonary nodule is malignant or benign as well as your risk of getting cancer.  Noncancerous nodules usually do not need to be treated, but they may need to be monitored with CT scans. If a CT scan shows that the pulmonary nodule got bigger, more tests may be done.  Some nodules need to be removed. If this is the case, you may have a procedure called a thoractomy. During the procedure, your health care provider will make an incision in your chest and remove the part of the lung where the nodule is located. Follow these instructions at home:   Take over-the-counter and prescription medicines only as told by your health care provider.  Do not use any products that contain nicotine or tobacco, such as cigarettes and e-cigarettes. If you need help quitting, ask your health care provider.  Keep all follow-up visits as told by your health care provider. This is important. Contact a health care provider if:  You have trouble breathing when you are active.  You feel sick or unusually tired.  You do not feel like eating.  You lose weight without trying.  You develop chills or night sweats. Get help right away if:  You cannot catch your breath.  You begin wheezing.  You cannot stop coughing.  You cough up blood.  You become dizzy or feel like you are going to faint.  You have sudden chest pain.  You have a fever or persistent symptoms for more than 2-3 days.  You have a fever and your symptoms suddenly get worse. Summary  A pulmonary nodule is a small, round growth of tissue in the lung. Most pulmonary nodules are noncancerous.  This condition is usually diagnosed with an X-ray or CT scan.  Common causes of  pulmonary nodules include infection, inflammation, and noncancerous growths.  Though less common, if a nodule is found to be cancerous, you will need specific diagnostic tests and treatment options as directed by your medical provider.  Treatment for this condition depends on whether the pulmonary nodule is benign or malignant as well as your risk of getting cancer. This information is not intended to replace advice given to you by your health care provider. Make sure you discuss any questions you have with your health care provider. Document Released: 10/08/2009 Document Revised: 01/09/2017 Document Reviewed: 01/09/2017 Elsevier Interactive Patient Education  2019 [place] torcica asistida por video Video-Assisted Thoracic Surgery La ciruga torcica asistida por video (CTAV) es un procedimiento que le permite al mdico ver el interior del trax y Optometrist procedimientos menores. La zona torcica es la que se encuentra entre el cuello y [place]. La CTAV puede indicarse para:  Interior and spatial designer.  Extraer Truddie Coco de tejido para examinarla (biopsia).  Colocarle medicamentos en el pecho.  Extraer lquido, pus o sangre.  Extraer bultos (tumores).  Extirpar parte de un pulmn (lobectoma).  Tratar algunos problemas en la columna vertebral. La CTAV se realiza mediante la colocacin de un tubo delgado (toracoscopio) en el pecho. El tubo tiene [place] luz y Ardelia Mems cmara en el extremo. El tubo enva imgenes a una pantalla de televisin. Esto le permite al mdico observar el interior del pecho. Qu ocurre antes del procedimiento? Medicamentos  Consulte al mdico si debe hacer o no lo siguiente: ? Cambiar o suspender los medicamentos que toma normalmente. Esto es importante si toma medicamentos para la diabetes o anticoagulantes. ? Tomar medicamentos como aspirina e ibuprofeno. Estos medicamentos pueden tener un efecto anticoagulante en la [place]. No  tome estos medicamentos antes del procedimiento si el mdico le indica que no lo haga.  Pueden administrarle antibiticos. Mantenerse hidratado Siga las indicaciones del mdico respecto de la hidratacin. Las indicaciones pueden incluir lo siguiente:  Hasta 2horas antes del procedimiento, puede seguir bebiendo lquidos claros. Estos incluyen agua, jugo de fruta transparente, caf solo y t solo. Restricciones en las comidas y bebidas [place] comidas y las bebidas. Las indicaciones pueden incluir lo siguiente:  Ocho horas antes del procedimiento, deje de ingerir comidas o alimentos pesados. Estos incluyen carne, alimentos fritos o alimentos grasos.  Seis horas antes del procedimiento, deje de ingerir comidas o alimentos livianos. Esto incluye tostadas o cereales.  Seis horas antes del procedimiento, deje de beber [place] o bebidas que [employer].  Dos horas antes del procedimiento, deje de beber lquidos transparentes. Instrucciones generales  Pregunte cmo se marcar o se identificar el lugar de la ciruga.  Es posible que le indiquen que se bae con un jabn antisptico.  Pueden extraerle Truddie Coco de [place] o pedirle una muestra de pis [place]).  Pueden hacerle estudios, por ejemplo: ? Radiografa de trax. ? Electrocardiograma (ECG). ?  Ecografa. ? Exploracin por tomografa computarizada (TC).  No consuma ningn producto que contenga nicotina o tabaco. Esto incluye cigarrillos y cigarrillos electrnicos. Si necesita ayuda para dejar de fumar, consulte al mdico.  Pdale a alguien que lo lleve a su casa.  Si se ir a su casa inmediatamente despus del procedimiento, planifique que alguien se quede con usted durante 24horas. Qu ocurre durante el procedimiento?  Para reducir el riesgo de infecciones: ? El equipo mdico se lavar o se desinfectar las manos. ? Le lavarn la piel con jabn.  Le colocarn un tubo (catter)  intravenoso en una de las venas.  Le administrarn lo siguiente: ? Un medicamento que lo har dormir (anestesia general). ? Un medicamento para ayudarlo a relajarse (sedante). ? Un medicamento que se inyecta en una zona del cuerpo para adormecer toda la regin que se encuentra por debajo del lugar de la inyeccin (anestesia regional).  Le colocarn un tubo delgado (catter) en la vejiga y en el tubo que transporta el pis (la orina) hacia el exterior del cuerpo. El catter drenar el pis.  Le harn de 1a4cortes pequeos en el pecho.  Le colocarn un tubo con Carlota Raspberry en uno de los cortes. Esto ayuda al cirujano a observar el interior del pecho.  Le quitarn el aire de uno de los pulmones (se desinflar). Esto ayuda al cirujano a observar la zona.  Le realizarn los procedimientos que sean necesarios.  Le volvern a Museum/gallery conservator pulmn (se inflar).  Pueden colocarle un tubo pleural a travs de uno de los cortes. Esto drena el lquido.  Los otros cortes se cerrarn con puntos (suturas) o grapas.  Pueden colocarle una venda (vendaje) sobre los cortes. El procedimiento puede variar. Qu ocurre despus del procedimiento?  Lo controlarn hasta que el efecto de los medicamentos haya desaparecido.  Puede tener un tubo pleural colocado. Se controlar que no tenga signos de acumulacin de lquido o aire.  Puede recibir lquidos y [employer] a travs de un tubo (catter) intravenoso.  Es posible que deba usar medias para prevenir la formacin de cogulos de sangre y reducir la hinchazn (medias de compresin).  No conduzca por 24horas si le dieron un medicamento para ayudarlo a que se relaje. Resumen  La ciruga torcica asistida por video (CTAV) es un procedimiento que le permite al mdico ver el interior del trax y Optometrist procedimientos menores.  La CTAV se realiza para estudiar o encontrar problemas en el pecho.  Despus del procedimiento, pueden colocarle un tubo  para drenar el lquido del pecho. Esta informacin no tiene Marine scientist el consejo del mdico. Asegrese de hacerle al mdico cualquier pregunta que tenga. Document Released: 08/13/2013 Document Revised: 09/06/2017 Document Reviewed: 09/06/2017 Elsevier Interactive Patient Education  2019 [place] Thoracic Surgery, Care After This sheet gives you information about how to care for yourself after your procedure. Your health care provider may also give you more specific instructions. If you have problems or questions, contact your health care provider. What can I expect after the procedure? After the procedure, it is common to have: Some pain and soreness in your chest. Pain when breathing in (inhaling) and coughing. Constipation. Fatigue. Difficulty sleeping. Follow these instructions at home: Preventing pneumonia Take deep breaths or do breathing exercises as instructed by your health care provider. Doing this helps prevent lung infection (pneumonia). Cough frequently. Coughing may cause discomfort, but it is important to clear mucus (phlegm) and expand your lungs. If it hurts  to cough, hold a pillow against your chest or place the palms of both hands on top of the incision (use splinting) when you cough. This may help relieve discomfort. If you were given an incentive spirometer, use it as directed. An incentive spirometer is a tool that measures how well you are filling your lungs with each breath. Participate in pulmonary rehabilitation as directed by your health care provider. This is a program that combines education, exercise, and support from a team of specialists. The goal is to help you heal and get back to your normal activities as soon as possible. Medicines Take over-the-counter or prescription medicines only as told by your health care provider. If you have pain, take pain-relieving medicine before your pain becomes severe. This is important because if  your pain is under control, you will be able to breathe and cough more comfortably. If you were prescribed an antibiotic medicine, take it as told by your health care provider. Do not stop taking the antibiotic even if you start to feel better. Activity Ask your health care provider what activities are safe for you. Avoid activities that use your chest muscles for at least 3-4 weeks. Do not lift anything that is heavier than 10 lb (4.5 kg), or the limit that your health care provider tells you, until he or she says that it is safe. Incision care Follow instructions from your health care provider about how to take care of your incision(s). Make sure you: Wash your hands with soap and water before you change your bandage (dressing). If soap and water are not available, use hand sanitizer. Change your dressing as told by your health care provider. Leave stitches (sutures), skin glue, or adhesive strips in place. These skin closures may need to stay in place for 2 weeks or longer. If adhesive strip edges start to loosen and curl up, you may trim the loose edges. Do not remove adhesive strips completely unless your health care provider tells you to do that. Keep your dressing dry until it has been removed. Check your incision area every day for signs of infection. Check for: Redness, swelling, or pain. Fluid or blood. Warmth. Pus or a bad smell. Bathing Do not take baths, swim, or use a hot tub until your health care provider approves. You may take showers. After your dressing has been removed, use soap and water to gently wash your incision area. Do not use anything else to clean your incision(s) unless your health care provider tells you to do this. Driving  Do not drive until your health care provider approves. Do not drive or use heavy machinery while taking prescription pain medicine. Eating and drinking Eat a healthy, balanced diet as instructed by your health care provider. A healthy diet  includes plenty of fresh fruits and vegetables, whole grains, and low-fat (lean) proteins. Limit foods that are high in fat and processed sugars, such as fried and sweet foods. Drink enough fluid to keep your urine clear or pale yellow. General instructions  To prevent or treat constipation while you are taking prescription pain medicine, your health care provider may recommend that you: Take over-the-counter or prescription medicines. Eat foods that are high in fiber, such as beans, fresh fruits and vegetables, and whole grains. Do not use any products that contain nicotine or tobacco, such as cigarettes and e-cigarettes. If you need help quitting, ask your health care provider. Avoid secondhand smoke. Wear compression stockings as told by your health care provider. These stockings  help to prevent blood clots and reduce swelling in your legs. If you have a chest tube, care for it as instructed by your health care provider. Do not travel by airplane during the 2 weeks after your chest tube is removed, or until your health care provider says that this is safe. Keep all follow-up visits as told by your health care provider. This is important. Contact a health care provider if: You have redness, swelling, or pain around an incision. You have fluid or blood coming from an incision. Your incision area feels warm to the touch. You have pus or a bad smell coming from an incision. You have a fever or chills. You have nausea or vomiting. You have pain that does not get better with medicine. Get help right away if: You have chest pain. Your heart is fluttering or beating rapidly. You develop a rash. You have shortness of breath or trouble breathing. You are confused. You have trouble speaking. You feel weak, light-headed, or dizzy. You faint. Summary To help prevent lung infection (pneumonia), take deep breaths or do breathing exercises as instructed by your health care provider. Cough  frequently to clear mucus (phlegm) and expand your lungs. If it hurts to cough, hold a pillow against your chest or place the palms of both hands on top of the incision (use splinting) when you cough. If you have pain, take pain-relieving medicine before your pain becomes severe. This is important because if your pain is under control, you will be able to breathe and cough more comfortably. Ask your health care provider what activities are safe for you. This information is not intended to replace advice given to you by your health care provider. Make sure you discuss any questions you have with your health care provider. Document Released: 04/07/2013 Document Revised: 11/20/2016 Document Reviewed: 11/20/2016 Elsevier Interactive Patient Education  2019 Elsevier Inc[employer]  Lung Resection  A lung resection is a procedure to remove part or all of a lung. An entire lung may be removed (pneumonectomy), or only part of it may be removed (lobectomy). A lung resection may be done as an open surgery or as a minimally invasive surgery. A lung resection is most often done to remove a tumor or cancer, but it may be done to treat other conditions. The procedure can relieve symptoms and keep the problem from getting worse. Tell a health care provider about:  Any allergies you have.  All medicines you are taking, including vitamins, herbs, eye drops, creams, and over-the-counter medicines.  Any problems you or family members have had with anesthetic medicines.  Any blood disorders you have.  Any surgeries you have had.  Any medical conditions you have.  Whether you are pregnant or may be pregnant. What are the risks? Generally, this is a safe procedure. However, problems may occur, including:  Excessive bleeding.  Infection.  Reaction to anesthesia.  Allergic reaction to medicines.  Blood clots.  Injury to a nerve or blood vessel.  Problems breathing.  Heart problems.  Stroke. What happens  before the procedure? Staying hydrated Follow instructions from your health care provider about hydration, which may include:  Up to 2 hours before the procedure - you may continue to drink clear liquids, such as water, clear fruit juice, black coffee, and plain tea. Eating and drinking restrictions Follow instructions from your health care provider about eating and drinking, which may include:  8 hours before the procedure - stop eating heavy meals or foods such as  meat, fried foods, or fatty foods.  6 hours before the procedure - stop eating light meals or foods, such as toast or cereal.  6 hours before the procedure - stop drinking milk or drinks that contain milk.  2 hours before the procedure - stop drinking clear liquids. Medicines Ask your health care provider about:  Changing or stopping your regular medicines. This is especially important if you are taking diabetes medicines or blood thinners.  Taking medicines such as aspirin and ibuprofen. These medicines can thin your blood. Do not take these medicines unless your health care provider tells you to take them.  Taking over-the-counter medicines, vitamins, herbs, and supplements. Tests You may have tests done before the procedure, including:  Blood and urine tests.  Imaging tests, such as X-rays, CT, MRI, or PET scans.  Bronchoscopy. In this procedure, a health care provider uses a flexible tube (bronchoscope) to look at the inside of your airways.  Pulmonary function tests. These are done to check how well your lungs work.  Heart testing. This is done to check heart function before the procedure.  Lymph node sampling. This may be done to see if you have a tumor that has spread. General instructions  Plan to have someone take you home from the hospital or clinic.  Plan to have a responsible adult care for you for at least 24 hours after you leave the hospital or clinic. This is important.  Do not use any products  that contain nicotine or tobacco for as long as possible before your procedure. These include cigarettes and e-cigarettes. If you need help quitting, ask your health care provider.  Ask your health care provider what steps will be taken to help prevent infection. These may include: ? Removing hair at the surgery site. ? Washing skin with a germ-killing soap. ? Taking antibiotic medicine. What happens during the procedure?  An IV will be inserted into one of your veins. You will be given one or both of the following: ? A medicine to help you relax (sedative). ? A medicine to make you fall asleep (general anesthetic).  A breathing tube will be placed in your throat.  A thin tube (catheter) may be inserted into the part of your body that drains urine from the bladder (urethra). The catheter will drain your urine.  Your health care provider will make a large incision on your side (open lung surgery) or several small incisions over your chest area (minimally invasive surgery).  Your health care provider will carefully cut any tissues leading to the area of the lung being treated.  The lung or part of the lung will then be removed. Lymph nodes near the lung may also be removed for testing.  Your health care provider will check inside your chest to make sure there is no bleeding in or around the lungs.  Your health care provider may put tubes into your chest to drain extra fluid and air after surgery.  Your incisions will be closed. This may be done using stitches (sutures), staples, skin glue, or skin tape (adhesive) strips.  A bandage (dressing) may be placed over your incisions. The procedure may vary among health care providers and hospitals. What happens after the procedure?  Your blood pressure, heart rate, breathing rate, and blood oxygen level will be monitored until you leave the hospital or clinic.  Right after surgery, you may: ? Be moved to the intensive care unit  (ICU). ? Continue to have a tube to  help you breathe or have a urinary catheter. ? Have an IV for fluids and medicines. ? Remain on a respirator, if assistance is needed to help you breathe. ? Start respiratory therapy in the ICU. This will help your other lung to get strong and stay healthy. ? Be given medicine to help with pain and nausea.  You may have to wear compression stockings. These stockings help to prevent blood clots and reduce swelling in your legs.  As you continue to recover: ? You will be moved to a regular hospital room. Therapy will continue. ? You may be released to go home or to an extended care facility. Summary  A lung resection is a procedure to remove part or all of a lung. It can be done as an open surgery or a minimally invasive surgery.  A lung resection is most often done to remove a tumor or cancer, but it may be done to treat other conditions.  After surgery, respiratory therapy will be prescribed to help your lung recover and become stronger. This information is not intended to replace advice given to you by your health care provider. Make sure you discuss any questions you have with your health care provider. Document Released: 03/03/2003 Document Revised: 12/24/2017 Document Reviewed: 12/24/2017 Elsevier Interactive Patient Education  2019 [employer].

## 2019-05-15 NOTE — Progress Notes (Signed)
[name]       ,[place] [address]             [phone]                    [place] Medical Record #[id number] Date of Birth: 09-13-1946  Referring: [name], MD Primary Care: [name], MD Primary Cardiologist: [name], MD  Chief Complaint:   Enlarging left upper lobe lung mass  History of Present Illness:  The patient gives a remote history of smoking but quit in  November 1985 and 2017 the patient had a CT scan of the chest in February May and June of that year initially was done for follow-up for his renal carcinoma.  He had no specific pulmonary symptoms at that time.  A left upper lobe lung lesion was present at that time and appeared fairly stable over the time.  A follow-up CT scan of the chest was done in 2018 and again in March 2020. The area in the left upper lobe appeared to enlarge, somewhat multiloculated associated with a cystic area.  Approximate 1.4 cm in size.  Follow-up PET scan was done and showed the area to be hypermetabolic.   Patient has a renal cell carcinoma s/p right nephrectomy-resected by Dr. [name] in January 2008  Patient has a history of CAD, HTN, HLD,Cardiac cath February 2006 with a 2.75 x 28 mm Costar study stent (DES) placed in the mid LAD. Last cath November 2006 with patent mid LAD stent, 40% proximal LAD stenosis, 80% small diagonal branch stenosis, minimal disease in the Circumflex and RCA. Echo 03/18/13 with normal LV function, mild LVH. Nuclear stress test May 2016 with no evidence of ischemia.   Laparoscopic partial hepatectomy 10/2011-benign focal hyperplasia no malignancy of the liver noted  Is relatively active with out angina and usual daily activities, he notes that if he was to run he would likely have shortness of breath and chest discomfort.  He had  PFTs done yesterday   Family history is significant for father who died of lung cancer in the 12s  PFT's done    FEV1 3.01   110 % DLCO 20.25  89% The FVC, FEV1, FEV1/FVC ratio and FEF25-75% are within normal limits. The airway resistance is normal. Lung volumes are within normal limits. Following administration of bronchodilators, there is no significant response. The diffusing capacity is normal. However, the diffusing capacity was not corrected for the patient's hemoglobin. Conclusions: The results are within normal limits. Pulmonary Function Diagnosis: Normal Pulmonary Function   Cardiac Stress test done 04/23/2019  Study Highlights     The left ventricular ejection fraction is normal (55-65%).  Nuclear stress EF: 64%.  No T wave inversion was noted during stress.  This is a low risk study.   Normal perfusion. LVEF 64% with normal wall motion. This is a low risk study.      Current Activity/ Functional Status:  Patient is independent with mobility/ambulation, transfers, ADL's, IADL's.   Zubrod Score: At the time of surgery this patients most appropriate activity status/level should be described as: []     0    Normal activity, no symptoms [x]     1    Restricted in physical strenuous activity but ambulatory, able to do out light work []     2    Ambulatory and capable of self care, unable to do work activities, up and about               >  50 % of waking hours                              []     3    Only limited self care, in bed greater than 50% of waking hours []     4    Completely disabled, no self care, confined to bed or chair []     5    Moribund   Past Medical History:  Diagnosis Date   Anxiety    Coronary artery disease    Eczema    GERD (gastroesophageal reflux disease)    Hearing loss    Hx of cardiovascular stress test    Lexiscan Myoview 5/16:  The study is normal. No ischemia identified. This is a low risk study. Overall left ventricular systolic function was normal. LV cavity size is normal. The left ventricular ejection fraction is normal (55-65%).  Sensitivity reduced by bowel loop attenuation artifact.   Hyperlipidemia    Hypertension    IBS (irritable bowel syndrome)    Psoriasis    PVD (peripheral vascular disease) ([place])    Rash    Renal cell cancer ([place])    Wears glasses    Wears hearing aid    both ears    Past Surgical History:  Procedure Laterality Date   CHOLECYSTECTOMY  10/26/11   COLONOSCOPY     CORONARY STENT PLACEMENT  2006   FINGER SURGERY  73 y/o   cut index rt   LIPOMA EXCISION Left 11/18/2013   Procedure: EXCISION POSTERIOR NECK LIPOMA ;  Surgeon: Stark Klein, MD;  Location: [facility];  Service: General;  Laterality: Left;   LIVER BIOPSY  09/18/11   LIVER LOBECTOMY  10/26/11   NEPHRECTOMY  2008   right   TONSILLECTOMY      Family History  Problem Relation Age of Onset   Hypertension Mother    Heart failure Mother    Heart disease Mother    Peripheral vascular disease Father    Lung cancer Father    Cancer Father        lung   Hypertension Sister    Heart attack Maternal Uncle    Colon cancer Neg Hx      Social History   Tobacco Use  Smoking Status Former Smoker   Packs/day: 1.00   Years: 15.00   Pack years: 15.00   Types: Cigarettes   Last attempt to quit: 10/26/1983   Years since quitting: 35.5  Smokeless Tobacco Never Used    Social History   Substance and Sexual Activity  Alcohol Use No   Alcohol/week: 0.0 standard drinks     Allergies  Allergen Reactions   Meperidine Hcl Other (See Comments)    REACTION: severe headache   Demerol Other (See Comments)    Severe headache.   Pseudoephedrine Other (See Comments)    Hypertension    Sulfa Antibiotics Other (See Comments)    Happened when he was child. Does not remember reaction.    Current Outpatient Medications  Medication Sig Dispense Refill   acetaminophen (TYLENOL) 500 MG tablet Take 500 mg by mouth every 6 (six) hours as needed for mild pain, fever or headache.       ALPRAZolam (XANAX) 0.5 MG tablet Take 0.5 mg by mouth 2 (two) times daily as needed. For anxiety     amLODipine (NORVASC) 5 MG tablet Take 5 mg by mouth daily.  aspirin 81 MG tablet Take 81 mg by mouth daily.       atenolol (TENORMIN) 100 MG tablet Take 100 mg by mouth daily.      atorvastatin (LIPITOR) 10 MG tablet Take 10 mg by mouth daily.       chlordiazePOXIDE-Clidinium (LIBRAX PO) Take 25 mg by mouth daily.     clopidogrel (PLAVIX) 75 MG tablet TAKE 1 TABLET BY MOUTH EVERY DAY 90 tablet 3   diphenhydrAMINE (BENADRYL) 25 MG tablet Take 25 mg by mouth at bedtime.      docusate sodium (COLACE) 100 MG capsule Take 100 mg by mouth 2 (two) times daily as needed for mild constipation.      hydrochlorothiazide (HYDRODIURIL) 25 MG tablet Take 25 mg by mouth daily.     ibuprofen (ADVIL,MOTRIN) 200 MG tablet Take 200 mg by mouth every 8 (eight) hours as needed. For pain      levothyroxine (SYNTHROID) 125 MCG tablet Take 125 mcg by mouth as directed. 1 tablet Mon-Sat on Sundays pt takes 2 tablets daily  3   nitroGLYCERIN (NITROSTAT) 0.4 MG SL tablet Place 1 tablet (0.4 mg total) under the tongue every 5 (five) minutes as needed. For chest pain 25 tablet 6   omeprazole (PRILOSEC) 20 MG capsule Take 20 mg by mouth 2 (two) times daily.      OVER THE COUNTER MEDICATION Take 1 tablet by mouth every 6 (six) hours as needed. Cold Symptom Relief     OVER THE COUNTER MEDICATION Take 1 tablet by mouth at bedtime as needed. Coriciden HBP cough expectorant suppressant     Polyethylene Glycol 3350 (MIRALAX PO) Take 17 g by mouth daily as needed (constipation).      Psyllium (METAMUCIL PO) Take 1 Dose by mouth daily as needed (constipation).      simethicone (MYLICON) 124 MG chewable tablet Chew 125 mg by mouth every 6 (six) hours as needed for flatulence.     valACYclovir (VALTREX) 500 MG tablet Take 500 mg by mouth 2 (two) times daily as needed (fever blisters).   0   No current  facility-administered medications for this visit.     Pertinent items are noted in HPI.   Review of Systems:     Cardiac Review of Systems: [Y] = yes  or   [ N ] = no   Chest Pain [ N  ]  Resting SOB [  N] Exertional SOB  [ Y]  Orthopnea N  ]   Pedal Edema Aqua.Slicker ]    Palpitations [ N] Syncope  [ N]   Presyncope N   ]   General Review of Systems: [Y] = yes [  ]=no Constitional: recent weight change [  ];  Wt loss over the last 3 months [   ] anorexia [  ]; fatigue [  ]; nausea [  ]; night sweats [  ]; fever [  ]; or chills [  ];           Eye : blurred vision [  ]; diplopia [   ]; vision changes [  ];  Amaurosis fugax[  ]; Resp: cough [ y ];  wheezing[ y ];  hemoptysis[  ]; shortness of breath[  ]; paroxysmal nocturnal dyspnea[  ]; dyspnea on exertion[  ]; or orthopnea[  ];  GI:  gallstones[  ], vomiting[  ];  dysphagia[  ]; melena[  ];  hematochezia [  ]; heartburn[  ];   Hx of  Colonoscopy[  ];  GU: kidney stones [  ]; hematuria[  ];   dysuria [  ];  nocturia[  ];  history of     obstruction [  ]; urinary frequency [  ]             Skin: rash, swelling[  ];, hair loss[  ];  peripheral edema[  ];  or itching[  ]; Musculosketetal: myalgias[  ];  joint swelling[  ];  joint erythema[  ];  joint pain[  ];  back pain[  ];  Heme/Lymph: bruising[  ];  bleeding[  ];  anemia[  ];  Neuro: TIA[  ];  headaches[  ];  stroke[  ];  vertigo[  ];  seizures[  ];   paresthesias[  ];  difficulty walking[  ];  Psych:depression[  ]; anxiety[  ];  Endocrine: diabetes[  ];  thyroid dysfunction[  ];  Immunizations: Flu up to date [ Y ]; Pneumococcal up to date [ Y ];  Other:     PHYSICAL EXAMINATION: General appearance: alert, cooperative and no distress Head: Normocephalic, without obvious abnormality, atraumatic Neck: no adenopathy, no carotid bruit, no JVD, supple, symmetrical, trachea midline and thyroid not enlarged, symmetric, no tenderness/mass/nodules Lymph nodes: Cervical, supraclavicular, and axillary  nodes normal. Resp: clear to auscultation bilaterally Back: symmetric, no curvature. ROM normal. No CVA tenderness. Cardio: regular rate and rhythm, S1, S2 normal, no murmur, click, rub or gallop GI: soft, non-tender; bowel sounds normal; no masses,  no organomegaly Extremities: extremities normal, atraumatic, no cyanosis or edema and Homans sign is negative, no sign of DVT Neurologic: Grossly normal   Diagnostic Studies & Laboratory data:     Recent Radiology Findings:   Ct Soft Tissue Neck W Contrast  Result Date: 03/05/2019 CLINICAL DATA:  Question parotid mass. History of lipoma resection in the neck. History of tonsillectomy. EXAM: CT NECK WITH CONTRAST TECHNIQUE: Multidetector CT imaging of the neck was performed using the standard protocol following the bolus administration of intravenous contrast. CONTRAST:  158mL ISOVUE-300 IOPAMIDOL (ISOVUE-300) INJECTION 61% COMPARISON:  None. FINDINGS: Pharynx and larynx: No mucosal or submucosal lesion. Salivary glands: Both parotid glands show fatty change and prominence. This can be seen with Sjogren syndrome. There are intraparotid lymph nodes slightly prominent similar to the other nodes in the neck described below. I do not see a parotid finding that I thinks specifically represents a parotid neoplasm. Any anyone these intraparotid densities could possibly represent at a parotid mass, but they are all well defined and if so, would likely represent Warthin's tumor. Submandibular glands are normal. Thyroid: Diminutive thyroid tissue.  No sign of thyroid mass. Lymph nodes: Slight prominence of the lymph nodes throughout all nodal stations of the neck in a bilaterally symmetric fashion. However, none measures outside of the range of normal. Level 2 lymph node on the right shows largest short axis dimension of 11 mm, within normal limits. Largest nodes at the other nodal stations shows short axis dimensions of 9 mm, within normal limits. No low-density  nodes. Vascular: Ordinary atherosclerotic change of the carotid bifurcations. Limited intracranial: Normal Visualized orbits: Limited.  Normal. Mastoids and visualized paranasal sinuses: Mucosal thickening of the maxillary sinus floors. No layering fluid. No fluid in the mastoids. Skeleton: Ordinary cervical spondylosis. Upper chest: See results of chest CT. Other: Small sebaceous cyst of the posterior neck midline. IMPRESSION: The parotid glands show enlargement and fatty change. This can be seen with Sjogren syndrome. Multiple bilateral intraparotid nodules most consistent with parotid lymph  nodes. See above discussion. Not possible to exclude a parotid mass such as Warthin's tumor, but lymph nodes are favored. In general, the lymph nodes of the neck are all at the upper limits of normal in size, but are asymmetric or exceed the range of normal. Electronically Signed   By: Nelson Chimes M.D.   On: 03/05/2019 16:04   Ct Chest W Contrast  Result Date: 03/05/2019 CLINICAL DATA:  Follow up left upper lobe pulmonary nodule. History of right nephrectomy in 2008 for renal cell carcinoma. EXAM: CT CHEST WITH CONTRAST TECHNIQUE: Multidetector CT imaging of the chest was performed during intravenous contrast administration. CONTRAST:  120mL ISOVUE-300 IOPAMIDOL (ISOVUE-300) INJECTION 61% COMPARISON:  Prior chest CTs ranging from 05/18/2016 through 02/19/2017. FINDINGS: Cardiovascular: Atherosclerosis of the aorta, great vessels and coronary arteries with a possible coronary artery stent. No acute vascular findings. The heart size is normal. There is no pericardial effusion. Mediastinum/Nodes: Again demonstrated are multiple prominent lower cervical, axillary, mediastinal and hilar lymph nodes bilaterally, similar to the previous studies. Representative nodes include a 12 mm AP window node on image 85/5 and a 17 mm right hilar node on image 97/5. The thyroid gland, trachea and esophagus demonstrate no significant  findings. Lungs/Pleura: There is no pleural effusion. There is mild centrilobular emphysema and cylindrical bronchiectasis. There has been significant progression in the soft tissue components of the previously described left upper lobe lesion. The soft tissue components now measure up to 2.0 x 1.4 cm on image 95 and have irregular margins, highly worrisome for bronchogenic carcinoma. The central cavitary component of this nodule is unchanged. Mild right upper lobe nodularity is stable. Upper abdomen: There is diffuse hepatic steatosis without focal hepatic abnormality. Complex cystic lesion medially in the spleen has not significantly changed. There is no evidence of mass in the right nephrectomy bed. Musculoskeletal/Chest wall: There is no chest wall mass or suspicious osseous finding. IMPRESSION: 1. Significant progression in the soft tissue components associated with the left upper lobe nodule, highly suspicious for bronchogenic carcinoma. Referral to multi disciplinary thoracic Oncology Clinic recommended. PET-CT may be helpful for further staging. 2. No other new or enlarging pulmonary nodules. 3. Chronic thoracic adenopathy attributed to a chronic benign process. 4. Stable findings in the upper abdomen, including hepatic steatosis and a complex cystic splenic lesion. 5. These results will be called to the ordering clinician or representative by the Radiologist Assistant, and communication documented in the PACS or zVision Dashboard. Electronically Signed   By: Richardean Sale M.D.   On: 03/05/2019 17:27   Left lung lesion present in 2017 :   Now:   Nm Pet Image Initial (pi) Skull Base To Thigh  Result Date: 03/14/2019 CLINICAL DATA:  Initial treatment strategy for left upper lobe pulmonary nodule. History of right nephrectomy for renal cell carcinoma. EXAM: NUCLEAR MEDICINE PET SKULL BASE TO THIGH TECHNIQUE: 9.9 mCi F-18 FDG was injected intravenously. Full-ring PET imaging was performed from the  skull base to thigh after the radiotracer. CT data was obtained and used for attenuation correction and anatomic localization. Fasting blood glucose: 96 mg/dl COMPARISON:  CT scan 03/05/2019 FINDINGS: Mediastinal blood pool activity: SUV max 2.62 NECK: Stable numerous scattered neck nodes including intraparotid nodes. Low level hypermetabolism noted in some of these nodes ranging between 1.5 and 2.6 SUV max. 9 mm right level 2 lymph node on image number 31 has an SUV max of 2.59. Incidental CT findings: none CHEST: Numerous small supraclavicular and borderline enlarged axillary lymph  nodes are noted. These appear relatively stable when compared to the prior chest CT from 2018. 13 mm right axillary node on image number 61 has an SUV max of 2.20. 12 mm left axillary lymph node on image number 65 has an SUV max of 2.35. 12 mm lateral left axillary lymph node on image number 57 has an SUV max of 2.39. Moderate bilateral symmetric gynecomastia is noted. 10 mm prevascular lymph node on image number 70 has an SUV max of 2.35. No other enlarged or hypermetabolic mediastinal or hilar lymph nodes are identified. The 23 mm semi solid left upper lobe lung lesion is hypermetabolic with SUV max of 6.38. This is suspicious for primary adenocarcinoma of the lung. Stable semi solid nodule in the right upper lobe on image number 22 measuring 8 mm. No hypermetabolism. Incidental CT findings: Three-vessel coronary artery calcifications. ABDOMEN/PELVIS: No abnormal hypermetabolic activity within the liver, pancreas, adrenal glands, or spleen. Scattered small retroperitoneal and pelvic lymph nodes but no hypermetabolism. No inguinal adenopathy. Small scattered inguinal lymph nodes weakly hypermetabolic. The right kidney is surgically absent. No worrisome findings in the nephrectomy bed. Incidental CT findings: Stable splenic lesion consistent with a benign cyst. There is also diffuse fatty infiltration of the liver and evidence of prior  right hepatic lobe surgery and a cholecystectomy. SKELETON: No significant findings. Incidental CT findings: none IMPRESSION: 1. Left upper lobe lung lesion is hypermetabolic and consistent with primary lung neoplasm, likely adenocarcinoma. 2. Numerous borderline bilateral neck, supraclavicular and axillary lymph nodes. Low level hypermetabolism suggesting chronic inflammatory change. 3. No enlarged or hypermetabolic mediastinal or hilar lymph nodes to suggest metastatic disease. 4. Stable 8 mm semi solid right upper lobe pulmonary nodule. Recommend continued observation/surveillance. Electronically Signed   By: Marijo Sanes M.D.   On: 03/14/2019 12:22     I have independently reviewed the above radiology studies  and reviewed the findings with the patient.   Recent Lab Findings: Lab Results  Component Value Date   WBC 6.4 02/06/2016   HGB 15.4 02/06/2016   HCT 46.2 02/06/2016   PLT 125 (L) 10/27/2011   GLUCOSE 78 02/06/2016   ALT 68 (H) 02/06/2016   AST 58 (H) 02/06/2016   NA 139 02/06/2016   K 4.1 02/06/2016   CL 104 02/06/2016   CREATININE 1.30 (H) 02/06/2016   BUN 17 02/06/2016   CO2 24 02/06/2016   INR 0.92 10/24/2011    Assessment / Plan:   #1 slowly enlarging left upper lobe lesion present on CT scan in 4536 now hypermetabolic 23 mm in size, although it has changed over several years is highly suspicious for adenocarcinoma of the lung. -Treatment options could be surgical resection versus stereotactic radiotherapy.  Currently there is no tissue diagnosis.  The patient's had pulmonary function studies done.  Patient has reasonable pulmonary function recent negative stress test and no cardiac symptoms.  He is reasonable candidate for surgical resection versus stereotactic radiotherapy.  I discussed the treatment options with him including stereotactic radiotherapy versus surgical resection primarily.  He prefers to proceed with primary surgical resection, but would like to wait  approximately 3 more weeks so his wife has opportunity to recover from colon surgery.  We tentatively plan bronchoscopy left video-assisted thoracoscopy lung resection on June 22.  The procedure is done described to him in detail including potential complications expected length of stay, presence of drainage tubes in anticipation post hospital care.  Patient has had his questions answered and is willing to  proceed    #2 known coronary artery disease-currently on Plavix- Seen by cardiology in anticipation of lung resection  -patient is on Plavix and is aware that we will need to stop this 5 days prior to surgery.  # 3 History of Renal  Cell CA-no evidence of recurrence  Grace Isaac MD      [address] [place], [address] Office [phone]   Murlean Hark [phone]  05/15/2019 8:50 AM   Addendum to the note:        I contacted AVYUKTH BONTEMPO remotely due to the limitations of the current COVID pandemic on 05/15/2019  at  8:50 AM  verifying that I was speaking to Elige Radon whose birthday is [birth year].   I discussed limitations of the evaluation and management  of patients remotely without  the benefit of physical exam.  The patient was agreeable with proceeding with a remote/ not face to face visit.   After the multidisciplinary thoracic oncology conference this morning, I contacted the patient again and had a remote conference with him filling him in on the details discussed at the conference.  The patient could be a candidate for stereotactic radiotherapy, but surgical resection would be preferable including node sampling.  Radiology was somewhat concerned about CT directed needle biopsy of the lesion because of the associated cyst.   We we will contact cardiology office for preoperative clearance-known patient of Dr. Angelena Form  See the patient back with pulmonary function studies in the office in mid to late May, when PFTs can be performed and tentatively plan for  surgery in early June  The patient was agreeable with this approach.

## 2019-06-11 NOTE — Pre-Procedure Instructions (Signed)
[id number] [address], [place] [address] [place] [address] Phone: [phone] Fax: [phone]      Your procedure is scheduled on Monday June 22nd.  Report to [facility] Main Entrance "A" at 5:30 A.M., and check in at the Admitting office.  Call this number if you have problems the morning of surgery:  [phone]  Call [phone] if you have any questions prior to your surgery date Monday-Friday 8am-4pm    Remember:  Do not eat or drink after midnight.    Take these medicines the morning of surgery with A SIP OF WATER amLODipine (NORVASC)  atenolol (TENORMIN) chlordiazePOXIDE-Clidinium (LIBRAX PO) levothyroxine (SYNTHROID) omeprazole (PRILOSEC) acetaminophen (TYLENOL) if needed ALPRAZolam Duanne Moron) if needed nitroGLYCERIN (NITROSTAT) if needed valACYclovir (VALTREX) if needed  Follow your surgeon's instructions on when to stop Asprin and Plavix.  If no instructions were given by your surgeon then you will need to call the office to get those instructions.     As of today,STOP taking any Aspirin (unless otherwise instructed by your surgeon), Aleve, Naproxen, Ibuprofen, Motrin, Advil, Goody's, BC's, all herbal medications, fish oil, and all vitamins.    The Morning of Surgery  Do not wear jewelry, make-up or nail polish.  Do not wear lotions, powders, or perfumes/colognes, or deodorant  Do not shave 48 hours prior to surgery.  Men may shave face and neck.  Do not bring valuables to the hospital.  [facility] is not responsible for any belongings or valuables.  If you are a smoker, DO NOT Smoke 24 hours prior to surgery IF you wear a CPAP at night please bring your mask, tubing, and machine the morning of surgery   Remember that you must have someone to transport you home after your surgery, and remain with you for 24 hours if you are discharged the same day.   Contacts, glasses, hearing aids, dentures or bridgework may not  be worn into surgery.    Leave your suitcase in the car.  After surgery it may be brought to your room.  For patients admitted to the hospital, discharge time will be determined by your treatment team.  Patients discharged the day of surgery will not be allowed to drive home.    Special instructions:   [facility]- Preparing For Surgery  Before surgery, you can play an important role. Because skin is not sterile, your skin needs to be as free of germs as possible. You can reduce the number of germs on your skin by washing with CHG (chlorahexidine gluconate) Soap before surgery.  CHG is an antiseptic cleaner which kills germs and bonds with the skin to continue killing germs even after washing.    Oral Hygiene is also important to reduce your risk of infection.  Remember - BRUSH YOUR TEETH THE MORNING OF SURGERY WITH YOUR REGULAR TOOTHPASTE  Please do not use if you have an allergy to CHG or antibacterial soaps. If your skin becomes reddened/irritated stop using the CHG.  Do not shave (including legs and underarms) for at least 48 hours prior to first CHG shower. It is OK to shave your face.  Please follow these instructions carefully.   1. Shower the NIGHT BEFORE SURGERY and the MORNING OF SURGERY with CHG Soap.   2. If you chose to wash your hair, wash your hair first as usual with your normal shampoo.  3. After you shampoo, rinse your hair and body thoroughly to remove the shampoo.  4. Use CHG  as you would any other liquid soap. You can apply CHG directly to the skin and wash gently with a scrungie or a clean washcloth.   5. Apply the CHG Soap to your body ONLY FROM THE NECK DOWN.  Do not use on open wounds or open sores. Avoid contact with your eyes, ears, mouth and genitals (private parts). Wash Face and genitals (private parts)  with your normal soap.   6. Wash thoroughly, paying special attention to the area where your surgery will be performed.  7. Thoroughly rinse your body  with warm water from the neck down.  8. DO NOT shower/wash with your normal soap after using and rinsing off the CHG Soap.  9. Pat yourself dry with a CLEAN TOWEL.  10. Wear CLEAN PAJAMAS to bed the night before surgery, wear comfortable clothes the morning of surgery  11. Place CLEAN SHEETS on your bed the night of your first shower and DO NOT SLEEP WITH PETS.    Day of Surgery:  Do not apply any deodorants/lotions.  Please wear clean clothes to the hospital/surgery center.   Remember to brush your teeth WITH YOUR REGULAR TOOTHPASTE.   Please read over the following fact sheets that you were given.

## 2019-06-12 ENCOUNTER — Encounter (HOSPITAL_COMMUNITY): Payer: Self-pay

## 2019-06-12 ENCOUNTER — Other Ambulatory Visit (HOSPITAL_COMMUNITY)
Admission: RE | Admit: 2019-06-12 | Discharge: 2019-06-12 | Disposition: A | Discharge status: Home / Self Care | Payer: Medicare Other | Source: Ambulatory Visit | Attending: Cardiothoracic Surgery | Admitting: Cardiothoracic Surgery

## 2019-06-12 ENCOUNTER — Encounter (HOSPITAL_COMMUNITY)
Admission: RE | Admit: 2019-06-12 | Discharge: 2019-06-12 | Disposition: A | Discharge status: Home / Self Care | Payer: Medicare Other | Source: Ambulatory Visit | Attending: Cardiothoracic Surgery | Admitting: Cardiothoracic Surgery

## 2019-06-12 ENCOUNTER — Other Ambulatory Visit: Payer: Self-pay

## 2019-06-12 DIAGNOSIS — Z87891 Personal history of nicotine dependence: Secondary | ICD-10-CM | POA: Insufficient documentation

## 2019-06-12 DIAGNOSIS — J984 Other disorders of lung: Secondary | ICD-10-CM | POA: Diagnosis not present

## 2019-06-12 DIAGNOSIS — Z01818 Encounter for other preprocedural examination: Secondary | ICD-10-CM | POA: Insufficient documentation

## 2019-06-12 DIAGNOSIS — I2510 Atherosclerotic heart disease of native coronary artery without angina pectoris: Secondary | ICD-10-CM | POA: Diagnosis not present

## 2019-06-12 DIAGNOSIS — R9431 Abnormal electrocardiogram [ECG] [EKG]: Secondary | ICD-10-CM | POA: Insufficient documentation

## 2019-06-12 DIAGNOSIS — I10 Essential (primary) hypertension: Secondary | ICD-10-CM | POA: Diagnosis not present

## 2019-06-12 HISTORY — DX: Hypothyroidism, unspecified: E03.9

## 2019-06-12 LAB — BLOOD GAS, ARTERIAL
Acid-Base Excess: 0.9 mmol/L (ref 0.0–2.0)
Bicarbonate: 24.4 mmol/L (ref 20.0–28.0)
Drawn by: 421801
FIO2: 21
O2 Saturation: 97.1 %
Patient temperature: 98.6
pCO2 arterial: 35.2 mmHg (ref 32.0–48.0)
pH, Arterial: 7.456 — ABNORMAL HIGH (ref 7.350–7.450)
pO2, Arterial: 92.6 mmHg (ref 83.0–108.0)

## 2019-06-12 LAB — COMPREHENSIVE METABOLIC PANEL
ALT: 80 U/L — ABNORMAL HIGH (ref 0–44)
AST: 78 U/L — ABNORMAL HIGH (ref 15–41)
Albumin: 4.1 g/dL (ref 3.5–5.0)
Alkaline Phosphatase: 142 U/L — ABNORMAL HIGH (ref 38–126)
Anion gap: 14 (ref 5–15)
BUN: 18 mg/dL (ref 8–23)
CO2: 21 mmol/L — ABNORMAL LOW (ref 22–32)
Calcium: 9.6 mg/dL (ref 8.9–10.3)
Chloride: 102 mmol/L (ref 98–111)
Creatinine, Ser: 1.43 mg/dL — ABNORMAL HIGH (ref 0.61–1.24)
GFR calc Af Amer: 57 mL/min — ABNORMAL LOW (ref >60)
GFR calc non Af Amer: 49 mL/min — ABNORMAL LOW (ref >60)
Glucose, Bld: 123 mg/dL — ABNORMAL HIGH (ref 70–99)
Potassium: 3.7 mmol/L (ref 3.5–5.1)
Sodium: 137 mmol/L (ref 135–145)
Total Bilirubin: 1 mg/dL (ref 0.3–1.2)
Total Protein: 7.3 g/dL (ref 6.5–8.1)

## 2019-06-12 LAB — SURGICAL PCR SCREEN
MRSA, PCR: NEGATIVE
Staphylococcus aureus: NEGATIVE

## 2019-06-12 LAB — PROTIME-INR
INR: 1 (ref 0.8–1.2)
Prothrombin Time: 13.2 seconds (ref 11.4–15.2)

## 2019-06-12 LAB — URINALYSIS, ROUTINE W REFLEX MICROSCOPIC
Bilirubin Urine: NEGATIVE
Glucose, UA: NEGATIVE mg/dL
Hgb urine dipstick: NEGATIVE
Ketones, ur: NEGATIVE mg/dL
Leukocytes,Ua: NEGATIVE
Nitrite: NEGATIVE
Protein, ur: 100 mg/dL — AB
Specific Gravity, Urine: 1.018 (ref 1.005–1.030)
pH: 5 (ref 5.0–8.0)

## 2019-06-12 LAB — CBC
HCT: 44 % (ref 39.0–52.0)
Hemoglobin: 14.6 g/dL (ref 13.0–17.0)
MCH: 30 pg (ref 26.0–34.0)
MCHC: 33.2 g/dL (ref 30.0–36.0)
MCV: 90.5 fL (ref 80.0–100.0)
Platelets: 194 10*3/uL (ref 150–400)
RBC: 4.86 MIL/uL (ref 4.22–5.81)
RDW: 13.7 % (ref 11.5–15.5)
WBC: 15.3 10*3/uL — ABNORMAL HIGH (ref 4.0–10.5)
nRBC: 0.1 % (ref 0.0–0.2)

## 2019-06-12 LAB — SARS CORONAVIRUS 2 (TAT 6-24 HRS): SARS Coronavirus 2: NEGATIVE

## 2019-06-12 LAB — TYPE AND SCREEN
ABO/RH(D): O NEG
Antibody Screen: NEGATIVE

## 2019-06-12 LAB — APTT: aPTT: 26 seconds (ref 24–36)

## 2019-06-12 NOTE — Progress Notes (Signed)
PCP -[name]  Cardiologist - McAlhaney  Chest x-ray - will be done DOS EKG -today  Stress Test -  ECHO -  Cardiac Cath - 2006  Sleep Study - none CPAP -   Fasting Blood Sugar - n/a Checks Blood Sugar _____ times a day  Blood Thinner Instructions:plavix taken last on Tueday 06/10/2019 Aspirin Instructions: continued per Dr. Servando Snare   Anesthesia review: will send chart to anesth. Dept.    Patient denies shortness of breath, fever, cough and chest pain at PAT appointment   Patient verbalized understanding of instructions that were given to them at the PAT appointment. Patient was also instructed that they will need to review over the PAT instructions again at home before surgery.

## 2019-06-13 NOTE — Anesthesia Preprocedure Evaluation (Deleted)
Anesthesia Evaluation    Airway        Dental   Pulmonary former smoker,           Cardiovascular hypertension,      Neuro/Psych    GI/Hepatic   Endo/Other    Renal/GU      Musculoskeletal   Abdominal   Peds  Hematology   Anesthesia Other Findings   Reproductive/Obstetrics                             Anesthesia Physical Anesthesia Plan  ASA:   Anesthesia Plan:    Post-op Pain Management:    Induction:   PONV Risk Score and Plan:   Airway Management Planned:   Additional Equipment:   Intra-op Plan:   Post-operative Plan:   Informed Consent:   Plan Discussed with:   Anesthesia Plan Comments: (See PAT note by Karoline Caldwell, PA-C )        Anesthesia Quick Evaluation

## 2019-06-13 NOTE — Progress Notes (Signed)
Anesthesia Chart Review:  Case: [id number] Date/Time: 06/16/19 0715   Procedures:      VIDEO BRONCHOSCOPY (N/A )     VIDEO ASSISTED THORACOSCOPY (VATS)/LUNG RESECTION (Left Chest)   Anesthesia type: General   Pre-op diagnosis: LUL LUNG MASS   Location: [facility] OR ROOM 14 / [facility]   Surgeon: Grace Isaac, MD      DISCUSSION: 73 yo male former smoker. Pertinent hx includes GERD, CAD s/p stent to LAD 2006, HTN, HLD, hepatic steatosis s/p right hepatic lobectomy and cholecystectomy, renal cell carcinoma s/p right nephrectomy and new diagnosis of a lung mass.  Follows with Dr. Angelena Form for hx of CAD s/p stent to LAD 2006. Seen 04/16/19 for preop clearance. Per note, "He is very active achieving over 4 METS per day. No worrisome signs or symptoms of angina, CHF or arrhythmias. Given known underlying CAD, will plan an exercise nuclear stress test."  Subsequent stress test 04/23/19 was low risk, normal perfusion, EF 64%, normal wall motion.  Anticipate he can proceed as planned barring acute status change.    VS: BP (!) 162/68   Pulse 78   Temp 37.4 C   Resp 20   Ht 5\' 6"  (1.676 m)   Wt 85 kg   SpO2 100%   BMI 30.23 kg/m   PROVIDERS: [name], MD is PCP  [name], MD is Cardiologist  LABS: Labs reviewed: Acceptable for surgery. C/w renal insufficiency s/p right nephrectomy, Mildly elevated transaminases, history of similar.  (all labs ordered are listed, but only abnormal results are displayed)  Labs Reviewed  BLOOD GAS, ARTERIAL - Abnormal; Notable for the following components:      Result Value   pH, Arterial 7.456 (*)    All other components within normal limits  CBC - Abnormal; Notable for the following components:   WBC 15.3 (*)    All other components within normal limits  COMPREHENSIVE METABOLIC PANEL - Abnormal; Notable for the following components:   CO2 21 (*)    Glucose, Bld 123 (*)    Creatinine, Ser 1.43 (*)    AST 78 (*)    ALT 80 (*)    Alkaline  Phosphatase 142 (*)    GFR calc non Af Amer 49 (*)    GFR calc Af Amer 57 (*)    All other components within normal limits  URINALYSIS, ROUTINE W REFLEX MICROSCOPIC - Abnormal; Notable for the following components:   Protein, ur 100 (*)    Bacteria, UA RARE (*)    All other components within normal limits  SURGICAL PCR SCREEN  APTT  PROTIME-INR  TYPE AND SCREEN     IMAGES: CT Chest 03/05/19: IMPRESSION: 1. Significant progression in the soft tissue components associated with the left upper lobe nodule, highly suspicious for bronchogenic carcinoma. Referral to multi disciplinary thoracic Oncology Clinic recommended. PET-CT may be helpful for further staging. 2. No other new or enlarging pulmonary nodules. 3. Chronic thoracic adenopathy attributed to a chronic benign process. 4. Stable findings in the upper abdomen, including hepatic steatosis and a complex cystic splenic lesion. 5. These results will be called to the ordering clinician or representative by the Radiologist Assistant, and communication documented in the PACS or zVision Dashboard.  EKG: 06/12/19: Normal sinus rhythm. Rate 74. Nonspecific ST and T wave abnormality. Prolonged QT/QTc 420/466 ms  CV: Nuclear stress 04/23/19:  The left ventricular ejection fraction is normal (55-65%).  Nuclear stress EF: 64%.  No T wave inversion was noted  during stress.  This is a low risk study.   Normal perfusion. LVEF 64% with normal wall motion. This is a low risk study.  Echo 03/18/13: Left ventricle: The cavity size was normal. Wall thickness was increased in a pattern of mild LVH. There was mild concentric hypertrophy. Systolic function was normal. The estimated ejection fraction was in the range of 60% to 65%. Wall motion was normal; there were no regional wall motion abnormalities. Left ventricular diastolic function parameters were normal. - Aortic valve: Trivial regurgitation.    Past Medical History:   Diagnosis Date  . Anxiety   . Coronary artery disease   . Eczema   . GERD (gastroesophageal reflux disease)   . Hearing loss   . Hx of cardiovascular stress test    Lexiscan Myoview 5/16:  The study is normal. No ischemia identified. This is a low risk study. Overall left ventricular systolic function was normal. LV cavity size is normal. The left ventricular ejection fraction is normal (55-65%). Sensitivity reduced by bowel loop attenuation artifact.  . Hyperlipidemia   . Hypertension   . Hypothyroidism   . IBS (irritable bowel syndrome)   . Psoriasis   . PVD (peripheral vascular disease) ([facility])   . Rash   . Renal cell cancer ([place])   . Wears glasses   . Wears hearing aid    both ears    Past Surgical History:  Procedure Laterality Date  . CARDIAC CATHETERIZATION    . CHOLECYSTECTOMY  10/26/11  . COLONOSCOPY    . CORONARY STENT PLACEMENT  2006  . FINGER SURGERY  73 y/o   cut index rt  . LIPOMA EXCISION Left 11/18/2013   Procedure: EXCISION POSTERIOR NECK LIPOMA ;  Surgeon: Stark Klein, MD;  Location: [facility];  Service: General;  Laterality: Left;  . LIVER BIOPSY  09/18/11  . LIVER LOBECTOMY  10/26/11  . NEPHRECTOMY  2008   right  . TONSILLECTOMY      MEDICATIONS: . acetaminophen (TYLENOL) 500 MG tablet  . ALPRAZolam (XANAX) 0.5 MG tablet  . amLODipine (NORVASC) 5 MG tablet  . aspirin 81 MG tablet  . atenolol (TENORMIN) 100 MG tablet  . atorvastatin (LIPITOR) 10 MG tablet  . chlordiazePOXIDE-Clidinium (LIBRAX PO)  . clopidogrel (PLAVIX) 75 MG tablet  . diphenhydrAMINE (BENADRYL) 25 MG tablet  . hydrochlorothiazide (HYDRODIURIL) 25 MG tablet  . ibuprofen (ADVIL,MOTRIN) 200 MG tablet  . levothyroxine (SYNTHROID) 125 MCG tablet  . nitroGLYCERIN (NITROSTAT) 0.4 MG SL tablet  . omeprazole (PRILOSEC) 20 MG capsule  . simethicone (MYLICON) 175 MG chewable tablet  . valACYclovir (VALTREX) 500 MG tablet   No current facility-administered medications for  this encounter.    Wynonia Musty [facility] Short Stay Center/Anesthesiology Phone [phone] 06/13/2019 10:44 AM

## 2019-06-15 NOTE — Anesthesia Preprocedure Evaluation (Addendum)
Anesthesia Evaluation  Patient identified by MRN, date of birth, ID band Patient awake    Reviewed: Allergy & Precautions, NPO status , Patient's Chart, lab work & pertinent test results  History of Anesthesia Complications Negative for: history of anesthetic complications  Airway Mallampati: II  TM Distance: <3 FB Neck ROM: Full    Dental no notable dental hx. (+) Dental Advisory Given   Pulmonary former smoker,    Pulmonary exam normal        Cardiovascular hypertension, Pt. on home beta blockers and Pt. on medications + CAD and + Cardiac Stents  Normal cardiovascular exam  Follows with Dr. Angelena Form for hx of CAD s/p stent to LAD 2006. Seen 04/16/19 for preop clearance. Per note, "He is very active achieving over 4 METS per day. No worrisome signs or symptoms of angina, CHF or arrhythmias. Given known underlying CAD, will plan an exercise nuclear stress test."  Subsequent stress test 04/23/19 was low risk, normal perfusion, EF 64%, normal wall motion.  Anticipate he can proceed as planned barring acute status change.     Neuro/Psych PSYCHIATRIC DISORDERS Anxiety negative neurological ROS     GI/Hepatic Neg liver ROS, GERD  Medicated,  Endo/Other  Hypothyroidism   Renal/GU Renal InsufficiencyRenal diseaseRenal Ca     Musculoskeletal negative musculoskeletal ROS (+)   Abdominal   Peds  Hematology negative hematology ROS (+)   Anesthesia Other Findings Day of surgery medications reviewed with the patient.  Reproductive/Obstetrics                            Anesthesia Physical Anesthesia Plan  ASA: III  Anesthesia Plan: General   Post-op Pain Management:    Induction: Intravenous  PONV Risk Score and Plan: 3 and Ondansetron, Dexamethasone and Diphenhydramine  Airway Management Planned: Double Lumen EBT  Additional Equipment: CVP  Intra-op Plan:   Post-operative Plan:  Extubation in OR  Informed Consent: I have reviewed the patients History and Physical, chart, labs and discussed the procedure including the risks, benefits and alternatives for the proposed anesthesia with the patient or authorized representative who has indicated his/her understanding and acceptance.     Dental advisory given  Plan Discussed with: CRNA, Anesthesiologist and Surgeon  Anesthesia Plan Comments:        Anesthesia Quick Evaluation

## 2019-06-16 ENCOUNTER — Inpatient Hospital Stay (HOSPITAL_COMMUNITY): Payer: Medicare Other

## 2019-06-16 ENCOUNTER — Encounter (HOSPITAL_COMMUNITY)
Admission: RE | Disposition: A | Discharge status: Home / Self Care | Payer: Self-pay | Source: Home / Self Care | Attending: Cardiothoracic Surgery

## 2019-06-16 ENCOUNTER — Encounter (HOSPITAL_COMMUNITY): Payer: Self-pay | Admitting: *Deleted

## 2019-06-16 ENCOUNTER — Other Ambulatory Visit: Payer: Self-pay

## 2019-06-16 ENCOUNTER — Inpatient Hospital Stay (HOSPITAL_COMMUNITY)
Admission: RE | Admit: 2019-06-16 | Discharge: 2019-06-23 | DRG: 164 | Disposition: A | Discharge status: Home / Self Care | Payer: Medicare Other | Attending: Cardiothoracic Surgery | Admitting: Cardiothoracic Surgery

## 2019-06-16 ENCOUNTER — Inpatient Hospital Stay (HOSPITAL_COMMUNITY): Payer: Medicare Other | Admitting: Certified Registered"

## 2019-06-16 ENCOUNTER — Inpatient Hospital Stay (HOSPITAL_COMMUNITY): Payer: Medicare Other | Admitting: Physician Assistant

## 2019-06-16 DIAGNOSIS — Z8249 Family history of ischemic heart disease and other diseases of the circulatory system: Secondary | ICD-10-CM | POA: Diagnosis not present

## 2019-06-16 DIAGNOSIS — Z85528 Personal history of other malignant neoplasm of kidney: Secondary | ICD-10-CM

## 2019-06-16 DIAGNOSIS — F419 Anxiety disorder, unspecified: Secondary | ICD-10-CM | POA: Diagnosis present

## 2019-06-16 DIAGNOSIS — C3490 Malignant neoplasm of unspecified part of unspecified bronchus or lung: Secondary | ICD-10-CM | POA: Diagnosis present

## 2019-06-16 DIAGNOSIS — C3412 Malignant neoplasm of upper lobe, left bronchus or lung: Principal | ICD-10-CM | POA: Diagnosis present

## 2019-06-16 DIAGNOSIS — Z885 Allergy status to narcotic agent status: Secondary | ICD-10-CM | POA: Diagnosis not present

## 2019-06-16 DIAGNOSIS — E785 Hyperlipidemia, unspecified: Secondary | ICD-10-CM | POA: Diagnosis present

## 2019-06-16 DIAGNOSIS — Z955 Presence of coronary angioplasty implant and graft: Secondary | ICD-10-CM

## 2019-06-16 DIAGNOSIS — J9382 Other air leak: Secondary | ICD-10-CM | POA: Diagnosis not present

## 2019-06-16 DIAGNOSIS — I739 Peripheral vascular disease, unspecified: Secondary | ICD-10-CM | POA: Diagnosis present

## 2019-06-16 DIAGNOSIS — H9190 Unspecified hearing loss, unspecified ear: Secondary | ICD-10-CM | POA: Diagnosis present

## 2019-06-16 DIAGNOSIS — Z902 Acquired absence of lung [part of]: Secondary | ICD-10-CM

## 2019-06-16 DIAGNOSIS — Z801 Family history of malignant neoplasm of trachea, bronchus and lung: Secondary | ICD-10-CM

## 2019-06-16 DIAGNOSIS — Z9049 Acquired absence of other specified parts of digestive tract: Secondary | ICD-10-CM | POA: Diagnosis not present

## 2019-06-16 DIAGNOSIS — K589 Irritable bowel syndrome without diarrhea: Secondary | ICD-10-CM | POA: Diagnosis present

## 2019-06-16 DIAGNOSIS — I2510 Atherosclerotic heart disease of native coronary artery without angina pectoris: Secondary | ICD-10-CM | POA: Diagnosis present

## 2019-06-16 DIAGNOSIS — Z938 Other artificial opening status: Secondary | ICD-10-CM

## 2019-06-16 DIAGNOSIS — Z01818 Encounter for other preprocedural examination: Secondary | ICD-10-CM

## 2019-06-16 DIAGNOSIS — E039 Hypothyroidism, unspecified: Secondary | ICD-10-CM | POA: Diagnosis present

## 2019-06-16 DIAGNOSIS — Z23 Encounter for immunization: Secondary | ICD-10-CM | POA: Diagnosis present

## 2019-06-16 DIAGNOSIS — D62 Acute posthemorrhagic anemia: Secondary | ICD-10-CM | POA: Diagnosis not present

## 2019-06-16 DIAGNOSIS — Z974 Presence of external hearing-aid: Secondary | ICD-10-CM

## 2019-06-16 DIAGNOSIS — Z882 Allergy status to sulfonamides status: Secondary | ICD-10-CM

## 2019-06-16 DIAGNOSIS — K219 Gastro-esophageal reflux disease without esophagitis: Secondary | ICD-10-CM | POA: Diagnosis present

## 2019-06-16 DIAGNOSIS — I10 Essential (primary) hypertension: Secondary | ICD-10-CM | POA: Diagnosis present

## 2019-06-16 DIAGNOSIS — R1310 Dysphagia, unspecified: Secondary | ICD-10-CM | POA: Diagnosis not present

## 2019-06-16 DIAGNOSIS — Z87891 Personal history of nicotine dependence: Secondary | ICD-10-CM | POA: Diagnosis not present

## 2019-06-16 DIAGNOSIS — R918 Other nonspecific abnormal finding of lung field: Secondary | ICD-10-CM | POA: Diagnosis present

## 2019-06-16 DIAGNOSIS — Z7902 Long term (current) use of antithrombotics/antiplatelets: Secondary | ICD-10-CM

## 2019-06-16 DIAGNOSIS — J939 Pneumothorax, unspecified: Secondary | ICD-10-CM | POA: Diagnosis present

## 2019-06-16 DIAGNOSIS — R350 Frequency of micturition: Secondary | ICD-10-CM | POA: Diagnosis present

## 2019-06-16 DIAGNOSIS — Z9689 Presence of other specified functional implants: Secondary | ICD-10-CM

## 2019-06-16 DIAGNOSIS — J984 Other disorders of lung: Secondary | ICD-10-CM

## 2019-06-16 DIAGNOSIS — Z888 Allergy status to other drugs, medicaments and biological substances status: Secondary | ICD-10-CM

## 2019-06-16 DIAGNOSIS — R911 Solitary pulmonary nodule: Secondary | ICD-10-CM

## 2019-06-16 HISTORY — PX: VIDEO ASSISTED THORACOSCOPY (VATS)/WEDGE RESECTION: SHX6174

## 2019-06-16 HISTORY — PX: INTERCOSTAL NERVE BLOCK: SHX5021

## 2019-06-16 HISTORY — PX: LYMPH NODE DISSECTION: SHX5087

## 2019-06-16 HISTORY — DX: Malignant neoplasm of unspecified part of unspecified bronchus or lung: C34.90

## 2019-06-16 HISTORY — PX: VIDEO BRONCHOSCOPY: SHX5072

## 2019-06-16 LAB — GLUCOSE, CAPILLARY
Glucose-Capillary: 141 mg/dL — ABNORMAL HIGH (ref 70–99)
Glucose-Capillary: 244 mg/dL — ABNORMAL HIGH (ref 70–99)

## 2019-06-16 SURGERY — BRONCHOSCOPY, VIDEO-ASSISTED
Anesthesia: General | Site: Chest

## 2019-06-16 MED ORDER — LIDOCAINE 2% (20 MG/ML) 5 ML SYRINGE
INTRAMUSCULAR | Status: AC
  Filled 2019-06-16: qty 5

## 2019-06-16 MED ORDER — PROPOFOL 10 MG/ML IV BOLUS
INTRAVENOUS | Status: DC | PRN
  Administered 2019-06-16: 150 mg via INTRAVENOUS

## 2019-06-16 MED ORDER — NALOXONE HCL 0.4 MG/ML IJ SOLN: 0.4000 mg | INTRAMUSCULAR | Status: DC | PRN

## 2019-06-16 MED ORDER — ALPRAZOLAM 0.5 MG PO TABS: 0.5000 mg | ORAL_TABLET | Freq: Two times a day (BID) | ORAL | Status: DC | PRN

## 2019-06-16 MED ORDER — MORPHINE SULFATE 2 MG/ML IV SOLN
INTRAVENOUS | Status: DC
  Administered 2019-06-16: 11:00:00 via INTRAVENOUS
  Administered 2019-06-17: 0 mg via INTRAVENOUS
  Administered 2019-06-17: 6 mg via INTRAVENOUS
  Administered 2019-06-18 – 2019-06-19 (×2): 1.5 mg via INTRAVENOUS
  Administered 2019-06-19: 4.5 mg via INTRAVENOUS
  Administered 2019-06-19: 0 mg via INTRAVENOUS
  Administered 2019-06-20: 1.5 mg via INTRAVENOUS
  Administered 2019-06-20: 3 mg via INTRAVENOUS
  Administered 2019-06-21: 2 mg via INTRAVENOUS
  Administered 2019-06-21: 0 mg via INTRAVENOUS
  Administered 2019-06-21 – 2019-06-22 (×2): 1.5 mg via INTRAVENOUS
  Filled 2019-06-16: qty 30

## 2019-06-16 MED ORDER — LIDOCAINE 2% (20 MG/ML) 5 ML SYRINGE
INTRAMUSCULAR | Status: DC | PRN
  Administered 2019-06-16: 100 mg via INTRAVENOUS

## 2019-06-16 MED ORDER — 0.9 % SODIUM CHLORIDE (POUR BTL) OPTIME
TOPICAL | Status: DC | PRN
  Administered 2019-06-16: 2000 mL

## 2019-06-16 MED ORDER — BUPIVACAINE HCL (PF) 0.5 % IJ SOLN
INTRAMUSCULAR | Status: DC | PRN
  Administered 2019-06-16: 30 mL

## 2019-06-16 MED ORDER — INSULIN ASPART 100 UNIT/ML ~~LOC~~ SOLN
0.0000 [IU] | SUBCUTANEOUS | Status: DC
  Administered 2019-06-16 (×2): 8 [IU] via SUBCUTANEOUS
  Administered 2019-06-16: 23:00:00 4 [IU] via SUBCUTANEOUS
  Administered 2019-06-16 – 2019-06-17 (×2): 2 [IU] via SUBCUTANEOUS
  Administered 2019-06-17: 4 [IU] via SUBCUTANEOUS
  Administered 2019-06-17: 2 [IU] via SUBCUTANEOUS
  Administered 2019-06-18: 4 [IU] via SUBCUTANEOUS
  Administered 2019-06-18: 1 [IU] via SUBCUTANEOUS
  Administered 2019-06-20: 2 [IU] via SUBCUTANEOUS
  Administered 2019-06-21: 3 [IU] via SUBCUTANEOUS
  Administered 2019-06-22 (×2): 2 [IU] via SUBCUTANEOUS

## 2019-06-16 MED ORDER — PROPOFOL 10 MG/ML IV BOLUS
INTRAVENOUS | Status: AC
  Filled 2019-06-16: qty 20

## 2019-06-16 MED ORDER — ROCURONIUM BROMIDE 10 MG/ML (PF) SYRINGE
PREFILLED_SYRINGE | INTRAVENOUS | Status: DC | PRN
  Administered 2019-06-16: 35 mg via INTRAVENOUS
  Administered 2019-06-16: 65 mg via INTRAVENOUS

## 2019-06-16 MED ORDER — ATORVASTATIN CALCIUM 10 MG PO TABS
10.0000 mg | ORAL_TABLET | Freq: Every day | ORAL | Status: DC
  Administered 2019-06-17 – 2019-06-23 (×7): 10 mg via ORAL
  Filled 2019-06-16 (×7): qty 1

## 2019-06-16 MED ORDER — SODIUM CHLORIDE (PF) 0.9 % IJ SOLN
INTRAMUSCULAR | Status: DC | PRN
  Administered 2019-06-16: 50 mL via INTRAVENOUS

## 2019-06-16 MED ORDER — ROCURONIUM BROMIDE 10 MG/ML (PF) SYRINGE
PREFILLED_SYRINGE | INTRAVENOUS | Status: AC
  Filled 2019-06-16: qty 10

## 2019-06-16 MED ORDER — SUCCINYLCHOLINE CHLORIDE 20 MG/ML IJ SOLN
INTRAMUSCULAR | Status: DC | PRN
  Administered 2019-06-16: 120 mg via INTRAVENOUS

## 2019-06-16 MED ORDER — CEFAZOLIN SODIUM-DEXTROSE 2-4 GM/100ML-% IV SOLN
2.0000 g | INTRAVENOUS | Status: AC
  Administered 2019-06-16: 2 g via INTRAVENOUS
  Filled 2019-06-16: qty 100

## 2019-06-16 MED ORDER — DEXAMETHASONE SODIUM PHOSPHATE 10 MG/ML IJ SOLN
INTRAMUSCULAR | Status: AC
  Filled 2019-06-16: qty 1

## 2019-06-16 MED ORDER — ONDANSETRON HCL 4 MG/2ML IJ SOLN: 4.0000 mg | Freq: Four times a day (QID) | INTRAMUSCULAR | Status: DC | PRN

## 2019-06-16 MED ORDER — ONDANSETRON HCL 4 MG/2ML IJ SOLN
INTRAMUSCULAR | Status: DC | PRN
  Administered 2019-06-16: 4 mg via INTRAVENOUS

## 2019-06-16 MED ORDER — MORPHINE SULFATE 2 MG/ML IV SOLN
INTRAVENOUS | Status: AC
  Filled 2019-06-16: qty 30

## 2019-06-16 MED ORDER — LEVOTHYROXINE SODIUM 25 MCG PO TABS
125.0000 ug | ORAL_TABLET | ORAL | Status: DC
  Administered 2019-06-17 – 2019-06-23 (×6): 125 ug via ORAL
  Filled 2019-06-16 (×6): qty 1

## 2019-06-16 MED ORDER — SUGAMMADEX SODIUM 200 MG/2ML IV SOLN
INTRAVENOUS | Status: DC | PRN
  Administered 2019-06-16: 160 mg via INTRAVENOUS

## 2019-06-16 MED ORDER — OXYCODONE HCL 5 MG PO TABS: 5.0000 mg | ORAL_TABLET | ORAL | Status: DC | PRN

## 2019-06-16 MED ORDER — DIPHENHYDRAMINE HCL 50 MG/ML IJ SOLN: 12.5000 mg | Freq: Four times a day (QID) | INTRAMUSCULAR | Status: DC | PRN

## 2019-06-16 MED ORDER — DIPHENHYDRAMINE HCL 50 MG/ML IJ SOLN
INTRAMUSCULAR | Status: AC
  Filled 2019-06-16: qty 1

## 2019-06-16 MED ORDER — TRAMADOL HCL 50 MG PO TABS: 50.0000 mg | ORAL_TABLET | Freq: Four times a day (QID) | ORAL | Status: DC | PRN

## 2019-06-16 MED ORDER — ONDANSETRON HCL 4 MG/2ML IJ SOLN
4.0000 mg | Freq: Four times a day (QID) | INTRAMUSCULAR | Status: DC | PRN
  Administered 2019-06-19 – 2019-06-20 (×2): 4 mg via INTRAVENOUS
  Filled 2019-06-16 (×2): qty 2

## 2019-06-16 MED ORDER — MORPHINE SULFATE (PF) 2 MG/ML IV SOLN: 1.0000 mg | INTRAVENOUS | Status: DC | PRN

## 2019-06-16 MED ORDER — BISACODYL 5 MG PO TBEC
10.0000 mg | DELAYED_RELEASE_TABLET | Freq: Every day | ORAL | Status: DC
  Administered 2019-06-16 – 2019-06-23 (×8): 10 mg via ORAL
  Filled 2019-06-16 (×8): qty 2

## 2019-06-16 MED ORDER — ONDANSETRON HCL 4 MG/2ML IJ SOLN
INTRAMUSCULAR | Status: AC
  Filled 2019-06-16: qty 2

## 2019-06-16 MED ORDER — ALPRAZOLAM 0.5 MG PO TABS
0.5000 mg | ORAL_TABLET | Freq: Two times a day (BID) | ORAL | Status: DC | PRN
  Administered 2019-06-16 – 2019-06-22 (×11): 0.5 mg via ORAL
  Filled 2019-06-16 (×11): qty 1

## 2019-06-16 MED ORDER — SENNOSIDES-DOCUSATE SODIUM 8.6-50 MG PO TABS
1.0000 | ORAL_TABLET | Freq: Every day | ORAL | Status: DC
  Administered 2019-06-16 – 2019-06-22 (×7): 1 via ORAL
  Filled 2019-06-16 (×8): qty 1

## 2019-06-16 MED ORDER — FENTANYL CITRATE (PF) 100 MCG/2ML IJ SOLN
INTRAMUSCULAR | Status: DC | PRN
  Administered 2019-06-16 (×4): 50 ug via INTRAVENOUS

## 2019-06-16 MED ORDER — ACETAMINOPHEN 500 MG PO TABS
1000.0000 mg | ORAL_TABLET | Freq: Once | ORAL | Status: AC
  Administered 2019-06-16: 06:00:00 1000 mg via ORAL
  Filled 2019-06-16: qty 2

## 2019-06-16 MED ORDER — LACTATED RINGERS IV SOLN
INTRAVENOUS | Status: DC | PRN
  Administered 2019-06-16: 07:00:00 via INTRAVENOUS

## 2019-06-16 MED ORDER — ASPIRIN EC 81 MG PO TBEC
81.0000 mg | DELAYED_RELEASE_TABLET | Freq: Every day | ORAL | Status: DC
  Administered 2019-06-17 – 2019-06-23 (×7): 81 mg via ORAL
  Filled 2019-06-16 (×7): qty 1

## 2019-06-16 MED ORDER — CEFAZOLIN SODIUM-DEXTROSE 2-4 GM/100ML-% IV SOLN
2.0000 g | Freq: Three times a day (TID) | INTRAVENOUS | Status: AC
  Administered 2019-06-16 (×2): 2 g via INTRAVENOUS
  Filled 2019-06-16 (×2): qty 100

## 2019-06-16 MED ORDER — FENTANYL CITRATE (PF) 100 MCG/2ML IJ SOLN
25.0000 ug | INTRAMUSCULAR | Status: DC | PRN
  Administered 2019-06-16 (×2): 50 ug via INTRAVENOUS

## 2019-06-16 MED ORDER — HEMOSTATIC AGENTS (NO CHARGE) OPTIME
TOPICAL | Status: DC | PRN
  Administered 2019-06-16: 1 via TOPICAL

## 2019-06-16 MED ORDER — ACETAMINOPHEN 160 MG/5ML PO SOLN: 1000.0000 mg | Freq: Four times a day (QID) | ORAL | Status: AC

## 2019-06-16 MED ORDER — SODIUM CHLORIDE 0.9% FLUSH: 9.0000 mL | INTRAVENOUS | Status: DC | PRN

## 2019-06-16 MED ORDER — MIDAZOLAM HCL 2 MG/2ML IJ SOLN
INTRAMUSCULAR | Status: AC
  Filled 2019-06-16: qty 2

## 2019-06-16 MED ORDER — LEVOTHYROXINE SODIUM 50 MCG PO TABS
250.0000 ug | ORAL_TABLET | ORAL | Status: DC
  Administered 2019-06-22: 250 ug via ORAL
  Filled 2019-06-16: qty 2

## 2019-06-16 MED ORDER — ENOXAPARIN SODIUM 30 MG/0.3ML ~~LOC~~ SOLN
30.0000 mg | Freq: Every day | SUBCUTANEOUS | Status: DC
  Administered 2019-06-16 – 2019-06-22 (×7): 30 mg via SUBCUTANEOUS
  Filled 2019-06-16 (×7): qty 0.3

## 2019-06-16 MED ORDER — PROMETHAZINE HCL 25 MG/ML IJ SOLN: 6.2500 mg | INTRAMUSCULAR | Status: DC | PRN

## 2019-06-16 MED ORDER — DEXAMETHASONE SODIUM PHOSPHATE 10 MG/ML IJ SOLN
INTRAMUSCULAR | Status: DC | PRN
  Administered 2019-06-16: 10 mg via INTRAVENOUS

## 2019-06-16 MED ORDER — BUPIVACAINE LIPOSOME 1.3 % IJ SUSP
INTRAMUSCULAR | Status: DC | PRN
  Administered 2019-06-16: 20 mL

## 2019-06-16 MED ORDER — MIDAZOLAM HCL 2 MG/2ML IJ SOLN
INTRAMUSCULAR | Status: DC | PRN
  Administered 2019-06-16 (×2): 1 mg via INTRAVENOUS

## 2019-06-16 MED ORDER — DIPHENHYDRAMINE HCL 12.5 MG/5ML PO ELIX
12.5000 mg | ORAL_SOLUTION | Freq: Four times a day (QID) | ORAL | Status: DC | PRN
  Filled 2019-06-16: qty 5

## 2019-06-16 MED ORDER — INSULIN REGULAR(HUMAN) IN NACL 100-0.9 UT/100ML-% IV SOLN: INTRAVENOUS | Status: DC

## 2019-06-16 MED ORDER — DEXTROSE-NACL 5-0.45 % IV SOLN
INTRAVENOUS | Status: DC
  Administered 2019-06-16 – 2019-06-18 (×5): via INTRAVENOUS

## 2019-06-16 MED ORDER — SUCCINYLCHOLINE CHLORIDE 200 MG/10ML IV SOSY
PREFILLED_SYRINGE | INTRAVENOUS | Status: AC
  Filled 2019-06-16: qty 10

## 2019-06-16 MED ORDER — ASPIRIN 81 MG PO TABS: 81.0000 mg | ORAL_TABLET | Freq: Every day | ORAL | Status: DC

## 2019-06-16 MED ORDER — ACETAMINOPHEN 500 MG PO TABS
1000.0000 mg | ORAL_TABLET | Freq: Four times a day (QID) | ORAL | Status: AC
  Administered 2019-06-16 – 2019-06-19 (×11): 1000 mg via ORAL
  Administered 2019-06-19: 12:00:00 500 mg via ORAL
  Administered 2019-06-19 – 2019-06-21 (×5): 1000 mg via ORAL
  Filled 2019-06-16 (×19): qty 2

## 2019-06-16 MED ORDER — DIPHENHYDRAMINE HCL 50 MG/ML IJ SOLN
INTRAMUSCULAR | Status: DC | PRN
  Administered 2019-06-16: 12.5 mg via INTRAVENOUS

## 2019-06-16 MED ORDER — BUPIVACAINE HCL (PF) 0.5 % IJ SOLN
INTRAMUSCULAR | Status: AC
  Filled 2019-06-16: qty 30

## 2019-06-16 MED ORDER — FENTANYL CITRATE (PF) 250 MCG/5ML IJ SOLN
INTRAMUSCULAR | Status: AC
  Filled 2019-06-16: qty 5

## 2019-06-16 MED ORDER — LEVOTHYROXINE SODIUM 125 MCG PO TABS: 125.0000 ug | ORAL_TABLET | ORAL | Status: DC

## 2019-06-16 MED ORDER — FENTANYL CITRATE (PF) 100 MCG/2ML IJ SOLN
INTRAMUSCULAR | Status: AC
  Filled 2019-06-16: qty 2

## 2019-06-16 SURGICAL SUPPLY — 87 items
ADAPTER VALVE BIOPSY EBUS (MISCELLANEOUS) IMPLANT
ADH SKN CLS APL DERMABOND .7 (GAUZE/BANDAGES/DRESSINGS) ×3
ADPTR VALVE BIOPSY EBUS (MISCELLANEOUS)
APL SRG 22X2 LUM MLBL SLNT (VASCULAR PRODUCTS)
APL SRG 7X2 LUM MLBL SLNT (VASCULAR PRODUCTS)
APPLICATOR TIP COSEAL (VASCULAR PRODUCTS) IMPLANT
APPLICATOR TIP EXT COSEAL (VASCULAR PRODUCTS) IMPLANT
BAG SPEC RTRVL LRG 6X4 10 (ENDOMECHANICALS) ×3
BRUSH CYTOL CELLEBRITY 1.5X140 (MISCELLANEOUS) IMPLANT
CANISTER SUCT 3000ML PPV (MISCELLANEOUS) ×4 IMPLANT
CATH THORACIC 28FR (CATHETERS) IMPLANT
CATH THORACIC 36FR (CATHETERS) IMPLANT
CATH THORACIC 36FR RT ANG (CATHETERS) IMPLANT
CLIP VESOCCLUDE MED 6/CT (CLIP) ×3 IMPLANT
CONN ST 1/4X3/8  BEN (MISCELLANEOUS) ×1
CONN ST 1/4X3/8 BEN (MISCELLANEOUS) IMPLANT
CONT SPEC 4OZ CLIKSEAL STRL BL (MISCELLANEOUS) ×14 IMPLANT
CUTTER ECHEON FLEX ENDO 45 340 (ENDOMECHANICALS) ×1 IMPLANT
DERMABOND ADVANCED (GAUZE/BANDAGES/DRESSINGS) ×1
DERMABOND ADVANCED .7 DNX12 (GAUZE/BANDAGES/DRESSINGS) IMPLANT
DISSECTOR BLUNT TIP ENDO 5MM (MISCELLANEOUS) IMPLANT
DRAIN CHANNEL 28F RND 3/8 FF (WOUND CARE) ×1 IMPLANT
DRAIN CHANNEL 32F RND 10.7 FF (WOUND CARE) IMPLANT
DRAPE LAPAROSCOPIC ABDOMINAL (DRAPES) ×4 IMPLANT
DRILL BIT 7/64X5 (BIT) IMPLANT
ELECT BLADE 4.0 EZ CLEAN MEGAD (MISCELLANEOUS) ×4
ELECT BLADE 6.5 EXT (BLADE) ×4 IMPLANT
ELECT REM PT RETURN 9FT ADLT (ELECTROSURGICAL) ×4
ELECTRODE BLDE 4.0 EZ CLN MEGD (MISCELLANEOUS) ×3 IMPLANT
ELECTRODE REM PT RTRN 9FT ADLT (ELECTROSURGICAL) ×3 IMPLANT
FORCEPS BIOP RJ4 1.8 (CUTTING FORCEPS) IMPLANT
GAUZE SPONGE 4X4 12PLY STRL (GAUZE/BANDAGES/DRESSINGS) ×4 IMPLANT
GLOVE BIO SURGEON STRL SZ 6.5 (GLOVE) ×8 IMPLANT
GOWN STRL REUS W/ TWL LRG LVL3 (GOWN DISPOSABLE) ×12 IMPLANT
GOWN STRL REUS W/TWL LRG LVL3 (GOWN DISPOSABLE) ×16
HEMOSTAT SURGICEL 2X14 (HEMOSTASIS) ×1 IMPLANT
KIT BASIN OR (CUSTOM PROCEDURE TRAY) ×4 IMPLANT
KIT SUCTION CATH 14FR (SUCTIONS) ×5 IMPLANT
KIT TURNOVER KIT B (KITS) ×4 IMPLANT
MARKER SKIN DUAL TIP RULER LAB (MISCELLANEOUS) IMPLANT
NDL SPNL 18GX3.5 QUINCKE PK (NEEDLE) ×3 IMPLANT
NEEDLE SPNL 18GX3.5 QUINCKE PK (NEEDLE) ×4 IMPLANT
NS IRRIG 1000ML POUR BTL (IV SOLUTION) ×8 IMPLANT
PACK CHEST (CUSTOM PROCEDURE TRAY) ×4 IMPLANT
PAD ARMBOARD 7.5X6 YLW CONV (MISCELLANEOUS) ×11 IMPLANT
PASSER SUT SWANSON 36MM LOOP (INSTRUMENTS) ×1 IMPLANT
POUCH SPECIMEN RETRIEVAL 10MM (ENDOMECHANICALS) ×1 IMPLANT
RELOAD STAPLE 45 GOLD REG/THCK (STAPLE) IMPLANT
SCISSORS LAP 5X35 DISP (ENDOMECHANICALS) IMPLANT
SEALANT SURG COSEAL 4ML (VASCULAR PRODUCTS) IMPLANT
SEALANT SURG COSEAL 8ML (VASCULAR PRODUCTS) IMPLANT
SOLUTION ANTI FOG 6CC (MISCELLANEOUS) ×4 IMPLANT
SPONGE TONSIL TAPE 1 RFD (DISPOSABLE) IMPLANT
STAPLE RELOAD 45MM GOLD (STAPLE) ×16 IMPLANT
STAPLER ECHELON POWERED (MISCELLANEOUS) IMPLANT
STOPCOCK 4 WAY LG BORE MALE ST (IV SETS) ×4 IMPLANT
SUT PROLENE 3 0 SH DA (SUTURE) IMPLANT
SUT PROLENE 4 0 RB 1 (SUTURE)
SUT PROLENE 4-0 RB1 .5 CRCL 36 (SUTURE) IMPLANT
SUT SILK  1 MH (SUTURE) ×2
SUT SILK 1 MH (SUTURE) ×12 IMPLANT
SUT SILK 1 TIES 10X30 (SUTURE) IMPLANT
SUT SILK 2 0 SH (SUTURE) IMPLANT
SUT SILK 2 0SH CR/8 30 (SUTURE) IMPLANT
SUT STEEL 1 (SUTURE) IMPLANT
SUT VIC AB 0 CTX 18 (SUTURE) ×3 IMPLANT
SUT VIC AB 1 CTX 18 (SUTURE) ×2 IMPLANT
SUT VIC AB 1 CTX 36 (SUTURE)
SUT VIC AB 1 CTX36XBRD ANBCTR (SUTURE) IMPLANT
SUT VIC AB 2-0 CTX 36 (SUTURE) ×2 IMPLANT
SUT VIC AB 3-0 SH 8-18 (SUTURE) IMPLANT
SUT VIC AB 3-0 X1 27 (SUTURE) ×1 IMPLANT
SUT VICRYL 0 UR6 27IN ABS (SUTURE) IMPLANT
SUT VICRYL 2 TP 1 (SUTURE) ×1 IMPLANT
SYR 5ML LL (SYRINGE) ×4 IMPLANT
SYRINGE 60CC LL (MISCELLANEOUS) ×4 IMPLANT
SYSTEM SAHARA CHEST DRAIN ATS (WOUND CARE) ×4 IMPLANT
TAPE CLOTH SURG 4X10 WHT LF (GAUZE/BANDAGES/DRESSINGS) ×1 IMPLANT
TAPE UMBILICAL COTTON 1/8X30 (MISCELLANEOUS) IMPLANT
TOWEL GREEN STERILE (TOWEL DISPOSABLE) ×4 IMPLANT
TOWEL GREEN STERILE FF (TOWEL DISPOSABLE) ×4 IMPLANT
TRAP SPECIMEN MUCOUS 40CC (MISCELLANEOUS) IMPLANT
TRAY FOLEY MTR SLVR 16FR STAT (SET/KITS/TRAYS/PACK) ×4 IMPLANT
TROCAR BLADELESS 12MM (ENDOMECHANICALS) ×1 IMPLANT
TROCAR XCEL 12X100 BLDLESS (ENDOMECHANICALS) IMPLANT
TUBE CONNECTING 20X1/4 (TUBING) ×4 IMPLANT
WATER STERILE IRR 1000ML POUR (IV SOLUTION) ×4 IMPLANT

## 2019-06-16 NOTE — Progress Notes (Signed)
Dr Tobias Alexander notified of obvious puffiness to left breast. New since arrival to PACU. Soft, but obviously different from right breast. No new orders from Dr Tobias Alexander.  Patient still maintaining O2 sat at 97% on 4L Nasal Cannula. Denies shortness of breath. Lung sounds clear, but diminished. Dr Servando Snare also notified.   32 Dr Servando Snare at bedside to assess patient. States it it air, visible on chest x ray. No new orders given and will continue to monitor.

## 2019-06-16 NOTE — Anesthesia Procedure Notes (Signed)
Arterial Line Insertion Start/End6/22/2020 6:45 AM, 06/16/2019 6:50 AM Performed by: Barrington Ellison, CRNA, CRNA  Patient location: Pre-op. Preanesthetic checklist: patient identified Lidocaine 1% used for infiltration and patient sedated Left, radial was placed Catheter size: 20 G Hand hygiene performed  and maximum sterile barriers used  Allen's test indicative of satisfactory collateral circulation Attempts: 1 Procedure performed without using ultrasound guided technique. Following insertion, dressing applied and Biopatch. Post procedure assessment: normal  Patient tolerated the procedure well with no immediate complications.

## 2019-06-16 NOTE — Brief Op Note (Addendum)
      [address]       [place] [address]             [phone]      06/16/2019  9:55 AM  PATIENT:  [facility] Irwin  73 y.o. male  PRE-OPERATIVE DIAGNOSIS:  LUL LUNG MASS  POST-OPERATIVE DIAGNOSIS:  LUL LUNG MASS  PROCEDURE:  Procedure(s): VIDEO BRONCHOSCOPY (N/A) VIDEO ASSISTED THORACOSCOPY (VATS)/MINI THOROCOTOMY WITH LEFT UPPER LOBE WEDGE RESECTION (Left) Intercostal Nerve Block Lymph Node Dissection  SURGEON:  Surgeon(s) and Role:    * Grace Isaac, MD - Primary  PHYSICIAN ASSISTANT:  Nicholes Rough, PA-C   ANESTHESIA:   general  EBL:  50 mL   BLOOD ADMINISTERED:none  DRAINS: ONE BLAKE DRAIN   LOCAL MEDICATIONS USED:  BUPIVICAINE   SPECIMEN:  Source of Specimen:  PORTION OF THE LEFT UPPER LOBE and lymph nodes  DISPOSITION OF SPECIMEN:  PATHOLOGY  COUNTS:  YES   DICTATION: .Dragon Dictation  PLAN OF CARE: Admit to inpatient   PATIENT DISPOSITION:  ICU - intubated and hemodynamically stable.   Delay start of Pharmacological VTE agent (>24hrs) due to surgical blood loss or risk of bleeding: no

## 2019-06-16 NOTE — Anesthesia Procedure Notes (Signed)
Central Venous Catheter Insertion Performed by: Duane Boston, MD, anesthesiologist Start/End6/22/2020 6:54 AM, 06/16/2019 7:04 AM Patient location: Pre-op. Preanesthetic checklist: patient identified, IV checked, site marked, risks and benefits discussed, surgical consent, monitors and equipment checked, pre-op evaluation, timeout performed and anesthesia consent Position: Trendelenburg Lidocaine 1% used for infiltration and patient sedated Hand hygiene performed , maximum sterile barriers used  and Seldinger technique used Catheter size: 8 Fr Total catheter length 16. Central line was placed.Double lumen Procedure performed using ultrasound guided technique. Ultrasound Notes:image(s) printed for medical record Attempts: 1 Following insertion, dressing applied, line sutured and Biopatch. Post procedure assessment: blood return through all ports, free fluid flow and no air  Patient tolerated the procedure well with no immediate complications. Additional procedure comments: Attempted Left Subclavian without success. Moved to Left IJ.

## 2019-06-16 NOTE — Anesthesia Postprocedure Evaluation (Signed)
Anesthesia Post Note  Patient: Bradley Irwin  Procedure(s) Performed: VIDEO BRONCHOSCOPY (N/A ) VIDEO ASSISTED THORACOSCOPY (VATS)/MINI THOROCOTOMY WITH LEFT UPPER LOBE WEDGE RESECTION (Left Chest) Intercostal Nerve Block Lymph Node Dissection     Patient location during evaluation: PACU Anesthesia Type: General Level of consciousness: sedated Pain management: pain level controlled Vital Signs Assessment: post-procedure vital signs reviewed and stable Respiratory status: spontaneous breathing and respiratory function stable Cardiovascular status: stable Postop Assessment: no apparent nausea or vomiting Anesthetic complications: no    Last Vitals:  Vitals:   06/16/19 1130 06/16/19 1131  BP: 113/65   Pulse:    Resp: 11 14  Temp: 36.5 C   SpO2: 93%     Last Pain:  Vitals:   06/16/19 1130  TempSrc:   PainSc: 4                  Mitch Arquette DANIEL

## 2019-06-16 NOTE — Anesthesia Procedure Notes (Signed)
Procedure Name: Intubation Date/Time: 06/16/2019 7:39 AM Performed by: Barrington Ellison, CRNA Pre-anesthesia Checklist: Patient identified, Emergency Drugs available, Suction available and Patient being monitored Patient Re-evaluated:Patient Re-evaluated prior to induction Oxygen Delivery Method: Circle System Utilized Preoxygenation: Pre-oxygenation with 100% oxygen Induction Type: IV induction and Rapid sequence Laryngoscope Size: Mac and 3 Grade View: Grade I Tube type: Oral Endobronchial tube: Double lumen EBT, Left and EBT position confirmed by fiberoptic bronchoscope Number of attempts: 1 Airway Equipment and Method: Stylet,  Oral airway and Fiberoptic brochoscope Placement Confirmation: ETT inserted through vocal cords under direct vision,  positive ETCO2 and breath sounds checked- equal and bilateral Secured at: 29 cm Tube secured with: Tape Dental Injury: Teeth and Oropharynx as per pre-operative assessment

## 2019-06-16 NOTE — H&P (Signed)
[address]       [place] [address]             [phone]                    Bradley Irwin Medical Record #[id number] Date of Birth: 08-11-1946 Referring: [name], MD Primary Care: [name], MD Primary Cardiologist: [name], MD  Chief Complaint:   Enlarging left upper lobe lung mass   History of Present Illness:  The patient gives a remote history of smoking but quit in  November 1985. In  2017 the patient had a CT scan of the chest in February May and June of that year initially was done for follow-up for his renal carcinoma.  He had no specific pulmonary symptoms at that time.  A left upper lobe lung lesion was present at that time and appeared fairly stable over the time.  A follow-up CT scan of the chest was done in 2018 and again in March 2020. The area in the left upper lobe appeared to enlarge, somewhat multiloculated associated with a cystic area.  Approximate 1.4 cm in size.  Follow-up PET scan was done and showed the area to be hypermetabolic.   Patient has a renal cell carcinoma s/p right nephrectomy-resected by Dr. [name] in January 2008  Patient has a history of CAD, HTN, HLD,Cardiac cath February 2006 with a 2.75 x 28 mm Costar study stent (DES) placed in the mid LAD. Last cath November 2006 with patent mid LAD stent, 40% proximal LAD stenosis, 80% small diagonal branch stenosis, minimal disease in the Circumflex and RCA. Echo 03/18/13 with normal LV function, mild LVH. Nuclear stress test May 2016 with no evidence of ischemia.   Laparoscopic partial hepatectomy 10/2011-benign focal hyperplasia no malignancy of the liver noted  Is relatively active with out angina and usual daily activities, he notes that if he was to run he would likely have shortness of breath and chest discomfort.  He had  PFTs done yesterday   Family history is significant for father who died of lung cancer in the 31s  PFT's done    FEV1 3.01   110 % DLCO 20.25  89% The FVC, FEV1, FEV1/FVC ratio and FEF25-75% are within normal limits. The airway resistance is normal. Lung volumes are within normal limits. Following administration of bronchodilators, there is no significant response. The diffusing capacity is normal. However, the diffusing capacity was not corrected for the patient's hemoglobin. Conclusions: The results are within normal limits. Pulmonary Function Diagnosis: Normal Pulmonary Function   Cardiac Stress test done 04/23/2019  Study Highlights     The left ventricular ejection fraction is normal (55-65%).  Nuclear stress EF: 64%.  No T wave inversion was noted during stress.  This is a low risk study.   Normal perfusion. LVEF 64% with normal wall motion. This is a low risk study.      Current Activity/ Functional Status:  Patient is independent with mobility/ambulation, transfers, ADL's, IADL's.   Zubrod Score: At the time of surgery this patients most appropriate activity status/level should be described as: []     0    Normal activity, no symptoms [x]     1    Restricted in physical strenuous activity but ambulatory, able to do out light work []     2    Ambulatory and capable of self care, unable to do work activities, up and about               >  50 % of waking hours                              []     3    Only limited self care, in bed greater than 50% of waking hours []     4    Completely disabled, no self care, confined to bed or chair []     5    Moribund   Past Medical History:  Diagnosis Date   Anxiety    Coronary artery disease    Eczema    GERD (gastroesophageal reflux disease)    Hearing loss    Hx of cardiovascular stress test    Lexiscan Myoview 5/16:  The study is normal. No ischemia identified. This is a low risk study. Overall left ventricular systolic function was normal. LV cavity size is normal. The left ventricular ejection fraction is normal (55-65%).  Sensitivity reduced by bowel loop attenuation artifact.   Hyperlipidemia    Hypertension    Hypothyroidism    IBS (irritable bowel syndrome)    Psoriasis    PVD (peripheral vascular disease) ([place])    Rash    Renal cell cancer ([place])    Wears glasses    Wears hearing aid    both ears    Past Surgical History:  Procedure Laterality Date   CARDIAC CATHETERIZATION     CHOLECYSTECTOMY  10/26/11   COLONOSCOPY     CORONARY STENT PLACEMENT  2006   FINGER SURGERY  73 y/o   cut index rt   LIPOMA EXCISION Left 11/18/2013   Procedure: EXCISION POSTERIOR NECK LIPOMA ;  Surgeon: Stark Klein, MD;  Location: [facility];  Service: General;  Laterality: Left;   LIVER BIOPSY  09/18/11   LIVER LOBECTOMY  10/26/11   NEPHRECTOMY  2008   right   TONSILLECTOMY      Family History  Problem Relation Age of Onset   Hypertension Mother    Heart failure Mother    Heart disease Mother    Peripheral vascular disease Father    Lung cancer Father    Cancer Father        lung   Hypertension Sister    Heart attack Maternal Uncle    Colon cancer Neg Hx      Social History   Tobacco Use  Smoking Status Former Smoker   Packs/day: 1.00   Years: 15.00   Pack years: 15.00   Types: Cigarettes   Quit date: 10/26/1983   Years since quitting: 35.6  Smokeless Tobacco Never Used    Social History   Substance and Sexual Activity  Alcohol Use Yes   Alcohol/week: 14.0 - 20.0 standard drinks   Types: 14 - 20 Glasses of wine per week   Comment: 2-3 per day - wine, beer, liquor     Allergies  Allergen Reactions   Sulfa Antibiotics Other (See Comments)    Happened when he was child. Does not remember reaction.   Demerol Other (See Comments)    Severe headache.   Pseudoephedrine Other (See Comments)    Hypertension     Current Facility-Administered Medications  Medication Dose Route Frequency Provider Last Rate Last Dose   0.9 % irrigation  (POUR BTL)    PRN Grace Isaac, MD   2,000 mL at 06/16/19 0703   bupivacaine (MARCAINE) 0.5 % injection    PRN Grace Isaac, MD   30 mL at  06/16/19 0703   bupivacaine liposome (EXPAREL) 1.3 % injection    PRN Grace Isaac, MD   20 mL at 06/16/19 0704   ceFAZolin (ANCEF) IVPB 2g/100 mL premix  2 g Intravenous 30 min Pre-Op Grace Isaac, MD       sodium chloride (PF) 0.9 % injection    PRN Grace Isaac, MD   50 mL at 06/16/19 0923   Facility-Administered Medications Ordered in Other Encounters  Medication Dose Route Frequency Provider Last Rate Last Dose   fentaNYL (SUBLIMAZE) injection   Intravenous Anesthesia Intra-op Barrington Ellison, CRNA   50 mcg at 06/16/19 3007   lactated ringers infusion   Intravenous Continuous PRN Barrington Ellison, CRNA       midazolam (VERSED) injection   Intravenous Anesthesia Intra-op Barrington Ellison, CRNA   1 mg at 06/16/19 0700    Pertinent items are noted in HPI.   Review of Systems:     Cardiac Review of Systems: [Y] = yes  or   [ N ] = no   Chest Pain [ N  ]  Resting SOB [  N] Exertional SOB  [ Y]  Orthopnea N  ]   Pedal Edema Aqua.Slicker ]    Palpitations [ N] Syncope  [ N]   Presyncope N   ]   General Review of Systems: [Y] = yes [  ]=no Constitional: recent weight change [  ];  Wt loss over the last 3 months [   ] anorexia [  ]; fatigue [  ]; nausea [  ]; night sweats [  ]; fever [  ]; or chills [  ];           Eye : blurred vision [  ]; diplopia [   ]; vision changes [  ];  Amaurosis fugax[  ]; Resp: cough [ y ];  wheezing[ y ];  hemoptysis[  ]; shortness of breath[  ]; paroxysmal nocturnal dyspnea[  ]; dyspnea on exertion[  ]; or orthopnea[  ];  GI:  gallstones[  ], vomiting[  ];  dysphagia[  ]; melena[  ];  hematochezia [  ]; heartburn[  ];   Hx of  Colonoscopy[  ]; GU: kidney stones [  ]; hematuria[  ];   dysuria [  ];  nocturia[  ];  history of     obstruction [  ]; urinary frequency [  ]             Skin: rash, swelling[   ];, hair loss[  ];  peripheral edema[  ];  or itching[  ]; Musculosketetal: myalgias[  ];  joint swelling[  ];  joint erythema[  ];  joint pain[  ];  back pain[  ];  Heme/Lymph: bruising[  ];  bleeding[  ];  anemia[  ];  Neuro: TIA[  ];  headaches[  ];  stroke[  ];  vertigo[  ];  seizures[  ];   paresthesias[  ];  difficulty walking[  ];  Psych:depression[  ]; anxiety[  ];  Endocrine: diabetes[  ];  thyroid dysfunction[  ];  Immunizations: Flu up to date [ Y ]; Pneumococcal up to date [ Y ];  Other:     PHYSICAL EXAMINATION: BP (!) 177/77    Pulse 72    Temp 98.2 F (36.8 C) (Oral)    Resp 20    Ht 5\' 6"  (1.676 m)    Wt 83.9 kg    SpO2 98%  BMI 29.86 kg/m  General appearance: alert, cooperative and no distress Head: Normocephalic, without obvious abnormality, atraumatic Neck: no adenopathy, no carotid bruit, no JVD, supple, symmetrical, trachea midline and thyroid not enlarged, symmetric, no tenderness/mass/nodules Lymph nodes: Cervical, supraclavicular, and axillary nodes normal. Resp: clear to auscultation bilaterally Back: symmetric, no curvature. ROM normal. No CVA tenderness. Cardio: regular rate and rhythm, S1, S2 normal, no murmur, click, rub or gallop GI: soft, non-tender; bowel sounds normal; no masses,  no organomegaly Extremities: extremities normal, atraumatic, no cyanosis or edema and Homans sign is negative, no sign of DVT Neurologic: Grossly normal   Diagnostic Studies & Laboratory data:     Recent Radiology Findings:  Dg Chest 2 View  Result Date: 06/16/2019 CLINICAL DATA:  Preop evaluation EXAM: CHEST - 2 VIEW COMPARISON:  Chest CT 03/05/2019 FINDINGS: Known left upper lobe lesion. There is no edema, consolidation, effusion, or pneumothorax. Normal heart size and mediastinal contours. Coronary stent is present. IMPRESSION: 1. No acute finding. 2. Left upper lobe nodule. Electronically Signed   By: Monte Fantasia M.D.   On: 06/16/2019 06:40    Ct Soft Tissue Neck  W Contrast  Result Date: 03/05/2019 CLINICAL DATA:  Question parotid mass. History of lipoma resection in the neck. History of tonsillectomy. EXAM: CT NECK WITH CONTRAST TECHNIQUE: Multidetector CT imaging of the neck was performed using the standard protocol following the bolus administration of intravenous contrast. CONTRAST:  155mL ISOVUE-300 IOPAMIDOL (ISOVUE-300) INJECTION 61% COMPARISON:  None. FINDINGS: Pharynx and larynx: No mucosal or submucosal lesion. Salivary glands: Both parotid glands show fatty change and prominence. This can be seen with Sjogren syndrome. There are intraparotid lymph nodes slightly prominent similar to the other nodes in the neck described below. I do not see a parotid finding that I thinks specifically represents a parotid neoplasm. Any anyone these intraparotid densities could possibly represent at a parotid mass, but they are all well defined and if so, would likely represent Warthin's tumor. Submandibular glands are normal. Thyroid: Diminutive thyroid tissue.  No sign of thyroid mass. Lymph nodes: Slight prominence of the lymph nodes throughout all nodal stations of the neck in a bilaterally symmetric fashion. However, none measures outside of the range of normal. Level 2 lymph node on the right shows largest short axis dimension of 11 mm, within normal limits. Largest nodes at the other nodal stations shows short axis dimensions of 9 mm, within normal limits. No low-density nodes. Vascular: Ordinary atherosclerotic change of the carotid bifurcations. Limited intracranial: Normal Visualized orbits: Limited.  Normal. Mastoids and visualized paranasal sinuses: Mucosal thickening of the maxillary sinus floors. No layering fluid. No fluid in the mastoids. Skeleton: Ordinary cervical spondylosis. Upper chest: See results of chest CT. Other: Small sebaceous cyst of the posterior neck midline. IMPRESSION: The parotid glands show enlargement and fatty change. This can be seen with  Sjogren syndrome. Multiple bilateral intraparotid nodules most consistent with parotid lymph nodes. See above discussion. Not possible to exclude a parotid mass such as Warthin's tumor, but lymph nodes are favored. In general, the lymph nodes of the neck are all at the upper limits of normal in size, but are asymmetric or exceed the range of normal. Electronically Signed   By: Nelson Chimes M.D.   On: 03/05/2019 16:04   Ct Chest W Contrast  Result Date: 03/05/2019 CLINICAL DATA:  Follow up left upper lobe pulmonary nodule. History of right nephrectomy in 2008 for renal cell carcinoma. EXAM: CT CHEST WITH CONTRAST TECHNIQUE: Multidetector  CT imaging of the chest was performed during intravenous contrast administration. CONTRAST:  144mL ISOVUE-300 IOPAMIDOL (ISOVUE-300) INJECTION 61% COMPARISON:  Prior chest CTs ranging from 05/18/2016 through 02/19/2017. FINDINGS: Cardiovascular: Atherosclerosis of the aorta, great vessels and coronary arteries with a possible coronary artery stent. No acute vascular findings. The heart size is normal. There is no pericardial effusion. Mediastinum/Nodes: Again demonstrated are multiple prominent lower cervical, axillary, mediastinal and hilar lymph nodes bilaterally, similar to the previous studies. Representative nodes include a 12 mm AP window node on image 85/5 and a 17 mm right hilar node on image 97/5. The thyroid gland, trachea and esophagus demonstrate no significant findings. Lungs/Pleura: There is no pleural effusion. There is mild centrilobular emphysema and cylindrical bronchiectasis. There has been significant progression in the soft tissue components of the previously described left upper lobe lesion. The soft tissue components now measure up to 2.0 x 1.4 cm on image 95 and have irregular margins, highly worrisome for bronchogenic carcinoma. The central cavitary component of this nodule is unchanged. Mild right upper lobe nodularity is stable. Upper abdomen: There is  diffuse hepatic steatosis without focal hepatic abnormality. Complex cystic lesion medially in the spleen has not significantly changed. There is no evidence of mass in the right nephrectomy bed. Musculoskeletal/Chest wall: There is no chest wall mass or suspicious osseous finding. IMPRESSION: 1. Significant progression in the soft tissue components associated with the left upper lobe nodule, highly suspicious for bronchogenic carcinoma. Referral to multi disciplinary thoracic Oncology Clinic recommended. PET-CT may be helpful for further staging. 2. No other new or enlarging pulmonary nodules. 3. Chronic thoracic adenopathy attributed to a chronic benign process. 4. Stable findings in the upper abdomen, including hepatic steatosis and a complex cystic splenic lesion. 5. These results will be called to the ordering clinician or representative by the Radiologist Assistant, and communication documented in the PACS or zVision Dashboard. Electronically Signed   By: Richardean Sale M.D.   On: 03/05/2019 17:27   Left lung lesion present in 2017 :   Now:   Nm Pet Image Initial (pi) Skull Base To Thigh  Result Date: 03/14/2019 CLINICAL DATA:  Initial treatment strategy for left upper lobe pulmonary nodule. History of right nephrectomy for renal cell carcinoma. EXAM: NUCLEAR MEDICINE PET SKULL BASE TO THIGH TECHNIQUE: 9.9 mCi F-18 FDG was injected intravenously. Full-ring PET imaging was performed from the skull base to thigh after the radiotracer. CT data was obtained and used for attenuation correction and anatomic localization. Fasting blood glucose: 96 mg/dl COMPARISON:  CT scan 03/05/2019 FINDINGS: Mediastinal blood pool activity: SUV max 2.62 NECK: Stable numerous scattered neck nodes including intraparotid nodes. Low level hypermetabolism noted in some of these nodes ranging between 1.5 and 2.6 SUV max. 9 mm right level 2 lymph node on image number 31 has an SUV max of 2.59. Incidental CT findings: none  CHEST: Numerous small supraclavicular and borderline enlarged axillary lymph nodes are noted. These appear relatively stable when compared to the prior chest CT from 2018. 13 mm right axillary node on image number 61 has an SUV max of 2.20. 12 mm left axillary lymph node on image number 65 has an SUV max of 2.35. 12 mm lateral left axillary lymph node on image number 57 has an SUV max of 2.39. Moderate bilateral symmetric gynecomastia is noted. 10 mm prevascular lymph node on image number 70 has an SUV max of 2.35. No other enlarged or hypermetabolic mediastinal or hilar lymph nodes are identified. The  23 mm semi solid left upper lobe lung lesion is hypermetabolic with SUV max of 4.97. This is suspicious for primary adenocarcinoma of the lung. Stable semi solid nodule in the right upper lobe on image number 22 measuring 8 mm. No hypermetabolism. Incidental CT findings: Three-vessel coronary artery calcifications. ABDOMEN/PELVIS: No abnormal hypermetabolic activity within the liver, pancreas, adrenal glands, or spleen. Scattered small retroperitoneal and pelvic lymph nodes but no hypermetabolism. No inguinal adenopathy. Small scattered inguinal lymph nodes weakly hypermetabolic. The right kidney is surgically absent. No worrisome findings in the nephrectomy bed. Incidental CT findings: Stable splenic lesion consistent with a benign cyst. There is also diffuse fatty infiltration of the liver and evidence of prior right hepatic lobe surgery and a cholecystectomy. SKELETON: No significant findings. Incidental CT findings: none IMPRESSION: 1. Left upper lobe lung lesion is hypermetabolic and consistent with primary lung neoplasm, likely adenocarcinoma. 2. Numerous borderline bilateral neck, supraclavicular and axillary lymph nodes. Low level hypermetabolism suggesting chronic inflammatory change. 3. No enlarged or hypermetabolic mediastinal or hilar lymph nodes to suggest metastatic disease. 4. Stable 8 mm semi solid  right upper lobe pulmonary nodule. Recommend continued observation/surveillance. Electronically Signed   By: Marijo Sanes M.D.   On: 03/14/2019 12:22     I have independently reviewed the above radiology studies  and reviewed the findings with the patient.   Recent Lab Findings: Lab Results  Component Value Date   WBC 15.3 (H) 06/12/2019   HGB 14.6 06/12/2019   HCT 44.0 06/12/2019   PLT 194 06/12/2019   GLUCOSE 123 (H) 06/12/2019   ALT 80 (H) 06/12/2019   AST 78 (H) 06/12/2019   NA 137 06/12/2019   K 3.7 06/12/2019   CL 102 06/12/2019   CREATININE 1.43 (H) 06/12/2019   BUN 18 06/12/2019   CO2 21 (L) 06/12/2019   INR 1.0 06/12/2019    Assessment / Plan:   #1 slowly enlarging left upper lobe lesion present on CT scan in 0263 now hypermetabolic 23 mm in size, although it has changed over several years is highly suspicious for adenocarcinoma of the lung. -Treatment options could be surgical resection versus stereotactic radiotherapy.  Currently there is no tissue diagnosis.  The patient's had pulmonary function studies done.  Patient has reasonable pulmonary function recent negative stress test and no cardiac symptoms.  He is reasonable candidate for surgical resection versus stereotactic radiotherapy.  I discussed the treatment options with him including stereotactic radiotherapy versus surgical resection primarily.  He prefers to proceed with primary surgical resection, but would like to wait approximately 3 more weeks so his wife has opportunity to recover from colon surgery.    The procedure to be done has been  described to him in detail including potential complications expected length of stay, presence of drainage tubes in anticipation post hospital care.  Patient has had his questions answered and is willing to proceed    #2 known coronary artery disease-currently on Plavix- Seen by cardiology in anticipation of lung resection  -patient is on Plavix and is aware that we will need to  stop this 5 days prior to surgery.  # 3 History of Renal  Cell CA-no evidence of recurrence    The goals risks and alternatives of the planned surgical procedure Procedure(s): VIDEO BRONCHOSCOPY (N/A) VIDEO ASSISTED THORACOSCOPY (VATS)/LUNG RESECTION (Left)  have been discussed with the patient in detail. The risks of the procedure including death, infection, stroke, myocardial infarction, bleeding, blood transfusion have all been discussed specifically.  I have quoted Bradley Irwin a 2 % of perioperative mortality and a complication rate as high as 30%. The patient's questions have been answered.Bradley Irwin is willing  to proceed with the planned procedure.   Grace Isaac MD      [address] [place] [address] Office [phone]   [facility]

## 2019-06-16 NOTE — Transfer of Care (Signed)
Immediate Anesthesia Transfer of Care Note  Patient: Bradley Irwin  Procedure(s) Performed: VIDEO BRONCHOSCOPY (N/A ) VIDEO ASSISTED THORACOSCOPY (VATS)/MINI THOROCOTOMY WITH LEFT UPPER LOBE WEDGE RESECTION (Left Chest) Intercostal Nerve Block Lymph Node Dissection  Patient Location: PACU  Anesthesia Type:General  Level of Consciousness: drowsy and patient cooperative  Airway & Oxygen Therapy: Patient Spontanous Breathing and Patient connected to nasal cannula oxygen  Post-op Assessment: Report given to RN and Patient moving all extremities X 4  Post vital signs: Reviewed and stable  Last Vitals:  Vitals Value Taken Time  BP 156/123 06/16/19 1016  Temp    Pulse 67 06/16/19 1017  Resp 16 06/16/19 1017  SpO2 93 % 06/16/19 1017  Vitals shown include unvalidated device data.  Last Pain:  Vitals:   06/16/19 0626  TempSrc: Oral  PainSc:       Patients Stated Pain Goal: 4 (91/66/06 0045)  Complications: No apparent anesthesia complications

## 2019-06-16 NOTE — Progress Notes (Signed)
CT surgery p.m. Rounds  Status post left VATS, left upper lobectomy Patient sedated, breathing comfortably Minimal serosanguineous drainage from chest tubes Oxygen saturation 94% Normal sinus rhythm

## 2019-06-17 ENCOUNTER — Encounter (HOSPITAL_COMMUNITY): Payer: Self-pay | Admitting: Cardiothoracic Surgery

## 2019-06-17 ENCOUNTER — Inpatient Hospital Stay (HOSPITAL_COMMUNITY): Payer: Medicare Other

## 2019-06-17 ENCOUNTER — Encounter: Payer: Self-pay | Admitting: Cardiothoracic Surgery

## 2019-06-17 LAB — CBC
HCT: 35.1 % — ABNORMAL LOW (ref 39.0–52.0)
Hemoglobin: 11.7 g/dL — ABNORMAL LOW (ref 13.0–17.0)
MCH: 30.3 pg (ref 26.0–34.0)
MCHC: 33.3 g/dL (ref 30.0–36.0)
MCV: 90.9 fL (ref 80.0–100.0)
Platelets: 152 10*3/uL (ref 150–400)
RBC: 3.86 MIL/uL — ABNORMAL LOW (ref 4.22–5.81)
RDW: 13.5 % (ref 11.5–15.5)
WBC: 12 10*3/uL — ABNORMAL HIGH (ref 4.0–10.5)
nRBC: 0 % (ref 0.0–0.2)

## 2019-06-17 LAB — POCT I-STAT 7, (LYTES, BLD GAS, ICA,H+H)
Acid-base deficit: 1 mmol/L (ref 0.0–2.0)
Bicarbonate: 23.4 mmol/L (ref 20.0–28.0)
Calcium, Ion: 1.22 mmol/L (ref 1.15–1.40)
HCT: 35 % — ABNORMAL LOW (ref 39.0–52.0)
Hemoglobin: 11.9 g/dL — ABNORMAL LOW (ref 13.0–17.0)
O2 Saturation: 94 %
Patient temperature: 98.6
Potassium: 3.2 mmol/L — ABNORMAL LOW (ref 3.5–5.1)
Sodium: 137 mmol/L (ref 135–145)
TCO2: 25 mmol/L (ref 22–32)
pCO2 arterial: 38.7 mmHg (ref 32.0–48.0)
pH, Arterial: 7.39 (ref 7.350–7.450)
pO2, Arterial: 71 mmHg — ABNORMAL LOW (ref 83.0–108.0)

## 2019-06-17 LAB — BASIC METABOLIC PANEL
Anion gap: 10 (ref 5–15)
BUN: 16 mg/dL (ref 8–23)
CO2: 23 mmol/L (ref 22–32)
Calcium: 8.6 mg/dL — ABNORMAL LOW (ref 8.9–10.3)
Chloride: 103 mmol/L (ref 98–111)
Creatinine, Ser: 1.36 mg/dL — ABNORMAL HIGH (ref 0.61–1.24)
GFR calc Af Amer: >60 mL/min (ref >60)
GFR calc non Af Amer: 52 mL/min — ABNORMAL LOW (ref >60)
Glucose, Bld: 174 mg/dL — ABNORMAL HIGH (ref 70–99)
Potassium: 3.2 mmol/L — ABNORMAL LOW (ref 3.5–5.1)
Sodium: 136 mmol/L (ref 135–145)

## 2019-06-17 LAB — GLUCOSE, CAPILLARY
Glucose-Capillary: 215 mg/dL — ABNORMAL HIGH (ref 70–99)
Glucose-Capillary: 188 mg/dL — ABNORMAL HIGH (ref 70–99)
Glucose-Capillary: 172 mg/dL — ABNORMAL HIGH (ref 70–99)
Glucose-Capillary: 146 mg/dL — ABNORMAL HIGH (ref 70–99)
Glucose-Capillary: 89 mg/dL (ref 70–99)
Glucose-Capillary: 142 mg/dL — ABNORMAL HIGH (ref 70–99)
Glucose-Capillary: 82 mg/dL (ref 70–99)
Glucose-Capillary: 102 mg/dL — ABNORMAL HIGH (ref 70–99)

## 2019-06-17 MED ORDER — INSULIN DETEMIR 100 UNIT/ML ~~LOC~~ SOLN
10.0000 [IU] | Freq: Every day | SUBCUTANEOUS | Status: DC
  Administered 2019-06-17 – 2019-06-23 (×7): 10 [IU] via SUBCUTANEOUS
  Filled 2019-06-17 (×7): qty 0.1

## 2019-06-17 MED ORDER — POTASSIUM CHLORIDE CRYS ER 20 MEQ PO TBCR
20.0000 meq | EXTENDED_RELEASE_TABLET | ORAL | Status: AC
  Administered 2019-06-17 (×3): 20 meq via ORAL
  Filled 2019-06-17 (×3): qty 1

## 2019-06-17 MED ORDER — CHLORHEXIDINE GLUCONATE CLOTH 2 % EX PADS
6.0000 | MEDICATED_PAD | Freq: Every day | CUTANEOUS | Status: DC
  Administered 2019-06-17 – 2019-06-18 (×2): 6 via TOPICAL

## 2019-06-17 NOTE — Op Note (Signed)
NAME: Bradley Irwin, Bradley Irwin MEDICAL RECORD YI:[id number] ACCOUNT [id number] DATE OF BIRTH:June 18, 1946 FACILITY: [facility] LOCATION: [facility] PHYSICIAN:Young Mulvey Maryruth Bun, MD  OPERATIVE REPORT  DATE OF PROCEDURE:  06/16/2019  PREOPERATIVE DIAGNOSIS:  Left upper lobe lung nodule.  POSTOPERATIVE DIAGNOSIS:  Adenocarcinoma of the lung.  PROCEDURES PERFORMED:  Bronchoscopy, left video-assisted thoracoscopy, mini thoracotomy, wedge resection, left upper lobe with lymph node dissection and intercostal nerve block.  SURGEON:  Lanelle Bal, MD  FIRST ASSISTANT:  Nicholes Rough, PA  BRIEF HISTORY:  The patient is a 73 year old male with previous history of renal cell carcinoma who was noted to have a small lung nodule on CT scans for several years.  Recently, he was referred to thoracic surgery.  Further evaluation with PET scan  showed a hypermetabolic lesion less than 2 cm in the posterior segment of the left upper lobe.  There was no other evidence of metastatic renal cell carcinoma on the PET scan.  Surgical resection versus biopsy and potential treatment with radiation was  discussed with the patient.  On initial discussion, the patient was caring for his wife who was to have surgery, and he wished to delay any treatment until this time.  Risks and options were discussed with him, and he was agreeable with proceeding with  surgical resection.  DESCRIPTION OF PROCEDURE:  The patient underwent general endotracheal anesthesia.  A double-lumen endotracheal tube was placed.  Through this, a bronchoscope was passed to the subsegmental level of both right and left tracheobronchial tree.  The  double-lumen endotracheal tube was in good position.  There were no endobronchial lesions noted.  The scope was removed.  The patient was turned in lateral decubitus position with the left side up.  The left side had been preoperatively marked and  correlated with radiographic findings.  The left chest was prepped with  Betadine, draped in a sterile manner.  A second timeout was performed, and we proceeded with a small port incision approximately 4th intercostal space mid axillary line.  The lung  was partially deflated.  The lesion was not readily obvious on the surface of the lung.  The port site was enlarged slightly, and a second port site was placed more inferiorly and anteriorly through this with the videoscope.  Palpating the lung through  the incision, we were able to locate a 2 cm firm area of the lung.  With a 12 port, an Ethicon-powered stapler with a gold load was used to perform a wedge resection of the lesion in the upper lobe.  This was submitted to pathology for frozen section of  lymph nodes from 4L, 10L, 11L.  Sites were removed and appropriately labeled and sent to pathology for permanent section.  The specimen itself was an adenocarcinoma with clear margins.  At this point, we did not do further resection.  A single Blake  chest tube was placed.  A mixture of bupivacaine liposome  was infiltrated with a spinal needle along each intercostal space above and below the incisions and port sites.  The incision was then closed with interrupted 0 Vicryl, running 2-0 Vicryl,  subcutaneous tissue and 3-0 subcuticular stitch in skin edges.  The lung reinflated nicely.  Sponge and needle count was reported as correct at the completion of the procedure.  Blood loss was minimal.  The patient was extubated in the operating room,  transferred to the recovery room for postoperative care.  He tolerated the procedure without obvious complication.  RF scanning reported clear code.  LN/NUANCE  D:06/17/2019 T:06/17/2019 JOB:[id number]

## 2019-06-17 NOTE — Progress Notes (Signed)
      [address]       [place] [address]             [phone]      POD # 1 left upper lobe wedge resection  BP (!) 109/54   Pulse 65   Temp 97.6 F (36.4 C) (Oral)   Resp (!) 27   Ht 5\' 6"  (1.676 m)   Wt 83.9 kg   SpO2 98%   BMI 29.86 kg/m   Intake/Output Summary (Last 24 hours) at 06/17/2019 1839 Last data filed at 06/17/2019 1700 Gross per 24 hour  Intake 1861.27 ml  Output 3235 ml  Net -1373.73 ml   Stable on 2L [place]  Rhianne Soman C. Roxan Hockey, MD [facility] [phone]

## 2019-06-17 NOTE — Progress Notes (Addendum)
      [address]       [place] [address]             [phone]      1 Day Post-Op Procedure(s) (LRB): VIDEO BRONCHOSCOPY (N/A) VIDEO ASSISTED THORACOSCOPY (VATS)/MINI THOROCOTOMY WITH LEFT UPPER LOBE WEDGE RESECTION (Left) Intercostal Nerve Block Lymph Node Dissection Subjective: Feels okay this morning. No issues overnight  Objective: Vital signs in last 24 hours: Temp:  [97.1 F (36.2 C)-99.2 F (37.3 C)] 99.2 F (37.3 C) (06/23 0735) Pulse Rate:  [63-79] 63 (06/23 0700) Cardiac Rhythm: Normal sinus rhythm (06/23 0400) Resp:  [11-21] 12 (06/23 0700) BP: (107-156)/(60-123) 112/67 (06/23 0700) SpO2:  [83 %-100 %] 98 % (06/23 0700) Arterial Line BP: (118-156)/(52-69) 125/55 (06/23 0700)     Intake/Output from previous day: 06/22 0701 - 06/23 0700 In: 3263 [I.V.:2662.9; IV Piggyback:200.1] Out: 3220 [Urine:2980; Blood:50; Chest Tube:190] Intake/Output this shift: No intake/output data recorded.  General appearance: alert, cooperative and no distress Heart: regular rate and rhythm, S1, S2 normal, no murmur, click, rub or gallop Lungs: clear to auscultation bilaterally Abdomen: soft, non-tender; bowel sounds normal; no masses,  no organomegaly Extremities: extremities normal, atraumatic, no cyanosis or edema Wound: clean and dry dressed with a sterile dressing  Lab Results: Recent Labs    06/17/19 0340  WBC 12.0*  HGB 11.7*  11.9*  HCT 35.1*  35.0*  PLT 152   BMET:  Recent Labs    06/17/19 0340  NA 136  137  K 3.2*  3.2*  CL 103  CO2 23  GLUCOSE 174*  BUN 16  CREATININE 1.36*  CALCIUM 8.6*    PT/INR: No results for input(s): LABPROT, INR in the last 72 hours. ABG    Component Value Date/Time   PHART 7.390 06/17/2019 0340   HCO3 23.4 06/17/2019 0340   TCO2 25 06/17/2019 0340   ACIDBASEDEF 1.0 06/17/2019 0340   O2SAT 94.0 06/17/2019 0340   CBG (last 3)  Recent Labs    06/16/19 1918 06/16/19 2321 06/17/19 0331   GLUCAP 215* 188* 172*    Assessment/Plan: S/P Procedure(s) (LRB): VIDEO BRONCHOSCOPY (N/A) VIDEO ASSISTED THORACOSCOPY (VATS)/MINI THOROCOTOMY WITH LEFT UPPER LOBE WEDGE RESECTION (Left) Intercostal Nerve Block Lymph Node Dissection  1. CV-NSR in the 60s. BP stable this morning. ASA and lipitor.  2. Pulm-continue to use incentive spirometer. CXR shows:  Left chest tube in good position but there is some subq air. Chest tube output 190cc/since surgery.  3. Renal-creatinine 1.36, down from yesterday. Replace potassium.  4. H and H 11.7/35.1, expected acute blood loss anemia 5. Endo-blood glucose with moderate control. Add levemir and continue SSI 6. Lovenox for DVT prophylaxis 7. Xanax for anxiety.  Plan: remove foley catheter, arterial line, and decrease IV fluid to rate of 50. OOB to chair. Continue to encourage incentive spirometer use.    LOS: 1 day    Elgie Collard 06/17/2019  I have seen and examined Elige Radon and agree with the above assessment  and plan.  Grace Isaac MD Beeper [phone] Office [phone] 06/17/2019 3:28 PM

## 2019-06-18 ENCOUNTER — Inpatient Hospital Stay (HOSPITAL_COMMUNITY): Payer: Medicare Other

## 2019-06-18 LAB — COMPREHENSIVE METABOLIC PANEL
ALT: 74 U/L — ABNORMAL HIGH (ref 0–44)
AST: 153 U/L — ABNORMAL HIGH (ref 15–41)
Albumin: 3.2 g/dL — ABNORMAL LOW (ref 3.5–5.0)
Alkaline Phosphatase: 127 U/L — ABNORMAL HIGH (ref 38–126)
Anion gap: 7 (ref 5–15)
BUN: 11 mg/dL (ref 8–23)
CO2: 26 mmol/L (ref 22–32)
Calcium: 9 mg/dL (ref 8.9–10.3)
Chloride: 107 mmol/L (ref 98–111)
Creatinine, Ser: 1.23 mg/dL (ref 0.61–1.24)
GFR calc Af Amer: >60 mL/min (ref >60)
GFR calc non Af Amer: 59 mL/min — ABNORMAL LOW (ref >60)
Glucose, Bld: 113 mg/dL — ABNORMAL HIGH (ref 70–99)
Potassium: 3.5 mmol/L (ref 3.5–5.1)
Sodium: 140 mmol/L (ref 135–145)
Total Bilirubin: 1 mg/dL (ref 0.3–1.2)
Total Protein: 5.8 g/dL — ABNORMAL LOW (ref 6.5–8.1)

## 2019-06-18 LAB — CBC
HCT: 37.9 % — ABNORMAL LOW (ref 39.0–52.0)
Hemoglobin: 11.9 g/dL — ABNORMAL LOW (ref 13.0–17.0)
MCH: 29.7 pg (ref 26.0–34.0)
MCHC: 31.4 g/dL (ref 30.0–36.0)
MCV: 94.5 fL (ref 80.0–100.0)
Platelets: 155 10*3/uL (ref 150–400)
RBC: 4.01 MIL/uL — ABNORMAL LOW (ref 4.22–5.81)
RDW: 13.8 % (ref 11.5–15.5)
WBC: 9.2 10*3/uL (ref 4.0–10.5)
nRBC: 0 % (ref 0.0–0.2)

## 2019-06-18 LAB — GLUCOSE, CAPILLARY
Glucose-Capillary: 96 mg/dL (ref 70–99)
Glucose-Capillary: 89 mg/dL (ref 70–99)
Glucose-Capillary: 121 mg/dL — ABNORMAL HIGH (ref 70–99)
Glucose-Capillary: 92 mg/dL (ref 70–99)
Glucose-Capillary: 164 mg/dL — ABNORMAL HIGH (ref 70–99)

## 2019-06-18 MED ORDER — SODIUM CHLORIDE 0.9% FLUSH: 3.0000 mL | INTRAVENOUS | Status: DC | PRN

## 2019-06-18 MED ORDER — SODIUM CHLORIDE 0.9% FLUSH
3.0000 mL | Freq: Two times a day (BID) | INTRAVENOUS | Status: DC
  Administered 2019-06-18 – 2019-06-22 (×6): 3 mL via INTRAVENOUS

## 2019-06-18 MED ORDER — SODIUM CHLORIDE 0.9 % IV SOLN: 250.0000 mL | INTRAVENOUS | Status: DC | PRN

## 2019-06-18 MED ORDER — MOVING RIGHT ALONG BOOK
Freq: Once | Status: AC
  Administered 2019-06-18: 16:00:00
  Filled 2019-06-18: qty 1

## 2019-06-18 MED ORDER — POTASSIUM CHLORIDE 10 MEQ/50ML IV SOLN
10.0000 meq | INTRAVENOUS | Status: AC
  Administered 2019-06-18 (×3): 10 meq via INTRAVENOUS
  Filled 2019-06-18 (×2): qty 50

## 2019-06-18 NOTE — Progress Notes (Addendum)
TCTS DAILY ICU PROGRESS NOTE                   [address]            [place] [address]          [phone]   2 Days Post-Op Procedure(s) (LRB): VIDEO BRONCHOSCOPY (N/A) VIDEO ASSISTED THORACOSCOPY (VATS)/MINI THOROCOTOMY WITH LEFT UPPER LOBE WEDGE RESECTION (Left) Intercostal Nerve Block Lymph Node Dissection  Total Length of Stay:  LOS: 2 days   Subjective: Feels okay today. No issues overnight. Asking when he can go home.   Objective: Vital signs in last 24 hours: Temp:  [97.6 F (36.4 C)-99.2 F (37.3 C)] 97.8 F (36.6 C) (06/23 2327) Pulse Rate:  [57-74] 68 (06/24 0700) Cardiac Rhythm: Normal sinus rhythm (06/24 0400) Resp:  [9-27] 20 (06/24 0700) BP: (101-150)/(48-76) 118/62 (06/24 0700) SpO2:  [92 %-100 %] 98 % (06/24 0700)  Filed Weights   06/16/19 0612 06/16/19 0626  Weight: 85 kg 83.9 kg    Weight change:    Hemodynamic parameters for last 24 hours:    Intake/Output from previous day: 06/23 0701 - 06/24 0700 In: 1401.1 [P.O.:120; I.V.:1230; IV Piggyback:51.1] Out: 2415 [Urine:2125; Chest Tube:290]  Intake/Output this shift: No intake/output data recorded.  Current Meds: Scheduled Meds: . acetaminophen  1,000 mg Oral Q6H   Or  . acetaminophen (TYLENOL) oral liquid 160 mg/5 mL  1,000 mg Oral Q6H  . aspirin EC  81 mg Oral Daily  . atorvastatin  10 mg Oral Daily  . bisacodyl  10 mg Oral Daily  . Chlorhexidine Gluconate Cloth  6 each Topical Daily  . enoxaparin (LOVENOX) injection  30 mg Subcutaneous Daily  . insulin aspart  0-24 Units Subcutaneous Q4H  . insulin detemir  10 Units Subcutaneous Daily  . levothyroxine  125 mcg Oral Once per day on Mon Tue Wed Thu Fri Sat  . [START ON 06/22/2019] levothyroxine  250 mcg Oral Once per day on Sun  . morphine   Intravenous Q4H  . senna-docusate  1 tablet Oral QHS   Continuous Infusions: . dextrose 5 % and 0.45% NaCl 50 mL/hr at 06/18/19 0700  . potassium chloride 50 mL/hr at  06/18/19 0700   PRN Meds:.ALPRAZolam, diphenhydrAMINE **OR** diphenhydrAMINE, morphine injection, naloxone **AND** sodium chloride flush, ondansetron (ZOFRAN) IV, oxyCODONE, traMADol  General appearance: alert, cooperative and no distress Heart: regular rate and rhythm, S1, S2 normal, no murmur, click, rub or gallop Lungs: clear to auscultation bilaterally Abdomen: soft, non-tender; bowel sounds normal; no masses,  no organomegaly Extremities: extremities normal, atraumatic, no cyanosis or edema Wound: clean and dry  Lab Results: CBC: Recent Labs    06/17/19 0340 06/18/19 0358  WBC 12.0* 9.2  HGB 11.7*  11.9* 11.9*  HCT 35.1*  35.0* 37.9*  PLT 152 155   BMET:  Recent Labs    06/17/19 0340 06/18/19 0358  NA 136  137 140  K 3.2*  3.2* 3.5  CL 103 107  CO2 23 26  GLUCOSE 174* 113*  BUN 16 11  CREATININE 1.36* 1.23  CALCIUM 8.6* 9.0    CMET: Lab Results  Component Value Date   WBC 9.2 06/18/2019   HGB 11.9 (L) 06/18/2019   HCT 37.9 (L) 06/18/2019   PLT 155 06/18/2019   GLUCOSE 113 (H) 06/18/2019   ALT 74 (H) 06/18/2019   AST 153 (H) 06/18/2019   NA 140 06/18/2019   K 3.5 06/18/2019   CL  107 06/18/2019   CREATININE 1.23 06/18/2019   BUN 11 06/18/2019   CO2 26 06/18/2019   INR 1.0 06/12/2019      PT/INR: No results for input(s): LABPROT, INR in the last 72 hours. Radiology: No results found.   Assessment/Plan: S/P Procedure(s) (LRB): VIDEO BRONCHOSCOPY (N/A) VIDEO ASSISTED THORACOSCOPY (VATS)/MINI THOROCOTOMY WITH LEFT UPPER LOBE WEDGE RESECTION (Left) Intercostal Nerve Block Lymph Node Dissection  1. CV-NSR in the 60s. BP stable this morning. ASA and lipitor.  2. Pulm-Requiring 3L [place]. Continue to use incentive spirometer.Await CXR. Chest tube output 290cc/since surgery.  3. Renal-creatinine 1.23, down from yesterday. Replace potassium.  4. H and H 11.9/37.9, expected acute blood loss anemia 5. Endo-blood glucose better controlled on SSI and  Levfemir 6. Lovenox for DVT prophylaxis 7. Xanax for anxiety. 8. Frequent urination- chronic. Condom cath in place  Plan:  Turn off IV fluids. Will try solid food this morning. Weaning oxygen requirements as tolerated. Ambulating in the halls. Keep chest tubes one more day due to drainage. Can remove central line once potassium is given.     Bradley Irwin 06/18/2019 7:25 AM   Cancer Staging Lung cancer  left upper lobe ([place]) Staging form: Lung, AJCC 8th Edition - Pathologic stage from 06/17/2019: Stage IA2 (pT1b, pN0, cM0) - Signed by Grace Isaac, MD on 06/17/2019  Discussed stage with the patient  I have seen and examined Bradley Irwin and agree with the above assessment  and plan.  Grace Isaac MD Beeper [phone] Office [phone] 06/18/2019 2:56 PM

## 2019-06-18 NOTE — Progress Notes (Signed)
Pt n/a for CR services.  Yves Dill CES, ACSM 2:50 PM 06/18/2019

## 2019-06-18 NOTE — Progress Notes (Signed)
EVENING ROUNDS NOTE :     [address]       [place] [address]             [phone]                 2 Days Post-Op Procedure(s) (LRB): VIDEO BRONCHOSCOPY (N/A) VIDEO ASSISTED THORACOSCOPY (VATS)/MINI THOROCOTOMY WITH LEFT UPPER LOBE WEDGE RESECTION (Left) Intercostal Nerve Block Lymph Node Dissection  Total Length of Stay:  LOS: 2 days  BP 128/68   Pulse 78   Temp (!) 97.5 F (36.4 C) (Oral)   Resp (!) 26   Ht 5\' 6"  (1.676 m)   Wt 83.9 kg   SpO2 93%   BMI 29.86 kg/m   .Intake/Output      06/24 0701 - 06/25 0700   P.O.    I.V. (mL/kg) 183.9 (2.2)   IV Piggyback 99   Total Intake(mL/kg) 282.8 (3.4)   Urine (mL/kg/hr) 1000 (0.9)   Chest Tube 70   Total Output 1070   Net -787.2         . sodium chloride       Lab Results  Component Value Date   WBC 9.2 06/18/2019   HGB 11.9 (L) 06/18/2019   HCT 37.9 (L) 06/18/2019   PLT 155 06/18/2019   GLUCOSE 113 (H) 06/18/2019   ALT 74 (H) 06/18/2019   AST 153 (H) 06/18/2019   NA 140 06/18/2019   K 3.5 06/18/2019   CL 107 06/18/2019   CREATININE 1.23 06/18/2019   BUN 11 06/18/2019   CO2 26 06/18/2019   INR 1.0 06/12/2019   No new complaints, feels like he is has made some progress today CT output 70 cc today, no air leak Pain controlled Awaiting transfer to [facility] or 4E  Ellwood Handler, PA-C  Office [phone] 06/18/2019 7:54 PM

## 2019-06-19 ENCOUNTER — Inpatient Hospital Stay (HOSPITAL_COMMUNITY): Payer: Medicare Other

## 2019-06-19 LAB — COMPREHENSIVE METABOLIC PANEL
ALT: 104 U/L — ABNORMAL HIGH (ref 0–44)
AST: 122 U/L — ABNORMAL HIGH (ref 15–41)
Albumin: 2.9 g/dL — ABNORMAL LOW (ref 3.5–5.0)
Alkaline Phosphatase: 178 U/L — ABNORMAL HIGH (ref 38–126)
Anion gap: 9 (ref 5–15)
BUN: 12 mg/dL (ref 8–23)
CO2: 25 mmol/L (ref 22–32)
Calcium: 8.8 mg/dL — ABNORMAL LOW (ref 8.9–10.3)
Chloride: 107 mmol/L (ref 98–111)
Creatinine, Ser: 1.26 mg/dL — ABNORMAL HIGH (ref 0.61–1.24)
GFR calc Af Amer: >60 mL/min (ref >60)
GFR calc non Af Amer: 57 mL/min — ABNORMAL LOW (ref >60)
Glucose, Bld: 107 mg/dL — ABNORMAL HIGH (ref 70–99)
Potassium: 3.5 mmol/L (ref 3.5–5.1)
Sodium: 141 mmol/L (ref 135–145)
Total Bilirubin: 0.7 mg/dL (ref 0.3–1.2)
Total Protein: 5.6 g/dL — ABNORMAL LOW (ref 6.5–8.1)

## 2019-06-19 LAB — CBC
HCT: 34.8 % — ABNORMAL LOW (ref 39.0–52.0)
Hemoglobin: 11.4 g/dL — ABNORMAL LOW (ref 13.0–17.0)
MCH: 30.6 pg (ref 26.0–34.0)
MCHC: 32.8 g/dL (ref 30.0–36.0)
MCV: 93.5 fL (ref 80.0–100.0)
Platelets: 121 10*3/uL — ABNORMAL LOW (ref 150–400)
RBC: 3.72 MIL/uL — ABNORMAL LOW (ref 4.22–5.81)
RDW: 13.9 % (ref 11.5–15.5)
WBC: 7.1 10*3/uL (ref 4.0–10.5)
nRBC: 0 % (ref 0.0–0.2)

## 2019-06-19 LAB — GLUCOSE, CAPILLARY
Glucose-Capillary: 101 mg/dL — ABNORMAL HIGH (ref 70–99)
Glucose-Capillary: 107 mg/dL — ABNORMAL HIGH (ref 70–99)
Glucose-Capillary: 109 mg/dL — ABNORMAL HIGH (ref 70–99)
Glucose-Capillary: 113 mg/dL — ABNORMAL HIGH (ref 70–99)
Glucose-Capillary: 85 mg/dL (ref 70–99)
Glucose-Capillary: 87 mg/dL (ref 70–99)
Glucose-Capillary: 97 mg/dL (ref 70–99)

## 2019-06-19 MED ORDER — PANTOPRAZOLE SODIUM 40 MG PO TBEC
40.0000 mg | DELAYED_RELEASE_TABLET | Freq: Every day | ORAL | Status: DC
  Administered 2019-06-19 – 2019-06-23 (×5): 40 mg via ORAL
  Filled 2019-06-19 (×5): qty 1

## 2019-06-19 MED ORDER — POTASSIUM CHLORIDE CRYS ER 10 MEQ PO TBCR
10.0000 meq | EXTENDED_RELEASE_TABLET | Freq: Two times a day (BID) | ORAL | Status: DC
  Administered 2019-06-19 – 2019-06-20 (×4): 10 meq via ORAL
  Filled 2019-06-19 (×6): qty 1

## 2019-06-19 MED ORDER — ALUM & MAG HYDROXIDE-SIMETH 200-200-20 MG/5ML PO SUSP
15.0000 mL | ORAL | Status: DC | PRN
  Administered 2019-06-19 – 2019-06-23 (×4): 15 mL via ORAL
  Filled 2019-06-19 (×4): qty 30

## 2019-06-19 MED ORDER — POTASSIUM CHLORIDE 10 MEQ/100ML IV SOLN
INTRAVENOUS | Status: AC
  Filled 2019-06-19: qty 200

## 2019-06-19 MED ORDER — POTASSIUM CHLORIDE 10 MEQ/100ML IV SOLN
INTRAVENOUS | Status: AC
  Administered 2019-06-19: 10 meq
  Filled 2019-06-19: qty 100

## 2019-06-19 NOTE — Progress Notes (Addendum)
TCTS DAILY ICU PROGRESS NOTE                   [address]            [place] [address]          [phone]   3 Days Post-Op Procedure(s) (LRB): VIDEO BRONCHOSCOPY (N/A) VIDEO ASSISTED THORACOSCOPY (VATS)/MINI THOROCOTOMY WITH LEFT UPPER LOBE WEDGE RESECTION (Left) Intercostal Nerve Block Lymph Node Dissection  Total Length of Stay:  LOS: 3 days   Subjective: Feels okay this morning. No complaints.  Objective: Vital signs in last 24 hours: Temp:  [97.5 F (36.4 C)-99 F (37.2 C)] 99 F (37.2 C) (06/25 0728) Pulse Rate:  [60-89] 89 (06/25 0700) Cardiac Rhythm: Normal sinus rhythm (06/25 0400) Resp:  [11-33] 33 (06/25 0756) BP: (111-163)/(57-84) 146/70 (06/25 0600) SpO2:  [87 %-100 %] 94 % (06/25 0700) Weight:  [84.7 kg] 84.7 kg (06/25 0442)  Filed Weights   06/16/19 0612 06/16/19 0626 06/19/19 0442  Weight: 85 kg 83.9 kg 84.7 kg    Weight change:    Hemodynamic parameters for last 24 hours:    Intake/Output from previous day: 06/24 0701 - 06/25 0700 In: 282.8 [I.V.:183.9; IV Piggyback:99] Out: 2625 [VEHMC:9470; Chest Tube:150]  Intake/Output this shift: No intake/output data recorded.  Current Meds: Scheduled Meds: . acetaminophen  1,000 mg Oral Q6H   Or  . acetaminophen (TYLENOL) oral liquid 160 mg/5 mL  1,000 mg Oral Q6H  . aspirin EC  81 mg Oral Daily  . atorvastatin  10 mg Oral Daily  . bisacodyl  10 mg Oral Daily  . Chlorhexidine Gluconate Cloth  6 each Topical Daily  . enoxaparin (LOVENOX) injection  30 mg Subcutaneous Daily  . insulin aspart  0-24 Units Subcutaneous Q4H  . insulin detemir  10 Units Subcutaneous Daily  . levothyroxine  125 mcg Oral Once per day on Mon Tue Wed Thu Fri Sat  . [START ON 06/22/2019] levothyroxine  250 mcg Oral Once per day on Sun  . morphine   Intravenous Q4H  . senna-docusate  1 tablet Oral QHS  . sodium chloride flush  3 mL Intravenous Q12H   Continuous Infusions: . sodium chloride    .  potassium chloride     PRN Meds:.sodium chloride, ALPRAZolam, diphenhydrAMINE **OR** diphenhydrAMINE, naloxone **AND** sodium chloride flush, ondansetron (ZOFRAN) IV, oxyCODONE, sodium chloride flush, traMADol  General appearance: alert, cooperative and no distress Heart: regular rate and rhythm, S1, S2 normal, no murmur, click, rub or gallop Lungs: clear to auscultation bilaterally and diminished in the lower lobes Abdomen: soft, non-tender; bowel sounds normal; no masses,  no organomegaly Extremities: extremities normal, atraumatic, no cyanosis or edema Wound: clean and dry  Lab Results: CBC: Recent Labs    06/18/19 0358 06/19/19 0249  WBC 9.2 7.1  HGB 11.9* 11.4*  HCT 37.9* 34.8*  PLT 155 121*   BMET:  Recent Labs    06/18/19 0358 06/19/19 0249  NA 140 141  K 3.5 3.5  CL 107 107  CO2 26 25  GLUCOSE 113* 107*  BUN 11 12  CREATININE 1.23 1.26*  CALCIUM 9.0 8.8*    CMET: Lab Results  Component Value Date   WBC 7.1 06/19/2019   HGB 11.4 (L) 06/19/2019   HCT 34.8 (L) 06/19/2019   PLT 121 (L) 06/19/2019   GLUCOSE 107 (H) 06/19/2019   ALT 104 (H) 06/19/2019   AST 122 (H) 06/19/2019   NA 141 06/19/2019  K 3.5 06/19/2019   CL 107 06/19/2019   CREATININE 1.26 (H) 06/19/2019   BUN 12 06/19/2019   CO2 25 06/19/2019   INR 1.0 06/12/2019      PT/INR: No results for input(s): LABPROT, INR in the last 72 hours. Radiology: Dg Chest Port 1 View  Result Date: 06/19/2019 CLINICAL DATA:  Chest tube present. EXAM: PORTABLE CHEST 1 VIEW COMPARISON:  Radiograph of June 18, 2019. FINDINGS: Stable cardiomediastinal silhouette. Stable position of left-sided chest tube with mild left pneumothorax which is not significantly changed compared to prior exam. Stable subcutaneous emphysema is seen bilaterally, left greater than right. Right lung is unremarkable. Bony thorax is unremarkable. IMPRESSION: Stable position of left-sided chest tube with stable mild left pneumothorax. Stable  subcutaneous emphysema is noted. Electronically Signed   By: Marijo Conception M.D.   On: 06/19/2019 07:54     Assessment/Plan: S/P Procedure(s) (LRB): VIDEO BRONCHOSCOPY (N/A) VIDEO ASSISTED THORACOSCOPY (VATS)/MINI THOROCOTOMY WITH LEFT UPPER LOBE WEDGE RESECTION (Left) Intercostal Nerve Block Lymph Node Dissection  1. CV-NSR in the 27s. BP stable this morning. ASA and lipitor.  2. Pulm-Requiring 2L [place]. Continue to use incentive spirometer.Await CXR. Chest tube output 150cc/since surgery. Left pneumothorax on CXR will leave chest tubes one more day.  3. Renal-creatinine 1.26, down from yesterday. Replace potassium.  4. H and H 11.4/34.8, expected acute blood loss anemia 5. Endo-blood glucose better controlled on SSI and Levemir 6. Lovenox for DVT prophylaxis 7. Xanax for anxiety. 8. Frequent urination- chronic. Condom cath in place  Plan: Continue current care plan. Keep chest tubes one more day. No air leak. Ambulate in the halls. Encouraged incentive spirometer hourly. Continue to wean oxygen as tolerated. Transfer to stepdown. Possibly home Saturday.      Bradley Collard 06/19/2019 7:57 AM   I have seen and examined Bradley Irwin and agree with the above assessment  and plan.  Grace Isaac MD Beeper [phone] Office [phone] 06/19/2019 5:51 PM

## 2019-06-19 NOTE — Progress Notes (Signed)
Caitlyn RN witnessed 1 ml waste of morphine from pca pump syringe.

## 2019-06-19 NOTE — Discharge Summary (Addendum)
Physician Discharge Summary  Patient ID: Bradley Irwin MRN: [id number] DOB/AGE: 72/05/1946 72 y.o.  Admit date: 06/16/2019 Discharge date: 06/23/2019  Admission Diagnoses: Patient Active Problem List   Diagnosis Date Noted  . Lung cancer  left upper lobe ([place]) 06/16/2019  . Lipoma of neck 11/28/2013  . Focal nodular hyperplasia of liver 11/24/2011  . Hypertension 05/18/2011  . RENAL CELL CANCER 04/06/2009  . HYPERLIPIDEMIA 04/06/2009  . CAD 04/06/2009  . PVD 04/06/2009  . GERD 04/06/2009  . IRRITABLE BOWEL SYNDROME, HX OF 04/06/2009    Discharge Diagnoses:  Active Problems:   Lung cancer  left upper lobe [facility])   Discharged Condition: good  HPI:   The patient gives a remote history of smoking but quit in  November 1985. In  2017 the patient had a CT scan of the chest in February May and June of that year initially was done for follow-up for his renal carcinoma.  He had no specific pulmonary symptoms at that time.  A left upper lobe lung lesion was present at that time and appeared fairly stable over the time.  A follow-up CT scan of the chest was done in 2018 and again in March 2020. The area in the left upper lobe appeared to enlarge, somewhat multiloculated associated with a cystic area.  Approximate 1.4 cm in size.  Follow-up PET scan was done and showed the area to be hypermetabolic.   Patient has a renal cell carcinoma s/p right nephrectomy-resected by Dr. Lawerance Irwin in January 2008  Patient has a history of CAD, HTN, HLD,Cardiac cath February 2006 with a 2.75 x 28 mm Costar study stent (DES) placed in the mid LAD. Last cath November 2006 with patent mid LAD stent, 40% proximal LAD stenosis, 80% small diagonal branch stenosis, minimal disease in the Circumflex and RCA. Echo 03/18/13 with normal LV function, mild LVH. Nuclear stress test May 2016 with no evidence of ischemia.   Laparoscopic partial hepatectomy 10/2011-benign focal hyperplasia no malignancy of the liver  noted  Is relatively active with out angina and usual daily activities, he notes that if he was to run he would likely have shortness of breath and chest discomfort.  He had  PFTs done yesterday   Family history is significant for father who died of lung cancer in the [facility]:   Mr. Bradley Irwin underwent a mini thoracotomy with left upper lobe wedge resection on 06/16/2019 with Dr. Servando Irwin. He tolerated the procedure well and was transferred to the surgical ICU for continued care. He was extubated in a timely manner. POD1 he was doing well. He was maintaining NSR. His chest tubes were kept in due to output. We started lovenox for DVT prophylaxis. We started his xanax for anxiety. We decreased his IV fluid rate and encouraged him to sit in the chair and use his incentive spirometer. POD 2 we continued to wean his oxygen requirements. He remained on a PCA pump for pain. We continued his chest tubes. We replaced his potassium. His fluids were turned off since he was tolerating oral drink and foods. We removed his central line.  He had a left pneumothorax on CXR POD 3 therefore we kept the chest tubes in one more day. There was no air leak on exam. We started his PPI since he was having reflex symptoms and he was on omeprazole at home. He was stable to transfer to the stepdown unit for continued care. Chest tubes were removed on 06/22/2019 without  issue. His CXR on 06/23/2019 was stable. He is ambulating with limited assistance, tolerating room air, his incisions are healing well and he is ready for discharge.     Consults: cardiac rehab  Significant Diagnostic Studies:  The patient gives a remote history of smoking but quit in  November 1985. In  2017 the patient had a CT scan of the chest in February May and June of that year initially was done for follow-up for his renal carcinoma.  He had no specific pulmonary symptoms at that time.  A left upper lobe lung lesion was present at that  time and appeared fairly stable over the time.  A follow-up CT scan of the chest was done in 2018 and again in March 2020. The area in the left upper lobe appeared to enlarge, somewhat multiloculated associated with a cystic area.  Approximate 1.4 cm in size.  Follow-up PET scan was done and showed the area to be hypermetabolic.   Patient has a renal cell carcinoma s/p right nephrectomy-resected by Dr. Lawerance Irwin in January 2008  Patient has a history of CAD, HTN, HLD,Cardiac cath February 2006 with a 2.75 x 28 mm Costar study stent (DES) placed in the mid LAD. Last cath November 2006 with patent mid LAD stent, 40% proximal LAD stenosis, 80% small diagonal branch stenosis, minimal disease in the Circumflex and RCA. Echo 03/18/13 with normal LV function, mild LVH. Nuclear stress test May 2016 with no evidence of ischemia.   Laparoscopic partial hepatectomy 10/2011-benign focal hyperplasia no malignancy of the liver noted  Is relatively active with out angina and usual daily activities, he notes that if he was to run he would likely have shortness of breath and chest discomfort.  He had  PFTs done yesterday   Family history is significant for father who died of lung cancer in the 1970s   Treatments:   NAME: Bradley Irwin RJ:[id number] ACCOUNT [id number] DATE OF BIRTH:01-06-1946 FACILITY: [facility] LOCATION: [facility] PHYSICIAN:Bradley Maryruth Bun, MD  OPERATIVE REPORT  DATE OF PROCEDURE:  06/16/2019  PREOPERATIVE DIAGNOSIS:  Left upper lobe lung nodule.  POSTOPERATIVE DIAGNOSIS:  Adenocarcinoma of the lung.  PROCEDURES PERFORMED:  Bronchoscopy, left video-assisted thoracoscopy, mini thoracotomy, wedge resection, left upper lobe with lymph node dissection and intercostal nerve block.  SURGEON:  Bradley Bal, MD  FIRST ASSISTANT:  Bradley Rough, PA  BRIEF HISTORY:  The patient is a 73 year old male with previous history of renal cell carcinoma who was noted  to have a small lung nodule on CT scans for several years.  Recently, he was referred to thoracic surgery.  Further evaluation with PET scan  showed a hypermetabolic lesion less than 2 cm in the posterior segment of the left upper lobe.  There was no other evidence of metastatic renal cell carcinoma on the PET scan.  Surgical resection versus biopsy and potential treatment with radiation was  discussed with the patient.  On initial discussion, the patient was caring for his wife who was to have surgery, and he wished to delay any treatment until this time.  Risks and options were discussed with him, and he was agreeable with proceeding with  surgical resection.  Discharge Exam: Blood pressure (!) 141/69, pulse 89, temperature 99.3 F (37.4 C), temperature source Oral, resp. rate (!) 24, height 5\' 6"  (1.676 m), weight 83.3 kg, SpO2 95 %.   General appearance: alert, cooperative and no distress Heart: regular rate and rhythm, S1, S2 normal, no murmur, click, rub or  gallop Lungs: clear to auscultation bilaterally Abdomen: soft, non-tender; bowel sounds normal; no masses,  no organomegaly Extremities: extremities normal, atraumatic, no cyanosis or edema Wound: clean and dry  Disposition: Discharge disposition: 01-Home or Self Care        Allergies as of 06/23/2019      Reactions   Sulfa Antibiotics Other (See Comments)   Happened when he was child. Does not remember reaction.   Demerol Other (See Comments)   Severe headache.   Pseudoephedrine Other (See Comments)   Hypertension      Medication List    STOP taking these medications   atenolol 100 MG tablet Commonly known as: TENORMIN   hydrochlorothiazide 25 MG tablet Commonly known as: HYDRODIURIL   ibuprofen 200 MG tablet Commonly known as: ADVIL   LIBRAX PO   nitroGLYCERIN 0.4 MG SL tablet Commonly known as: NITROSTAT     TAKE these medications   acetaminophen 500 MG tablet Commonly known as: TYLENOL Take 500 mg by  mouth every 6 (six) hours as needed for mild pain, fever or headache.   ALPRAZolam 0.5 MG tablet Commonly known as: XANAX Take 0.5 mg by mouth 2 (two) times daily as needed for anxiety.   amLODipine 5 MG tablet Commonly known as: NORVASC Take 5 mg by mouth at bedtime.   aspirin 81 MG tablet Take 81 mg by mouth daily.   atorvastatin 10 MG tablet Commonly known as: LIPITOR Take 10 mg by mouth daily.   clopidogrel 75 MG tablet Commonly known as: PLAVIX TAKE 1 TABLET BY MOUTH EVERY DAY   diphenhydrAMINE 25 MG tablet Commonly known as: BENADRYL Take 25 mg by mouth at bedtime.   levothyroxine 125 MCG tablet Commonly known as: SYNTHROID Take 125-250 mcg by mouth See admin instructions. Take 125 mcg by mouth Mon-Sat and on Sunday take 250 mcg   omeprazole 20 MG capsule Commonly known as: PRILOSEC Take 20 mg by mouth 2 (two) times daily.   oxyCODONE 5 MG immediate release tablet Commonly known as: Oxy IR/ROXICODONE Take 1 tablet (5 mg total) by mouth every 6 (six) hours as needed for moderate pain.   simethicone 125 MG chewable tablet Commonly known as: MYLICON Chew 865 mg by mouth every 6 (six) hours as needed for flatulence.   valACYclovir 500 MG tablet Commonly known as: VALTREX Take 500 mg by mouth 2 (two) times daily as needed (fever blisters).      Follow-up Information    [name], MD. Call in 1 day(s).   Specialty: Internal Medicine Contact information: [place], [place] [address] [phone]        Burnell Blanks, MD .   Specialty: Cardiology Contact information: [address] [place] [address] [phone]        [facility]  Follow up.   Specialty: Cardiothoracic Surgery Why: Your follow-up appointment is on 07/01/2019 at 2:30p. Please arrive at 2:00pm for a chest xray located at [facility] which is on the first floor of our building.  Contact  information: [place], [place] [phone]          Signed: Elgie Collard 06/23/2019, 9:51 AM

## 2019-06-20 ENCOUNTER — Inpatient Hospital Stay (HOSPITAL_COMMUNITY): Payer: Medicare Other

## 2019-06-20 LAB — GLUCOSE, CAPILLARY
Glucose-Capillary: 103 mg/dL — ABNORMAL HIGH (ref 70–99)
Glucose-Capillary: 97 mg/dL (ref 70–99)
Glucose-Capillary: 131 mg/dL — ABNORMAL HIGH (ref 70–99)
Glucose-Capillary: 132 mg/dL — ABNORMAL HIGH (ref 70–99)
Glucose-Capillary: 118 mg/dL — ABNORMAL HIGH (ref 70–99)
Glucose-Capillary: 141 mg/dL — ABNORMAL HIGH (ref 70–99)

## 2019-06-20 MED ORDER — AMLODIPINE BESYLATE 5 MG PO TABS
5.0000 mg | ORAL_TABLET | Freq: Every day | ORAL | Status: DC
  Administered 2019-06-20 – 2019-06-23 (×4): 5 mg via ORAL
  Filled 2019-06-20 (×4): qty 1

## 2019-06-20 MED ORDER — ORAL CARE MOUTH RINSE
15.0000 mL | Freq: Two times a day (BID) | OROMUCOSAL | Status: DC
  Administered 2019-06-20 – 2019-06-22 (×2): 15 mL via OROMUCOSAL

## 2019-06-20 MED ORDER — PNEUMOCOCCAL VAC POLYVALENT 25 MCG/0.5ML IJ INJ
0.5000 mL | INJECTION | INTRAMUSCULAR | Status: AC | PRN
  Administered 2019-06-23: 0.5 mL via INTRAMUSCULAR
  Filled 2019-06-20: qty 0.5

## 2019-06-20 MED ORDER — ALUM HYDROXIDE-MAG CARBONATE 95-358 MG/15ML PO SUSP
15.0000 mL | Freq: Three times a day (TID) | ORAL | Status: AC
  Administered 2019-06-20 – 2019-06-22 (×7): 15 mL via ORAL
  Filled 2019-06-20 (×8): qty 15

## 2019-06-20 NOTE — Care Management Important Message (Signed)
Important Message  Patient Details  Name: Bradley Irwin MRN: [id number] Date of Birth: 08-01-1946   Medicare Important Message Given:  Yes     Memory Argue 06/20/2019, 3:21 PM

## 2019-06-20 NOTE — Progress Notes (Addendum)
      [address]       [place] [address]             [phone]      4 Days Post-Op Procedure(s) (LRB): VIDEO BRONCHOSCOPY (N/A) VIDEO ASSISTED THORACOSCOPY (VATS)/MINI THOROCOTOMY WITH LEFT UPPER LOBE WEDGE RESECTION (Left) Intercostal Nerve Block Lymph Node Dissection Subjective: Had an episode of acid reflux yesterday but feels better this morning.   Objective: Vital signs in last 24 hours: Temp:  [97.8 F (36.6 C)-99.1 F (37.3 C)] 99.1 F (37.3 C) (06/26 0727) Pulse Rate:  [70-80] 79 (06/26 0727) Cardiac Rhythm: Normal sinus rhythm (06/25 2000) Resp:  [14-21] 19 (06/26 0727) BP: (125-148)/(61-84) 137/73 (06/26 0727) SpO2:  [93 %-97 %] 97 % (06/26 0727) Weight:  [83.7 kg] 83.7 kg (06/26 0541)     Intake/Output from previous day: 06/25 0701 - 06/26 0700 In: 0.8 [I.V.:0.8] Out: 2095 [Urine:1925; Chest Tube:170] Intake/Output this shift: No intake/output data recorded.  General appearance: alert, cooperative and no distress Heart: regular rate and rhythm, S1, S2 normal, no murmur, click, rub or gallop Lungs: clear to auscultation bilaterally Abdomen: soft, non-tender; bowel sounds normal; no masses,  no organomegaly Extremities: extremities normal, atraumatic, no cyanosis or edema Wound: clean and dry  Lab Results: Recent Labs    06/18/19 0358 06/19/19 0249  WBC 9.2 7.1  HGB 11.9* 11.4*  HCT 37.9* 34.8*  PLT 155 121*   BMET:  Recent Labs    06/18/19 0358 06/19/19 0249  NA 140 141  K 3.5 3.5  CL 107 107  CO2 26 25  GLUCOSE 113* 107*  BUN 11 12  CREATININE 1.23 1.26*  CALCIUM 9.0 8.8*    PT/INR: No results for input(s): LABPROT, INR in the last 72 hours. ABG    Component Value Date/Time   PHART 7.390 06/17/2019 0340   HCO3 23.4 06/17/2019 0340   TCO2 25 06/17/2019 0340   ACIDBASEDEF 1.0 06/17/2019 0340   O2SAT 94.0 06/17/2019 0340   CBG (last 3)  Recent Labs    06/19/19 2329 06/20/19 0259 06/20/19 0730  GLUCAP  109* 103* 97    Assessment/Plan: S/P Procedure(s) (LRB): VIDEO BRONCHOSCOPY (N/A) VIDEO ASSISTED THORACOSCOPY (VATS)/MINI THOROCOTOMY WITH LEFT UPPER LOBE WEDGE RESECTION (Left) Intercostal Nerve Block Lymph Node Dissection   1. CV-NSR in the 29s. BP stable this morning. ASA and lipitor.  2. Pulm-Requiring 2L [place]. Continue to use incentive spirometer.Await CXR.Chest tube output170cc/since surgery. Small air leak with chest tube on water seal 3. Renal-creatinine 1.26, down from yesterday. Replace potassium.  4. H and H 11.4/34.8, expected acute blood loss anemia 5. Endo-blood glucosebetter controlled on SSI and Levemir 6. Lovenox for DVT prophylaxis 7. Xanax for anxiety. 8. Frequent urination- chronic. Condom cath in place  Plan: Await PA/LAT film to make decision about chest tube. Small intermittent air leak on water seal. Continue ambulation. Continue Protonix for reflux. Making good progress. Will add back home medication Norvasc 5mg .    LOS: 4 days    Elgie Collard 06/20/2019  Nausea and trouble swallowing  On Protonix Will add gavicon Small air leak leave chest tube today  I have seen and examined Bradley Irwin and agree with the above assessment  and plan.  Grace Isaac MD Beeper [phone] Office [phone] 06/20/2019 11:54 AM

## 2019-06-21 ENCOUNTER — Inpatient Hospital Stay (HOSPITAL_COMMUNITY): Payer: Medicare Other

## 2019-06-21 LAB — GLUCOSE, CAPILLARY
Glucose-Capillary: 108 mg/dL — ABNORMAL HIGH (ref 70–99)
Glucose-Capillary: 115 mg/dL — ABNORMAL HIGH (ref 70–99)
Glucose-Capillary: 118 mg/dL — ABNORMAL HIGH (ref 70–99)
Glucose-Capillary: 99 mg/dL (ref 70–99)
Glucose-Capillary: 146 mg/dL — ABNORMAL HIGH (ref 70–99)

## 2019-06-21 NOTE — Progress Notes (Addendum)
5 Days Post-Op Procedure(s) (LRB): VIDEO BRONCHOSCOPY (N/A) VIDEO ASSISTED THORACOSCOPY (VATS)/MINI THOROCOTOMY WITH LEFT UPPER LOBE WEDGE RESECTION (Left) Intercostal Nerve Block Lymph Node Dissection Subjective: Says he is doing well, no new problems. Says he used the PCA only twice today but would like to keep it until the CT is removed.   Objective: Vital signs in last 24 hours: Temp:  [97.6 F (36.4 C)-99.3 F (37.4 C)] 98.5 F (36.9 C) (06/27 1054) Pulse Rate:  [69-93] 70 (06/27 1054) Cardiac Rhythm: Normal sinus rhythm (06/27 0700) Resp:  [15-23] 23 (06/27 1054) BP: (134-153)/(61-80) 141/66 (06/27 1054) SpO2:  [94 %-98 %] 97 % (06/27 1054) Weight:  [83.3 kg] 83.3 kg (06/27 0500)  Hemodynamic parameters for last 24 hours: Intake/Output from previous day: 06/26 0701 - 06/27 0700 In: 360 [P.O.:360] Out: 2085 [Urine:2025; Chest Tube:60] Intake/Output this shift: Total I/O In: 240 [P.O.:240] Out: 100 [Urine:100]  General appearance: alert, cooperative and no distress Heart: regular rate and rhythm Lungs: breath sounds clear posterior. CXR reviewed: less than 5% left lateral PTX. Small air leak with cough today. CT on water seal. Abdomen: soft, non-tender; bowel sounds normal Wound: clean and dry  Lab Results: Recent Labs    06/19/19 0249  WBC 7.1  HGB 11.4*  HCT 34.8*  PLT 121*   BMET:  Recent Labs    06/19/19 0249  NA 141  K 3.5  CL 107  CO2 25  GLUCOSE 107*  BUN 12  CREATININE 1.26*  CALCIUM 8.8*    PT/INR: No results for input(s): LABPROT, INR in the last 72 hours. ABG    Component Value Date/Time   PHART 7.390 06/17/2019 0340   HCO3 23.4 06/17/2019 0340   TCO2 25 06/17/2019 0340   ACIDBASEDEF 1.0 06/17/2019 0340   O2SAT 94.0 06/17/2019 0340   CBG (last 3)  Recent Labs    06/21/19 0430 06/21/19 0805 06/21/19 1114  GLUCAP 108* 115* 118*    Assessment/Plan: S/P Procedure(s) (LRB): VIDEO BRONCHOSCOPY (N/A) VIDEO ASSISTED THORACOSCOPY  (VATS)/MINI THOROCOTOMY WITH LEFT UPPER LOBE WEDGE RESECTION (Left) Intercostal Nerve Block Lymph Node Dissection  -POD5 left VATS, wedge resection of LUL lobe nodule for stage 1A2 adenocarcinoma.  Has small air leak and a tiny PTX, will leave the CT to water seal another day.  Plan to d/c PCA tomorrow.   -History of renal cell carcinoma  -DVT PPX-lovenox    LOS: 5 days    Antony Odea, PA-C [phone] 06/21/2019  Continue chest tube until air leak resolves Patient otherwise doing well patient examined and medical record reviewed,agree with above note. Tharon Aquas Trigt III 06/21/2019

## 2019-06-21 NOTE — Plan of Care (Signed)

## 2019-06-22 ENCOUNTER — Inpatient Hospital Stay (HOSPITAL_COMMUNITY): Payer: Medicare Other

## 2019-06-22 LAB — GLUCOSE, CAPILLARY
Glucose-Capillary: 105 mg/dL — ABNORMAL HIGH (ref 70–99)
Glucose-Capillary: 123 mg/dL — ABNORMAL HIGH (ref 70–99)
Glucose-Capillary: 124 mg/dL — ABNORMAL HIGH (ref 70–99)
Glucose-Capillary: 131 mg/dL — ABNORMAL HIGH (ref 70–99)
Glucose-Capillary: 98 mg/dL (ref 70–99)

## 2019-06-22 NOTE — Progress Notes (Addendum)
6 Days Post-Op Procedure(s) (LRB): VIDEO BRONCHOSCOPY (N/A) VIDEO ASSISTED THORACOSCOPY (VATS)/MINI THOROCOTOMY WITH LEFT UPPER LOBE WEDGE RESECTION (Left) Intercostal Nerve Block Lymph Node Dissection Subjective: Doing well, no new problems, pain well controlled.   Objective: Vital signs in last 24 hours: Temp:  [97.3 F (36.3 C)-99.2 F (37.3 C)] 99.2 F (37.3 C) (06/28 0733) Pulse Rate:  [70-91] 91 (06/28 0733) Cardiac Rhythm: Normal sinus rhythm (06/28 0700) Resp:  [18-26] 21 (06/28 0733) BP: (120-152)/(56-85) 152/85 (06/28 1033) SpO2:  [96 %-100 %] 97 % (06/28 0733) Weight:  [85.3 kg] 85.3 kg (06/28 0406)    Intake/Output from previous day: 06/27 0701 - 06/28 0700 In: 720 [P.O.:720] Out: 2280 [Urine:2150; Chest Tube:130] Intake/Output this shift: Total I/O In: 240 [P.O.:240] Out: 100 [Urine:100]  General appearance:alert, cooperative and no distress Heart:regular rate and rhythm Lungs:breath sounds clear posterior. CXR reviewed: less than 5% left lateral PTX. No air leak, subQ air is abating.  CXR reviewed-no change in tiny lateral PTX on left.  Wound:clean and dry  Lab Results: No results for input(s): WBC, HGB, HCT, PLT in the last 72 hours. BMET: No results for input(s): NA, K, CL, CO2, GLUCOSE, BUN, CREATININE, CALCIUM in the last 72 hours.  PT/INR: No results for input(s): LABPROT, INR in the last 72 hours. ABG    Component Value Date/Time   PHART 7.390 06/17/2019 0340   HCO3 23.4 06/17/2019 0340   TCO2 25 06/17/2019 0340   ACIDBASEDEF 1.0 06/17/2019 0340   O2SAT 94.0 06/17/2019 0340   CBG (last 3)  Recent Labs    06/21/19 1946 06/21/19 2356 06/22/19 0407  GLUCAP 146* 124* 98    Assessment/Plan: S/P Procedure(s) (LRB): VIDEO BRONCHOSCOPY (N/A) VIDEO ASSISTED THORACOSCOPY (VATS)/MINI THOROCOTOMY WITH LEFT UPPER LOBE WEDGE RESECTION (Left) Intercostal Nerve Block Lymph Node Dissection  -POD5 left VATS, wedge resection of LUL lobe nodule for  stage 1A adenocarcinoma.  The air leak  has resolved and the tiny lateral PTX is unchanged. Will  d/c the chest tube today and also d/c the PCA.  Possible discharge in AM if CXR stable.   -History of renal cell carcinoma  -DVT PPX-lovenox   LOS: 6 days    Antony Odea, PA-C [phone] 06/22/2019  Airleak resolved Chest tube to be removed today Prepare for discharge in a.m. patient examined and medical record reviewed,agree with above note. Tharon Aquas Trigt III 06/22/2019

## 2019-06-23 ENCOUNTER — Inpatient Hospital Stay (HOSPITAL_COMMUNITY): Payer: Medicare Other

## 2019-06-23 LAB — CBC
HCT: 39.7 % (ref 39.0–52.0)
Hemoglobin: 12.8 g/dL — ABNORMAL LOW (ref 13.0–17.0)
MCH: 30 pg (ref 26.0–34.0)
MCHC: 32.2 g/dL (ref 30.0–36.0)
MCV: 93.2 fL (ref 80.0–100.0)
Platelets: 217 10*3/uL (ref 150–400)
RBC: 4.26 MIL/uL (ref 4.22–5.81)
RDW: 14.4 % (ref 11.5–15.5)
WBC: 9.3 10*3/uL (ref 4.0–10.5)
nRBC: 0 % (ref 0.0–0.2)

## 2019-06-23 LAB — BASIC METABOLIC PANEL
Anion gap: 9 (ref 5–15)
BUN: 9 mg/dL (ref 8–23)
CO2: 26 mmol/L (ref 22–32)
Calcium: 9.9 mg/dL (ref 8.9–10.3)
Chloride: 105 mmol/L (ref 98–111)
Creatinine, Ser: 1.24 mg/dL (ref 0.61–1.24)
GFR calc Af Amer: >60 mL/min (ref >60)
GFR calc non Af Amer: 58 mL/min — ABNORMAL LOW (ref >60)
Glucose, Bld: 108 mg/dL — ABNORMAL HIGH (ref 70–99)
Potassium: 3.7 mmol/L (ref 3.5–5.1)
Sodium: 140 mmol/L (ref 135–145)

## 2019-06-23 LAB — GLUCOSE, CAPILLARY: Glucose-Capillary: 141 mg/dL — ABNORMAL HIGH (ref 70–99)

## 2019-06-23 MED ORDER — OXYCODONE HCL 5 MG PO TABS: 5.0000 mg | ORAL_TABLET | Freq: Four times a day (QID) | ORAL | 0 refills | Status: DC | PRN

## 2019-06-23 NOTE — Progress Notes (Signed)
IV discontinues, discharge instructions reviewed, patient verbalizes understanding.  Questions answered, waiting for wife to arrive to pick him up

## 2019-06-23 NOTE — Discharge Instructions (Signed)

## 2019-06-23 NOTE — Progress Notes (Signed)
      [address]       [place] [address]             [phone]      7 Days Post-Op Procedure(s) (LRB): VIDEO BRONCHOSCOPY (N/A) VIDEO ASSISTED THORACOSCOPY (VATS)/MINI THOROCOTOMY WITH LEFT UPPER LOBE WEDGE RESECTION (Left) Intercostal Nerve Block Lymph Node Dissection Subjective: Feels good this morning. Slept well yesterday and last night since the chest tube was removed.   Objective: Vital signs in last 24 hours: Temp:  [98.1 F (36.7 C)-99.3 F (37.4 C)] 99.3 F (37.4 C) (06/29 0725) Pulse Rate:  [72-89] 89 (06/29 0725) Cardiac Rhythm: Normal sinus rhythm (06/28 2000) Resp:  [17-25] 24 (06/29 0725) BP: (121-154)/(63-85) 141/69 (06/29 0725) SpO2:  [95 %-99 %] 95 % (06/29 0725) Weight:  [83.3 kg] 83.3 kg (06/29 0500)     Intake/Output from previous day: 06/28 0701 - 06/29 0700 In: 520 [P.O.:520] Out: 250 [Urine:250] Intake/Output this shift: Total I/O In: 240 [P.O.:240] Out: 450 [Urine:450]  General appearance: alert, cooperative and no distress Heart: regular rate and rhythm, S1, S2 normal, no murmur, click, rub or gallop Lungs: clear to auscultation bilaterally Abdomen: soft, non-tender; bowel sounds normal; no masses,  no organomegaly Extremities: extremities normal, atraumatic, no cyanosis or edema Wound: clean and dry  Lab Results: Recent Labs    06/23/19 0659  WBC 9.3  HGB 12.8*  HCT 39.7  PLT 217   BMET: No results for input(s): NA, K, CL, CO2, GLUCOSE, BUN, CREATININE, CALCIUM in the last 72 hours.  PT/INR: No results for input(s): LABPROT, INR in the last 72 hours. ABG    Component Value Date/Time   PHART 7.390 06/17/2019 0340   HCO3 23.4 06/17/2019 0340   TCO2 25 06/17/2019 0340   ACIDBASEDEF 1.0 06/17/2019 0340   O2SAT 94.0 06/17/2019 0340   CBG (last 3)  Recent Labs    06/22/19 1122 06/22/19 1615 06/22/19 2001  GLUCAP 105* 123* 131*    Assessment/Plan: S/P Procedure(s) (LRB): VIDEO BRONCHOSCOPY (N/A)  VIDEO ASSISTED THORACOSCOPY (VATS)/MINI THOROCOTOMY WITH LEFT UPPER LOBE WEDGE RESECTION (Left) Intercostal Nerve Block Lymph Node Dissection  POD 6 left VATS, wedge resection of LUL lobe nodule for stage 1A adenocarcinoma. Chest tube removed yesterday without issue. He is now off the PCA. CXR this morning looks stable and I am unable to see the previous pneumothorax on yesterdays study. We will plan for discharge today. All questions answered.    LOS: 7 days    Elgie Collard 06/23/2019

## 2019-06-24 ENCOUNTER — Telehealth: Payer: Self-pay | Admitting: Cardiovascular Disease

## 2019-06-24 NOTE — Telephone Encounter (Signed)
Patient was discharged from hospital 6/29, he said he doesn't he require any chemo or radiation based on what was found.  If anything needs to be scheduled please reach out to him.

## 2019-06-24 NOTE — Telephone Encounter (Signed)
Attempted to call the pt back and no answer only a busy tone.

## 2019-06-25 NOTE — Telephone Encounter (Signed)
Left message to call back  

## 2019-06-25 NOTE — Telephone Encounter (Signed)
I spoke with pt and told him Dr. Angelena Form would like to see him in October.  Pt will call back in a couple of weeks to see if this schedule is available.

## 2019-06-26 ENCOUNTER — Other Ambulatory Visit: Payer: Self-pay | Admitting: *Deleted

## 2019-06-26 NOTE — Progress Notes (Signed)
The proposed treatment discussed in cancer conference 06/26/2019 is for discussion purpose only and is not a binding recommendation.  The patient was not seen nor present for their treatment options.  Therefore, final treatment plans cannot be decided.

## 2019-06-30 ENCOUNTER — Encounter (HOSPITAL_COMMUNITY): Payer: Self-pay | Admitting: Cardiothoracic Surgery

## 2019-06-30 ENCOUNTER — Encounter (INDEPENDENT_AMBULATORY_CARE_PROVIDER_SITE_OTHER): Payer: Self-pay

## 2019-06-30 ENCOUNTER — Ambulatory Visit
Admission: RE | Admit: 2019-06-30 | Discharge: 2019-06-30 | Disposition: A | Discharge status: Home / Self Care | Payer: Medicare Other | Source: Ambulatory Visit | Attending: Cardiothoracic Surgery | Admitting: Cardiothoracic Surgery

## 2019-06-30 ENCOUNTER — Other Ambulatory Visit: Payer: Self-pay | Admitting: Cardiothoracic Surgery

## 2019-06-30 ENCOUNTER — Other Ambulatory Visit: Payer: Self-pay

## 2019-06-30 DIAGNOSIS — C3490 Malignant neoplasm of unspecified part of unspecified bronchus or lung: Secondary | ICD-10-CM

## 2019-06-30 DIAGNOSIS — Z4802 Encounter for removal of sutures: Secondary | ICD-10-CM

## 2019-07-01 ENCOUNTER — Ambulatory Visit: Payer: Medicare Other

## 2019-07-06 ENCOUNTER — Other Ambulatory Visit: Payer: Self-pay | Admitting: Cardiovascular Disease

## 2019-07-08 ENCOUNTER — Encounter (HOSPITAL_COMMUNITY): Payer: Self-pay | Admitting: Cardiothoracic Surgery

## 2019-07-09 ENCOUNTER — Other Ambulatory Visit: Payer: Self-pay

## 2019-07-09 DIAGNOSIS — Z09 Encounter for follow-up examination after completed treatment for conditions other than malignant neoplasm: Secondary | ICD-10-CM

## 2019-07-10 ENCOUNTER — Other Ambulatory Visit: Payer: Self-pay

## 2019-07-10 ENCOUNTER — Ambulatory Visit
Admission: RE | Admit: 2019-07-10 | Discharge: 2019-07-10 | Disposition: A | Discharge status: Home / Self Care | Payer: Medicare Other | Source: Ambulatory Visit | Attending: Cardiothoracic Surgery | Admitting: Cardiothoracic Surgery

## 2019-07-10 ENCOUNTER — Encounter: Payer: Self-pay | Admitting: Cardiothoracic Surgery

## 2019-07-10 ENCOUNTER — Ambulatory Visit (INDEPENDENT_AMBULATORY_CARE_PROVIDER_SITE_OTHER): Payer: Self-pay | Admitting: Cardiothoracic Surgery

## 2019-07-10 VITALS — BP 156/79 | HR 103 | Temp 97.7°F | Resp 18 | Ht 66.0 in | Wt 184.5 lb

## 2019-07-10 DIAGNOSIS — C3490 Malignant neoplasm of unspecified part of unspecified bronchus or lung: Secondary | ICD-10-CM

## 2019-07-10 DIAGNOSIS — Z09 Encounter for follow-up examination after completed treatment for conditions other than malignant neoplasm: Secondary | ICD-10-CM

## 2019-07-10 NOTE — Progress Notes (Signed)
[address]       [place] [address]             [phone]                  [place] D Meggett [place] Medical Record #[id number] Date of Birth: 1946-02-10  Referring ON:GEXBM, Christean Grief, MD Primary Cardiology: Primary Care:Green, Christean Grief, MD  Chief Complaint:  Follow Up Visit OPERATIVE REPORT DATE OF PROCEDURE:  06/16/2019 PREOPERATIVE DIAGNOSIS:  Left upper lobe lung nodule. POSTOPERATIVE DIAGNOSIS:  Adenocarcinoma of the lung. PROCEDURES PERFORMED:  Bronchoscopy, left video-assisted thoracoscopy, mini thoracotomy, wedge resection, left upper lobe with lymph node dissection and intercostal nerve block.  Cancer Staging Lung cancer  left upper lobe [facility]) Staging form: Lung, AJCC 8th Edition - Pathologic stage from 06/17/2019: Stage IA2 (pT1b, pN0, cM0) - Signed by Grace Isaac, MD on 06/17/2019   History of Present Illness:     Patient is doing well since discharge after resection of his stage I a wedge resection of left upper lobe and lymph node dissection, about 3 weeks ago.  He is increase his activity appropriately.  Is not needing pain medication.  He is increasing his activity and walking well.        Zubrod Score: At the time of surgery this patient's most appropriate activity status/level should be described as: []     0    Normal activity, no symptoms [x]     1    Restricted in physical strenuous activity but ambulatory, able to do out light work []     2    Ambulatory and capable of self care, unable to do work activities, up and about                 >50 % of waking hours                                                                                   []     3    Only limited self care, in bed greater than 50% of waking hours []     4    Completely disabled, no self care, confined to bed or chair []     5    Moribund  Social History   Tobacco Use  Smoking Status Former Smoker  . Packs/day: 1.00  . Years: 15.00  . Pack years:  15.00  . Types: Cigarettes  . Quit date: 10/26/1983  . Years since quitting: 35.7  Smokeless Tobacco Never Used       Allergies  Allergen Reactions  . Sulfa Antibiotics Other (See Comments)    Happened when he was child. Does not remember reaction.  . Demerol Other (See Comments)    Severe headache.  . Pseudoephedrine Other (See Comments)    Hypertension     Current Outpatient Medications  Medication Sig Dispense Refill  . acetaminophen (TYLENOL) 500 MG tablet Take 500 mg by mouth every 6 (six) hours as needed for mild pain, fever or headache.     . ALPRAZolam (XANAX) 0.5 MG tablet Take 0.5 mg by mouth 2 (two) times daily as needed for anxiety.     Marland Kitchen  amLODipine (NORVASC) 5 MG tablet Take 5 mg by mouth at bedtime.     Marland Kitchen aspirin 81 MG tablet Take 81 mg by mouth daily.      Marland Kitchen atorvastatin (LIPITOR) 10 MG tablet Take 10 mg by mouth daily.      . clopidogrel (PLAVIX) 75 MG tablet TAKE 1 TABLET BY MOUTH EVERY DAY 90 tablet 3  . diphenhydrAMINE (BENADRYL) 25 MG tablet Take 25 mg by mouth at bedtime.     Marland Kitchen levothyroxine (SYNTHROID) 125 MCG tablet Take 125-250 mcg by mouth See admin instructions. Take 125 mcg by mouth Mon-Sat and on Sunday take 250 mcg  3  . omeprazole (PRILOSEC) 20 MG capsule Take 20 mg by mouth 2 (two) times daily.     Marland Kitchen oxyCODONE (OXY IR/ROXICODONE) 5 MG immediate release tablet Take 1 tablet (5 mg total) by mouth every 6 (six) hours as needed for moderate pain. 30 tablet 0  . simethicone (MYLICON) 371 MG chewable tablet Chew 125 mg by mouth every 6 (six) hours as needed for flatulence.    . valACYclovir (VALTREX) 500 MG tablet Take 500 mg by mouth 2 (two) times daily as needed (fever blisters).   0  . atenolol (TENORMIN) 100 MG tablet     . hydrochlorothiazide (HYDRODIURIL) 25 MG tablet      No current facility-administered medications for this visit.        Physical Exam: BP (!) 156/79 (BP Location: Right Arm, Patient Position: Sitting, Cuff Size: Normal)    Pulse (!) 103   Temp 97.7 F (36.5 C)   Resp 18   Ht 5\' 6"  (1.676 m)   Wt 184 lb 8 oz (83.7 kg)   SpO2 95% Comment: RA  BMI 29.78 kg/m   General appearance: alert and cooperative Neurologic: intact Heart: regular rate and rhythm, S1, S2 normal, no murmur, click, rub or gallop Lungs: clear to auscultation bilaterally Abdomen: soft, non-tender; bowel sounds normal; no masses,  no organomegaly Extremities: extremities normal, atraumatic, no cyanosis or edema and Homans sign is negative, no sign of DVT Wound: Chest tube/port sites left chest are healing well without evidence of infection  Diagnostic Studies & Laboratory data:         Recent Radiology Findings: Dg Chest 2 View  Result Date: 07/10/2019 CLINICAL DATA:  Post left vats 06/16/2019 EXAM: CHEST - 2 VIEW COMPARISON:  06/30/2019 FINDINGS: Postoperative changes in the left upper lobe. Linear areas of scarring in the left lung. Right lung clear. Heart is normal size. No effusions or acute bony abnormality. No pneumothorax. IMPRESSION: Postoperative changes in the left upper lobe. Areas of scarring in the left lung. No active disease. Electronically Signed   By: Rolm Baptise M.D.   On: 07/10/2019 15:08    I have independently reviewed the above radiology findings and reviewed findings  with the patient.  Recent Labs: Lab Results  Component Value Date   WBC 9.3 06/23/2019   HGB 12.8 (L) 06/23/2019   HCT 39.7 06/23/2019   PLT 217 06/23/2019   GLUCOSE 108 (H) 06/23/2019   ALT 104 (H) 06/19/2019   AST 122 (H) 06/19/2019   NA 140 06/23/2019   K 3.7 06/23/2019   CL 105 06/23/2019   CREATININE 1.24 06/23/2019   BUN 9 06/23/2019   CO2 26 06/23/2019   INR 1.0 06/12/2019      Assessment / Plan:   Patient stable early postop after recent lung resection.  Wounds are healing well and is increasing his  physical activity appropriately.  He will be seen in the multidisciplinary thoracic oncology clinic, as it is PDL 1 was  significantly elevated, increasing his overall risk of recurrence.  Plan to see him back in 2 months with a follow-up chest x-ray.  Medication Changes: No orders of the defined types were placed in this encounter.   Grace Isaac 07/14/2019 4:31 PM

## 2019-07-11 ENCOUNTER — Telehealth: Payer: Self-pay | Admitting: *Deleted

## 2019-07-11 DIAGNOSIS — C3490 Malignant neoplasm of unspecified part of unspecified bronchus or lung: Secondary | ICD-10-CM

## 2019-07-11 NOTE — Telephone Encounter (Signed)
Oncology Nurse Navigator Documentation  Oncology Nurse Navigator Flowsheets 07/11/2019  Navigator Location [facility]  Referral Date to RadOnc/MedOnc 07/10/2019/I received referral yesterday from Dr. Servando Snare.  I called patient today and scheduled him to be seen with Dr. Julien Nordmann on 07/29/2019.  Patient verbalized understanding of appt time and place.   Navigator Encounter Type Telephone  Telephone Outgoing Call  Abnormal Finding Date 03/05/2019  Confirmed Diagnosis Date 06/16/2019  Treatment Phase Other  Barriers/Navigation Needs Coordination of Care;Education  Education Other  Interventions Coordination of Care;Education  Coordination of Care Appts  Education Method Verbal  Acuity Level 2  Time Spent with Patient 15

## 2019-07-18 ENCOUNTER — Telehealth: Payer: Self-pay | Admitting: *Deleted

## 2019-07-21 ENCOUNTER — Telehealth: Payer: Self-pay | Admitting: Internal Medicine

## 2019-07-21 NOTE — Telephone Encounter (Signed)
Confirmed 8/4 lab/new patient visit with MM at 1:45 pm.

## 2019-07-29 ENCOUNTER — Inpatient Hospital Stay: Payer: Medicare Other | Admitting: Internal Medicine

## 2019-07-29 ENCOUNTER — Encounter: Payer: Self-pay | Admitting: Internal Medicine

## 2019-07-29 ENCOUNTER — Other Ambulatory Visit: Payer: Self-pay

## 2019-07-29 ENCOUNTER — Inpatient Hospital Stay: Payer: Medicare Other | Attending: Internal Medicine

## 2019-07-29 VITALS — BP 131/57 | HR 72 | Temp 98.3°F | Resp 18 | Ht 66.0 in | Wt 185.2 lb

## 2019-07-29 DIAGNOSIS — C3412 Malignant neoplasm of upper lobe, left bronchus or lung: Secondary | ICD-10-CM | POA: Diagnosis not present

## 2019-07-29 DIAGNOSIS — Z7289 Other problems related to lifestyle: Secondary | ICD-10-CM | POA: Diagnosis not present

## 2019-07-29 DIAGNOSIS — Z955 Presence of coronary angioplasty implant and graft: Secondary | ICD-10-CM | POA: Diagnosis not present

## 2019-07-29 DIAGNOSIS — Z885 Allergy status to narcotic agent status: Secondary | ICD-10-CM

## 2019-07-29 DIAGNOSIS — L409 Psoriasis, unspecified: Secondary | ICD-10-CM | POA: Insufficient documentation

## 2019-07-29 DIAGNOSIS — C3490 Malignant neoplasm of unspecified part of unspecified bronchus or lung: Secondary | ICD-10-CM

## 2019-07-29 DIAGNOSIS — I2510 Atherosclerotic heart disease of native coronary artery without angina pectoris: Secondary | ICD-10-CM

## 2019-07-29 DIAGNOSIS — Z8249 Family history of ischemic heart disease and other diseases of the circulatory system: Secondary | ICD-10-CM | POA: Insufficient documentation

## 2019-07-29 DIAGNOSIS — Z882 Allergy status to sulfonamides status: Secondary | ICD-10-CM

## 2019-07-29 DIAGNOSIS — C641 Malignant neoplasm of right kidney, except renal pelvis: Secondary | ICD-10-CM | POA: Diagnosis present

## 2019-07-29 DIAGNOSIS — Z801 Family history of malignant neoplasm of trachea, bronchus and lung: Secondary | ICD-10-CM | POA: Diagnosis not present

## 2019-07-29 DIAGNOSIS — Z79899 Other long term (current) drug therapy: Secondary | ICD-10-CM

## 2019-07-29 DIAGNOSIS — Z905 Acquired absence of kidney: Secondary | ICD-10-CM | POA: Insufficient documentation

## 2019-07-29 DIAGNOSIS — I110 Hypertensive heart disease with heart failure: Secondary | ICD-10-CM | POA: Diagnosis not present

## 2019-07-29 DIAGNOSIS — K589 Irritable bowel syndrome without diarrhea: Secondary | ICD-10-CM | POA: Insufficient documentation

## 2019-07-29 DIAGNOSIS — Z87891 Personal history of nicotine dependence: Secondary | ICD-10-CM

## 2019-07-29 DIAGNOSIS — C3492 Malignant neoplasm of unspecified part of left bronchus or lung: Secondary | ICD-10-CM

## 2019-07-29 LAB — CBC WITH DIFFERENTIAL (CANCER CENTER ONLY)
Abs Immature Granulocytes: 0.03 10*3/uL (ref 0.00–0.07)
Basophils Absolute: 0 10*3/uL (ref 0.0–0.1)
Basophils Relative: 0 %
Eosinophils Absolute: 0.3 10*3/uL (ref 0.0–0.5)
Eosinophils Relative: 4 %
HCT: 38.2 % — ABNORMAL LOW (ref 39.0–52.0)
Hemoglobin: 12.1 g/dL — ABNORMAL LOW (ref 13.0–17.0)
Immature Granulocytes: 0 %
Lymphocytes Relative: 42 %
Lymphs Abs: 3.5 10*3/uL (ref 0.7–4.0)
MCH: 28.8 pg (ref 26.0–34.0)
MCHC: 31.7 g/dL (ref 30.0–36.0)
MCV: 91 fL (ref 80.0–100.0)
Monocytes Absolute: 0.9 10*3/uL (ref 0.1–1.0)
Monocytes Relative: 10 %
Neutro Abs: 3.6 10*3/uL (ref 1.7–7.7)
Neutrophils Relative %: 44 %
Platelet Count: 261 10*3/uL (ref 150–400)
RBC: 4.2 MIL/uL — ABNORMAL LOW (ref 4.22–5.81)
RDW: 13.2 % (ref 11.5–15.5)
WBC Count: 8.3 10*3/uL (ref 4.0–10.5)
nRBC: 0 % (ref 0.0–0.2)

## 2019-07-29 LAB — CMP (CANCER CENTER ONLY)
ALT: 36 U/L (ref 0–44)
AST: 29 U/L (ref 15–41)
Albumin: 3.9 g/dL (ref 3.5–5.0)
Alkaline Phosphatase: 140 U/L — ABNORMAL HIGH (ref 38–126)
Anion gap: 8 (ref 5–15)
BUN: 19 mg/dL (ref 8–23)
CO2: 27 mmol/L (ref 22–32)
Calcium: 10.3 mg/dL (ref 8.9–10.3)
Chloride: 106 mmol/L (ref 98–111)
Creatinine: 1.41 mg/dL — ABNORMAL HIGH (ref 0.61–1.24)
GFR, Est AFR Am: 58 mL/min — ABNORMAL LOW (ref >60)
GFR, Estimated: 50 mL/min — ABNORMAL LOW (ref >60)
Glucose, Bld: 106 mg/dL — ABNORMAL HIGH (ref 70–99)
Potassium: 4.3 mmol/L (ref 3.5–5.1)
Sodium: 141 mmol/L (ref 135–145)
Total Bilirubin: 0.6 mg/dL (ref 0.3–1.2)
Total Protein: 7 g/dL (ref 6.5–8.1)

## 2019-07-29 NOTE — Progress Notes (Signed)
[facility] [phone]   [phone]  CONSULT NOTE  REFERRING PHYSICIAN: Dr. Lanelle Bal.  REASON FOR CONSULTATION:  73 years old white male recently diagnosed with lung cancer.  HPI Bradley Irwin is a 73 y.o. 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 Rosana Hoes.  The patient mentioned that he was followed by his primary care physician with routine imaging studies because of his history of the renal cell carcinoma and he was noted in 2017 to have new 1.1 cm spiculated nodule in the posterior left upper lobe and a small bronchogenic carcinoma could not be excluded.  This nodule was followed by regular imaging studies.  Repeat CT scan of the chest on March 05, 2019 showed significant progression in the soft tissue components associated with the left upper lobe nodule highly suspicious for bronchogenic carcinoma.  A PET scan was performed on March 14, 2019 and it showed 2.3 cm solid left upper lobe lung lesion was hypermetabolic with SUV max of 8.46 suspicious for primary adenocarcinoma of the lung.  There was also a stable semisolid nodule in the right upper lobe measuring 0.8 cm.  The PET scan also showed numerous borderline bilateral neck, supraclavicular and axillary lymph nodes.  There was little low level hypermetabolism suggesting chronic inflammatory changes.  There was no enlarged or hypermetabolic mediastinal or hilar lymph nodes to suggest metastatic disease.  The patient was referred to Dr. Servando Snare. On June 16, 2019 the patient underwent bronchoscopy, left VATS, minithoracotomy with left upper lobe wedge resection and lymph node dissection under the care of Dr. Servando Snare. The final pathology (Accession: [id number]) showed  adenocarcinoma measuring 1.2 cm with negative resection margin and the dissected lymph nodes were negative for malignancy.  There was no evidence for visceral pleural or lymphovascular invasion.  The tissue block was sent for molecular studies and PDL 1 expression.  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 Dr. Servando Snare kindly referred the patient to me today for evaluation and recommendation regarding his condition. When seen today he was feeling fine.  He denied having any significant chest pain, shortness of breath, cough or hemoptysis.  He denied having any fever or chills.  He has no nausea, vomiting, diarrhea or constipation.  He has no headache or visual changes. Family history significant for mother who died at age 74 with congestive heart failure.  Father died from lung cancer and sister had MDS. The patient is married and has no children.  He was accompanied by his wife [name] on the phone during the visit.  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  He had a history for smoking 1 pack/day for around 13 years and quit long time ago.  He has no history of alcohol or drug abuse.  HPI  Past Medical History:  Diagnosis Date  . Anxiety   . Coronary artery disease   . Eczema   . GERD (gastroesophageal reflux disease)   . Hearing loss   . Hx of cardiovascular stress test    Lexiscan Myoview 5/16:  The study is normal. No ischemia identified. This is a low risk study. Overall left ventricular systolic function was normal. LV cavity size is normal. The left ventricular ejection fraction is  normal (55-65%). Sensitivity reduced by bowel loop attenuation artifact.  . Hyperlipidemia   . Hypertension   . Hypothyroidism   . IBS (irritable bowel syndrome)   . Lung cancer  left upper lobe ([place]) 06/16/2019  . Psoriasis   . PVD (peripheral vascular disease) ([place])   . Rash   . Renal cell cancer ([place])   . Wears glasses   . Wears  hearing aid    both ears    Past Surgical History:  Procedure Laterality Date  . CARDIAC CATHETERIZATION    . CHOLECYSTECTOMY  10/26/11  . COLONOSCOPY    . CORONARY STENT PLACEMENT  2006  . FINGER SURGERY  73 y/o   cut index rt  . INTERCOSTAL NERVE BLOCK  06/16/2019   Procedure: Intercostal Nerve Block;  Surgeon: Grace Isaac, MD;  Location: [facility];  Service: Thoracic;;  . LIPOMA EXCISION Left 11/18/2013   Procedure: EXCISION POSTERIOR NECK LIPOMA ;  Surgeon: Stark Klein, MD;  Location: [facility];  Service: General;  Laterality: Left;  . LIVER BIOPSY  09/18/11  . LIVER LOBECTOMY  10/26/11  . LYMPH NODE DISSECTION  06/16/2019   Procedure: Lymph Node Dissection;  Surgeon: Grace Isaac, MD;  Location: [facility];  Service: Thoracic;;  . NEPHRECTOMY  2008   right  . TONSILLECTOMY    . VIDEO ASSISTED THORACOSCOPY (VATS)/WEDGE RESECTION Left 06/16/2019   Procedure: VIDEO ASSISTED THORACOSCOPY (VATS)/MINI THOROCOTOMY WITH LEFT UPPER LOBE WEDGE RESECTION;  Surgeon: Grace Isaac, MD;  Location: [facility];  Service: Thoracic;  Laterality: Left;  Marland Kitchen VIDEO BRONCHOSCOPY N/A 06/16/2019   Procedure: VIDEO BRONCHOSCOPY;  Surgeon: Grace Isaac, MD;  Location: [facility];  Service: Thoracic;  Laterality: N/A;    Family History  Problem Relation Age of Onset  . Hypertension Mother   . Heart failure Mother   . Heart disease Mother   . Peripheral vascular disease Father   . Lung cancer Father   . Cancer Father        lung  . Hypertension Sister   . Heart attack Maternal Uncle   . Colon cancer Neg Hx     Social History Social History   Tobacco Use  . Smoking status: Former Smoker    Packs/day: 1.00    Years: 15.00    Pack years: 15.00    Types: Cigarettes    Quit date: 10/26/1983    Years since quitting: 35.7  . Smokeless tobacco: Never Used  Substance Use Topics  . Alcohol use: Yes    Alcohol/week: 14.0 - 20.0 standard drinks    Types: 14 - 20 Glasses of wine per  week    Comment: 2-3 per day - wine, beer, liquor  . Drug use: No    Allergies  Allergen Reactions  . Sulfa Antibiotics Other (See Comments)    Happened when he was child. Does not remember reaction.  . Demerol Other (See Comments)    Severe headache.  . Pseudoephedrine Other (See Comments)    Hypertension     Current Outpatient Medications  Medication Sig Dispense Refill  . acetaminophen (TYLENOL) 500 MG tablet Take 500 mg by mouth every 6 (six) hours as needed for mild pain, fever or headache.     . ALPRAZolam (XANAX) 0.5 MG tablet Take 0.5 mg by mouth 2 (two) times daily as needed for anxiety.     Marland Kitchen amLODipine (NORVASC) 5 MG tablet Take 5 mg by mouth at bedtime.     Marland Kitchen  aspirin 81 MG tablet Take 81 mg by mouth daily.      Marland Kitchen atenolol (TENORMIN) 100 MG tablet     . atorvastatin (LIPITOR) 10 MG tablet Take 10 mg by mouth daily.      . clopidogrel (PLAVIX) 75 MG tablet TAKE 1 TABLET BY MOUTH EVERY DAY 90 tablet 3  . diphenhydrAMINE (BENADRYL) 25 MG tablet Take 25 mg by mouth at bedtime.     . hydrochlorothiazide (HYDRODIURIL) 25 MG tablet     . levothyroxine (SYNTHROID) 125 MCG tablet Take 125-250 mcg by mouth See admin instructions. Take 125 mcg by mouth Mon-Sat and on Sunday take 250 mcg  3  . omeprazole (PRILOSEC) 20 MG capsule Take 20 mg by mouth 2 (two) times daily.     Marland Kitchen oxyCODONE (OXY IR/ROXICODONE) 5 MG immediate release tablet Take 1 tablet (5 mg total) by mouth every 6 (six) hours as needed for moderate pain. 30 tablet 0  . simethicone (MYLICON) 163 MG chewable tablet Chew 125 mg by mouth every 6 (six) hours as needed for flatulence.    . valACYclovir (VALTREX) 500 MG tablet Take 500 mg by mouth 2 (two) times daily as needed (fever blisters).   0   No current facility-administered medications for this visit.     Review of Systems  Constitutional: negative Eyes: negative Ears, nose, mouth, throat, and face: negative Respiratory: negative Cardiovascular: negative  Gastrointestinal: negative Genitourinary:negative Integument/breast: negative Hematologic/lymphatic: negative Musculoskeletal:negative Neurological: negative Behavioral/Psych: negative Endocrine: negative Allergic/Immunologic: negative  Physical Exam  AGT:XMIWO, healthy, no distress, well nourished, well developed and anxious SKIN: skin color, texture, turgor are normal, no rashes or significant lesions HEAD: Normocephalic, No masses, lesions, tenderness or abnormalities EYES: normal, PERRLA, Conjunctiva are pink and non-injected EARS: External ears normal, Canals clear OROPHARYNX:no exudate, no erythema and lips, buccal mucosa, and tongue normal  NECK: supple, no adenopathy, no JVD LYMPH:  no palpable lymphadenopathy, no hepatosplenomegaly LUNGS: clear to auscultation , and palpation HEART: regular rate & rhythm, no murmurs and no gallops ABDOMEN:abdomen soft, non-tender, normal bowel sounds and no masses or organomegaly BACK: Back symmetric, no curvature., No CVA tenderness EXTREMITIES:no joint deformities, effusion, or inflammation, no edema  NEURO: alert & oriented x 3 with fluent speech, no focal motor/sensory deficits  PERFORMANCE STATUS: ECOG 1  LABORATORY DATA: Lab Results  Component Value Date   WBC 8.3 07/29/2019   HGB 12.1 (L) 07/29/2019   HCT 38.2 (L) 07/29/2019   MCV 91.0 07/29/2019   PLT 261 07/29/2019      Chemistry      Component Value Date/Time   NA 140 06/23/2019 0659   K 3.7 06/23/2019 0659   CL 105 06/23/2019 0659   CO2 26 06/23/2019 0659   BUN 9 06/23/2019 0659   CREATININE 1.24 06/23/2019 0659   CREATININE 1.30 (H) 02/06/2016 1714      Component Value Date/Time   CALCIUM 9.9 06/23/2019 0659   ALKPHOS 178 (H) 06/19/2019 0249   AST 122 (H) 06/19/2019 0249   ALT 104 (H) 06/19/2019 0249   BILITOT 0.7 06/19/2019 0249       RADIOGRAPHIC STUDIES: Dg Chest 2 View  Result Date: 07/10/2019 CLINICAL DATA:  Post left vats 06/16/2019 EXAM:  CHEST - 2 VIEW COMPARISON:  06/30/2019 FINDINGS: Postoperative changes in the left upper lobe. Linear areas of scarring in the left lung. Right lung clear. Heart is normal size. No effusions or acute bony abnormality. No pneumothorax. IMPRESSION: Postoperative changes in the left upper lobe. Areas  of scarring in the left lung. No active disease. Electronically Signed   By: Rolm Baptise M.D.   On: 07/10/2019 15:08   Dg Chest 2 View  Result Date: 06/30/2019 CLINICAL DATA:  Lung cancer, recent VATS 06/16/2019 EXAM: CHEST - 2 VIEW COMPARISON:  06/23/2019 FINDINGS: There is subtle, irregular postoperative opacity and surgical suture of the left upper lung. No significant pneumothorax fusion or pleural effusion. No acute appearing airspace opacity. Persistent subcutaneous emphysema about the left chest, which is reduced compared to prior examination. The heart and mediastinum are normal. IMPRESSION: There is subtle, irregular postoperative opacity and surgical suture of the left upper lung. No significant pneumothorax fusion or pleural effusion. No acute appearing airspace opacity. Persistent subcutaneous emphysema about the left chest, which is reduced compared to prior examination. The heart and mediastinum are normal. Electronically Signed   By: Eddie Candle M.D.   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   PLAN: I had a lengthy discussion with the patient and his wife who was available by phone during the visit about his current disease stage, prognosis and treatment options. I explained to the patient that he has early stage disease and a 5-year survival for stage Ia non-small cell lung cancer is around 80%. I also  explained to the patient that there is no survival benefit for adjuvant systemic chemotherapy or radiation to patient with a stage Ia non-small cell lung cancer. I recommended for the patient to continue on observation with repeat CT scan of the chest in 6 months. If the patient has any disease recurrence in the future, he may benefit from treatment with targeted therapy for the MET Exon 14 splice site or even treatment with immunotherapy. I gave the patient his wife the time to answer questions and answers them completely to their satisfaction. He was advised to call immediately if he has any concerning symptoms in the interval. The patient voices understanding of current disease status and treatment options and is in agreement with the current care plan.  All questions were answered. The patient knows to call the clinic with any problems, questions or concerns. We can certainly see the patient much sooner if necessary.  Thank you so much for allowing me to participate in the care of Bradley Irwin. I will continue to follow up the patient with you and assist in his care.  I spent 40 minutes counseling the patient face to face. The total time spent in the appointment was 60 minutes.  Disclaimer: This note was dictated with voice recognition software. Similar sounding words can inadvertently be transcribed and may not be corrected upon review.   Eilleen Kempf July 29, 2019, 2:15 PM

## 2019-07-30 ENCOUNTER — Telehealth: Payer: Self-pay | Admitting: Internal Medicine

## 2019-07-30 NOTE — Telephone Encounter (Signed)
Scheduled appt per 8/04 los  - mailed letter with appt date and time

## 2019-09-06 IMAGING — DX CHEST - 2 VIEW
2 series · 2 of 2 positions shown · non-contrast
Comparison: 06/19/2019

CLINICAL DATA: Status post chest tube placement.  Pneumothorax.

EXAM:
CHEST - 2 VIEW

[w chest lat]
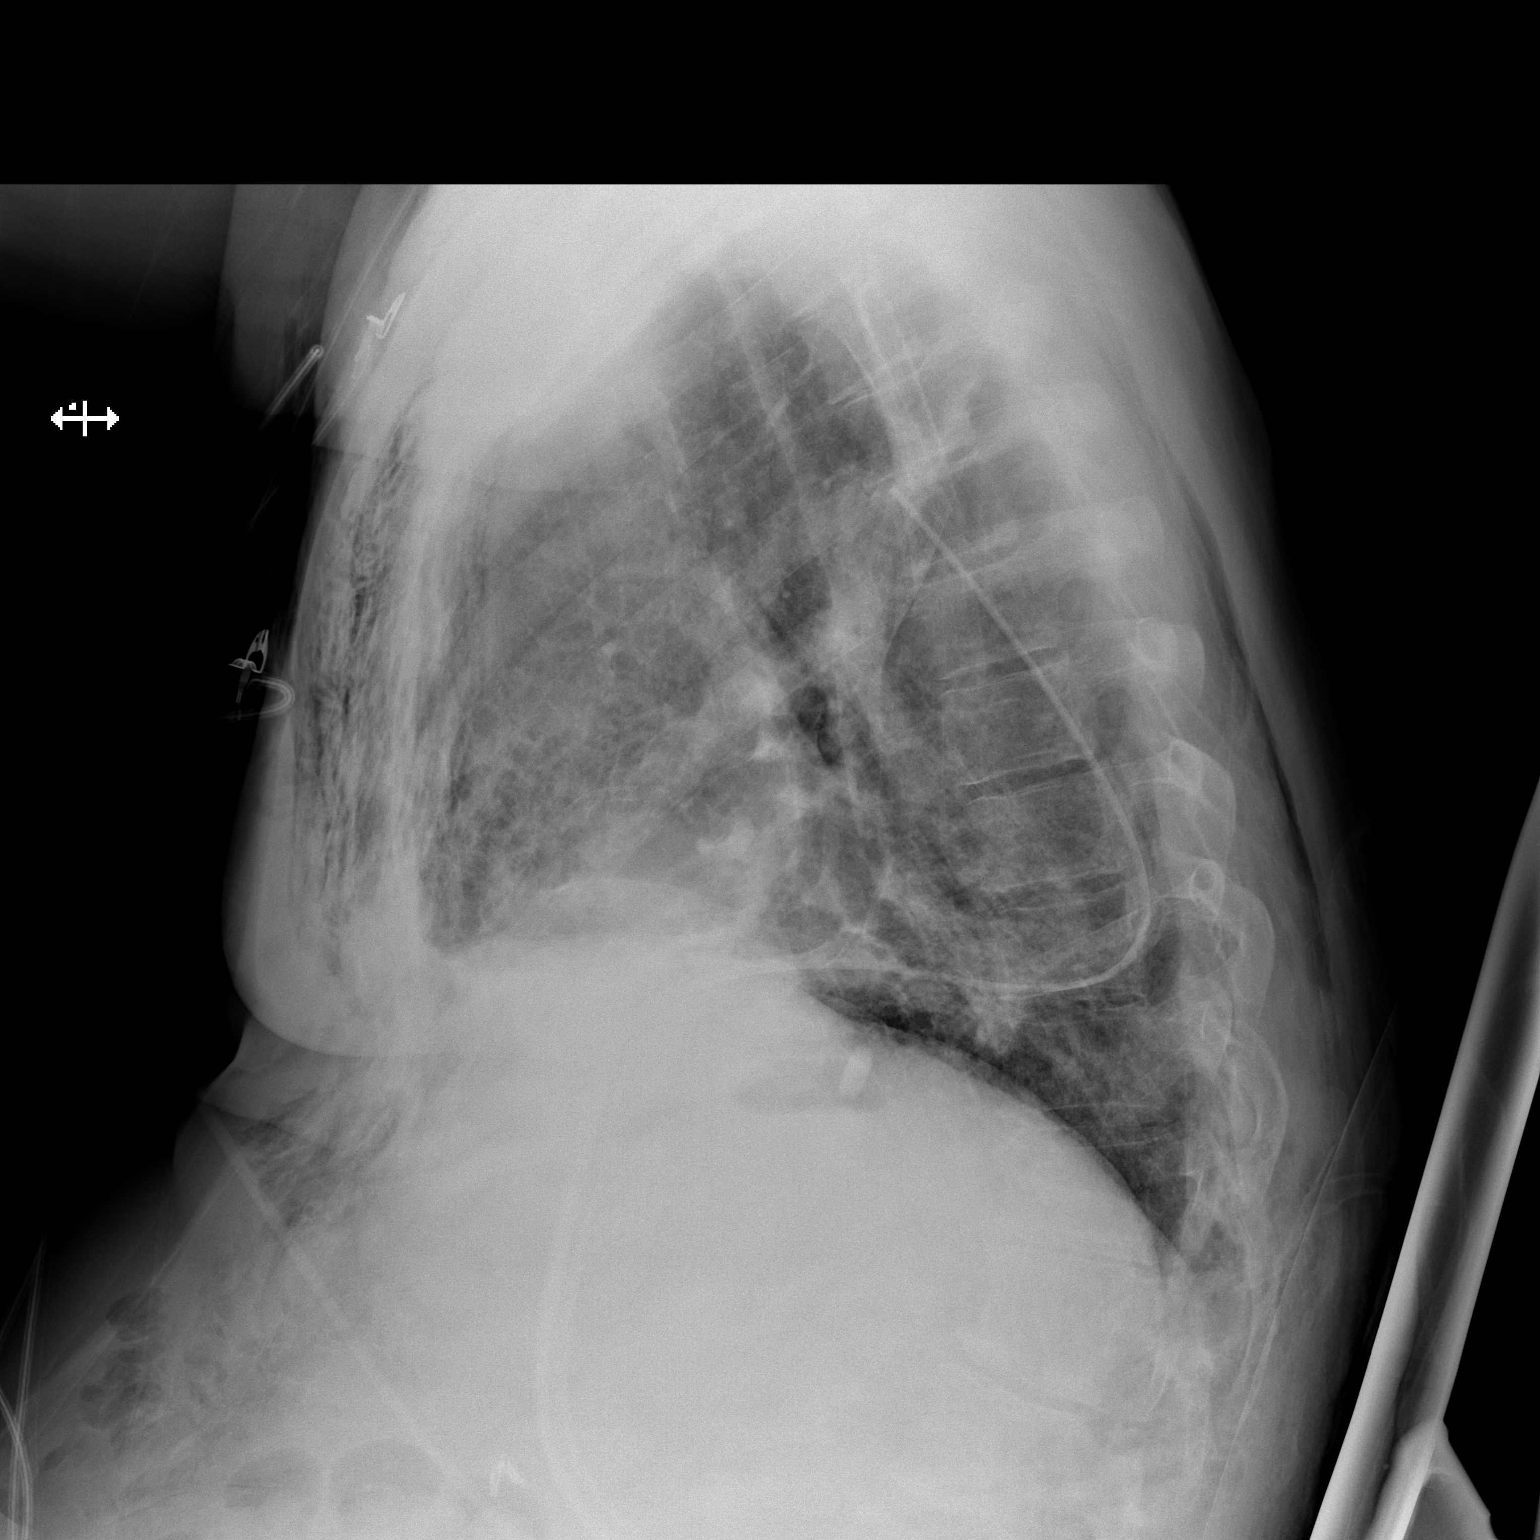

[w chest pa]
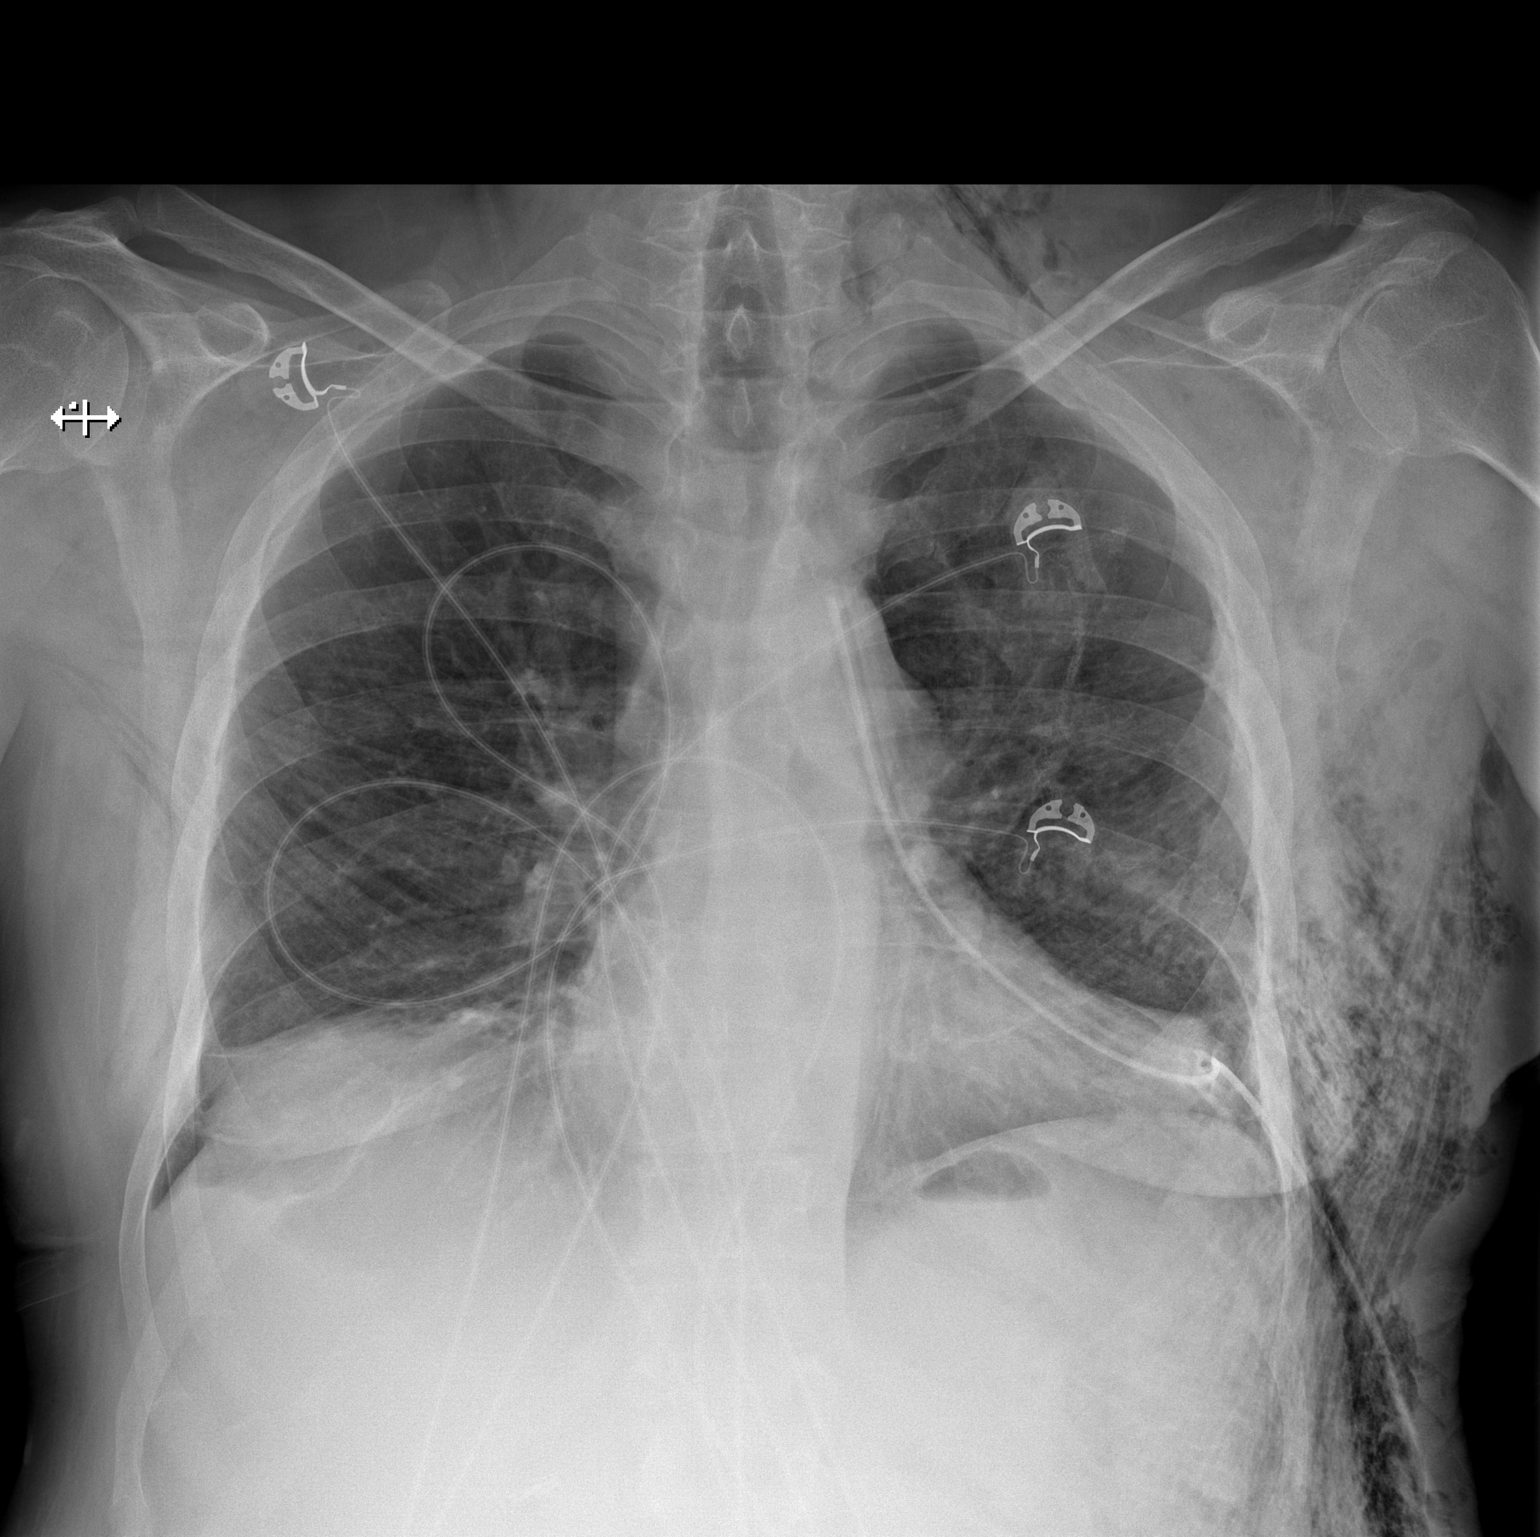

[2 of 2 positions shown; findings below may reference images not displayed]

FINDINGS: There is a left-sided chest tube, unchanged in position. Persistent
left-sided pneumothorax is identified measuring approximately 1 cm
in thickness, similar to previous exam. Similar appearance of left
chest wall emphysema. Similar appearance of subcutaneous emphysema
within the chest wall and left side of neck.
IMPRESSION: 1. No change in appearance of left-sided pneumothorax with stable
position of left chest tube.

## 2019-09-07 IMAGING — DX PORTABLE CHEST - 1 VIEW
1 series · 1 of 1 positions shown · non-contrast
Comparison: Yesterday

CLINICAL DATA: Chest tube

EXAM:
PORTABLE CHEST 1 VIEW

[chest ap]
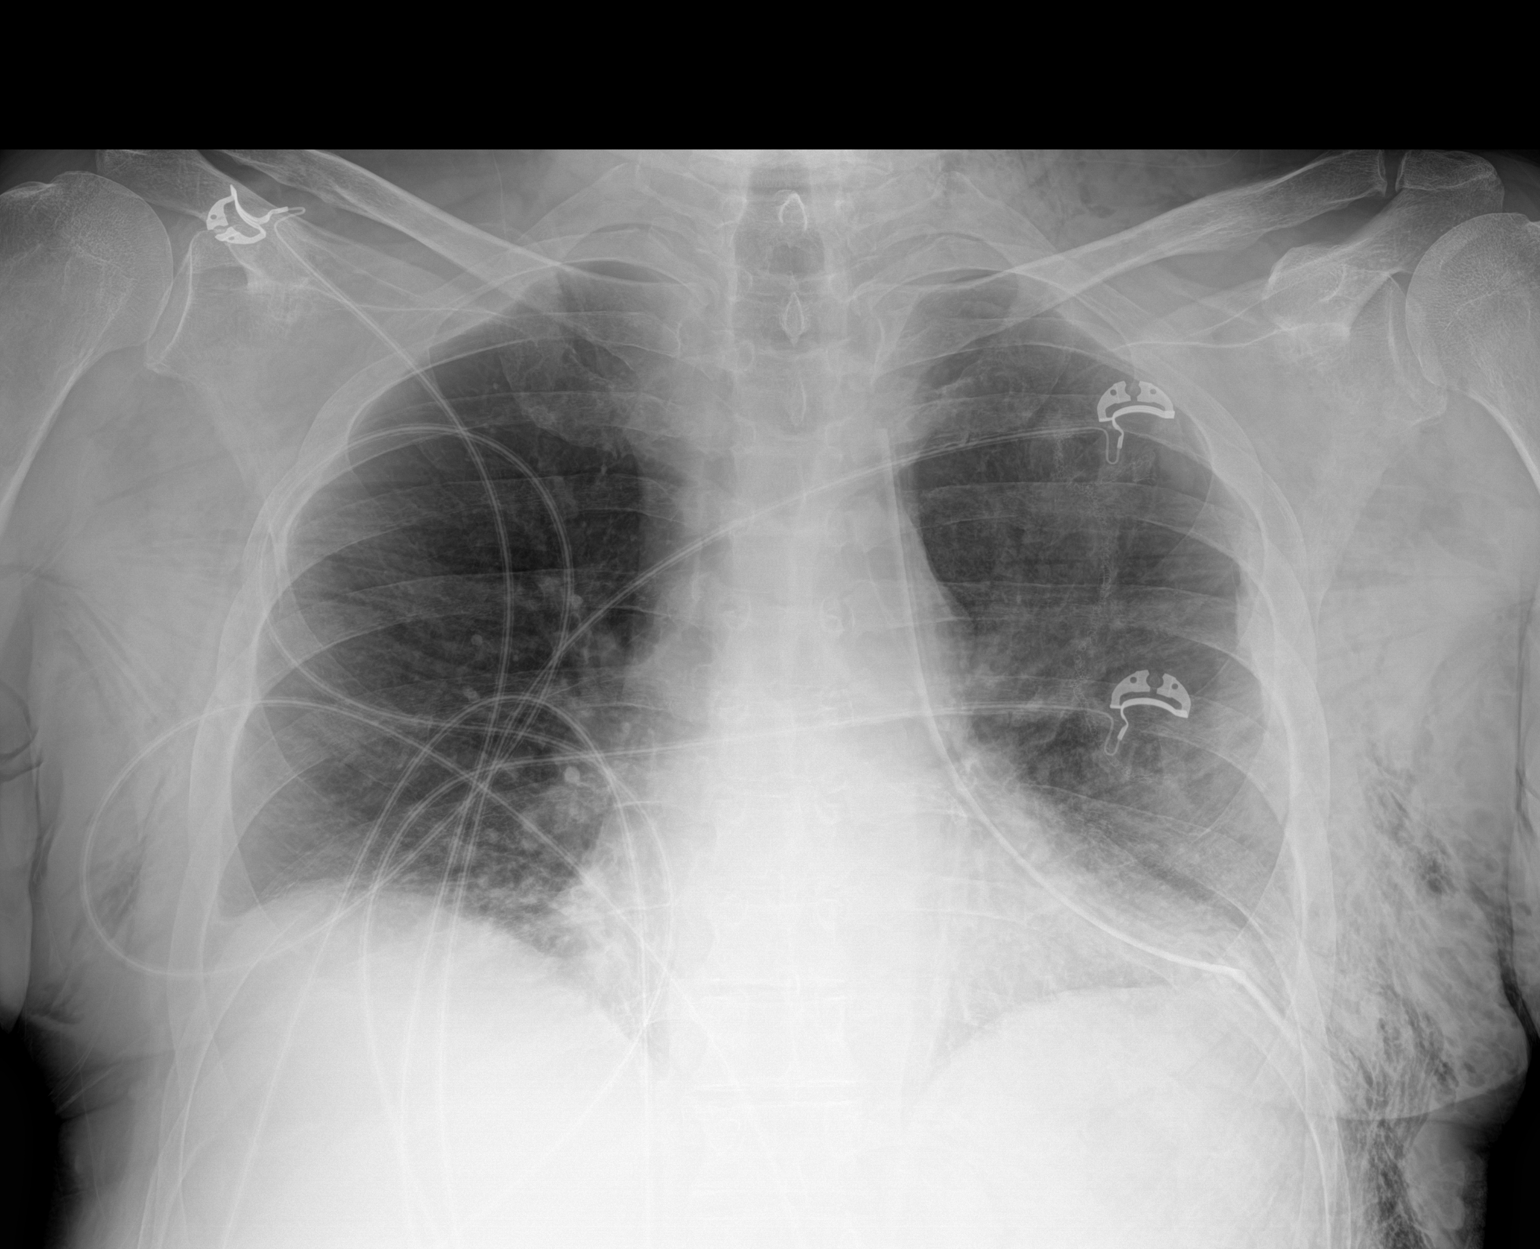

[1 of 1 positions shown; findings below may reference images not displayed]

FINDINGS: Unchanged left chest tube positioning. Small left pneumothorax is
mildly decreased by measurement. Decreasing chest wall emphysema.
Atelectatic opacity at the bases. Normal heart size.
IMPRESSION: 1. Small and slightly decreased left pneumothorax.
2. Stable atelectasis.

## 2019-09-09 IMAGING — CR CHEST - 2 VIEW
2 series · 2 of 2 positions shown · non-contrast
Comparison: Radiographs June 22, 2019.

CLINICAL DATA: Pneumothorax.  Status post lung resection.

EXAM:
CHEST - 2 VIEW

[chest pa]
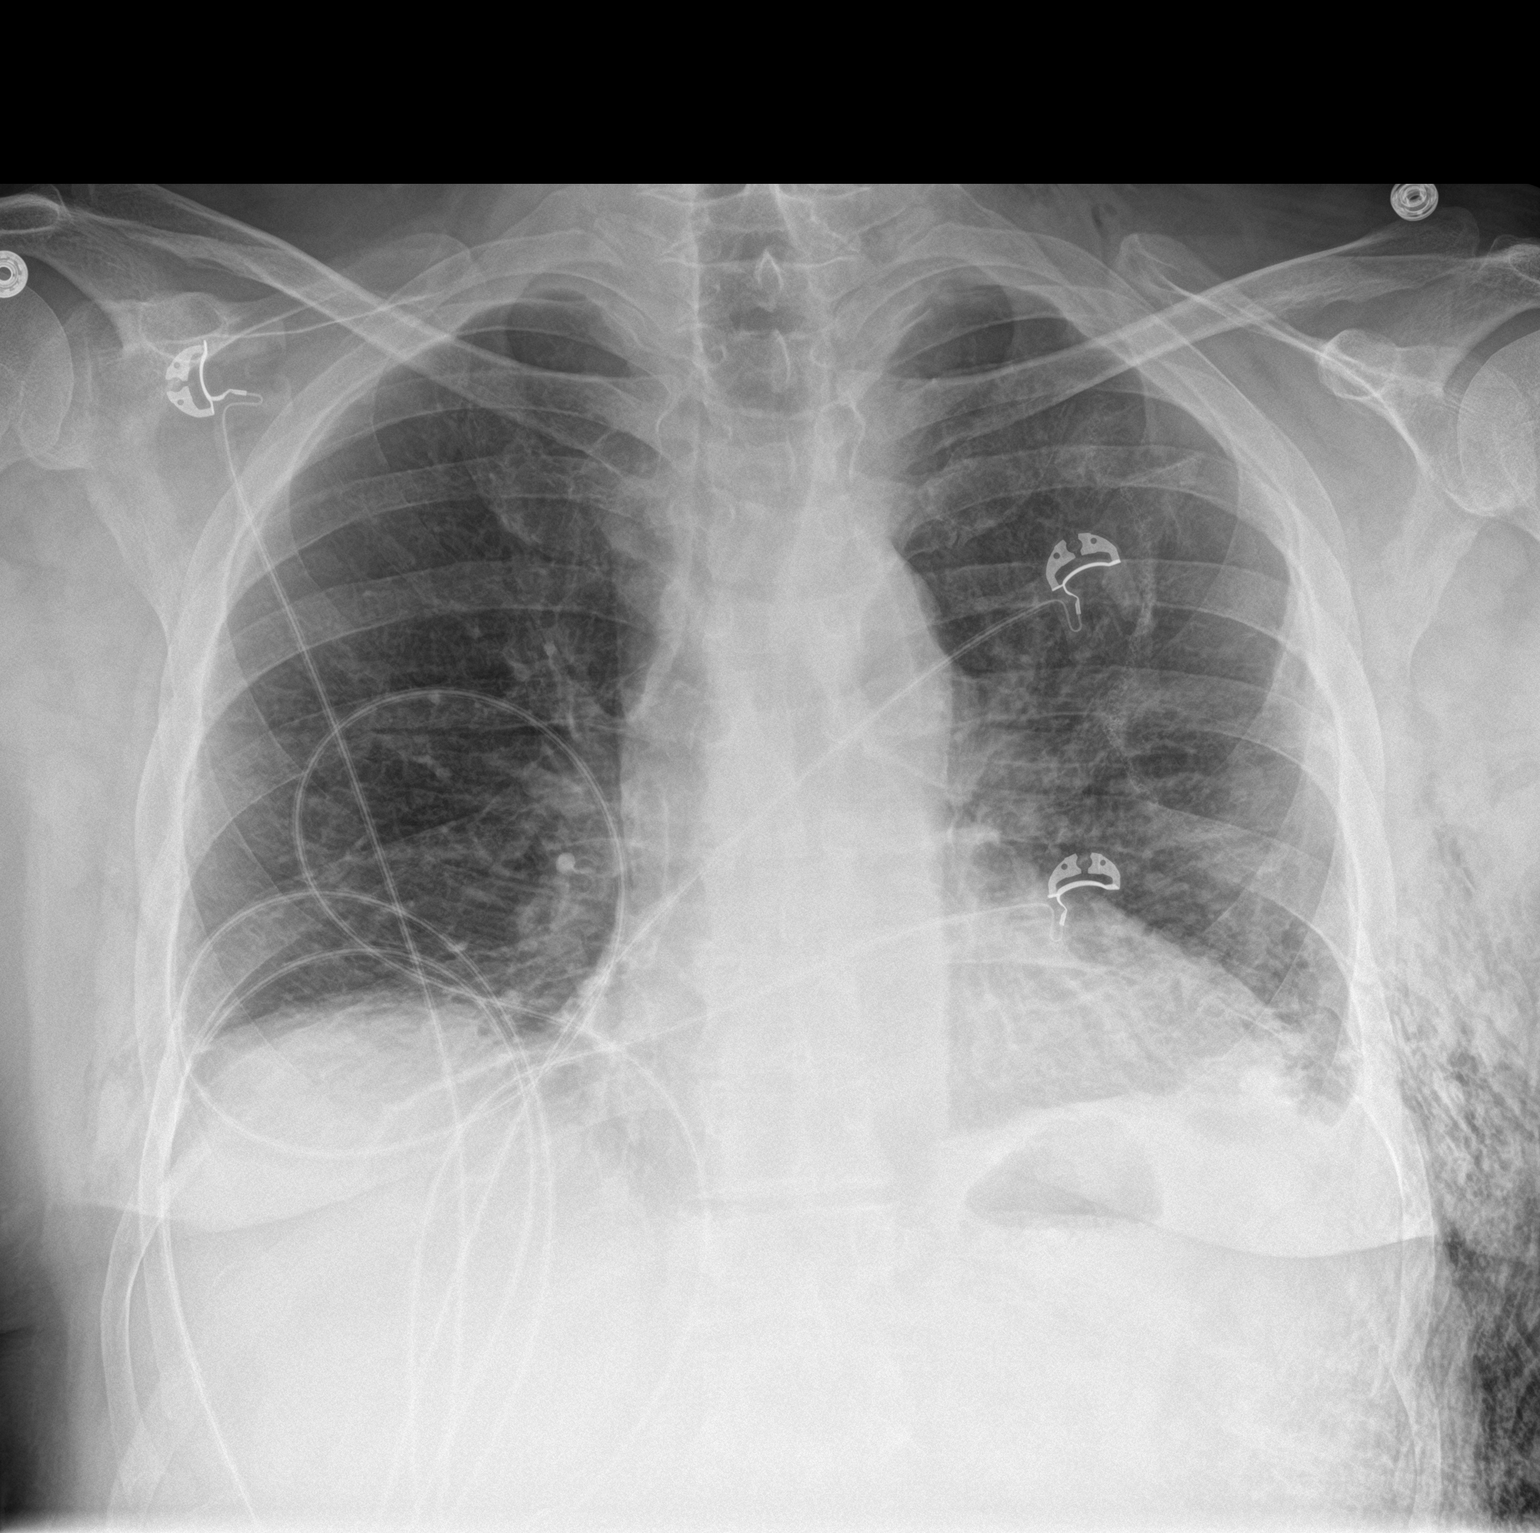

[chest lat]
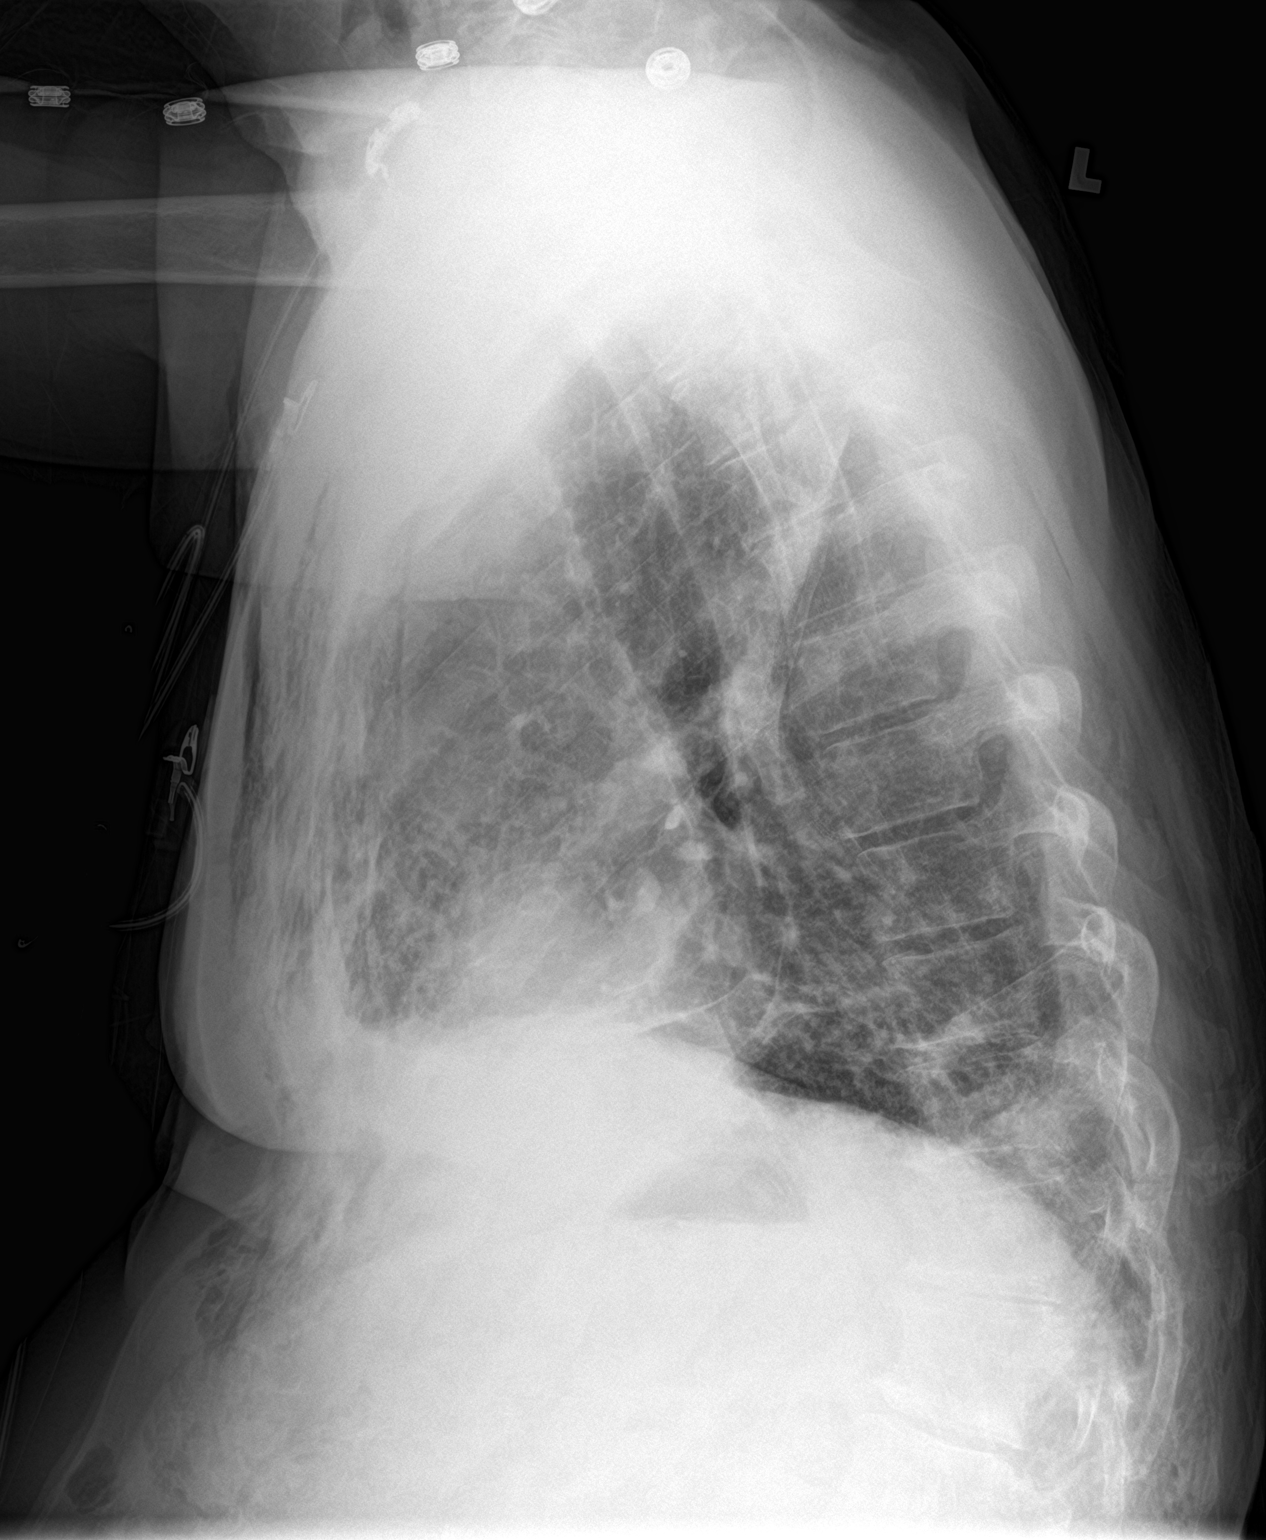

[2 of 2 positions shown; findings below may reference images not displayed]

FINDINGS: Stable cardiomediastinal silhouette. Left-sided chest tube has been
removed. Stable mild left lateral pneumothorax is noted. Stable
subcutaneous emphysema is seen over left lateral chest wall and in
left supraclavicular region. Minimal left pleural effusion is noted
with left basilar atelectasis. Stable minimal right basilar
subsegmental atelectasis is noted. Bony thorax is unremarkable.
IMPRESSION: Stable mild left lateral pneumothorax is noted status post chest
tube removal. Stable bibasilar atelectasis is noted with minimal
left pleural effusion. Stable subcutaneous emphysema.

## 2019-09-10 ENCOUNTER — Other Ambulatory Visit: Payer: Self-pay | Admitting: Cardiothoracic Surgery

## 2019-09-10 DIAGNOSIS — C3490 Malignant neoplasm of unspecified part of unspecified bronchus or lung: Secondary | ICD-10-CM

## 2019-09-11 ENCOUNTER — Encounter: Payer: Self-pay | Admitting: Cardiothoracic Surgery

## 2019-09-11 ENCOUNTER — Other Ambulatory Visit: Payer: Self-pay

## 2019-09-11 ENCOUNTER — Ambulatory Visit (INDEPENDENT_AMBULATORY_CARE_PROVIDER_SITE_OTHER): Payer: Self-pay | Admitting: Cardiothoracic Surgery

## 2019-09-11 ENCOUNTER — Ambulatory Visit
Admission: RE | Admit: 2019-09-11 | Discharge: 2019-09-11 | Disposition: A | Discharge status: Home / Self Care | Payer: Medicare Other | Source: Ambulatory Visit | Attending: Cardiothoracic Surgery | Admitting: Cardiothoracic Surgery

## 2019-09-11 VITALS — BP 113/67 | HR 67 | Temp 97.5°F | Resp 16 | Ht 66.0 in | Wt 177.0 lb

## 2019-09-11 DIAGNOSIS — Z09 Encounter for follow-up examination after completed treatment for conditions other than malignant neoplasm: Secondary | ICD-10-CM

## 2019-09-11 DIAGNOSIS — C3492 Malignant neoplasm of unspecified part of left bronchus or lung: Secondary | ICD-10-CM

## 2019-09-11 DIAGNOSIS — C3490 Malignant neoplasm of unspecified part of unspecified bronchus or lung: Secondary | ICD-10-CM

## 2019-09-11 NOTE — Progress Notes (Signed)
[address]       [place] [address]             [phone]                  Elijan D Landino [place] Medical Record #[id number] Date of Birth: 13-Nov-1946  Referring DV:VOHYW, Christean Grief, MD Primary Cardiology: Primary Care:Green, Christean Grief, MD  Chief Complaint:  Follow Up Visit OPERATIVE REPORT DATE OF PROCEDURE:  06/16/2019 PREOPERATIVE DIAGNOSIS:  Left upper lobe lung nodule. POSTOPERATIVE DIAGNOSIS:  Adenocarcinoma of the lung. PROCEDURES PERFORMED:  Bronchoscopy, left video-assisted thoracoscopy, mini thoracotomy, wedge resection, left upper lobe with lymph node dissection and intercostal nerve block.  Cancer Staging Lung cancer  left upper lobe [facility]) Staging form: Lung, AJCC 8th Edition - Pathologic stage from 06/17/2019: Stage IA2 (pT1b, pN0, cM0) - Signed by Grace Isaac, MD on 06/17/2019   History of Present Illness:     Patient has no complaints.  Returns today about 3 months following resection of adenocarcinoma of the left upper lobe.  He denies shortness of breath or wheezing.     Zubrod Score: At the time of surgery this patient's most appropriate activity status/level should be described as: []     0    Normal activity, no symptoms [x]     1    Restricted in physical strenuous activity but ambulatory, able to do out light work []     2    Ambulatory and capable of self care, unable to do work activities, up and about                 >50 % of waking hours                                                                                   []     3    Only limited self care, in bed greater than 50% of waking hours []     4    Completely disabled, no self care, confined to bed or chair []     5    Moribund  Social History   Tobacco Use  Smoking Status Former Smoker  . Packs/day: 1.00  . Years: 15.00  . Pack years: 15.00  . Types: Cigarettes  . Quit date: 10/26/1983  . Years since quitting: 35.9  Smokeless Tobacco Never Used        Allergies  Allergen Reactions  . Sulfa Antibiotics Other (See Comments)    Happened when he was child. Does not remember reaction.  . Demerol Other (See Comments)    Severe headache.  . Pseudoephedrine Other (See Comments)    Hypertension     Current Outpatient Medications  Medication Sig Dispense Refill  . acetaminophen (TYLENOL) 500 MG tablet Take 500 mg by mouth every 6 (six) hours as needed for mild pain, fever or headache.     . ALPRAZolam (XANAX) 0.5 MG tablet Take 0.5 mg by mouth 2 (two) times daily as needed for anxiety.     Marland Kitchen amLODipine (NORVASC) 5 MG tablet Take 5 mg by mouth at bedtime.     Marland Kitchen aspirin 81 MG tablet  Take 81 mg by mouth daily.      Marland Kitchen atenolol (TENORMIN) 100 MG tablet     . atorvastatin (LIPITOR) 10 MG tablet Take 10 mg by mouth daily.      . clopidogrel (PLAVIX) 75 MG tablet TAKE 1 TABLET BY MOUTH EVERY DAY 90 tablet 3  . diphenhydrAMINE (BENADRYL) 25 MG tablet Take 25 mg by mouth at bedtime.     . hydrochlorothiazide (HYDRODIURIL) 25 MG tablet     . ibuprofen (ADVIL) 200 MG tablet Take 200-400 mg by mouth every 6 (six) hours as needed.    Marland Kitchen levothyroxine (SYNTHROID) 125 MCG tablet Take 125-250 mcg by mouth See admin instructions. Take 125 mcg by mouth Mon-Sat and on Sunday take 250 mcg  3  . omeprazole (PRILOSEC) 20 MG capsule Take 20 mg by mouth 2 (two) times daily.     . simethicone (MYLICON) 979 MG chewable tablet Chew 125 mg by mouth every 6 (six) hours as needed for flatulence.    . valACYclovir (VALTREX) 500 MG tablet Take 500 mg by mouth 2 (two) times daily as needed (fever blisters).   0  . clidinium-chlordiazePOXIDE (LIBRAX) 5-2.5 MG capsule Take 2 capsules by mouth daily as needed. Takes for  IBS     No current facility-administered medications for this visit.        Physical Exam: BP 113/67 (BP Location: Right Arm, Patient Position: Sitting, Cuff Size: Normal)   Pulse 67   Temp (!) 97.5 F (36.4 C)   Resp 16   Ht 5\' 6"  (1.676 m)   Wt 177  lb (80.3 kg)   SpO2 97% Comment: RA  BMI 28.57 kg/m   General appearance: alert and cooperative Head: Normocephalic, without obvious abnormality, atraumatic Neck: no adenopathy, no carotid bruit, no JVD, supple, symmetrical, trachea midline and thyroid not enlarged, symmetric, no tenderness/mass/nodules Lymph nodes: Cervical, supraclavicular, and axillary nodes normal. Resp: clear to auscultation bilaterally Cardio: regular rate and rhythm, S1, S2 normal, no murmur, click, rub or gallop GI: soft, non-tender; bowel sounds normal; no masses,  no organomegaly Extremities: extremities normal, atraumatic, no cyanosis or edema and Homans sign is negative, no sign of DVT Neurologic: Grossly normal  Diagnostic Studies & Laboratory data:         Recent Radiology Findings:  Dg Chest 2 View  Result Date: 07/10/2019 CLINICAL DATA:  Post left vats 06/16/2019 EXAM: CHEST - 2 VIEW COMPARISON:  06/30/2019 FINDINGS: Postoperative changes in the left upper lobe. Linear areas of scarring in the left lung. Right lung clear. Heart is normal size. No effusions or acute bony abnormality. No pneumothorax. IMPRESSION: Postoperative changes in the left upper lobe. Areas of scarring in the left lung. No active disease. Electronically Signed   By: Rolm Baptise M.D.   On: 07/10/2019 15:08    I have independently reviewed the above radiology findings and reviewed findings  with the patient.  Recent Labs: Lab Results  Component Value Date   WBC 8.3 07/29/2019   HGB 12.1 (L) 07/29/2019   HCT 38.2 (L) 07/29/2019   PLT 261 07/29/2019   GLUCOSE 106 (H) 07/29/2019   ALT 36 07/29/2019   AST 29 07/29/2019   NA 141 07/29/2019   K 4.3 07/29/2019   CL 106 07/29/2019   CREATININE 1.41 (H) 07/29/2019   BUN 19 07/29/2019   CO2 27 07/29/2019   INR 1.0 06/12/2019      Assessment / Plan:   Patient stablepostop after recent lung resection.  Wounds  are well-healed aHe will be seen in the multidisciplinary thoracic  oncology clinic, as it is PDL 1 was significantly elevated, increasing his overall risk of recurrence.  Patient has a CT scan of the chest ordered by medical oncology for February 2021, I will plan to see him approximately 3 months after that in May 2021  Medication Changes: No orders of the defined types were placed in this encounter.   Grace Isaac 09/11/2019 1:39 PM

## 2019-10-20 ENCOUNTER — Ambulatory Visit (INDEPENDENT_AMBULATORY_CARE_PROVIDER_SITE_OTHER): Payer: Medicare Other | Admitting: Cardiovascular Disease

## 2019-10-20 ENCOUNTER — Other Ambulatory Visit: Payer: Self-pay

## 2019-10-20 ENCOUNTER — Encounter: Payer: Self-pay | Admitting: Cardiovascular Disease

## 2019-10-20 VITALS — BP 134/62 | HR 67 | Ht 66.0 in | Wt 187.0 lb

## 2019-10-20 DIAGNOSIS — E7800 Pure hypercholesterolemia, unspecified: Secondary | ICD-10-CM | POA: Diagnosis not present

## 2019-10-20 DIAGNOSIS — I10 Essential (primary) hypertension: Secondary | ICD-10-CM

## 2019-10-20 DIAGNOSIS — I2510 Atherosclerotic heart disease of native coronary artery without angina pectoris: Secondary | ICD-10-CM | POA: Diagnosis not present

## 2019-10-20 NOTE — Progress Notes (Signed)
Chief Complaint  Patient presents with  . Follow-up    CAD   History of Present Illness: 73 yo male with history of CAD, HTN, HLD, renal cell carcinoma s/p right nephrectomy, lung cancer s/p resection here today for cardiac follow up. He has been followed in the past by Dr. Lia Foyer. Cardiac cath February 2006 with a 2.75 x 28 mm Costar study stent (DES) placed in the mid LAD. Last cath November 2006 with patent mid LAD stent, 40% proximal LAD stenosis, 80% small diagonal branch stenosis, minimal disease in the Circumflex and RCA. Echo 03/18/13 with normal LV function, mild LVH. Nuclear stress test May 2016 with no evidence of ischemia. Lung mass found in early 2020 and biopsy was c/w adenocarcinoma. He is now s/p left upper lobe lung resection. Nuclear stress test April 2020 without ischemia.   He is here today for follow up. The patient denies any chest pain, dyspnea, palpitations, lower extremity edema, orthopnea, PND, dizziness, near syncope or syncope.   Primary Care Physician: [name], MD  Past Medical History:  Diagnosis Date  . Anxiety   . Coronary artery disease   . Eczema   . GERD (gastroesophageal reflux disease)   . Hearing loss   . Hx of cardiovascular stress test    Lexiscan Myoview 5/16:  The study is normal. No ischemia identified. This is a low risk study. Overall left ventricular systolic function was normal. LV cavity size is normal. The left ventricular ejection fraction is normal (55-65%). Sensitivity reduced by bowel loop attenuation artifact.  . Hyperlipidemia   . Hypertension   . Hypothyroidism   . IBS (irritable bowel syndrome)   . Lung cancer  left upper lobe ([place]) 06/16/2019  . Psoriasis   . PVD (peripheral vascular disease) ([place])   . Rash   . Renal cell cancer ([place])   . Wears glasses   . Wears hearing aid    both ears    Past Surgical History:  Procedure Laterality Date  . CARDIAC CATHETERIZATION    . CHOLECYSTECTOMY  10/26/11  . COLONOSCOPY     . CORONARY STENT PLACEMENT  2006  . FINGER SURGERY  73 y/o   cut index rt  . INTERCOSTAL NERVE BLOCK  06/16/2019   Procedure: Intercostal Nerve Block;  Surgeon: Grace Isaac, MD;  Location: [facility];  Service: Thoracic;;  . LIPOMA EXCISION Left 11/18/2013   Procedure: EXCISION POSTERIOR NECK LIPOMA ;  Surgeon: Stark Klein, MD;  Location: [facility];  Service: General;  Laterality: Left;  . LIVER BIOPSY  09/18/11  . LIVER LOBECTOMY  10/26/11  . LYMPH NODE DISSECTION  06/16/2019   Procedure: Lymph Node Dissection;  Surgeon: Grace Isaac, MD;  Location: [facility];  Service: Thoracic;;  . NEPHRECTOMY  2008   right  . TONSILLECTOMY    . VIDEO ASSISTED THORACOSCOPY (VATS)/WEDGE RESECTION Left 06/16/2019   Procedure: VIDEO ASSISTED THORACOSCOPY (VATS)/MINI THOROCOTOMY WITH LEFT UPPER LOBE WEDGE RESECTION;  Surgeon: Grace Isaac, MD;  Location: [facility];  Service: Thoracic;  Laterality: Left;  Marland Kitchen VIDEO BRONCHOSCOPY N/A 06/16/2019   Procedure: VIDEO BRONCHOSCOPY;  Surgeon: Grace Isaac, MD;  Location: [facility];  Service: Thoracic;  Laterality: N/A;    Current Outpatient Medications  Medication Sig Dispense Refill  . acetaminophen (TYLENOL) 500 MG tablet Take 500 mg by mouth every 6 (six) hours as needed for mild pain, fever or headache.     . ALPRAZolam (XANAX) 0.5 MG tablet Take  0.5 mg by mouth 2 (two) times daily as needed for anxiety.     Marland Kitchen amLODipine (NORVASC) 5 MG tablet Take 10 mg by mouth at bedtime.     Marland Kitchen aspirin 81 MG tablet Take 81 mg by mouth daily.      Marland Kitchen atenolol (TENORMIN) 100 MG tablet     . atorvastatin (LIPITOR) 10 MG tablet Take 10 mg by mouth daily.      . clidinium-chlordiazePOXIDE (LIBRAX) 5-2.5 MG capsule Take 2 capsules by mouth daily as needed. Takes for  IBS    . clopidogrel (PLAVIX) 75 MG tablet TAKE 1 TABLET BY MOUTH EVERY DAY 90 tablet 3  . diphenhydrAMINE (BENADRYL) 25 MG tablet Take 25 mg by mouth at bedtime.     . hydrochlorothiazide  (HYDRODIURIL) 25 MG tablet     . ibuprofen (ADVIL) 200 MG tablet Take 200-400 mg by mouth every 6 (six) hours as needed.    Marland Kitchen levothyroxine (SYNTHROID) 125 MCG tablet Take 125-250 mcg by mouth See admin instructions. Take 125 mcg by mouth Mon-Sat and on Sunday take 250 mcg  3  . omeprazole (PRILOSEC) 20 MG capsule Take 20 mg by mouth 2 (two) times daily.     . simethicone (MYLICON) 564 MG chewable tablet Chew 125 mg by mouth every 6 (six) hours as needed for flatulence.    . valACYclovir (VALTREX) 500 MG tablet Take 500 mg by mouth 2 (two) times daily as needed (fever blisters).   0   No current facility-administered medications for this visit.     Allergies  Allergen Reactions  . Sulfa Antibiotics Other (See Comments)    Happened when he was child. Does not remember reaction.  . Demerol Other (See Comments)    Severe headache.  . Pseudoephedrine Other (See Comments)    Hypertension     Social History   Socioeconomic History  . Marital status: Married    Spouse name: Not on file  . Number of children: 0  . Years of education: Not on file  . Highest education level: Not on file  Occupational History  . Occupation: Retired from state of [place]  Social Needs  . Financial resource strain: Not on file  . Food insecurity    Worry: Not on file    Inability: Not on file  . Transportation needs    Medical: Not on file    Non-medical: Not on file  Tobacco Use  . Smoking status: Former Smoker    Packs/day: 1.00    Years: 15.00    Pack years: 15.00    Types: Cigarettes    Quit date: 10/26/1983    Years since quitting: 36.0  . Smokeless tobacco: Never Used  Substance and Sexual Activity  . Alcohol use: Yes    Alcohol/week: 14.0 - 20.0 standard drinks    Types: 14 - 20 Glasses of wine per week    Comment: 2-3 per day - wine, beer, liquor  . Drug use: No  . Sexual activity: Not on file  Lifestyle  . Physical activity    Days per week: Not on file    Minutes per session: Not on  file  . Stress: Not on file  Relationships  . Social Herbalist on phone: Not on file    Gets together: Not on file    Attends religious service: Not on file    Active member of club or organization: Not on file    Attends meetings of clubs  or organizations: Not on file    Relationship status: Not on file  . Intimate partner violence    Fear of current or ex partner: Not on file    Emotionally abused: Not on file    Physically abused: Not on file    Forced sexual activity: Not on file  Other Topics Concern  . Not on file  Social History Narrative  . Not on file    Family History  Problem Relation Age of Onset  . Hypertension Mother   . Heart failure Mother   . Heart disease Mother   . Peripheral vascular disease Father   . Lung cancer Father   . Cancer Father        lung  . Hypertension Sister   . Heart attack Maternal Uncle   . Colon cancer Neg Hx     Review of Systems:  As stated in the HPI and otherwise negative.   BP 134/62   Pulse 67   Ht 5\' 6"  (1.676 m)   Wt 187 lb (84.8 kg)   SpO2 99%   BMI 30.18 kg/m   Physical Examination:  General: Well developed, well nourished, NAD  HEENT: OP clear, mucus membranes moist  SKIN: warm, dry. No rashes. Neuro: No focal deficits  Musculoskeletal: Muscle strength 5/5 all ext  Psychiatric: Mood and affect normal  Neck: No JVD, no carotid bruits, no thyromegaly, no lymphadenopathy.  Lungs:Clear bilaterally, no wheezes, rhonci, crackles Cardiovascular: Regular rate and rhythm. No murmurs, gallops or rubs. Abdomen:Soft. Bowel sounds present. Non-tender.  Extremities: No lower extremity edema. Pulses are 2 + in the bilateral DP/PT.  Cardiac cath 11/02/05: 1. The left main coronary is free of critical disease.  2. The left anterior descending artery demonstrates about an area of about  30-40% narrowing segmentally in the proximal LAD overlapping the origin  of the diagonal. The diagonal is moderate in  distribution, but is fairly  small in caliber. It has an 80% ostial stenosis. Compared to the  previous study there is perhaps very mild progression of disease in the  proximal LAD. The mid vessel, where the stent was previously placed  remains widely patent with excellent runoff into the distal vessel. The  distal LAD is free of critical disease.  3. The circumflex is large-caliber vessel with mild proximal irregularity  overlapping the origin of the first marginal branch. Importantly, after  the administration of intracoronary nitroglycerin this area looked more  like 30-40% due to vasodilatation of the distal vessel. However, none  of this appeared to be flow-limiting and was fairly large in caliber.  4. The right coronary artery is a large-caliber vessel. There is perhaps  mild irregularity in the mid vessel but no areas of high-grade disease.  5. Ventriculography in the RAO projection reveals vigorous global systolic  function without segmental wall motion abnormality.  Echo 03/18/13: Left ventricle: The cavity size was normal. Wall thickness was increased in a pattern of mild LVH. There was mild concentric hypertrophy. Systolic function was normal. The estimated ejection fraction was in the range of 60% to 65%. Wall motion was normal; there were no regional wall motion abnormalities. Left ventricular diastolic function parameters were normal. - Aortic valve: Trivial regurgitation.  EKG:  EKG is not ordered today.  The EKG is reviewed by me and shows  Recent Labs: 07/29/2019: ALT 36; BUN 19; Creatinine 1.41; Hemoglobin 12.1; Platelet Count 261; Potassium 4.3; Sodium 141   Lipid Panel Followed in primary care  Wt Readings from Last 3 Encounters:  10/20/19 187 lb (84.8 kg)  09/11/19 177 lb (80.3 kg)  07/29/19 185 lb 3.2 oz (84 kg)     Other studies Reviewed: Additional studies/ records that were reviewed today include. Review of the above records  demonstrates:  Assessment and Plan:   1. CAD without angina: He has no chest pain. Will continue dual anti-platelet therapy with ASA and Plavix (given first generation drug eluting stent). Will also continue statin and beta blocker.      2. HTN: BP is controlled  3. HLD: Lipids followed in primary care. Will continue statin.   Current medicines are reviewed at length with the patient today.  The patient does not have concerns regarding medicines.  The following changes have been made:  no change  Labs/ tests ordered today include:  No orders of the defined types were placed in this encounter.   Disposition:   FU with me in 12  months  Signed, [name], MD 10/20/2019 4:40 PM    [facility] [place], [place], [place]  [address] Phone: [phone]; Fax: [phone]

## 2019-10-20 NOTE — Patient Instructions (Signed)

## 2020-01-30 ENCOUNTER — Inpatient Hospital Stay: Payer: Medicare PPO | Attending: Internal Medicine

## 2020-01-30 ENCOUNTER — Other Ambulatory Visit: Payer: Self-pay

## 2020-01-30 ENCOUNTER — Other Ambulatory Visit: Payer: Medicare Other

## 2020-01-30 ENCOUNTER — Ambulatory Visit
Admission: RE | Admit: 2020-01-30 | Discharge: 2020-01-30 | Disposition: A | Discharge status: Home / Self Care | Payer: Medicare PPO | Source: Ambulatory Visit | Attending: Internal Medicine | Admitting: Internal Medicine

## 2020-01-30 DIAGNOSIS — Z905 Acquired absence of kidney: Secondary | ICD-10-CM | POA: Diagnosis not present

## 2020-01-30 DIAGNOSIS — I700 Atherosclerosis of aorta: Secondary | ICD-10-CM | POA: Insufficient documentation

## 2020-01-30 DIAGNOSIS — D7389 Other diseases of spleen: Secondary | ICD-10-CM | POA: Insufficient documentation

## 2020-01-30 DIAGNOSIS — I10 Essential (primary) hypertension: Secondary | ICD-10-CM | POA: Insufficient documentation

## 2020-01-30 DIAGNOSIS — I2510 Atherosclerotic heart disease of native coronary artery without angina pectoris: Secondary | ICD-10-CM | POA: Diagnosis not present

## 2020-01-30 DIAGNOSIS — Z882 Allergy status to sulfonamides status: Secondary | ICD-10-CM | POA: Insufficient documentation

## 2020-01-30 DIAGNOSIS — K760 Fatty (change of) liver, not elsewhere classified: Secondary | ICD-10-CM | POA: Diagnosis not present

## 2020-01-30 DIAGNOSIS — C3490 Malignant neoplasm of unspecified part of unspecified bronchus or lung: Secondary | ICD-10-CM

## 2020-01-30 DIAGNOSIS — R590 Localized enlarged lymph nodes: Secondary | ICD-10-CM | POA: Insufficient documentation

## 2020-01-30 DIAGNOSIS — Z85528 Personal history of other malignant neoplasm of kidney: Secondary | ICD-10-CM | POA: Insufficient documentation

## 2020-01-30 DIAGNOSIS — C3412 Malignant neoplasm of upper lobe, left bronchus or lung: Secondary | ICD-10-CM | POA: Diagnosis not present

## 2020-01-30 DIAGNOSIS — Z885 Allergy status to narcotic agent status: Secondary | ICD-10-CM | POA: Diagnosis not present

## 2020-01-30 DIAGNOSIS — Z79899 Other long term (current) drug therapy: Secondary | ICD-10-CM | POA: Diagnosis not present

## 2020-01-30 LAB — CBC WITH DIFFERENTIAL (CANCER CENTER ONLY)
Abs Immature Granulocytes: 0.06 10*3/uL (ref 0.00–0.07)
Basophils Absolute: 0.1 10*3/uL (ref 0.0–0.1)
Basophils Relative: 0 %
Eosinophils Absolute: 0.3 10*3/uL (ref 0.0–0.5)
Eosinophils Relative: 2 %
HCT: 41 % (ref 39.0–52.0)
Hemoglobin: 12.2 g/dL — ABNORMAL LOW (ref 13.0–17.0)
Immature Granulocytes: 1 %
Lymphocytes Relative: 43 %
Lymphs Abs: 4.8 10*3/uL — ABNORMAL HIGH (ref 0.7–4.0)
MCH: 22.8 pg — ABNORMAL LOW (ref 26.0–34.0)
MCHC: 29.8 g/dL — ABNORMAL LOW (ref 30.0–36.0)
MCV: 76.8 fL — ABNORMAL LOW (ref 80.0–100.0)
Monocytes Absolute: 0.5 10*3/uL (ref 0.1–1.0)
Monocytes Relative: 5 %
Neutro Abs: 5.6 10*3/uL (ref 1.7–7.7)
Neutrophils Relative %: 49 %
Platelet Count: 259 10*3/uL (ref 150–400)
RBC: 5.34 MIL/uL (ref 4.22–5.81)
RDW: 17.5 % — ABNORMAL HIGH (ref 11.5–15.5)
WBC Count: 11.3 10*3/uL — ABNORMAL HIGH (ref 4.0–10.5)
nRBC: 0 % (ref 0.0–0.2)

## 2020-01-30 LAB — CMP (CANCER CENTER ONLY)
ALT: 37 U/L (ref 0–44)
AST: 29 U/L (ref 15–41)
Albumin: 4.3 g/dL (ref 3.5–5.0)
Alkaline Phosphatase: 105 U/L (ref 38–126)
Anion gap: 12 (ref 5–15)
BUN: 24 mg/dL — ABNORMAL HIGH (ref 8–23)
CO2: 26 mmol/L (ref 22–32)
Calcium: 9.6 mg/dL (ref 8.9–10.3)
Chloride: 102 mmol/L (ref 98–111)
Creatinine: 1.61 mg/dL — ABNORMAL HIGH (ref 0.61–1.24)
GFR, Est AFR Am: 49 mL/min — ABNORMAL LOW (ref >60)
GFR, Estimated: 42 mL/min — ABNORMAL LOW (ref >60)
Glucose, Bld: 129 mg/dL — ABNORMAL HIGH (ref 70–99)
Potassium: 3.4 mmol/L — ABNORMAL LOW (ref 3.5–5.1)
Sodium: 140 mmol/L (ref 135–145)
Total Bilirubin: 0.7 mg/dL (ref 0.3–1.2)
Total Protein: 7.7 g/dL (ref 6.5–8.1)

## 2020-01-30 MED ORDER — IOPAMIDOL (ISOVUE-300) INJECTION 61%
75.0000 mL | Freq: Once | INTRAVENOUS | Status: AC | PRN
  Administered 2020-01-30: 13:00:00 75 mL via INTRAVENOUS

## 2020-02-02 ENCOUNTER — Encounter (HOSPITAL_COMMUNITY): Payer: Self-pay | Admitting: Radiology

## 2020-02-02 ENCOUNTER — Inpatient Hospital Stay: Payer: Medicare PPO | Admitting: Internal Medicine

## 2020-02-02 ENCOUNTER — Encounter: Payer: Self-pay | Admitting: Internal Medicine

## 2020-02-02 ENCOUNTER — Other Ambulatory Visit: Payer: Self-pay

## 2020-02-02 VITALS — BP 163/73 | HR 75 | Temp 97.8°F | Resp 18 | Ht 66.0 in | Wt 201.8 lb

## 2020-02-02 DIAGNOSIS — C3492 Malignant neoplasm of unspecified part of left bronchus or lung: Secondary | ICD-10-CM | POA: Diagnosis not present

## 2020-02-02 DIAGNOSIS — C3412 Malignant neoplasm of upper lobe, left bronchus or lung: Secondary | ICD-10-CM | POA: Diagnosis not present

## 2020-02-02 DIAGNOSIS — C641 Malignant neoplasm of right kidney, except renal pelvis: Secondary | ICD-10-CM | POA: Diagnosis not present

## 2020-02-02 DIAGNOSIS — I10 Essential (primary) hypertension: Secondary | ICD-10-CM

## 2020-02-02 DIAGNOSIS — R590 Localized enlarged lymph nodes: Secondary | ICD-10-CM

## 2020-02-02 DIAGNOSIS — Z85118 Personal history of other malignant neoplasm of bronchus and lung: Secondary | ICD-10-CM

## 2020-02-02 DIAGNOSIS — C3490 Malignant neoplasm of unspecified part of unspecified bronchus or lung: Secondary | ICD-10-CM

## 2020-02-02 DIAGNOSIS — R9389 Abnormal findings on diagnostic imaging of other specified body structures: Secondary | ICD-10-CM | POA: Diagnosis not present

## 2020-02-02 NOTE — Progress Notes (Signed)
Bradley Irwin. Bradley Irwin "Bradley Irwin" Male, 74 y.o., August 21, 1946 MRN:  [id number] Phone:  [phone] (H) PCP:  [name], MD Coverage:  [facility] Medicare/[employer] Medicare Choice Ppo Next Appt With Cardiothoracic Surgery 04/29/2020 at 1:30 PM  RE: [place] Axillary Node Core Biopsy Left Received: Today Message Contents  Burnell Blanks, MD  Garth Bigness D  OK to hold Plavix 5 days before procedure.   [name]       Previous Messages   ----- Message -----  From: Garth Bigness D  Sent: 02/02/2020  3:07 PM EST  To: Burnell Blanks, MD, *  Subject: FW: [place] Axillary Node Core Biopsy Left       Provider requesting Biopsy and patient is on Plavix, will need to hold for 5 days prior to biopsy. Need to know if it is okay to hold patients blood thinner.  Thanks Aniceto Boss  ----- Message -----  From: Garth Bigness D  Sent: 02/02/2020  2:54 PM EST  To: Ir Procedure Requests  Subject: [place] Axillary Node Core Biopsy Left         Procedure:  [place] AXILLARY NODE CORE BIOPSY LEFT   Reason:  Malignant neoplasm of unspecified part of unspecified bronchus or lung, Enlarged bilateral axillary lymphadenopathy on recent CT scan of the chest   History: CT in computer   Provider: Curt Bears   Provider Contact: [phone]

## 2020-02-02 NOTE — Progress Notes (Signed)
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  CURRENT THERAPY: Observation.  INTERVAL HISTORY: Bradley Irwin 74 y.o. male returns to the clinic today for 6 months follow-up visit.  His wife was available by phone.  The patient is feeling fine today with no concerning complaints.  He denied having any chest pain, shortness of breath, cough or hemoptysis.  He denied having any fever or chills.  He has no nausea, vomiting, diarrhea or constipation.  He has no headache or visual changes.  The patient is here today for evaluation with repeat CT scan of the chest for restaging of his disease.  MEDICAL HISTORY: Past Medical History:  Diagnosis Date  . Anxiety   . Coronary artery disease   . Eczema   . GERD (gastroesophageal reflux disease)   . Hearing loss   . Hx of cardiovascular stress test    Lexiscan Myoview 5/16:  The study is normal. No ischemia identified. This is a low risk study. Overall left ventricular systolic function was normal. LV cavity size is normal. The left ventricular ejection fraction is normal (55-65%). Sensitivity reduced by bowel loop attenuation artifact.  . Hyperlipidemia   . Hypertension   . Hypothyroidism   . IBS (irritable bowel syndrome)   . Lung cancer  left upper lobe ([facility]) 06/16/2019  . Psoriasis   . PVD (peripheral vascular disease) ([place] [facility])   . Rash   . Renal cell cancer ([place])   . Wears glasses   . Wears hearing aid    both ears    ALLERGIES:  is allergic to sulfa antibiotics; demerol; and pseudoephedrine.  MEDICATIONS:  Current Outpatient Medications  Medication Sig Dispense Refill  .  acetaminophen (TYLENOL) 500 MG tablet Take 500 mg by mouth every 6 (six) hours as needed for mild pain, fever or headache.     . ALPRAZolam (XANAX) 0.5 MG tablet Take 0.5 mg by mouth 2 (two) times daily as needed for anxiety.     Marland Kitchen amLODipine (NORVASC) 5 MG tablet Take 10 mg by mouth at bedtime.     Marland Kitchen aspirin 81 MG tablet Take 81 mg by mouth daily.      Marland Kitchen atenolol (TENORMIN) 100 MG tablet     . atorvastatin (LIPITOR) 10 MG tablet Take 10 mg by mouth daily.      . clidinium-chlordiazePOXIDE (LIBRAX) 5-2.5 MG capsule Take 2 capsules by mouth daily as needed. Takes for  IBS    . clopidogrel (PLAVIX) 75 MG tablet TAKE 1 TABLET BY MOUTH EVERY DAY 90 tablet 3  . diphenhydrAMINE (BENADRYL) 25 MG tablet Take 25 mg by mouth at bedtime.     . hydrochlorothiazide (HYDRODIURIL) 25 MG tablet     . ibuprofen (ADVIL) 200 MG tablet Take 200-400 mg by mouth every 6 (six) hours as needed.    Marland Kitchen levothyroxine (SYNTHROID) 125 MCG tablet Take 125-250 mcg by mouth See admin instructions. Take 125 mcg by mouth Mon-Sat and on Sunday take 250 mcg  3  . omeprazole (PRILOSEC) 20 MG capsule Take 20 mg by mouth 2 (two) times daily.     Marland Kitchen  simethicone (MYLICON) 264 MG chewable tablet Chew 125 mg by mouth every 6 (six) hours as needed for flatulence.    . valACYclovir (VALTREX) 500 MG tablet Take 500 mg by mouth 2 (two) times daily as needed (fever blisters).   0   No current facility-administered medications for this visit.    SURGICAL HISTORY:  Past Surgical History:  Procedure Laterality Date  . CARDIAC CATHETERIZATION    . CHOLECYSTECTOMY  10/26/11  . COLONOSCOPY    . CORONARY STENT PLACEMENT  2006  . FINGER SURGERY  74 y/o   cut index rt  . INTERCOSTAL NERVE BLOCK  06/16/2019   Procedure: Intercostal Nerve Block;  Surgeon: Grace Isaac, MD;  Location: [facility];  Service: Thoracic;;  . LIPOMA EXCISION Left 11/18/2013   Procedure: EXCISION POSTERIOR NECK LIPOMA ;  Surgeon: Stark Klein, MD;  Location: [facility];  Service: General;  Laterality: Left;  . LIVER BIOPSY  09/18/11  . LIVER LOBECTOMY  10/26/11  . LYMPH NODE DISSECTION  06/16/2019   Procedure: Lymph Node Dissection;  Surgeon: Grace Isaac, MD;  Location: [facility];  Service: Thoracic;;  . NEPHRECTOMY  2008   right  . TONSILLECTOMY    . VIDEO ASSISTED THORACOSCOPY (VATS)/WEDGE RESECTION Left 06/16/2019   Procedure: VIDEO ASSISTED THORACOSCOPY (VATS)/MINI THOROCOTOMY WITH LEFT UPPER LOBE WEDGE RESECTION;  Surgeon: Grace Isaac, MD;  Location: [facility];  Service: Thoracic;  Laterality: Left;  Marland Kitchen VIDEO BRONCHOSCOPY N/A 06/16/2019   Procedure: VIDEO BRONCHOSCOPY;  Surgeon: Grace Isaac, MD;  [facility];  Service: Thoracic;  Laterality: N/A;    REVIEW OF SYSTEMS:  Constitutional: negative Eyes: negative Ears, nose, mouth, throat, and face: negative Respiratory: negative Cardiovascular: negative Gastrointestinal: negative Genitourinary:negative Integument/breast: negative Hematologic/lymphatic: negative Musculoskeletal:negative Neurological: negative Behavioral/Psych: negative Endocrine: negative Allergic/Immunologic: negative   PHYSICAL EXAMINATION: General appearance: alert, cooperative and no distress Head: Normocephalic, without obvious abnormality, atraumatic Neck: no adenopathy, no JVD, supple, symmetrical, trachea midline and thyroid not enlarged, symmetric, no tenderness/mass/nodules Lymph nodes: Cervical, supraclavicular, and axillary nodes normal. Resp: clear to auscultation bilaterally Back: symmetric, no curvature. ROM normal. No CVA tenderness. Cardio: regular rate and rhythm, S1, S2 normal, no murmur, click, rub or gallop GI: soft, non-tender; bowel sounds normal; no masses,  no organomegaly Extremities: extremities normal, atraumatic, no cyanosis or edema Neurologic: Alert and oriented X 3, normal strength and tone. Normal symmetric reflexes. Normal coordination and gait  ECOG PERFORMANCE  STATUS: 1 - Symptomatic but completely ambulatory  Blood pressure (!) 163/73, pulse 75, temperature 97.8 F (36.6 C), temperature source Temporal, resp. rate 18, height '5\' 6"'  (1.676 m), weight 201 lb 12.8 oz (91.5 kg), SpO2 100 %.  LABORATORY DATA: Lab Results  Component Value Date   WBC 11.3 (H) 01/30/2020   HGB 12.2 (L) 01/30/2020   HCT 41.0 01/30/2020   MCV 76.8 (L) 01/30/2020   PLT 259 01/30/2020      Chemistry      Component Value Date/Time   NA 140 01/30/2020 1045   K 3.4 (L) 01/30/2020 1045   CL 102 01/30/2020 1045   CO2 26 01/30/2020 1045   BUN 24 (H) 01/30/2020 1045   CREATININE 1.61 (H) 01/30/2020 1045   CREATININE 1.30 (H) 02/06/2016 1714      Component Value Date/Time   CALCIUM 9.6 01/30/2020 1045   ALKPHOS 105 01/30/2020 1045   AST 29 01/30/2020 1045   ALT 37 01/30/2020 1045   BILITOT 0.7 01/30/2020 1045  RADIOGRAPHIC STUDIES: CT Chest W Contrast  Result Date: 01/30/2020 CLINICAL DATA:  Follow-up restaging non-small cell lung cancer history of VATS and left wedge resection, also history of right renal cell carcinoma status post left nephrectomy. EXAM: CT CHEST WITH CONTRAST TECHNIQUE: Multidetector CT imaging of the chest was performed during intravenous contrast administration. CONTRAST:  57m ISOVUE-300 IOPAMIDOL (ISOVUE-300) INJECTION 61% COMPARISON:  03/05/2019 FINDINGS: Cardiovascular: Aortic caliber is normal signs of noncalcified atherosclerotic plaque. Signs of coronary artery disease. Heart size mildly enlarged. No pericardial effusion. Central pulmonary vasculature is normal. Mediastinum/Nodes: Bulky axillary nodal enlargement with worsening over a series of multiple prior exams most notably these nodes are larger on today's study compared to previous exams. (Image 33, series 2) 1.6 cm left axillary lymph node previously approximately 1.2 cm. Also lacks fatty hilum on today's exam. (Image 33, series 2) right axillary lymph node 1.6 cm short axis  previously 1.2 cm. (Image 60, series 2) left axillary lymph node without fatty hilum previously approximately 6 mm now 1.9 cm. Lymph nodes track into the subpectoral regions bilaterally with a left retropectoral lymph node at 1.4 cm as an example with smaller nodes tracking to the infraclavicular areas bilaterally. Mediastinal nodal enlargement is not as pronounced with the exception of a right hilar lymph nodes that measures 1.9 cm previously 1.6 cm (image 70, series 2 other lymph nodes in the mediastinum remain mildly enlarged. Lungs/Pleura: Signs of basilar atelectasis. Postoperative changes of wedge resection in the left upper lobe. No signs of consolidation or pleural effusion. Airways are patent. Subtle ground-glass nodule that was present in the right upper lobe on the prior study has an oblong morphology measuring 10 by 6 mm showing increased density as compared to the previous exam size on the prior study is similar, density has increased. Signs of scarring in the right middle lobe. Mild thickening along suture lines in the left upper chest, study will serve as a baseline for future follow-up. Upper Abdomen: Lobulated low-density liver, no focal lesion within imaged portions. The adrenal glands are normal. Low-density splenic lesion is stable but incompletely imaged. Changes related to nephrectomy are not visualized. Musculoskeletal: No chest wall mass. No signs of acute bone finding. No signs of destructive bone process. IMPRESSION: 1. Bulky axillary and subpectoral nodal enlargement with worsening over a series of multiple prior exams. 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 2. Subtle ground-glass nodule right upper lobe has increased in density since the prior study. Additional site of disease is considered given the increase in density. 3. Hepatic steatosis. Aortic Atherosclerosis (ICD10-I70.0). Electronically Signed   By: GZetta BillsM.D.   On: 01/30/2020 14:06    ASSESSMENT AND PLAN: This is a very pleasant 74years old white male with stage Ia non-small cell lung cancer, adenocarcinoma with positive MET 14 splice mutation status post wedge resection of the left upper lobe with lymph node sampling in June 2020. The patient has been on observation since that time and he is feeling fine. Repeat CT scan of the chest performed recently showed bulky axillary and subpectoral lymphadenopathy concerning for progression of an indolent lymphoproliferative disorder versus metastatic disease. I personally and independently reviewed the scans and discussed the results with the patient and his wife. I recommended for the patient to have ultrasound-guided core biopsy of the left axillary lymph node for confirmation of tissue diagnosis. I will see the patient back for follow-up visit in 6 months with repeat CT scan  of the chest or sooner if the biopsy showed any concerning findings for malignancy. For the hypertension I strongly encouraged the patient to continue with his blood pressure medication and to monitor it closely at home and discuss with his primary care physician change of medication if needed. He was advised to call immediately if he has any concerning symptoms in the interval. The patient voices understanding of current disease status and treatment options and is in agreement with the current care plan.  All questions were answered. The patient knows to call the clinic with any problems, questions or concerns. We can certainly see the patient much sooner if necessary.  Disclaimer: This note was dictated with voice recognition software. Similar sounding words can inadvertently be transcribed and may not be corrected upon review.

## 2020-02-02 NOTE — Progress Notes (Signed)
Rexanne Mano. Shon Baton "Colten Desroches" Male, 74 y.o., 1946/05/27 MRN:  [id number] Phone:  [phone] (H) PCP:  [name], MD Coverage:  [facility] Medicare/[employer] Medicare Choice Ppo Next Appt With Cardiothoracic Surgery 04/29/2020 at 1:30 PM  RE: [place] Axillary Node Core Biopsy Left Received: Today Message Contents  Sandi Mariscal, MD  Garth Bigness D  US guided L axillary LN Bx.   Chest CT - image 33, series 2.   Sedation per pt request.   Cathren Harsh       Previous Messages   ----- Message -----  From: Garth Bigness D  Sent: 02/02/2020  2:54 PM EST  To: Ir Procedure Requests  Subject: [place] Axillary Node Core Biopsy Left         Procedure:  [place] AXILLARY NODE CORE BIOPSY LEFT   Reason:  Malignant neoplasm of unspecified part of unspecified bronchus or lung, Enlarged bilateral axillary lymphadenopathy on recent CT scan of the chest   History: CT in computer   Provider: Curt Bears   Provider Contact: [phone]

## 2020-02-04 ENCOUNTER — Telehealth: Payer: Self-pay | Admitting: Internal Medicine

## 2020-02-04 NOTE — Telephone Encounter (Signed)
Scheduled per los. Called and left msg. Mailed printout  °

## 2020-02-06 ENCOUNTER — Other Ambulatory Visit: Payer: Self-pay | Admitting: Radiology

## 2020-02-09 ENCOUNTER — Other Ambulatory Visit: Payer: Self-pay

## 2020-02-09 ENCOUNTER — Ambulatory Visit (HOSPITAL_COMMUNITY)
Admission: RE | Admit: 2020-02-09 | Discharge: 2020-02-09 | Disposition: A | Discharge status: Home / Self Care | Payer: Medicare PPO | Source: Ambulatory Visit | Attending: Internal Medicine | Admitting: Internal Medicine

## 2020-02-09 ENCOUNTER — Encounter (HOSPITAL_COMMUNITY): Payer: Self-pay

## 2020-02-09 DIAGNOSIS — K219 Gastro-esophageal reflux disease without esophagitis: Secondary | ICD-10-CM | POA: Insufficient documentation

## 2020-02-09 DIAGNOSIS — E039 Hypothyroidism, unspecified: Secondary | ICD-10-CM | POA: Insufficient documentation

## 2020-02-09 DIAGNOSIS — Z885 Allergy status to narcotic agent status: Secondary | ICD-10-CM | POA: Diagnosis not present

## 2020-02-09 DIAGNOSIS — I2510 Atherosclerotic heart disease of native coronary artery without angina pectoris: Secondary | ICD-10-CM | POA: Diagnosis not present

## 2020-02-09 DIAGNOSIS — C3412 Malignant neoplasm of upper lobe, left bronchus or lung: Secondary | ICD-10-CM | POA: Insufficient documentation

## 2020-02-09 DIAGNOSIS — K589 Irritable bowel syndrome without diarrhea: Secondary | ICD-10-CM | POA: Insufficient documentation

## 2020-02-09 DIAGNOSIS — Z79890 Hormone replacement therapy (postmenopausal): Secondary | ICD-10-CM | POA: Diagnosis not present

## 2020-02-09 DIAGNOSIS — I739 Peripheral vascular disease, unspecified: Secondary | ICD-10-CM | POA: Insufficient documentation

## 2020-02-09 DIAGNOSIS — Z7982 Long term (current) use of aspirin: Secondary | ICD-10-CM | POA: Insufficient documentation

## 2020-02-09 DIAGNOSIS — Z905 Acquired absence of kidney: Secondary | ICD-10-CM | POA: Diagnosis not present

## 2020-02-09 DIAGNOSIS — R590 Localized enlarged lymph nodes: Secondary | ICD-10-CM | POA: Diagnosis not present

## 2020-02-09 DIAGNOSIS — Z888 Allergy status to other drugs, medicaments and biological substances status: Secondary | ICD-10-CM | POA: Diagnosis not present

## 2020-02-09 DIAGNOSIS — Z882 Allergy status to sulfonamides status: Secondary | ICD-10-CM | POA: Diagnosis not present

## 2020-02-09 DIAGNOSIS — C641 Malignant neoplasm of right kidney, except renal pelvis: Secondary | ICD-10-CM | POA: Diagnosis not present

## 2020-02-09 DIAGNOSIS — K760 Fatty (change of) liver, not elsewhere classified: Secondary | ICD-10-CM | POA: Insufficient documentation

## 2020-02-09 DIAGNOSIS — E785 Hyperlipidemia, unspecified: Secondary | ICD-10-CM | POA: Diagnosis not present

## 2020-02-09 DIAGNOSIS — C3490 Malignant neoplasm of unspecified part of unspecified bronchus or lung: Secondary | ICD-10-CM

## 2020-02-09 DIAGNOSIS — Z79899 Other long term (current) drug therapy: Secondary | ICD-10-CM | POA: Insufficient documentation

## 2020-02-09 DIAGNOSIS — Z7902 Long term (current) use of antithrombotics/antiplatelets: Secondary | ICD-10-CM | POA: Diagnosis not present

## 2020-02-09 DIAGNOSIS — F419 Anxiety disorder, unspecified: Secondary | ICD-10-CM | POA: Insufficient documentation

## 2020-02-09 DIAGNOSIS — I10 Essential (primary) hypertension: Secondary | ICD-10-CM | POA: Diagnosis not present

## 2020-02-09 DIAGNOSIS — Z9049 Acquired absence of other specified parts of digestive tract: Secondary | ICD-10-CM | POA: Diagnosis not present

## 2020-02-09 DIAGNOSIS — C3492 Malignant neoplasm of unspecified part of left bronchus or lung: Secondary | ICD-10-CM | POA: Diagnosis present

## 2020-02-09 LAB — PROTIME-INR
INR: 0.9 (ref 0.8–1.2)
Prothrombin Time: 12.3 seconds (ref 11.4–15.2)

## 2020-02-09 LAB — CBC
HCT: 42.4 % (ref 39.0–52.0)
Hemoglobin: 12.6 g/dL — ABNORMAL LOW (ref 13.0–17.0)
MCH: 23 pg — ABNORMAL LOW (ref 26.0–34.0)
MCHC: 29.7 g/dL — ABNORMAL LOW (ref 30.0–36.0)
MCV: 77.2 fL — ABNORMAL LOW (ref 80.0–100.0)
Platelets: 240 10*3/uL (ref 150–400)
RBC: 5.49 MIL/uL (ref 4.22–5.81)
RDW: 17.4 % — ABNORMAL HIGH (ref 11.5–15.5)
WBC: 12.2 10*3/uL — ABNORMAL HIGH (ref 4.0–10.5)
nRBC: 0 % (ref 0.0–0.2)

## 2020-02-09 MED ORDER — MIDAZOLAM HCL 2 MG/2ML IJ SOLN
INTRAMUSCULAR | Status: AC
  Filled 2020-02-09: qty 2

## 2020-02-09 MED ORDER — MIDAZOLAM HCL 2 MG/2ML IJ SOLN
INTRAMUSCULAR | Status: AC | PRN
  Administered 2020-02-09 (×2): 1 mg via INTRAVENOUS

## 2020-02-09 MED ORDER — SODIUM CHLORIDE 0.9 % IV SOLN: INTRAVENOUS | Status: DC

## 2020-02-09 MED ORDER — FENTANYL CITRATE (PF) 100 MCG/2ML IJ SOLN
INTRAMUSCULAR | Status: AC
  Filled 2020-02-09: qty 2

## 2020-02-09 MED ORDER — LIDOCAINE-EPINEPHRINE (PF) 2 %-1:200000 IJ SOLN
INTRAMUSCULAR | Status: AC
  Filled 2020-02-09: qty 20

## 2020-02-09 MED ORDER — FENTANYL CITRATE (PF) 100 MCG/2ML IJ SOLN
INTRAMUSCULAR | Status: AC | PRN
  Administered 2020-02-09: 50 ug via INTRAVENOUS

## 2020-02-09 MED ORDER — LIDOCAINE-EPINEPHRINE (PF) 1 %-1:200000 IJ SOLN
INTRAMUSCULAR | Status: AC | PRN
  Administered 2020-02-09: 5 mL

## 2020-02-09 NOTE — Procedures (Signed)
Pre Procedure Dx: Axillary lympadenopathy Post Procedural Dx: Same  Technically successful US guided biopsy of left axillary lymph node.  EBL: None  No immediate complications.   Ronny Bacon, MD Pager #: [phone]

## 2020-02-09 NOTE — H&P (Signed)
Referring Physician(s): Mohamed,Mohamed  Supervising Physician: Sandi Mariscal  Patient Status:  [facility] OP  Chief Complaint:  "I'm here for a biopsy"  Subjective: Patient familiar to IR service from right renal biopsy in 2007 which revealed renal cell carcinoma; underwent right nephrectomy in 2008.  He also underwent right liver lesion biopsy in 2012 with subsequent laparoscopic partial hepatectomy 10/26/2011 revealing focal nodular hyperplasia.  He also has a history of left lung adenocarcinoma with prior left upper lobe wedge resection/lymph node sampling in June of last year.  Follow-up CT scan performed on 01/30/2020 revealed: 1. Bulky axillary and subpectoral nodal enlargement with worsening over a series of multiple prior exams. 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 2. Subtle ground-glass nodule right upper lobe has increased in density since the prior study. Additional site of disease is considered given the increase in density. 3. Hepatic steatosis  He presents today for image guided left axillary lymph node biopsy for further evaluation.  He currently denies fever, headache, chest pain, dyspnea, cough, abdominal/back pain, nausea, vomiting or bleeding.  He does have occasional reflux.  Additional medical history as below.  Past Medical History:  Diagnosis Date  . Anxiety   . Coronary artery disease   . Eczema   . GERD (gastroesophageal reflux disease)   . Hearing loss   . Hx of cardiovascular stress test    Lexiscan Myoview 5/16:  The study is normal. No ischemia identified. This is a low risk study. Overall left ventricular systolic function was normal. LV cavity size is normal. The left ventricular ejection fraction is normal (55-65%). Sensitivity reduced by bowel loop attenuation artifact.  . Hyperlipidemia   . Hypertension   . Hypothyroidism   . IBS (irritable bowel syndrome)   . Lung cancer  left  upper lobe ([place]) 06/16/2019  . Psoriasis   . PVD (peripheral vascular disease) ([facility])   . Rash   . Renal cell cancer ([place])   . Wears glasses   . Wears hearing aid    both ears   Past Surgical History:  Procedure Laterality Date  . CARDIAC CATHETERIZATION    . CHOLECYSTECTOMY  10/26/11  . COLONOSCOPY    . CORONARY STENT PLACEMENT  2006  . FINGER SURGERY  74 y/o   cut index rt  . INTERCOSTAL NERVE BLOCK  06/16/2019   Procedure: Intercostal Nerve Block;  Surgeon: Grace Isaac, MD;  Location: [place];  Service: Thoracic;;  . LIPOMA EXCISION Left 11/18/2013   Procedure: EXCISION POSTERIOR NECK LIPOMA ;  Surgeon: Stark Klein, MD;  Location: [place];  Service: General;  Laterality: Left;  . LIVER BIOPSY  09/18/11  . LIVER LOBECTOMY  10/26/11  . LYMPH NODE DISSECTION  06/16/2019   Procedure: Lymph Node Dissection;  Surgeon: Grace Isaac, MD;  Location: [place];  Service: Thoracic;;  . NEPHRECTOMY  2008   right  . TONSILLECTOMY    . VIDEO ASSISTED THORACOSCOPY (VATS)/WEDGE RESECTION Left 06/16/2019   Procedure: VIDEO ASSISTED THORACOSCOPY (VATS)/MINI THOROCOTOMY WITH LEFT UPPER LOBE WEDGE RESECTION;  Surgeon: Grace Isaac, MD;  Location: [place];  Service: Thoracic;  Laterality: Left;  Marland Kitchen VIDEO BRONCHOSCOPY N/A 06/16/2019   Procedure: VIDEO BRONCHOSCOPY;  Surgeon: Grace Isaac, MD;  Location: [facility] OR;  Service: Thoracic;  Laterality: N/A;       Allergies: Sulfa antibiotics, Demerol, and Pseudoephedrine  Medications: Prior to Admission medications   Medication Sig Start Date End Date  Taking? Authorizing Provider  acetaminophen (TYLENOL) 500 MG tablet Take 500 mg by mouth every 6 (six) hours as needed for mild pain, fever or headache.     [provider]  ALPRAZolam Duanne Moron) 0.5 MG tablet Take 0.5 mg by mouth 2 (two) times daily as needed for anxiety.     [provider]  amLODipine (NORVASC) 5 MG tablet Take 10 mg by mouth at bedtime.      [provider]  aspirin 81 MG tablet Take 81 mg by mouth daily.      [provider]  atenolol (TENORMIN) 100 MG tablet  05/01/19   [provider]  atorvastatin (LIPITOR) 10 MG tablet Take 10 mg by mouth daily.      [provider]  clidinium-chlordiazePOXIDE (LIBRAX) 5-2.5 MG capsule Take 2 capsules by mouth daily as needed. Takes for  IBS    [provider]  clopidogrel (PLAVIX) 75 MG tablet TAKE 1 TABLET BY MOUTH EVERY DAY 07/07/19   Burnell Blanks, MD  diphenhydrAMINE (BENADRYL) 25 MG tablet Take 25 mg by mouth at bedtime.     [provider]  hydrochlorothiazide (HYDRODIURIL) 25 MG tablet  05/18/19   [provider]  ibuprofen (ADVIL) 200 MG tablet Take 200-400 mg by mouth every 6 (six) hours as needed.    [provider]  levothyroxine (SYNTHROID) 125 MCG tablet Take 125-250 mcg by mouth See admin instructions. Take 125 mcg by mouth Mon-Sat and on Sunday take 250 mcg 04/25/16   [provider]  omeprazole (PRILOSEC) 20 MG capsule Take 20 mg by mouth 2 (two) times daily.     [provider]  simethicone (MYLICON) 829 MG chewable tablet Chew 125 mg by mouth every 6 (six) hours as needed for flatulence.    [provider]  valACYclovir (VALTREX) 500 MG tablet Take 500 mg by mouth 2 (two) times daily as needed (fever blisters).  12/22/14   [provider]     Vital Signs: Blood pressure 148/76, heart rate 71, respirations 18, temperature 99, O2 sat 100% room air   Physical Exam awake, alert.  Chest clear to auscultation bilaterally.  Heart with regular rate and rhythm.  Abdomen soft, positive bowel sounds, nontender.  No lower extremity edema.  Imaging: No results found.  Labs:  CBC: Recent Labs    06/23/19 0659 07/29/19 1359 01/30/20 1045 02/09/20 1205  WBC 9.3 8.3 11.3* 12.2*  HGB 12.8* 12.1* 12.2* 12.6*  HCT 39.7 38.2* 41.0 42.4  PLT 217 261 259 240     COAGS: Recent Labs    06/12/19 1245  INR 1.0  APTT 26    BMP: Recent Labs    06/19/19 0249 06/23/19 0659 07/29/19 1359 01/30/20 1045  NA 141 140 141 140  K 3.5 3.7 4.3 3.4*  CL 107 105 106 102  CO2 25 26 27 26   GLUCOSE 107* 108* 106* 129*  BUN 12 9 19  24*  CALCIUM 8.8* 9.9 10.3 9.6  CREATININE 1.26* 1.24 1.41* 1.61*  GFRNONAA 57* 58* 50* 42*  GFRAA >60 >60 58* 49*    LIVER FUNCTION TESTS: Recent Labs    06/18/19 0358 06/19/19 0249 07/29/19 1359 01/30/20 1045  BILITOT 1.0 0.7 0.6 0.7  AST 153* 122* 29 29  ALT 74* 104* 36 37  ALKPHOS 127* 178* 140* 105  PROT 5.8* 5.6* 7.0 7.7  ALBUMIN 3.2* 2.9* 3.9 4.3    Assessment and Plan: Patient with past history of right renal cell  carcinoma with prior nephrectomy in 2012; recently diagnosed with left lung adenocarcinoma in June of last year, status post left upper lobe wedge resection/lymph node sampling; recent follow-up CT has revealed: 1. Bulky axillary and subpectoral nodal enlargement with worsening over a series of multiple prior exams. 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 2. Subtle ground-glass nodule right upper lobe has increased in density since the prior study. Additional site of disease is considered given the increase in density. 3. Hepatic steatosis  Patient scheduled today for image guided left axillary lymph node biopsy for further evaluation.Risks and benefits of procedure was discussed with the patient  including, but not limited to bleeding, infection, damage to adjacent structures or low yield requiring additional tests.  All of the questions were answered and there is agreement to proceed.  Consent signed and in chart.      Electronically Signed: D. Rowe Robert, PA-C 02/09/2020, 12:23 PM   I spent a total of 25 minutes at the the patient's bedside AND on the patient's hospital floor or unit, greater than 50% of  which was counseling/coordinating care for image guided left axillary lymph node biopsy

## 2020-02-09 NOTE — Discharge Instructions (Signed)

## 2020-02-10 ENCOUNTER — Other Ambulatory Visit: Payer: Self-pay

## 2020-02-11 LAB — SURGICAL PATHOLOGY

## 2020-02-17 ENCOUNTER — Telehealth: Payer: Self-pay

## 2020-02-17 ENCOUNTER — Other Ambulatory Visit: Payer: Self-pay | Admitting: Internal Medicine

## 2020-02-17 DIAGNOSIS — C3490 Malignant neoplasm of unspecified part of unspecified bronchus or lung: Secondary | ICD-10-CM

## 2020-02-17 NOTE — Telephone Encounter (Signed)
Done

## 2020-02-17 NOTE — Telephone Encounter (Signed)
Request Biopsy Results: Patient would like to know the results from his Biopsy done on 2/15. Next appt. August.  Patient prefers [facility] for his scans.

## 2020-02-19 ENCOUNTER — Ambulatory Visit: Payer: Medicare PPO | Attending: Internal Medicine

## 2020-02-19 DIAGNOSIS — Z23 Encounter for immunization: Secondary | ICD-10-CM | POA: Insufficient documentation

## 2020-02-19 NOTE — Progress Notes (Signed)
   Covid-19 Vaccination Clinic  Name:  CORI HENNINGSEN    MRN: [id number] DOB: 1946-02-02  02/19/2020  Mr. Fotheringham was observed post Covid-19 immunization for 15 minutes without incidence. He was provided with Vaccine Information Sheet and instruction to access the V-Safe system.   Mr. Armor was instructed to call 911 with any severe reactions post vaccine: Marland Kitchen Difficulty breathing  . Swelling of your face and throat  . A fast heartbeat  . A bad rash all over your body  . Dizziness and weakness    Immunizations Administered    Name Date Dose VIS Date Route   Pfizer COVID-19 Vaccine 02/19/2020 10:55 AM 0.3 mL 12/05/2019 Intramuscular   Manufacturer: [place]   Lot: [id number]   [place]: [id number]

## 2020-02-20 ENCOUNTER — Telehealth: Payer: Self-pay | Admitting: *Deleted

## 2020-02-20 NOTE — Telephone Encounter (Signed)
Received call from pt this am asking about biopsy results. He says he can see them in MyChart & would like some explanation.  Discussed CLL.  Informed that we would have Dr Julien Nordmann review & get back with him.  He also would like to know if he needs a CT & earlier f/u.  Message routed to Dr Julien Nordmann.

## 2020-02-23 ENCOUNTER — Encounter: Payer: Self-pay | Admitting: Internal Medicine

## 2020-02-24 ENCOUNTER — Encounter: Payer: Self-pay | Admitting: Medical Oncology

## 2020-03-16 ENCOUNTER — Ambulatory Visit: Payer: Medicare PPO | Attending: Internal Medicine

## 2020-03-16 DIAGNOSIS — Z23 Encounter for immunization: Secondary | ICD-10-CM

## 2020-03-16 NOTE — Progress Notes (Signed)
   Covid-19 Vaccination Clinic  Name:  Bradley Irwin    MRN: [id number] DOB: Apr 18, 1946  03/16/2020  [name] was observed post Covid-19 immunization for 15 minutes without incident. He was provided with Vaccine Information Sheet and instruction to access the V-Safe system.   [name] was instructed to call 911 with any severe reactions post vaccine: Marland Kitchen Difficulty breathing  . Swelling of face and throat  . A fast heartbeat  . A bad rash all over body  . Dizziness and weakness   Immunizations Administered    Name Date Dose VIS Date Route   Pfizer COVID-19 Vaccine 03/16/2020 11:09 AM 0.3 mL 12/05/2019 Intramuscular   Manufacturer: [place]   Lot: [id number]   [place]: [id number]

## 2020-04-21 ENCOUNTER — Ambulatory Visit: Payer: Medicare PPO | Admitting: Dermatology

## 2020-04-21 ENCOUNTER — Other Ambulatory Visit: Payer: Self-pay

## 2020-04-21 DIAGNOSIS — B354 Tinea corporis: Secondary | ICD-10-CM | POA: Diagnosis not present

## 2020-04-21 DIAGNOSIS — I872 Venous insufficiency (chronic) (peripheral): Secondary | ICD-10-CM

## 2020-04-21 DIAGNOSIS — Z1283 Encounter for screening for malignant neoplasm of skin: Secondary | ICD-10-CM | POA: Diagnosis not present

## 2020-04-21 DIAGNOSIS — L821 Other seborrheic keratosis: Secondary | ICD-10-CM | POA: Diagnosis not present

## 2020-04-21 LAB — POCT SKIN KOH: Skin KOH, POC: NEGATIVE

## 2020-04-21 MED ORDER — BETAMETHASONE DIPROPIONATE AUG 0.05 % EX CREA: TOPICAL_CREAM | Freq: Two times a day (BID) | CUTANEOUS | 1 refills | Status: DC

## 2020-04-21 NOTE — Progress Notes (Signed)
   Follow-Up Visit   Subjective  Bradley Irwin is a 74 y.o. male who presents for the following: Annual Exam (Concerns patient will get some rashes on legs that itch and come and go. He wants something to help for that. ).  Rash Location: Legs Duration: Months Quality: Spreading Associated Signs/Symptoms: Itch Modifying Factors:  Severity:  Timing: Context:   The following portions of the chart were reviewed this encounter and updated as appropriate:     Objective  Well appearing patient in no apparent distress; mood and affect are within normal limits.  A full examination was performed including scalp, head, eyes, ears, nose, lips, neck, chest, axillae, abdomen, back, buttocks, bilateral upper extremities, bilateral lower extremities, hands, feet, fingers, toes, fingernails, and toenails. All findings within normal limits unless otherwise noted below. Discussed several skin problems today with Renette Butters.  He is world class grower of rough keratoses including tiny ones called acrokeratoses on his feet and larger seborrheic keratoses mainly on the torso and around the neck.  He is reassured that these are safe to leave forever.  He has 3 distinct rashes on his legs.  On the left upper leg is an oval 1.5 cm solid patch that is psoriasis-like.  A larger oval on the right outer calf is clinically between nummular eczema and psoriasis, but the lower edge has a definite scaly round border like tinea.  KOH was negative and fungal culture obtained.  Finally on the left lower leg he has a subtle discoloration and thickening of his skin suggestive of stasis dermatitis.  We talked about ways to minimize venous pressure by either walking or when he is resting to have the left leg elevated.  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  He will try a pramoxine containing cream like Goldbond antiitch or CeraVe itch relief.  Assessment & Plan  Topical  betamethasone pending fungal culture

## 2020-04-26 ENCOUNTER — Encounter: Payer: Self-pay | Admitting: Dermatology

## 2020-04-28 NOTE — Progress Notes (Signed)
[address]       [place] [address]             [id number]                  [facility] [place] Medical Record #[id number] Date of Birth: 1946/07/31  Referring XF:GHWEX, Christean Grief, MD Primary Cardiology: Primary Care:[name], MD  Chief Complaint:  Follow Up Visit OPERATIVE REPORT DATE OF PROCEDURE:  06/16/2019 PREOPERATIVE DIAGNOSIS:  Left upper lobe lung nodule. POSTOPERATIVE DIAGNOSIS:  Adenocarcinoma of the lung. PROCEDURES PERFORMED:  Bronchoscopy, left video-assisted thoracoscopy, mini thoracotomy, wedge resection, left upper lobe with lymph node dissection and intercostal nerve block.  Cancer Staging Lung cancer  left upper lobe [facility]) Staging form: Lung, AJCC 8th Edition - Pathologic stage from 06/17/2019: Stage IA2 (pT1b, pN0, cM0) - Signed by Grace Isaac, MD on 06/17/2019 with positive MET exon 14splicesitemutation as well as PDL 1 expression of 90% diagnosed in June 2020   Recent axillary node bx: done   History of Present Illness:     Patient notes that he feels well.  He denies shortness of breath.  In February of this year he had a needle biopsy of left axillary lymph nodes that were slowly enlarging-   Zubrod Score: At the time of surgery this patient's most appropriate activity status/level should be described as: '[]'     0    Normal activity, no symptoms '[x]'     1    Restricted in physical strenuous activity but ambulatory, able to do out light work '[]'     2    Ambulatory and capable of self care, unable to do work activities, up and about                 >50 % of waking hours                                                                                   '[]'     3    Only limited self care, in bed greater than 50% of waking hours '[]'     4    Completely disabled, no self care, confined to bed or chair '[]'     5    Moribund  Social History   Tobacco Use  Smoking Status Former Smoker  . Packs/day: 1.00  . Years:  15.00  . Pack years: 15.00  . Types: Cigarettes  . Quit date: 10/26/1983  . Years since quitting: 36.5  Smokeless Tobacco Never Used       Allergies  Allergen Reactions  . Sulfa Antibiotics Other (See Comments)    Happened when he was child. Does not remember reaction.  . Demerol Other (See Comments)    Severe headache.  . Pseudoephedrine Other (See Comments)    Hypertension     Current Outpatient Medications  Medication Sig Dispense Refill  . acetaminophen (TYLENOL) 500 MG tablet Take 500 mg by mouth every 6 (six) hours as needed for mild pain, fever or headache.     . ALPRAZolam (XANAX) 0.5 MG tablet Take 0.5 mg by mouth 2 (two) times daily as needed  for anxiety.     Marland Kitchen amLODipine (NORVASC) 10 MG tablet Take 10 mg by mouth daily.     Marland Kitchen aspirin 81 MG tablet Take 81 mg by mouth daily.      Marland Kitchen atenolol (TENORMIN) 100 MG tablet     . atorvastatin (LIPITOR) 10 MG tablet Take 10 mg by mouth daily.      Marland Kitchen augmented betamethasone dipropionate (DIPROLENE-AF) 0.05 % cream Apply topically 2 (two) times daily. 30 g 1  . clidinium-chlordiazePOXIDE (LIBRAX) 5-2.5 MG capsule Take 2 capsules by mouth daily as needed. Takes for  IBS    . clopidogrel (PLAVIX) 75 MG tablet TAKE 1 TABLET BY MOUTH EVERY DAY 90 tablet 3  . dicyclomine (BENTYL) 20 MG tablet     . diphenhydrAMINE (BENADRYL) 25 MG tablet Take 25 mg by mouth at bedtime.     . hydrochlorothiazide (HYDRODIURIL) 25 MG tablet     . ibuprofen (ADVIL) 200 MG tablet Take 200-400 mg by mouth every 6 (six) hours as needed.    Marland Kitchen levothyroxine (SYNTHROID) 125 MCG tablet Take 125-250 mcg by mouth See admin instructions. Take 125 mcg by mouth Mon-Sat and on Sunday take 250 mcg  3  . omeprazole (PRILOSEC) 20 MG capsule Take 20 mg by mouth 2 (two) times daily.     . simethicone (MYLICON) 354 MG chewable tablet Chew 125 mg by mouth every 6 (six) hours as needed for flatulence.    . valACYclovir (VALTREX) 500 MG tablet Take 500 mg by mouth 2 (two) times  daily as needed (fever blisters).   0   No current facility-administered medications for this visit.       Physical Exam: BP (!) 160/79   Pulse 77   Temp 97.9 F (36.6 C) (Skin)   Resp 20   Ht '5\' 6"'  (1.676 m)   Wt 202 lb (91.6 kg)   SpO2 93% Comment: RA  BMI 32.60 kg/m   General appearance: alert, cooperative and no distress Head: Normocephalic, without obvious abnormality, atraumatic Neck: no adenopathy, no carotid bruit, no JVD, supple, symmetrical, trachea midline and thyroid not enlarged, symmetric, no tenderness/mass/nodules Lymph nodes: Cervical, supraclavicular nodes normal, some fullness in both axilla left greater than right Resp: clear to auscultation bilaterally Cardio: regular rate and rhythm, S1, S2 normal, no murmur, click, rub or gallop GI: soft, non-tender; bowel sounds normal; no masses,  no organomegaly Extremities: extremities normal, atraumatic, no cyanosis or edema and Homans sign is negative, no sign of DVT Neurologic: Grossly normal  Diagnostic Studies & Laboratory data:         Recent Radiology Findings: CLINICAL DATA:  Follow-up restaging non-small cell lung cancer history of VATS and left wedge resection, also history of right renal cell carcinoma status post left nephrectomy.  EXAM: CT CHEST WITH CONTRAST  TECHNIQUE: Multidetector CT imaging of the chest was performed during intravenous contrast administration.  CONTRAST:  52m ISOVUE-300 IOPAMIDOL (ISOVUE-300) INJECTION 61%  COMPARISON:  03/05/2019  FINDINGS: Cardiovascular: Aortic caliber is normal signs of noncalcified atherosclerotic plaque. Signs of coronary artery disease. Heart size mildly enlarged. No pericardial effusion. Central pulmonary vasculature is normal.  Mediastinum/Nodes: Bulky axillary nodal enlargement with worsening over a series of multiple prior exams most notably these nodes are larger on today's study compared to previous exams.  (Image 33, series 2)  1.6 cm left axillary lymph node previously approximately 1.2 cm. Also lacks fatty hilum on today's exam.  (Image 33, series 2) right axillary lymph node 1.6 cm short  axis previously 1.2 cm.  (Image 60, series 2) left axillary lymph node without fatty hilum previously approximately 6 mm now 1.9 cm. Lymph nodes track into the subpectoral regions bilaterally with a left retropectoral lymph node at 1.4 cm as an example with smaller nodes tracking to the infraclavicular areas bilaterally.  Mediastinal nodal enlargement is not as pronounced with the exception of a right hilar lymph nodes that measures 1.9 cm previously 1.6 cm (image 70, series 2 other lymph nodes in the mediastinum remain mildly enlarged.  Lungs/Pleura: Signs of basilar atelectasis.  Postoperative changes of wedge resection in the left upper lobe. No signs of consolidation or pleural effusion. Airways are patent.  Subtle ground-glass nodule that was present in the right upper lobe on the prior study has an oblong morphology measuring 10 by 6 mm showing increased density as compared to the previous exam size on the prior study is similar, density has increased.  Signs of scarring in the right middle lobe.  Mild thickening along suture lines in the left upper chest, study will serve as a baseline for future follow-up.  Upper Abdomen: Lobulated low-density liver, no focal lesion within imaged portions. The adrenal glands are normal.  Low-density splenic lesion is stable but incompletely imaged. Changes related to nephrectomy are not visualized.  Musculoskeletal: No chest wall mass. No signs of acute bone finding. No signs of destructive bone process.  IMPRESSION: 1. Bulky axillary and subpectoral nodal enlargement with worsening over a series of multiple prior exams. 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 2. Subtle ground-glass  nodule right upper lobe has increased in density since the prior study. Additional site of disease is considered given the increase in density. 3. Hepatic steatosis. Aortic Atherosclerosis (ICD10-I70.0).  Electronically Signed   By: Zetta Bills M.D.   On: 01/30/2020 14:06   I have independently reviewed the above radiology findings and reviewed findings  with the patient.  Recent Labs: Lab Results  Component Value Date   WBC 12.2 (H) 02/09/2020   HGB 12.6 (L) 02/09/2020   HCT 42.4 02/09/2020   PLT 240 02/09/2020   GLUCOSE 129 (H) 01/30/2020   ALT 37 01/30/2020   AST 29 01/30/2020   NA 140 01/30/2020   K 3.4 (L) 01/30/2020   CL 102 01/30/2020   CREATININE 1.61 (H) 01/30/2020   BUN 24 (H) 01/30/2020   CO2 26 01/30/2020   INR 0.9 02/09/2020   PATH: 01/2020 SURGICAL PATHOLOGY  CASE: [id number]  PATIENT: Bradley Irwin  Surgical Pathology Report      Clinical History: History of RCC, now with bilateral axillary  lymphadenopathy worrisome for lymphoma, post US guided bx of left  axillary lymph node. (cm)      FINAL MICROSCOPIC DIAGNOSIS:   A. LYMPH NODE, LEFT AXILLARY, BIOPSY:  -Small lymphocytic lymphoma.  -See comment   COMMENT:   The sections show multiple small needle core biopsy fragments of lymph  nodal tissue displaying effacement of the architecture by a monomorphic  population of primarily small lymphoid cells with round to slightly  irregular nuclei, dense chromatin and small to inconspicuous nucleoli.  The appearance is diffuse with lack of atypical follicles. Flow  cytometric analysis was performed [facility] [id number]) and shows a monoclonal,  kappa restricted B-cell population expressing B-cell antigens including  CD20 associated with CD5 and CD200 expression. No CD10 expression is  identified. Immunohistochemical stains were also performed including  CD20, CD79a, CD3, CD5 and cyclin D1 with appropriate  controls. The  lymphocytes are  predominantly composed of B cells as seen with CD20 and  CD 79a associated with CD5 expression. No cyclin D1 positivity is  identified. There is an admixed minor T-cell population in the  background as primarily seen with CD3. 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 The results were  discussed with Dr. Julien Nordmann on 02/11/2020.   GROSS DESCRIPTION:   The specimen is received in saline and consists of multiple core and  core fragments of tan-white soft tissue, ranging from 0.1cm to 2.2 cm in  length by 0.1 cm in diameter. Representative core is placed in RPMI for  possible flow cytometry, remaining tissue is entirely submitted in 2  cassettes. Craig Staggers 02/11/2020)     Final Diagnosis performed by Susanne Greenhouse, MD.  Electronically signed  02/11/2020   Assessment / Plan:   #1 resection of stage I non-small cell carcinoma left upper lobe #2 recent diagnosis of small lymphocytic lymphoma-currently on observation by Dr. Julien Nordmann  Overall patient is stable, follow-up CT scan is ordered for August we will plan to see the patient back in November 2021  Medication Changes: No orders of the defined types were placed in this encounter.   Grace Isaac 04/29/2020 2:19 PM

## 2020-04-29 ENCOUNTER — Ambulatory Visit: Payer: Medicare PPO | Admitting: Cardiothoracic Surgery

## 2020-04-29 ENCOUNTER — Other Ambulatory Visit: Payer: Self-pay

## 2020-04-29 VITALS — BP 160/79 | HR 77 | Temp 97.9°F | Resp 20 | Ht 66.0 in | Wt 202.0 lb

## 2020-04-29 DIAGNOSIS — R911 Solitary pulmonary nodule: Secondary | ICD-10-CM

## 2020-04-29 DIAGNOSIS — C3492 Malignant neoplasm of unspecified part of left bronchus or lung: Secondary | ICD-10-CM

## 2020-05-25 LAB — CULTURE, FUNGUS WITHOUT SMEAR
CULTURE:: NO GROWTH
MICRO NUMBER:: 10420855
SPECIMEN QUALITY:: ADEQUATE

## 2020-06-16 ENCOUNTER — Telehealth: Payer: Self-pay | Admitting: *Deleted

## 2020-06-16 NOTE — Telephone Encounter (Signed)
   Primary Cardiologist: [name], MD  Chart reviewed as part of pre-operative protocol coverage. Cataract extractions are recognized in guidelines as low risk surgeries that do not typically require specific preoperative testing or holding of blood thinner therapy. Therefore, given past medical history and time since last visit, based on ACC/AHA guidelines, HEZEKIAH VELTRE would be at acceptable risk for the planned procedure without further cardiovascular testing.   I will route this recommendation to the requesting party via Epic fax function and remove from pre-op pool.  Please call with questions.  [place], [place] 06/16/2020, 3:35 PM

## 2020-06-16 NOTE — Telephone Encounter (Signed)
   [facility] Pre-operative Risk Assessment    HEARTCARE STAFF: - Please ensure there is not already an duplicate clearance open for this procedure. - Under Visit Info/Reason for Call, type in Other and utilize the format Clearance MM/DD/YY or Clearance TBD. Do not use dashes or single digits. - If request is for dental extraction, please clarify the # of teeth to be extracted.  Request for surgical clearance:  1. What type of surgery is being performed? CATARACT SURGERY  2. When is this surgery scheduled? 06/30/20   3. What type of clearance is required (medical clearance vs. Pharmacy clearance to hold med vs. Both)? MEDICAL  4. Are there any medications that need to be held prior to surgery and how long? PLAVX and ASA; PER DR. BRADLEY BOWEN PT WILL NEED TO STOP ANY ANTICOAGULANTS FOR 7 DAYS  5. Practice name and name of physician performing surgery? [facility]; DR. Leory Plowman BOWEN   6. What is the office phone number? [phone]   7.   What is the office fax number? [phone]  8.   Anesthesia type (None, local, MAC, general) ? LOCAL WITH MODERATION SEDATION   Julaine Hua 06/16/2020, 3:03 PM  _________________________________________________________________   (provider comments below)

## 2020-06-22 NOTE — Telephone Encounter (Signed)
Call placed to [facility] at Opthalmology office.  Left her a detailed message re: pt holding blood thinners for Cataract sx and asked her to return my call.

## 2020-06-22 NOTE — Telephone Encounter (Signed)
Will route to Dr. Angelena Form for review of holding ASA + Plavix for 7 days as requested by ophthalmology below (we do not typically hold in context of cataract surgery but this is specifically being requested to hold).   Patient has history of CAD, HTN, HLD, renal cell carcinoma s/p nephrectomy, lung CA s/p resection. Cardiac cath 2006 resulted in PCI with DES to MLAD. Has been maintained on DAPT due to first generation stent. Has not required any subsequent PCI. Nuclear stress 03/2019 was without ischemia. Dr. Angelena Form - Please route response to P [facility] PREOP (the pre-op pool). Thank you.

## 2020-06-22 NOTE — Telephone Encounter (Signed)
OK to hold ASA and Plavix for procedure. [name]

## 2020-06-22 NOTE — Telephone Encounter (Signed)
Follow up    Inez called with [facility] is calling to get true documentation that the patient can stop aspirin. They state they need it to be clarified in the preop clearance.

## 2020-06-22 NOTE — Telephone Encounter (Signed)
Heather called back from Dr. Romeo Apple office.  They are requesting pt hold Aspirin and Plavix for 7 days prior to Cataract procedure due to the type of Anesthesia  / Local Block IV sedation.  Please advise!

## 2020-06-22 NOTE — Telephone Encounter (Signed)
   Primary Cardiologist: [name], MD  Chart revisited as part of pre-operative protocol coverage. Patient was contacted 06/22/2020 in reference to pre-operative risk assessment for pending surgery as outlined below.  Bradley Irwin was last seen on 10/20/19 by Dr. Angelena Form and was doing well. I spoke with the patient today who affirms he continues to do well from cardiac standpoint without any new concerning heart symptoms. Therefore, based on ACC/AHA guidelines, the patient would be at acceptable risk for the planned procedure without further cardiovascular testing.   Ophthalmology specifically requesting to hold antiplatelet agents as below. Per Dr. Angelena Form, [place] to hold ASA and Plavix for procedure. We typically advise that blood thinners be resumed when felt safe by performing physician.  I will route this recommendation to the requesting party via Epic fax function and remove from pre-op pool. Please call with questions.  Bradley Pitter, PA-C 06/22/2020, 3:36 PM

## 2020-06-22 NOTE — Telephone Encounter (Signed)
Covering pre-op box today. Please call ophthalmology office. Guidelines typically do not recommend stopping blood thinners for cataract surgery (as outlined in original clearance). Please verify reason for holding - if needing to hold, which medicines and how long? (He is on ASA + Plavix.) Thanks. Kaston Faughn PA-C

## 2020-07-02 ENCOUNTER — Other Ambulatory Visit: Payer: Self-pay | Admitting: Cardiovascular Disease

## 2020-07-30 ENCOUNTER — Inpatient Hospital Stay: Payer: Medicare PPO | Attending: Internal Medicine

## 2020-07-30 ENCOUNTER — Ambulatory Visit
Admission: RE | Admit: 2020-07-30 | Discharge: 2020-07-30 | Disposition: A | Discharge status: Home / Self Care | Payer: Medicare PPO | Source: Ambulatory Visit | Attending: Internal Medicine | Admitting: Internal Medicine

## 2020-07-30 ENCOUNTER — Other Ambulatory Visit: Payer: Self-pay

## 2020-07-30 DIAGNOSIS — Z79899 Other long term (current) drug therapy: Secondary | ICD-10-CM | POA: Insufficient documentation

## 2020-07-30 DIAGNOSIS — Z9049 Acquired absence of other specified parts of digestive tract: Secondary | ICD-10-CM | POA: Diagnosis not present

## 2020-07-30 DIAGNOSIS — Z905 Acquired absence of kidney: Secondary | ICD-10-CM | POA: Diagnosis not present

## 2020-07-30 DIAGNOSIS — R0609 Other forms of dyspnea: Secondary | ICD-10-CM | POA: Insufficient documentation

## 2020-07-30 DIAGNOSIS — C3490 Malignant neoplasm of unspecified part of unspecified bronchus or lung: Secondary | ICD-10-CM

## 2020-07-30 DIAGNOSIS — K760 Fatty (change of) liver, not elsewhere classified: Secondary | ICD-10-CM | POA: Diagnosis not present

## 2020-07-30 DIAGNOSIS — I700 Atherosclerosis of aorta: Secondary | ICD-10-CM | POA: Insufficient documentation

## 2020-07-30 DIAGNOSIS — C3411 Malignant neoplasm of upper lobe, right bronchus or lung: Secondary | ICD-10-CM | POA: Diagnosis not present

## 2020-07-30 DIAGNOSIS — Z885 Allergy status to narcotic agent status: Secondary | ICD-10-CM | POA: Insufficient documentation

## 2020-07-30 DIAGNOSIS — Z882 Allergy status to sulfonamides status: Secondary | ICD-10-CM | POA: Diagnosis not present

## 2020-07-30 DIAGNOSIS — R590 Localized enlarged lymph nodes: Secondary | ICD-10-CM | POA: Insufficient documentation

## 2020-07-30 DIAGNOSIS — Z85528 Personal history of other malignant neoplasm of kidney: Secondary | ICD-10-CM | POA: Insufficient documentation

## 2020-07-30 DIAGNOSIS — I10 Essential (primary) hypertension: Secondary | ICD-10-CM | POA: Insufficient documentation

## 2020-07-30 LAB — CBC WITH DIFFERENTIAL (CANCER CENTER ONLY)
Abs Immature Granulocytes: 0.06 10*3/uL (ref 0.00–0.07)
Basophils Absolute: 0.1 10*3/uL (ref 0.0–0.1)
Basophils Relative: 1 %
Eosinophils Absolute: 0.2 10*3/uL (ref 0.0–0.5)
Eosinophils Relative: 2 %
HCT: 37.3 % — ABNORMAL LOW (ref 39.0–52.0)
Hemoglobin: 11.2 g/dL — ABNORMAL LOW (ref 13.0–17.0)
Immature Granulocytes: 1 %
Lymphocytes Relative: 49 %
Lymphs Abs: 6.1 10*3/uL — ABNORMAL HIGH (ref 0.7–4.0)
MCH: 23 pg — ABNORMAL LOW (ref 26.0–34.0)
MCHC: 30 g/dL (ref 30.0–36.0)
MCV: 76.6 fL — ABNORMAL LOW (ref 80.0–100.0)
Monocytes Absolute: 0.8 10*3/uL (ref 0.1–1.0)
Monocytes Relative: 6 %
Neutro Abs: 4.9 10*3/uL (ref 1.7–7.7)
Neutrophils Relative %: 41 %
Platelet Count: 245 10*3/uL (ref 150–400)
RBC: 4.87 MIL/uL (ref 4.22–5.81)
RDW: 15.9 % — ABNORMAL HIGH (ref 11.5–15.5)
WBC Count: 12 10*3/uL — ABNORMAL HIGH (ref 4.0–10.5)
nRBC: 0 % (ref 0.0–0.2)

## 2020-07-30 LAB — CMP (CANCER CENTER ONLY)
ALT: 40 U/L (ref 0–44)
AST: 40 U/L (ref 15–41)
Albumin: 4.1 g/dL (ref 3.5–5.0)
Alkaline Phosphatase: 128 U/L — ABNORMAL HIGH (ref 38–126)
Anion gap: 13 (ref 5–15)
BUN: 16 mg/dL (ref 8–23)
CO2: 25 mmol/L (ref 22–32)
Calcium: 10 mg/dL (ref 8.9–10.3)
Chloride: 101 mmol/L (ref 98–111)
Creatinine: 1.67 mg/dL — ABNORMAL HIGH (ref 0.61–1.24)
GFR, Est AFR Am: 47 mL/min — ABNORMAL LOW (ref >60)
GFR, Estimated: 40 mL/min — ABNORMAL LOW (ref >60)
Glucose, Bld: 111 mg/dL — ABNORMAL HIGH (ref 70–99)
Potassium: 3.5 mmol/L (ref 3.5–5.1)
Sodium: 139 mmol/L (ref 135–145)
Total Bilirubin: 0.6 mg/dL (ref 0.3–1.2)
Total Protein: 7.7 g/dL (ref 6.5–8.1)

## 2020-07-30 MED ORDER — IOPAMIDOL (ISOVUE-300) INJECTION 61%
50.0000 mL | Freq: Once | INTRAVENOUS | Status: AC | PRN
  Administered 2020-07-30: 50 mL via INTRAVENOUS

## 2020-08-02 ENCOUNTER — Other Ambulatory Visit: Payer: Self-pay

## 2020-08-02 ENCOUNTER — Inpatient Hospital Stay (HOSPITAL_BASED_OUTPATIENT_CLINIC_OR_DEPARTMENT_OTHER): Payer: Medicare PPO | Admitting: Internal Medicine

## 2020-08-02 ENCOUNTER — Encounter: Payer: Self-pay | Admitting: Internal Medicine

## 2020-08-02 VITALS — BP 152/72 | HR 78 | Temp 98.6°F | Resp 16 | Ht 66.0 in | Wt 193.4 lb

## 2020-08-02 DIAGNOSIS — C3412 Malignant neoplasm of upper lobe, left bronchus or lung: Secondary | ICD-10-CM

## 2020-08-02 DIAGNOSIS — C3490 Malignant neoplasm of unspecified part of unspecified bronchus or lung: Secondary | ICD-10-CM | POA: Diagnosis not present

## 2020-08-02 DIAGNOSIS — C8300 Small cell B-cell lymphoma, unspecified site: Secondary | ICD-10-CM | POA: Diagnosis not present

## 2020-08-02 DIAGNOSIS — C9110 Chronic lymphocytic leukemia of B-cell type not having achieved remission: Secondary | ICD-10-CM | POA: Insufficient documentation

## 2020-08-02 DIAGNOSIS — C3411 Malignant neoplasm of upper lobe, right bronchus or lung: Secondary | ICD-10-CM | POA: Diagnosis not present

## 2020-08-02 NOTE — Progress Notes (Signed)
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  PRIOR THERAPY: Status post left upper lobe wedge resection with lymph node sampling.  CURRENT THERAPY: Observation.  INTERVAL HISTORY: Bradley Irwin 74 y.o. male returns to the clinic today for follow-up visit.  His wife Bradley Irwin was available by phone during the visit.  The patient is feeling fine today with no concerning complaints except for shortness of breath with exertion.  He denied having any current chest pain, cough or hemoptysis.  He denied having any fever or chills.  He has no nausea, vomiting, diarrhea or constipation.  He had ultrasound-guided core biopsy of left axillary lymph node on February 09, 2020 and the final pathology was consistent with a small lymphocytic lymphoma.  The patient is here today for evaluation with repeat CT scan of the chest.   MEDICAL HISTORY: Past Medical History:  Diagnosis Date  . Anxiety   . Atypical mole 06/08/2010   Right Abdomen(moderate) (widers[place])  . Atypical mole 04/25/1995   Left Upper ShoulderBlade(slight)  . Atypical mole 12/31/2013   Left Mid Back(Moderate) Mindi Slicker)  . Coronary artery disease   . Eczema   . GERD (gastroesophageal reflux disease)   . Hearing loss   . Hx of cardiovascular stress test    Lexiscan Myoview 5/16:  The study is normal. No ischemia identified. This is a low risk study. Overall left ventricular systolic function was normal. LV cavity size is normal. The left ventricular ejection fraction is normal (55-65%). Sensitivity reduced by bowel loop attenuation artifact.  . Hyperlipidemia   . Hypertension   . Hypothyroidism   . IBS (irritable bowel  syndrome)   . Lung cancer  left upper lobe ([facility]) 06/16/2019  . 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 Psoriasis   . PVD (peripheral vascular disease) ([place])   . Rash   . Renal cell cancer ([place] [facility])   . Wears glasses   . Wears hearing aid    both ears    ALLERGIES:  is allergic to sulfa antibiotics, demerol, and pseudoephedrine.  MEDICATIONS:  Current Outpatient Medications  Medication Sig Dispense Refill  . acetaminophen (TYLENOL) 500 MG tablet Take 500 mg by mouth every 6 (six) hours as needed for mild pain, fever or headache.     . ALPRAZolam (XANAX) 0.5 MG tablet Take 0.5 mg by mouth 2 (two) times daily as needed for anxiety.     Marland Kitchen amLODipine (NORVASC) 10 MG tablet Take 10 mg by mouth daily.     Marland Kitchen aspirin 81 MG tablet Take 81 mg by mouth daily.      Marland Kitchen atenolol (TENORMIN) 100 MG tablet     . atorvastatin (LIPITOR) 10 MG tablet Take 10 mg by mouth daily.      Marland Kitchen augmented betamethasone dipropionate (DIPROLENE-AF) 0.05 % cream Apply topically 2 (two) times daily. 30 g 1  . clidinium-chlordiazePOXIDE (LIBRAX) 5-2.5 MG capsule Take 2 capsules by mouth daily as needed. Takes for  IBS    . clopidogrel (PLAVIX) 75 MG tablet TAKE 1 TABLET BY MOUTH EVERY DAY 90 tablet 3  . dicyclomine (BENTYL) 20 MG tablet     .  diphenhydrAMINE (BENADRYL) 25 MG tablet Take 25 mg by mouth at bedtime.     . hydrochlorothiazide (HYDRODIURIL) 25 MG tablet     . ibuprofen (ADVIL) 200 MG tablet Take 200-400 mg by mouth every 6 (six) hours as needed.    Marland Kitchen levothyroxine (SYNTHROID) 125 MCG tablet Take 125-250 mcg by mouth See admin instructions. Take 125 mcg by mouth Mon-Sat and on Sunday take 250 mcg  3  . omeprazole (PRILOSEC) 20 MG capsule Take 20 mg by mouth 2 (two) times daily.     . simethicone (MYLICON) 622 MG chewable tablet Chew 125 mg by mouth every 6 (six) hours as needed for flatulence.    . valACYclovir (VALTREX) 500 MG tablet Take 500 mg by mouth 2 (two) times daily as needed (fever blisters).    0   No current facility-administered medications for this visit.    SURGICAL HISTORY:  Past Surgical History:  Procedure Laterality Date  . CARDIAC CATHETERIZATION    . CHOLECYSTECTOMY  10/26/11  . COLONOSCOPY    . CORONARY STENT PLACEMENT  2006  . FINGER SURGERY  74 y/o   cut index rt  . INTERCOSTAL NERVE BLOCK  06/16/2019   Procedure: Intercostal Nerve Block;  Surgeon: Grace Isaac, MD;  Location: [facility];  Service: Thoracic;;  . LIPOMA EXCISION Left 11/18/2013   Procedure: EXCISION POSTERIOR NECK LIPOMA ;  Surgeon: Stark Klein, MD;  Location: [facility];  Service: General;  Laterality: Left;  . LIVER BIOPSY  09/18/11  . LIVER LOBECTOMY  10/26/11  . LYMPH NODE DISSECTION  06/16/2019   Procedure: Lymph Node Dissection;  Surgeon: Grace Isaac, MD;  Location: [facility];  Service: Thoracic;;  . NEPHRECTOMY  2008   right  . TONSILLECTOMY    . VIDEO ASSISTED THORACOSCOPY (VATS)/WEDGE RESECTION Left 06/16/2019   Procedure: VIDEO ASSISTED THORACOSCOPY (VATS)/MINI THOROCOTOMY WITH LEFT UPPER LOBE WEDGE RESECTION;  Surgeon: Grace Isaac, MD;  Location: [facility];  Service: Thoracic;  Laterality: Left;  Marland Kitchen VIDEO BRONCHOSCOPY N/A 06/16/2019   Procedure: VIDEO BRONCHOSCOPY;  Surgeon: Grace Isaac, MD;  Location: [facility];  Service: Thoracic;  Laterality: N/A;    REVIEW OF SYSTEMS:  A comprehensive review of systems was negative except for: Respiratory: positive for dyspnea on exertion   PHYSICAL EXAMINATION: General appearance: alert, cooperative and no distress Head: Normocephalic, without obvious abnormality, atraumatic Neck: no adenopathy, no JVD, supple, symmetrical, trachea midline and thyroid not enlarged, symmetric, no tenderness/mass/nodules Lymph nodes: Cervical, supraclavicular, and axillary nodes normal. Resp: clear to auscultation bilaterally Back: symmetric, no curvature. ROM normal. No CVA tenderness. Cardio: regular rate and rhythm, S1, S2 normal, no  murmur, click, rub or gallop GI: soft, non-tender; bowel sounds normal; no masses,  no organomegaly Extremities: extremities normal, atraumatic, no cyanosis or edema  ECOG PERFORMANCE STATUS: 1 - Symptomatic but completely ambulatory  Blood pressure (!) 152/72, pulse 78, temperature 98.6 F (37 C), temperature source Tympanic, resp. rate 16, height '5\' 6"'  (1.676 m), weight 193 lb 6.4 oz (87.7 kg), SpO2 99 %.  LABORATORY DATA: Lab Results  Component Value Date   WBC 12.0 (H) 07/30/2020   HGB 11.2 (L) 07/30/2020   HCT 37.3 (L) 07/30/2020   MCV 76.6 (L) 07/30/2020   PLT 245 07/30/2020      Chemistry      Component Value Date/Time   NA 139 07/30/2020 0958   K 3.5 07/30/2020 0958   CL 101 07/30/2020 0958   CO2 25 07/30/2020  0958   BUN 16 07/30/2020 0958   CREATININE 1.67 (H) 07/30/2020 0958   CREATININE 1.30 (H) 02/06/2016 1714      Component Value Date/Time   CALCIUM 10.0 07/30/2020 0958   ALKPHOS 128 (H) 07/30/2020 0958   AST 40 07/30/2020 0958   ALT 40 07/30/2020 0958   BILITOT 0.6 07/30/2020 0958       RADIOGRAPHIC STUDIES: CT Chest W Contrast  Result Date: 07/30/2020 CLINICAL DATA:  Non-small cell lung cancer staging evaluation EXAM: CT CHEST WITH CONTRAST TECHNIQUE: Multidetector CT imaging of the chest was performed during intravenous contrast administration. CONTRAST:  28m ISOVUE-300 IOPAMIDOL (ISOVUE-300) INJECTION 61% COMPARISON:  01/30/2020 and March 05, 2019 FINDINGS: Cardiovascular: Is calcified atheromatous plaque of the thoracic aorta is similar to the prior exam. No aneurysmal dilation. Atheromatous changes are mild. No pericardial effusion. Coronary artery disease as before. Central pulmonary vasculature is unremarkable on venous phase assessment. Mediastinum/Nodes: Bulky bilateral axillary lymphadenopathy. (Image 52, series 2) 1.8 cm LEFT axillary lymph node previously 1.9 cm. (Image 36, series 2) 1.6 cm LEFT axillary lymph node previously 1.6 cm. LEFT  subpectoral lymph node (image 42, series 2) 1.4 cm, previously 1.4 cm. (Image 32, series 2) 1.5 cm RIGHT axillary lymph nodes similar to the prior study. Scattered small lymph nodes at the thoracic inlet are unchanged. Index RIGHT hilar node (image 73, series 2) 1.9 cm, stable compared to the prior study. Small subcarinal nodes and scattered nodes elsewhere in the mediastinum are similarly unchanged. The esophagus is grossly normal. Lungs/Pleura: Basilar atelectasis. Airways are patent. Thickening along the suture line in the LEFT chest from wedge resection is unchanged. Small nodule in the RIGHT upper lobe (image 36, series 8) 10 mm, increased density of previous ground-glass focus in the RIGHT upper lobe since the study of March of 2020 but stable appearance compared to the most recent study of February of 2021. No consolidation. No pleural effusion. Airways are patent. Upper Abdomen: Signs of hepatic steatosis as before. No focal, suspicious lesion within the visualized liver areas of variable density seen along the margin of the RIGHT hemi liver and along the inter lobar fissure are unchanged. The adrenal glands are normal. 9 mm lesion with low-density in the anterior spleen not seen on the previous exam (image 135, series 2) tiny 3 mm area of low attenuation with seen in this location on the prior study. 3 cm lesion in the posterior spleen is unchanged since 2017. No acute upper abdominal process. Musculoskeletal: No acute musculoskeletal process. No destructive bone finding. IMPRESSION: 1. Adenopathy in the chest unchanged from the prior exam. 2. 10 mm nodule in the RIGHT upper lobe, increased density of previous ground-glass focus in the RIGHT upper lobe since the study of March of 2020 but stable appearance compared to the most recent study of February of 2021. Attention on follow-up. 3. 9 mm lesion with low-density in the anterior spleen not seen on the previous exam this new area shows intermediate  density, approximately 45 Hounsfield units and is nonspecific but new and or enlarged since the previous exam. Metastatic disease is considered though findings remain nonspecific. PET scan or MRI may be helpful for further evaluation. Alternatively short interval follow-up could be performed. 4. Hepatic steatosis. 5. Aortic atherosclerosis. Aortic Atherosclerosis (ICD10-I70.0). Electronically Signed   By: GZetta BillsM.D.   On: 07/30/2020 17:33    ASSESSMENT AND PLAN: This is a very pleasant 74years old white male with stage Ia non-small cell lung cancer,  adenocarcinoma with positive MET 14 splice mutation status post wedge resection of the left upper lobe with lymph node sampling in June 2020.  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 He is currently on observation and feeling fine with no concerning complaints. The patient had repeat CT scan of the chest performed recently.  I personally and independently reviewed the scan and discussed the results with the patient and his wife. His scan showed the adenopathy in the chest as well as the axilla in addition to low-density lesion in the anterior spleen likely related to his CLL/small lymphocytic lymphoma. I recommended for the patient to continue on observation with repeat CT scan of the chest in 6 months. He was advised to call immediately if he has any concerning symptoms in the interval. The patient voices understanding of current disease status and treatment options and is in agreement with the current care plan.  All questions were answered. The patient knows to call the clinic with any problems, questions or concerns. We can certainly see the patient much sooner if necessary.  Disclaimer: This note was dictated with voice recognition software. Similar sounding words can inadvertently be transcribed and may not be corrected upon review.

## 2020-08-09 ENCOUNTER — Telehealth: Payer: Self-pay | Admitting: Internal Medicine

## 2020-08-09 NOTE — Telephone Encounter (Signed)
Scheduled per 8/9 los. Called and left msg. Mailed printout

## 2020-08-13 ENCOUNTER — Encounter: Payer: Self-pay | Admitting: Internal Medicine

## 2020-09-20 ENCOUNTER — Other Ambulatory Visit: Payer: Self-pay | Admitting: Cardiovascular Disease

## 2020-10-28 ENCOUNTER — Ambulatory Visit: Payer: Medicare PPO | Admitting: Cardiothoracic Surgery

## 2020-10-28 ENCOUNTER — Other Ambulatory Visit: Payer: Self-pay

## 2020-10-28 VITALS — BP 160/80 | HR 77 | Resp 20 | Ht 66.0 in | Wt 193.0 lb

## 2020-10-28 DIAGNOSIS — C3492 Malignant neoplasm of unspecified part of left bronchus or lung: Secondary | ICD-10-CM | POA: Diagnosis not present

## 2020-10-28 NOTE — Progress Notes (Signed)
[address]       [place] [address]             [phone]                  Bradley Irwin [facility] Medical Record #[id number] Date of Birth: [birth year]  Referring HQ:IONGE, Christean Grief, MD Primary Cardiology: Primary Care:[name], MD  Chief Complaint:  Follow Up Visit OPERATIVE REPORT DATE OF PROCEDURE:  06/16/2019 PREOPERATIVE DIAGNOSIS:  Left upper lobe lung nodule. POSTOPERATIVE DIAGNOSIS:  Adenocarcinoma of the lung. PROCEDURES PERFORMED:  Bronchoscopy, left video-assisted thoracoscopy, mini thoracotomy, wedge resection, left upper lobe with lymph node dissection and intercostal nerve block.  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  History of Present Illness:     Patient notes that he feels well.  He denies shortness of breath.  In February of 2021  he had a needle biopsy of left axillary lymph nodes that were slowly enlarging-now followed in medical oncology for lymphocytic lymphoma and previous history of stage Ia carcinoma of the left upper lobe  Zubrod Score: At the time of surgery this patient's most appropriate activity status/level should be described as: _0     0    Normal activity, no symptoms _1     1    Restricted in physical strenuous activity but ambulatory, able to do out light work _2     2    Ambulatory and capable of self care, unable to do work activities, up and about                 >50 % of waking hours                                                                                    _3     3    Only limited self care, in bed greater than 50% of waking hours _4     4    Completely disabled, no self care, confined to bed or chair _5     5    Moribund  Social History   Tobacco Use  Smoking Status Former Smoker  . Packs/day: 1.00  . Years: 15.00  . Pack years: 15.00  . Types: Cigarettes  . Quit date: 10/26/1983  . Years since quitting: 37.0  Smokeless Tobacco Never Used       Allergies  Allergen Reactions  . Sulfa Antibiotics Other (See Comments)    Happened when he was child. Does not remember reaction.  . Demerol Other (See Comments)    Severe headache.  . Pseudoephedrine Other (See Comments)    Hypertension     Current Outpatient Medications  Medication Sig Dispense Refill  .  acetaminophen (TYLENOL) 500 MG tablet Take 500 mg by mouth every 6 (six) hours as needed for mild pain, fever or headache.     . ALPRAZolam (XANAX) 0.5 MG tablet Take 0.5 mg by mouth 2 (two) times daily as needed for anxiety.     Marland Kitchen amLODipine (NORVASC) 10 MG tablet Take 10 mg by mouth daily.     Marland Kitchen aspirin 81 MG tablet Take 81 mg by mouth daily.      Marland Kitchen atenolol (TENORMIN) 100 MG tablet     . augmented betamethasone dipropionate (DIPROLENE-AF) 0.05 % cream Apply topically 2 (two) times daily. 30 g 1  . clidinium-chlordiazePOXIDE (LIBRAX) 5-2.5 MG capsule Take 2 capsules by mouth daily as needed. Takes for  IBS    . clopidogrel (PLAVIX) 75 MG tablet Take 1 tablet (75 mg total) by mouth daily. Please keep upcoming appt in January with Dr. Angelena Form for future refills. Thank you 90 tablet 0  . dicyclomine (BENTYL) 20 MG tablet     . diphenhydrAMINE (BENADRYL) 25 MG tablet Take 25 mg by mouth at bedtime.     . hydrochlorothiazide (HYDRODIURIL) 25 MG tablet     . ibuprofen (ADVIL) 200 MG tablet Take 200-400 mg by mouth every 6 (six) hours as needed.    Marland Kitchen levothyroxine (SYNTHROID) 137 MCG tablet     . omeprazole (PRILOSEC) 20 MG capsule Take 20 mg by mouth 2 (two) times daily.      . rosuvastatin (CRESTOR) 10 MG tablet Take 10 mg by mouth daily.    . simethicone (MYLICON) 373 MG chewable tablet Chew 125 mg by mouth every 6 (six) hours as needed for flatulence.    . valACYclovir (VALTREX) 500 MG tablet Take 500 mg by mouth 2 (two) times daily as needed (fever blisters).   0   No current facility-administered medications for this visit.       Physical Exam: BP (!) 160/80   Pulse 77   Resp 20   Ht _0  (1.676 m)   Wt 193 lb (87.5 kg)   SpO2 98% Comment: RA  BMI 31.15 kg/m   General appearance: alert, cooperative and no distress General appearance: alert, cooperative and no distress Head: Normocephalic, without obvious abnormality, atraumatic Neck: no adenopathy, no carotid bruit, no JVD, supple, symmetrical, trachea midline and thyroid not enlarged, symmetric, no tenderness/mass/nodules Lymph nodes: Cervical, supraclavicular, and axillary nodes normal. Resp: clear to auscultation bilaterally Cardio: regular rate and rhythm, S1, S2 normal, no murmur, click, rub or gallop GI: soft, non-tender; bowel sounds normal; no masses,  no organomegaly Extremities: extremities normal, atraumatic, no cyanosis or edema and Homans sign is negative, no sign of DVT Neurologic: Grossly normal  Diagnostic Studies & Laboratory data:         Recent Radiology Findings Follow Scan by Medical Oncology 07/2020  CLINICAL DATA:  Non-small cell lung cancer staging evaluation  EXAM: CT CHEST WITH CONTRAST  TECHNIQUE: Multidetector CT imaging of the chest was performed during intravenous contrast administration.  CONTRAST:  45m ISOVUE-300 IOPAMIDOL (ISOVUE-300) INJECTION 61%  COMPARISON:  01/30/2020 and March 05, 2019  FINDINGS: Cardiovascular: Is calcified atheromatous plaque of the thoracic aorta is similar to the prior exam. No aneurysmal dilation. Atheromatous changes are mild. No pericardial effusion. Coronary artery disease as before. Central pulmonary  vasculature is unremarkable on venous phase assessment.  Mediastinum/Nodes: Bulky bilateral axillary lymphadenopathy.  (Image 52, series 2) 1.8 cm LEFT axillary lymph node previously 1.9 cm.  (Image 36, series 2) 1.6 cm  LEFT axillary lymph node previously 1.6 cm.  LEFT subpectoral lymph node (image 42, series 2) 1.4 cm, previously 1.4 cm.  (Image 32, series 2) 1.5 cm RIGHT axillary lymph nodes similar to the prior study. Scattered small lymph nodes at the thoracic inlet are unchanged.  Index RIGHT hilar node (image 73, series 2) 1.9 cm, stable compared to the prior study. Small subcarinal nodes and scattered nodes elsewhere in the mediastinum are similarly unchanged. The esophagus is grossly normal.  Lungs/Pleura: Basilar atelectasis. Airways are patent. Thickening along the suture line in the LEFT chest from wedge resection is unchanged. Small nodule in the RIGHT upper lobe (image 36, series 8) 10 mm, increased density of previous ground-glass focus in the RIGHT upper lobe since the study of March of 2020 but stable appearance compared to the most recent study of February of 2021. No consolidation. No pleural effusion. Airways are patent.  Upper Abdomen: Signs of hepatic steatosis as before. No focal, suspicious lesion within the visualized liver areas of variable density seen along the margin of the RIGHT hemi liver and along the inter lobar fissure are unchanged. The adrenal glands are normal. 9 mm lesion with low-density in the anterior spleen not seen on the previous exam (image 135, series 2) tiny 3 mm area of low attenuation with seen in this location on the prior study.  3 cm lesion in the posterior spleen is unchanged since 2017. No acute upper abdominal process.  Musculoskeletal: No acute musculoskeletal process. No destructive bone finding.  IMPRESSION: 1. Adenopathy in the chest unchanged from the prior exam. 2. 10 mm nodule in the RIGHT upper  lobe, increased density of previous ground-glass focus in the RIGHT upper lobe since the study of March of 2020 but stable appearance compared to the most recent study of February of 2021. Attention on follow-up. 3. 9 mm lesion with low-density in the anterior spleen not seen on the previous exam this new area shows intermediate density, approximately 45 Hounsfield units and is nonspecific but new and or enlarged since the previous exam. Metastatic disease is considered though findings remain nonspecific. PET scan or MRI may be helpful for further evaluation. Alternatively short interval follow-up could be performed. 4. Hepatic steatosis. 5. Aortic atherosclerosis.  Aortic Atherosclerosis (ICD10-I70.0).   Electronically Signed   By: Zetta Bills M.D.   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  EXAM: CT CHEST WITH CONTRAST  TECHNIQUE: Multidetector CT imaging of the chest was performed during intravenous contrast administration.  CONTRAST:  28m ISOVUE-300 IOPAMIDOL (ISOVUE-300) INJECTION 61%  COMPARISON:  03/05/2019  FINDINGS: Cardiovascular: Aortic caliber is normal signs of noncalcified atherosclerotic plaque. Signs of coronary artery disease. Heart size mildly enlarged. No pericardial effusion. Central pulmonary vasculature is normal.  Mediastinum/Nodes: Bulky axillary nodal enlargement with worsening over a series of multiple prior exams most notably these nodes are larger on today's study compared to previous exams.  (Image 33, series 2) 1.6 cm left axillary lymph node previously approximately 1.2 cm. Also lacks fatty hilum on today's exam.  (Image 33, series 2) right axillary lymph node 1.6 cm short axis previously 1.2 cm.  (Image 60, series 2) left axillary lymph node without fatty  hilum previously approximately 6 mm now 1.9 cm. Lymph nodes track into the subpectoral regions bilaterally with a left retropectoral lymph node at 1.4 cm as  an example with smaller nodes tracking to the infraclavicular areas bilaterally.  Mediastinal nodal enlargement is not as pronounced with the exception of a right hilar lymph nodes that measures 1.9 cm previously 1.6 cm (image 70, series 2 other lymph nodes in the mediastinum remain mildly enlarged.  Lungs/Pleura: Signs of basilar atelectasis.  Postoperative changes of wedge resection in the left upper lobe. No signs of consolidation or pleural effusion. Airways are patent.  Subtle ground-glass nodule that was present in the right upper lobe on the prior study has an oblong morphology measuring 10 by 6 mm showing increased density as compared to the previous exam size on the prior study is similar, density has increased.  Signs of scarring in the right middle lobe.  Mild thickening along suture lines in the left upper chest, study will serve as a baseline for future follow-up.  Upper Abdomen: Lobulated low-density liver, no focal lesion within imaged portions. The adrenal glands are normal.  Low-density splenic lesion is stable but incompletely imaged. Changes related to nephrectomy are not visualized.  Musculoskeletal: No chest wall mass. No signs of acute bone finding. No signs of destructive bone process.  IMPRESSION: 1. Bulky axillary and subpectoral nodal enlargement with worsening over a series of multiple prior exams. 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 2. Subtle ground-glass nodule right upper lobe has increased in density since the prior study. Additional site of disease is considered given the increase in density. 3. Hepatic steatosis. Aortic Atherosclerosis (ICD10-I70.0).  Electronically Signed   By: Zetta Bills M.D.    On: 01/30/2020 14:06   I have independently reviewed the above radiology findings and reviewed findings  with the patient.  Recent Labs: Lab Results  Component Value Date   WBC 12.0 (H) 07/30/2020   HGB 11.2 (L) 07/30/2020   HCT 37.3 (L) 07/30/2020   PLT 245 07/30/2020   GLUCOSE 111 (H) 07/30/2020   ALT 40 07/30/2020   AST 40 07/30/2020   NA 139 07/30/2020   K 3.5 07/30/2020   CL 101 07/30/2020   CREATININE 1.67 (H) 07/30/2020   BUN 16 07/30/2020   CO2 25 07/30/2020   INR 0.9 02/09/2020   PATH: 01/2020 SURGICAL PATHOLOGY  CASE: [id number]  PATIENT: Bradley Irwin  Surgical Pathology Report      Clinical History: History of RCC, now with bilateral axillary  lymphadenopathy worrisome for lymphoma, post US guided bx of left  axillary lymph node. (cm)      FINAL MICROSCOPIC DIAGNOSIS:   A. LYMPH NODE, LEFT AXILLARY, BIOPSY:  -Small lymphocytic lymphoma.  -See comment   COMMENT:   The sections show multiple small needle core biopsy fragments of lymph  nodal tissue displaying effacement of the architecture by a monomorphic  population of primarily small lymphoid cells with round to slightly  irregular nuclei, dense chromatin and small to inconspicuous nucleoli.  The appearance is diffuse with lack of atypical follicles. Flow  cytometric analysis was performed [facility] [phone]) and shows a monoclonal,  kappa restricted B-cell population expressing B-cell antigens including  CD20 associated with CD5 and CD200 expression. No CD10 expression is  identified. Immunohistochemical stains were also performed including  CD20, CD79a, CD3, CD5 and cyclin D1 with appropriate controls. The  lymphocytes are predominantly composed of B cells as seen with CD20 and  CD 79a associated with CD5 expression. No cyclin D1 positivity is  identified. There is an admixed minor T-cell population in the  background as primarily  seen with CD3. 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 The results were  discussed with Dr. Julien Nordmann on 02/11/2020.   GROSS DESCRIPTION:   The specimen is received in saline and consists of multiple core and  core fragments of tan-white soft tissue, ranging from 0.1cm to 2.2 cm in  length by 0.1 cm in diameter. Representative core is placed in RPMI for  possible flow cytometry, remaining tissue is entirely submitted in 2  cassettes. Craig Staggers 02/11/2020)     Final Diagnosis performed by Susanne Greenhouse, MD.  Electronically signed  02/11/2020   Assessment / Plan:   #1 resection of stage I non-small cell carcinoma left upper lobe #2 recent diagnosis of small lymphocytic lymphoma-currently on observation by Dr. Julien Nordmann Patient has been followed carefully by Dr. Earlie Server with every 6 month CT scans of the chest both for his lymphoma and follow-up on his lung resection.  I have not made him a return appointment to come to the surgical office as he is being closely followed in medical oncology with routine CT scans.    Medication Changes: No orders of the defined types were placed in this encounter.   Grace Isaac 10/28/2020 1:50 PM

## 2021-01-06 ENCOUNTER — Ambulatory Visit: Payer: Medicare PPO | Admitting: Cardiovascular Disease

## 2021-01-06 ENCOUNTER — Encounter: Payer: Self-pay | Admitting: Cardiovascular Disease

## 2021-01-06 ENCOUNTER — Other Ambulatory Visit: Payer: Self-pay

## 2021-01-06 VITALS — BP 144/84 | HR 58 | Ht 66.0 in | Wt 195.6 lb

## 2021-01-06 DIAGNOSIS — E7800 Pure hypercholesterolemia, unspecified: Secondary | ICD-10-CM

## 2021-01-06 DIAGNOSIS — I2510 Atherosclerotic heart disease of native coronary artery without angina pectoris: Secondary | ICD-10-CM | POA: Diagnosis not present

## 2021-01-06 DIAGNOSIS — I10 Essential (primary) hypertension: Secondary | ICD-10-CM | POA: Diagnosis not present

## 2021-01-06 NOTE — Progress Notes (Signed)
Chief Complaint  Patient presents with  . Follow-up    CAD   History of Present Illness: 75 yo male with history of CAD, HTN, HLD, renal cell carcinoma s/p right nephrectomy, lung cancer s/p resection, lymphocytic lymphoma/CLL here today for cardiac follow up. Cardiac cath February 2006 with a 2.75 x 28 mm Costar study stent (DES) placed in the mid LAD. Last cath November 2006 with patent mid LAD stent, 40% proximal LAD stenosis, 80% small diagonal branch stenosis, minimal disease in the Circumflex and RCA. Echo 03/18/13 with normal LV function, mild LVH. Nuclear stress test May 2016 with no evidence of ischemia. Lung mass found in early 2020 and biopsy was c/w adenocarcinoma. He is now s/p left upper lobe lung resection. Nuclear stress test April 2020 without ischemia. He has been diagnosed with lymphocytic lymphoma/CLL and is being followed by Dr. Julien Nordmann.   He is here today for follow up. The patient denies any chest pain, dyspnea, palpitations, lower extremity edema, orthopnea, PND, dizziness, near syncope or syncope. He has pain in his right wrist for 3-4 days. No swelling. He has good strength in his right hand.   Primary Care Physician: [name], MD  Past Medical History:  Diagnosis Date  . Anxiety   . Atypical mole 06/08/2010   Right Abdomen(moderate) (widershave)  . Atypical mole 04/25/1995   Left Upper ShoulderBlade(slight)  . Atypical mole 12/31/2013   Left Mid Back(Moderate) Mindi Slicker)  . Coronary artery disease   . Eczema   . GERD (gastroesophageal reflux disease)   . Hearing loss   . Hx of cardiovascular stress test    Lexiscan Myoview 5/16:  The study is normal. No ischemia identified. This is a low risk study. Overall left ventricular systolic function was normal. LV cavity size is normal. The left ventricular ejection fraction is normal (55-65%). Sensitivity reduced by bowel loop attenuation artifact.  . Hyperlipidemia   . Hypertension   . Hypothyroidism   .  IBS (irritable bowel syndrome)   . Lung cancer  left upper lobe ([place]) 06/16/2019  . 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 Psoriasis   . PVD (peripheral vascular disease) ([place])   . Rash   . Renal cell cancer ([place])   . Wears glasses   . Wears hearing aid    both ears    Past Surgical History:  Procedure Laterality Date  . CARDIAC CATHETERIZATION    . CHOLECYSTECTOMY  10/26/11  . COLONOSCOPY    . CORONARY STENT PLACEMENT  2006  . FINGER SURGERY  75 y/o   cut index rt  . INTERCOSTAL NERVE BLOCK  06/16/2019   Procedure: Intercostal Nerve Block;  Surgeon: Grace Isaac, MD;  Location: [facility];  Service: Thoracic;;  . LIPOMA EXCISION Left 11/18/2013   Procedure: EXCISION POSTERIOR NECK LIPOMA ;  Surgeon: Stark Klein, MD;  Location: [place];  Service: General;  Laterality: Left;  . LIVER BIOPSY  09/18/11  . LIVER LOBECTOMY  10/26/11  . LYMPH NODE DISSECTION  06/16/2019   Procedure: Lymph Node Dissection;  Surgeon: Grace Isaac, MD;  Location: [facility];  Service: Thoracic;;  . NEPHRECTOMY  2008   right  . TONSILLECTOMY    . VIDEO ASSISTED THORACOSCOPY (VATS)/WEDGE RESECTION Left 06/16/2019   Procedure: VIDEO ASSISTED THORACOSCOPY (VATS)/MINI THOROCOTOMY WITH LEFT UPPER LOBE WEDGE RESECTION;  Surgeon: Grace Isaac, MD;  Location: [place];  Service: Thoracic;  Laterality: Left;  Marland Kitchen VIDEO BRONCHOSCOPY  N/A 06/16/2019   Procedure: VIDEO BRONCHOSCOPY;  Surgeon: Grace Isaac, MD;  Location: [facility] OR;  Service: Thoracic;  Laterality: N/A;    Current Outpatient Medications  Medication Sig Dispense Refill  . acetaminophen (TYLENOL) 500 MG tablet Take 500 mg by mouth every 6 (six) hours as needed for mild pain, fever or headache.     . ALPRAZolam (XANAX) 0.5 MG tablet Take 0.5 mg by mouth 2 (two) times daily as needed for anxiety.     Marland Kitchen amLODipine (NORVASC) 10 MG tablet Take 10 mg by mouth daily.     Marland Kitchen aspirin 81 MG tablet Take 81 mg by mouth daily.      Marland Kitchen  atenolol (TENORMIN) 100 MG tablet Take 100 mg by mouth daily.    Marland Kitchen augmented betamethasone dipropionate (DIPROLENE-AF) 0.05 % cream Apply topically 2 (two) times daily. 30 g 1  . clidinium-chlordiazePOXIDE (LIBRAX) 5-2.5 MG capsule Take 2 capsules by mouth daily as needed. Takes for  IBS    . clopidogrel (PLAVIX) 75 MG tablet Take 1 tablet (75 mg total) by mouth daily. Please keep upcoming appt in January with Dr. Angelena Form for future refills. Thank you 90 tablet 0  . dicyclomine (BENTYL) 20 MG tablet     . diphenhydrAMINE (BENADRYL) 25 MG tablet Take 25 mg by mouth at bedtime.    . hydrochlorothiazide (HYDRODIURIL) 25 MG tablet Take 25 mg by mouth daily.    Marland Kitchen ibuprofen (ADVIL) 200 MG tablet Take 200-400 mg by mouth every 6 (six) hours as needed.    Marland Kitchen levothyroxine (SYNTHROID) 137 MCG tablet Take 137 mcg by mouth daily before breakfast.    . omeprazole (PRILOSEC) 20 MG capsule Take 20 mg by mouth 2 (two) times daily.     . rosuvastatin (CRESTOR) 10 MG tablet Take 10 mg by mouth daily.    . simethicone (MYLICON) 962 MG chewable tablet Chew 125 mg by mouth every 6 (six) hours as needed for flatulence.    . valACYclovir (VALTREX) 500 MG tablet Take 500 mg by mouth 2 (two) times daily as needed (fever blisters).   0   No current facility-administered medications for this visit.    Allergies  Allergen Reactions  . Sulfa Antibiotics Other (See Comments)    Happened when he was child. Does not remember reaction.  . Demerol Other (See Comments)    Severe headache.  . Pseudoephedrine Other (See Comments)    Hypertension     Social History   Socioeconomic History  . Marital status: Married    Spouse name: Not on file  . Number of children: 0  . Years of education: Not on file  . Highest education level: Not on file  Occupational History  . Occupation: Retired from state of [employer]  . Smoking status: Former Smoker    Packs/day: 1.00    Years: 15.00    Pack years: 15.00     Types: Cigarettes    Quit date: 10/26/1983    Years since quitting: 37.2  . Smokeless tobacco: Never Used  Substance and Sexual Activity  . Alcohol use: Yes    Alcohol/week: 14.0 - 20.0 standard drinks    Types: 14 - 20 Glasses of wine per week    Comment: 2-3 per day - wine, beer, liquor  . Drug use: No  . Sexual activity: Not on file  Other Topics Concern  . Not on file  Social History Narrative  . Not on file   Social Determinants  of Health   Financial Resource Strain: Not on file  Food Insecurity: Not on file  Transportation Needs: Not on file  Physical Activity: Not on file  Stress: Not on file  Social Connections: Not on file  Intimate Partner Violence: Not on file    Family History  Problem Relation Age of Onset  . Hypertension Mother   . Heart failure Mother   . Heart disease Mother   . Peripheral vascular disease Father   . Lung cancer Father   . Cancer Father        lung  . Hypertension Sister   . Heart attack Maternal Uncle   . Colon cancer Neg Hx     Review of Systems:  As stated in the HPI and otherwise negative.   BP (!) 144/84   Pulse (!) 58   Ht 5\' 6"  (1.676 m)   Wt 195 lb 9.6 oz (88.7 kg)   SpO2 97%   BMI 31.57 kg/m   Physical Examination:  General: Well developed, well nourished, NAD  HEENT: OP clear, mucus membranes moist  SKIN: warm, dry. No rashes. Neuro: No focal deficits  Musculoskeletal: Muscle strength 5/5 all ext  Psychiatric: Mood and affect normal  Neck: No JVD, no carotid bruits, no thyromegaly, no lymphadenopathy.  Lungs:Clear bilaterally, no wheezes, rhonci, crackles Cardiovascular: Regular rate and rhythm. No murmurs, gallops or rubs. Abdomen:Soft. Bowel sounds present. Non-tender.  Extremities: No lower extremity edema. Pulses are 2 + in the bilateral DP/PT.  Cardiac cath 11/02/05: 1. The left main coronary is free of critical disease.  2. The left anterior descending artery demonstrates about an area of about  30-40%  narrowing segmentally in the proximal LAD overlapping the origin  of the diagonal. The diagonal is moderate in distribution, but is fairly  small in caliber. It has an 80% ostial stenosis. Compared to the  previous study there is perhaps very mild progression of disease in the  proximal LAD. The mid vessel, where the stent was previously placed  remains widely patent with excellent runoff into the distal vessel. The  distal LAD is free of critical disease.  3. The circumflex is large-caliber vessel with mild proximal irregularity  overlapping the origin of the first marginal branch. Importantly, after  the administration of intracoronary nitroglycerin this area looked more  like 30-40% due to vasodilatation of the distal vessel. However, none  of this appeared to be flow-limiting and was fairly large in caliber.  4. The right coronary artery is a large-caliber vessel. There is perhaps  mild irregularity in the mid vessel but no areas of high-grade disease.  5. Ventriculography in the RAO projection reveals vigorous global systolic  function without segmental wall motion abnormality.  Echo 03/18/13: Left ventricle: The cavity size was normal. Wall thickness was increased in a pattern of mild LVH. There was mild concentric hypertrophy. Systolic function was normal. The estimated ejection fraction was in the range of 60% to 65%. Wall motion was normal; there were no regional wall motion abnormalities. Left ventricular diastolic function parameters were normal. - Aortic valve: Trivial regurgitation.  EKG:  EKG is ordered today.  The EKG is reviewed by me and shows sinus  Recent Labs: 07/30/2020: ALT 40; BUN 16; Creatinine 1.67; Hemoglobin 11.2; Platelet Count 245; Potassium 3.5; Sodium 139   Lipid Panel Followed in primary care   Wt Readings from Last 3 Encounters:  01/06/21 195 lb 9.6 oz (88.7 kg)  10/28/20 193 lb (87.5 kg)  08/02/20 193  lb 6.4 oz (87.7 kg)     Other studies  Reviewed: Additional studies/ records that were reviewed today include. Review of the above records demonstrates:  Assessment and Plan:   1. CAD without angina: No chest pain. Continue ASA and Plavix (given first generation drug eluting stent). Continue statin and beta blocker. Will arrange an echo now to assess LV systolic function.   2. HTN: BP is well controlled. Continue current therapy  3. HLD: Lipids followed in primary care. Continue statin  Current medicines are reviewed at length with the patient today.  The patient does not have concerns regarding medicines.  The following changes have been made:  no change  Labs/ tests ordered today include:  Orders Placed This Encounter  Procedures  . EKG 12-Lead  . ECHOCARDIOGRAM COMPLETE    Disposition:   FU with me in 12  months  Signed, [name], MD 01/06/2021 3:39 PM    [facility] [address], [place], [place]  [address] Phone: [phone]; Fax: [phone]

## 2021-01-06 NOTE — Patient Instructions (Addendum)
Medication Instructions:  Your physician recommends that you continue on your current medications as directed. Please refer to the Current Medication list given to you today.   *If you need a refill on your cardiac medications before your next appointment, please call your pharmacy*   Lab Work: none If you have labs (blood work) drawn today and your tests are completely normal, you will receive your results only by: Marland Kitchen MyChart Message (if you have MyChart) OR . A paper copy in the mail If you have any lab test that is abnormal or we need to change your treatment, we will call you to review the results.   Testing/Procedures: Your physician has requested that you have an echocardiogram. Echocardiography is a painless test that uses sound waves to create images of your heart. It provides your doctor with information about the size and shape of your heart and how well your heart's chambers and valves are working. This procedure takes approximately one hour. There are no restrictions for this procedure.    Follow-Up: At [facility], you and your health needs are our priority.  As part of our continuing mission to provide you with exceptional heart care, we have created designated Provider Care Teams.  These Care Teams include your primary Cardiologist (physician) and Advanced Practice Providers (APPs -  Physician Assistants and Nurse Practitioners) who all work together to provide you with the care you need, when you need it.  We recommend signing up for the patient portal called "MyChart".  Sign up information is provided on this After Visit Summary.  MyChart is used to connect with patients for Virtual Visits (Telemedicine).  Patients are able to view lab/test results, encounter notes, upcoming appointments, etc.  Non-urgent messages can be sent to your provider as well.   To learn more about what you can do with MyChart, go to [email or web address].    Your next appointment:   12  month(s)  The format for your next appointment:   In Person  Provider:   You may see [name], MD or one of the following Advanced Practice Providers on your designated Care Team:    Melina Copa, PA-C  Ermalinda Barrios, PA-C    Other Instructions

## 2021-01-25 ENCOUNTER — Other Ambulatory Visit: Payer: Self-pay

## 2021-01-25 ENCOUNTER — Ambulatory Visit (HOSPITAL_COMMUNITY): Payer: Medicare PPO | Attending: Internal Medicine

## 2021-01-25 DIAGNOSIS — I10 Essential (primary) hypertension: Secondary | ICD-10-CM | POA: Diagnosis not present

## 2021-01-25 DIAGNOSIS — I2510 Atherosclerotic heart disease of native coronary artery without angina pectoris: Secondary | ICD-10-CM | POA: Diagnosis not present

## 2021-01-25 LAB — ECHOCARDIOGRAM COMPLETE
Area-P 1/2: 3.27 cm2
S' Lateral: 2.5 cm

## 2021-01-25 MED ORDER — PERFLUTREN LIPID MICROSPHERE
1.0000 mL | INTRAVENOUS | Status: AC | PRN
  Administered 2021-01-25: 2 mL via INTRAVENOUS

## 2021-01-31 ENCOUNTER — Ambulatory Visit (HOSPITAL_COMMUNITY)
Admission: RE | Admit: 2021-01-31 | Discharge: 2021-01-31 | Disposition: A | Discharge status: Home / Self Care | Payer: Medicare PPO | Source: Ambulatory Visit | Attending: Internal Medicine | Admitting: Internal Medicine

## 2021-01-31 ENCOUNTER — Inpatient Hospital Stay: Payer: Medicare PPO | Attending: Internal Medicine

## 2021-01-31 ENCOUNTER — Other Ambulatory Visit: Payer: Self-pay

## 2021-01-31 ENCOUNTER — Encounter (HOSPITAL_COMMUNITY): Payer: Self-pay

## 2021-01-31 DIAGNOSIS — C3412 Malignant neoplasm of upper lobe, left bronchus or lung: Secondary | ICD-10-CM | POA: Diagnosis present

## 2021-01-31 DIAGNOSIS — J984 Other disorders of lung: Secondary | ICD-10-CM | POA: Insufficient documentation

## 2021-01-31 DIAGNOSIS — Z882 Allergy status to sulfonamides status: Secondary | ICD-10-CM | POA: Insufficient documentation

## 2021-01-31 DIAGNOSIS — Z85528 Personal history of other malignant neoplasm of kidney: Secondary | ICD-10-CM | POA: Insufficient documentation

## 2021-01-31 DIAGNOSIS — C3490 Malignant neoplasm of unspecified part of unspecified bronchus or lung: Secondary | ICD-10-CM

## 2021-01-31 DIAGNOSIS — R0602 Shortness of breath: Secondary | ICD-10-CM | POA: Diagnosis not present

## 2021-01-31 DIAGNOSIS — C8590 Non-Hodgkin lymphoma, unspecified, unspecified site: Secondary | ICD-10-CM | POA: Insufficient documentation

## 2021-01-31 DIAGNOSIS — Z885 Allergy status to narcotic agent status: Secondary | ICD-10-CM | POA: Diagnosis not present

## 2021-01-31 DIAGNOSIS — I082 Rheumatic disorders of both aortic and tricuspid valves: Secondary | ICD-10-CM | POA: Diagnosis not present

## 2021-01-31 DIAGNOSIS — C9110 Chronic lymphocytic leukemia of B-cell type not having achieved remission: Secondary | ICD-10-CM | POA: Diagnosis not present

## 2021-01-31 DIAGNOSIS — Z79899 Other long term (current) drug therapy: Secondary | ICD-10-CM | POA: Diagnosis not present

## 2021-01-31 DIAGNOSIS — Z87891 Personal history of nicotine dependence: Secondary | ICD-10-CM | POA: Insufficient documentation

## 2021-01-31 DIAGNOSIS — Z9049 Acquired absence of other specified parts of digestive tract: Secondary | ICD-10-CM | POA: Diagnosis not present

## 2021-01-31 DIAGNOSIS — R590 Localized enlarged lymph nodes: Secondary | ICD-10-CM | POA: Diagnosis not present

## 2021-01-31 DIAGNOSIS — Z905 Acquired absence of kidney: Secondary | ICD-10-CM | POA: Diagnosis not present

## 2021-01-31 LAB — CMP (CANCER CENTER ONLY)
ALT: 33 U/L (ref 0–44)
AST: 36 U/L (ref 15–41)
Albumin: 3.6 g/dL (ref 3.5–5.0)
Alkaline Phosphatase: 160 U/L — ABNORMAL HIGH (ref 38–126)
Anion gap: 12 (ref 5–15)
BUN: 17 mg/dL (ref 8–23)
CO2: 24 mmol/L (ref 22–32)
Calcium: 9.8 mg/dL (ref 8.9–10.3)
Chloride: 99 mmol/L (ref 98–111)
Creatinine: 1.51 mg/dL — ABNORMAL HIGH (ref 0.61–1.24)
GFR, Estimated: 48 mL/min — ABNORMAL LOW (ref >60)
Glucose, Bld: 118 mg/dL — ABNORMAL HIGH (ref 70–99)
Potassium: 3.5 mmol/L (ref 3.5–5.1)
Sodium: 135 mmol/L (ref 135–145)
Total Bilirubin: 1 mg/dL (ref 0.3–1.2)
Total Protein: 7.7 g/dL (ref 6.5–8.1)

## 2021-01-31 LAB — CBC WITH DIFFERENTIAL (CANCER CENTER ONLY)
Abs Immature Granulocytes: 0.05 10*3/uL (ref 0.00–0.07)
Basophils Absolute: 0 10*3/uL (ref 0.0–0.1)
Basophils Relative: 1 %
Eosinophils Absolute: 0.1 10*3/uL (ref 0.0–0.5)
Eosinophils Relative: 1 %
HCT: 35.6 % — ABNORMAL LOW (ref 39.0–52.0)
Hemoglobin: 11 g/dL — ABNORMAL LOW (ref 13.0–17.0)
Immature Granulocytes: 1 %
Lymphocytes Relative: 43 %
Lymphs Abs: 3.6 10*3/uL (ref 0.7–4.0)
MCH: 25.5 pg — ABNORMAL LOW (ref 26.0–34.0)
MCHC: 30.9 g/dL (ref 30.0–36.0)
MCV: 82.4 fL (ref 80.0–100.0)
Monocytes Absolute: 0.4 10*3/uL (ref 0.1–1.0)
Monocytes Relative: 5 %
Neutro Abs: 4.2 10*3/uL (ref 1.7–7.7)
Neutrophils Relative %: 49 %
Platelet Count: 266 10*3/uL (ref 150–400)
RBC: 4.32 MIL/uL (ref 4.22–5.81)
RDW: 16.7 % — ABNORMAL HIGH (ref 11.5–15.5)
WBC Count: 8.4 10*3/uL (ref 4.0–10.5)
nRBC: 0 % (ref 0.0–0.2)

## 2021-01-31 LAB — LACTATE DEHYDROGENASE: LDH: 171 U/L (ref 98–192)

## 2021-02-02 ENCOUNTER — Inpatient Hospital Stay: Payer: Medicare PPO | Admitting: Internal Medicine

## 2021-02-02 ENCOUNTER — Other Ambulatory Visit: Payer: Self-pay

## 2021-02-02 VITALS — BP 139/66 | HR 84 | Temp 98.6°F | Resp 18 | Ht 66.0 in | Wt 192.7 lb

## 2021-02-02 DIAGNOSIS — C3412 Malignant neoplasm of upper lobe, left bronchus or lung: Secondary | ICD-10-CM

## 2021-02-02 DIAGNOSIS — C8300 Small cell B-cell lymphoma, unspecified site: Secondary | ICD-10-CM | POA: Diagnosis not present

## 2021-02-02 DIAGNOSIS — C3490 Malignant neoplasm of unspecified part of unspecified bronchus or lung: Secondary | ICD-10-CM

## 2021-02-02 NOTE — Progress Notes (Signed)
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  PRIOR THERAPY: Status post left upper lobe wedge resection with lymph node sampling.  CURRENT THERAPY: Observation.  INTERVAL HISTORY: Bradley Irwin 75 y.o. male returns to the clinic today for follow-up visit accompanied by his wife.  The patient is feeling fine today with no concerning complaints.  He denied having any current chest pain but has shortness of breath with exertion with no cough or hemoptysis.  He denied having any fever or chills.  He has no nausea, vomiting, diarrhea or constipation.  He denied having any headache or visual changes.  He had repeat CT scan of the chest performed recently and he is here for evaluation and discussion of his scan results.    MEDICAL HISTORY: Past Medical History:  Diagnosis Date  . Anxiety   . Atypical mole 06/08/2010   Right Abdomen(moderate) (widershave)  . Atypical mole 04/25/1995   Left Upper ShoulderBlade(slight)  . Atypical mole 12/31/2013   Left Mid Back(Moderate) Mindi Slicker)  . Coronary artery disease   . Eczema   . GERD (gastroesophageal reflux disease)   . Hearing loss   . Hx of cardiovascular stress test    Lexiscan Myoview 5/16:  The study is normal. No ischemia identified. This is a low risk study. Overall left ventricular systolic function was normal. LV cavity size is normal. The left ventricular ejection fraction is normal (55-65%). Sensitivity reduced by bowel loop attenuation artifact.  . Hyperlipidemia   . Hypertension   . Hypothyroidism   . IBS (irritable bowel syndrome)   . Lung cancer  left upper lobe ([place]) 06/16/2019  . 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 Psoriasis   . PVD (peripheral vascular disease) ([place])   . Rash   . Renal cell cancer ([place])   . Wears glasses   . Wears hearing aid    both ears    ALLERGIES:  is allergic to sulfa antibiotics, demerol, and pseudoephedrine.  MEDICATIONS:  Current Outpatient Medications  Medication Sig Dispense Refill  . acetaminophen (TYLENOL) 500 MG tablet Take 500 mg by mouth every 6 (six) hours as needed for mild pain, fever or headache.     . ALPRAZolam (XANAX) 0.5 MG tablet Take 0.5 mg by mouth 2 (two) times daily as needed for anxiety.     Marland Kitchen amLODipine (NORVASC) 10 MG tablet Take 10 mg by mouth daily.     Marland Kitchen aspirin 81 MG tablet Take 81 mg by mouth daily.      Marland Kitchen atenolol (TENORMIN) 100 MG tablet Take 100 mg by mouth daily.    Marland Kitchen augmented betamethasone dipropionate (DIPROLENE-AF) 0.05 % cream Apply topically 2 (two) times daily. 30 g 1  . clidinium-chlordiazePOXIDE (LIBRAX) 5-2.5 MG capsule Take 2 capsules by mouth daily as needed. Takes for  IBS    . clopidogrel (PLAVIX) 75 MG tablet Take 1 tablet (75 mg total) by mouth daily. Please keep upcoming appt in January with Dr. Angelena Form for future refills. Thank you 90 tablet 0  . dicyclomine (BENTYL) 20 MG tablet     . diphenhydrAMINE (BENADRYL) 25 MG tablet Take 25 mg  by mouth at bedtime.    . hydrochlorothiazide (HYDRODIURIL) 25 MG tablet Take 25 mg by mouth daily.    Marland Kitchen ibuprofen (ADVIL) 200 MG tablet Take 200-400 mg by mouth every 6 (six) hours as needed.    Marland Kitchen levothyroxine (SYNTHROID) 137 MCG tablet Take 137 mcg by mouth daily before breakfast.    . omeprazole (PRILOSEC) 20 MG capsule Take 20 mg by mouth 2 (two) times daily.     . rosuvastatin (CRESTOR) 10 MG tablet Take 10 mg by mouth daily.    . simethicone (MYLICON) 939 MG chewable tablet Chew 125 mg by mouth every 6 (six) hours as needed for flatulence.    . valACYclovir (VALTREX) 500 MG tablet Take 500 mg by mouth 2 (two) times daily as needed (fever blisters).   0    No current facility-administered medications for this visit.    SURGICAL HISTORY:  Past Surgical History:  Procedure Laterality Date  . CARDIAC CATHETERIZATION    . CHOLECYSTECTOMY  10/26/11  . COLONOSCOPY    . CORONARY STENT PLACEMENT  2006  . FINGER SURGERY  75 y/o   cut index rt  . INTERCOSTAL NERVE BLOCK  06/16/2019   Procedure: Intercostal Nerve Block;  Surgeon: Grace Isaac, MD;  Location: [facility];  Service: Thoracic;;  . LIPOMA EXCISION Left 11/18/2013   Procedure: EXCISION POSTERIOR NECK LIPOMA ;  Surgeon: Stark Klein, MD;  Location: [place];  Service: General;  Laterality: Left;  . LIVER BIOPSY  09/18/11  . LIVER LOBECTOMY  10/26/11  . LYMPH NODE DISSECTION  06/16/2019   Procedure: Lymph Node Dissection;  Surgeon: Grace Isaac, MD;  Location: [facility];  Service: Thoracic;;  . NEPHRECTOMY  2008   right  . TONSILLECTOMY    . VIDEO ASSISTED THORACOSCOPY (VATS)/WEDGE RESECTION Left 06/16/2019   Procedure: VIDEO ASSISTED THORACOSCOPY (VATS)/MINI THOROCOTOMY WITH LEFT UPPER LOBE WEDGE RESECTION;  Surgeon: Grace Isaac, MD;  Location: [place];  Service: Thoracic;  Laterality: Left;  Marland Kitchen VIDEO BRONCHOSCOPY N/A 06/16/2019   Procedure: VIDEO BRONCHOSCOPY;  Surgeon: Grace Isaac, MD;  Location[facility];  Service: Thoracic;  Laterality: N/A;    REVIEW OF SYSTEMS:  A comprehensive review of systems was negative except for: Respiratory: positive for dyspnea on exertion   PHYSICAL EXAMINATION: General appearance: alert, cooperative and no distress Head: Normocephalic, without obvious abnormality, atraumatic Neck: no adenopathy, no JVD, supple, symmetrical, trachea midline and thyroid not enlarged, symmetric, no tenderness/mass/nodules Lymph nodes: Cervical, supraclavicular, and axillary nodes normal. Resp: clear to auscultation bilaterally Back: symmetric, no curvature. ROM normal. No CVA tenderness. Cardio: regular rate and rhythm, S1, S2 normal, no  murmur, click, rub or gallop GI: soft, non-tender; bowel sounds normal; no masses,  no organomegaly Extremities: extremities normal, atraumatic, no cyanosis or edema  ECOG PERFORMANCE STATUS: 1 - Symptomatic but completely ambulatory  Blood pressure 139/66, pulse 84, temperature 98.6 F (37 C), temperature source Tympanic, resp. rate 18, height '5\' 6"'  (1.676 m), weight 192 lb 11.2 oz (87.4 kg), SpO2 97 %.  LABORATORY DATA: Lab Results  Component Value Date   WBC 8.4 01/31/2021   HGB 11.0 (L) 01/31/2021   HCT 35.6 (L) 01/31/2021   MCV 82.4 01/31/2021   PLT 266 01/31/2021      Chemistry      Component Value Date/Time   NA 135 01/31/2021 1523   K 3.5 01/31/2021 1523   CL 99 01/31/2021 1523   CO2 24 01/31/2021 1523   BUN  17 01/31/2021 1523   CREATININE 1.51 (H) 01/31/2021 1523   CREATININE 1.30 (H) 02/06/2016 1714      Component Value Date/Time   CALCIUM 9.8 01/31/2021 1523   ALKPHOS 160 (H) 01/31/2021 1523   AST 36 01/31/2021 1523   ALT 33 01/31/2021 1523   BILITOT 1.0 01/31/2021 1523       RADIOGRAPHIC STUDIES: CT Chest Wo Contrast  Result Date: 02/01/2021 CLINICAL DATA:  Primary Cancer Type: Lung Imaging Indication: Routine surveillance Interval therapy since last imaging? No Initial Cancer Diagnosis Date: 06/16/2019; Established by: Biopsy-proven Detailed Pathology: Stage IA non-small cell lung cancer. Primary Tumor location: Left upper lobe.  Right upper lobe nodule. Surgeries: Left upper lobe wedge resection 06/16/2019. Coronary stent. Liver lobectomy 2012. Right nephrectomy 2008. Cholecystectomy. Chemotherapy: No Immunotherapy? No Radiation therapy? No Other Cancers: Small lymphocytic lymphoma diagnosed 02/09/2020. Renal cell carcinoma 2008. EXAM: CT CHEST WITHOUT CONTRAST TECHNIQUE: Multidetector CT imaging of the chest was performed following the standard protocol without IV contrast. COMPARISON:  Most recent CT chest 07/30/2020.  03/14/2019 PET-CT. FINDINGS:  Cardiovascular: The heart is normal in size. No pericardial effusion. No evidence of thoracic aortic aneurysm. Coronary atherosclerosis of the LAD and right coronary artery. Mediastinum/Nodes: Bulky bilateral axillary lymphadenopathy, measuring up to 1.8 cm short axis bilaterally, unchanged. Small mediastinal lymph nodes, measuring up to 11 mm in the subcarinal region and right azygoesophageal recess, unchanged. 19 mm right hilar node, unchanged. Visualized thyroid is unremarkable. Lungs/Pleura: Postsurgical changes related to left upper lobe wedge resection. Stable ovoid ground-glass nodule in the right lung apex (series 5/image 36), measuring 5 x 12 mm. No new/suspicious pulmonary nodules. Mild subpleural scarring in the lungs bilaterally, lower lobe predominant, favoring post infectious/inflammatory fibrosis. No focal consolidation. No pleural effusion or pneumothorax. Upper Abdomen: Visualized upper abdomen is notable for geographic hepatic steatosis, prior cholecystectomy, vascular calcifications, small upper abdominal lymph nodes, and a dominant 3.5 cm posterior splenic cyst. Musculoskeletal: Visualized osseous structures are within normal limits. IMPRESSION: Status post left upper lobe wedge resection. Stable thoracic lymphadenopathy, favoring indolent lymphoproliferative disorder. No findings specific for recurrent or metastatic disease. Electronically Signed   By: Julian Hy M.D.   On: 02/01/2021 10:47   ECHOCARDIOGRAM COMPLETE  Result Date: 01/25/2021    ECHOCARDIOGRAM REPORT   Patient Name:   Bradley Irwin Date of Exam: 01/25/2021 Medical Rec #:  [id number]        Height:       66.0 in Accession #:    [id number]       Weight:       195.6 lb Date of Birth:  1946-06-03       BSA:          1.981 m Patient Age:    71 years         BP:           124/82 mmHg Patient Gender: M                HR:           78 bpm. Exam Location:  [address] Procedure: 2D Echo, Cardiac Doppler, Color Doppler and  Intracardiac            Opacification Agent Indications:    I25.10 CAD Native Vessel  History:        Patient has prior history of Echocardiogram examinations, most                 recent 03/18/2013. CAD, Status post  left upper lobe resection,                 s/p resection. Status post LAD stent, PVD; Risk                 Factors:Hypertension, Dyslipidemia and Former Smoker. Status                 renal cell carcinoma s/p right nephrectomy, lung cancer s/p                 resection, lymphocytic lymphoma/CLL. Previous echo revealed mild                 LVH with LVEF of 65% and trivial AI.  Sonographer:    Lenard Galloway BA, RDCS Referring Phys: Dolores  Sonographer Comments: Technically difficult study due to poor echo windows. IMPRESSIONS  1. Left ventricular ejection fraction, by estimation, is 65 to 70%. The left ventricle has normal function. The left ventricle has no regional wall motion abnormalities. There is mild left ventricular hypertrophy. Left ventricular diastolic parameters are consistent with Grade I diastolic dysfunction (impaired relaxation).  2. Right ventricular systolic function is normal. The right ventricular size is normal. Tricuspid regurgitation signal is inadequate for assessing PA pressure.  3. The mitral valve is grossly normal. No evidence of mitral valve regurgitation.  4. The aortic valve is tricuspid. Aortic valve regurgitation is trivial. No aortic stenosis is present.  5. The inferior vena cava is normal in size with greater than 50% respiratory variability, suggesting right atrial pressure of 3 mmHg. Comparison(s): Changes from prior study are noted. 03/18/2013: LVEF 65%, trivial AI. FINDINGS  Left Ventricle: Left ventricular ejection fraction, by estimation, is 65 to 70%. The left ventricle has normal function. The left ventricle has no regional wall motion abnormalities. Definity contrast agent was given IV to delineate the left ventricular  endocardial borders.  The left ventricular internal cavity size was normal in size. There is mild left ventricular hypertrophy. Left ventricular diastolic parameters are consistent with Grade I diastolic dysfunction (impaired relaxation). Indeterminate filling pressures. Right Ventricle: The right ventricular size is normal. No increase in right ventricular wall thickness. Right ventricular systolic function is normal. Tricuspid regurgitation signal is inadequate for assessing PA pressure. Left Atrium: Left atrial size was normal in size. Right Atrium: Right atrial size was normal in size. Pericardium: There is no evidence of pericardial effusion. Mitral Valve: The mitral valve is grossly normal. No evidence of mitral valve regurgitation. Tricuspid Valve: The tricuspid valve is grossly normal. Tricuspid valve regurgitation is trivial. Aortic Valve: The aortic valve is tricuspid. Aortic valve regurgitation is trivial. No aortic stenosis is present. Pulmonic Valve: The pulmonic valve was normal in structure. Pulmonic valve regurgitation is not visualized. Aorta: The aortic root and ascending aorta are structurally normal, with no evidence of dilitation. Venous: The inferior vena cava is normal in size with greater than 50% respiratory variability, suggesting right atrial pressure of 3 mmHg. IAS/Shunts: No atrial level shunt detected by color flow Doppler.  LEFT VENTRICLE PLAX 2D LVIDd:         4.00 cm  Diastology LVIDs:         2.50 cm  LV e' medial:    5.00 cm/s LV PW:         1.50 cm  LV E/e' medial:  16.4 LV IVS:        1.20 cm  LV e' lateral:   7.72 cm/s LVOT diam:  2.00 cm  LV E/e' lateral: 10.6 LV SV:         65 LV SV Index:   33 LVOT Area:     3.14 cm  RIGHT VENTRICLE RV S prime:     12.10 cm/s TAPSE (M-mode): 2.4 cm LEFT ATRIUM             Index LA diam:        4.00 cm 2.02 cm/m LA Vol (A2C):   19.6 ml 9.89 ml/m LA Vol (A4C):   31.7 ml 16.00 ml/m LA Biplane Vol: 25.8 ml 13.02 ml/m  AORTIC VALVE LVOT Vmax:   96.00 cm/s LVOT  Vmean:  73.100 cm/s LVOT VTI:    0.208 m  AORTA Ao Root diam: 3.10 cm Ao Asc diam:  3.20 cm MITRAL VALVE MV Area (PHT): 3.27 cm    SHUNTS MV Decel Time: 232 msec    Systemic VTI:  0.21 m MV E velocity: 82.00 cm/s  Systemic Diam: 2.00 cm MV A velocity: 77.70 cm/s MV E/A ratio:  1.06 Lyman Bishop MD Electronically signed by Lyman Bishop MD Signature Date/Time: 01/25/2021/5:22:45 PM    Final     ASSESSMENT AND PLAN: This is a very pleasant 75 years old white male with stage Ia non-small cell lung cancer, adenocarcinoma with positive MET 14 splice mutation status post wedge resection of the left upper lobe with lymph node sampling in June 2020.  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 The patient is currently on observation and he is feeling fine today with no concerning complaints except for the baseline shortness of breath. He had repeat CT scan of the chest performed recently.  I personally and independently reviewed the scans and discussed the results with the patient and his wife. His scan showed no concerning findings for disease recurrence or metastasis. I recommended for him to continue on observation with repeat CT scan of the chest in 6 months. The patient was advised to call immediately if he has any other concerning symptoms in the interval. The patient voices understanding of current disease status and treatment options and is in agreement with the current care plan.  All questions were answered. The patient knows to call the clinic with any problems, questions or concerns. We can certainly see the patient much sooner if necessary.  Disclaimer: This note was dictated with voice recognition software. Similar sounding words can inadvertently be transcribed and may not be corrected upon review.

## 2021-08-02 ENCOUNTER — Ambulatory Visit (HOSPITAL_COMMUNITY)
Admission: RE | Admit: 2021-08-02 | Discharge: 2021-08-02 | Disposition: A | Discharge status: Home / Self Care | Payer: Medicare PPO | Source: Ambulatory Visit | Attending: Internal Medicine | Admitting: Internal Medicine

## 2021-08-02 ENCOUNTER — Other Ambulatory Visit: Payer: Self-pay

## 2021-08-02 DIAGNOSIS — C3490 Malignant neoplasm of unspecified part of unspecified bronchus or lung: Secondary | ICD-10-CM | POA: Diagnosis present

## 2021-08-03 ENCOUNTER — Inpatient Hospital Stay: Payer: Medicare PPO | Attending: Internal Medicine

## 2021-08-03 ENCOUNTER — Inpatient Hospital Stay: Payer: Medicare PPO | Admitting: Internal Medicine

## 2021-08-03 VITALS — BP 139/68 | HR 75 | Temp 98.4°F | Resp 19 | Wt 188.6 lb

## 2021-08-03 DIAGNOSIS — C3412 Malignant neoplasm of upper lobe, left bronchus or lung: Secondary | ICD-10-CM | POA: Diagnosis present

## 2021-08-03 DIAGNOSIS — Z85528 Personal history of other malignant neoplasm of kidney: Secondary | ICD-10-CM | POA: Insufficient documentation

## 2021-08-03 DIAGNOSIS — Z885 Allergy status to narcotic agent status: Secondary | ICD-10-CM | POA: Diagnosis not present

## 2021-08-03 DIAGNOSIS — N1831 Chronic kidney disease, stage 3a: Secondary | ICD-10-CM | POA: Insufficient documentation

## 2021-08-03 DIAGNOSIS — C3490 Malignant neoplasm of unspecified part of unspecified bronchus or lung: Secondary | ICD-10-CM

## 2021-08-03 DIAGNOSIS — Z9049 Acquired absence of other specified parts of digestive tract: Secondary | ICD-10-CM | POA: Diagnosis not present

## 2021-08-03 DIAGNOSIS — R5383 Other fatigue: Secondary | ICD-10-CM | POA: Insufficient documentation

## 2021-08-03 DIAGNOSIS — M109 Gout, unspecified: Secondary | ICD-10-CM | POA: Diagnosis not present

## 2021-08-03 DIAGNOSIS — Z882 Allergy status to sulfonamides status: Secondary | ICD-10-CM | POA: Diagnosis not present

## 2021-08-03 DIAGNOSIS — Z79899 Other long term (current) drug therapy: Secondary | ICD-10-CM | POA: Insufficient documentation

## 2021-08-03 DIAGNOSIS — Z905 Acquired absence of kidney: Secondary | ICD-10-CM | POA: Diagnosis not present

## 2021-08-03 LAB — CBC WITH DIFFERENTIAL (CANCER CENTER ONLY)
Abs Immature Granulocytes: 0.04 10*3/uL (ref 0.00–0.07)
Basophils Absolute: 0 10*3/uL (ref 0.0–0.1)
Basophils Relative: 1 %
Eosinophils Absolute: 0.1 10*3/uL (ref 0.0–0.5)
Eosinophils Relative: 1 %
HCT: 33 % — ABNORMAL LOW (ref 39.0–52.0)
Hemoglobin: 10.5 g/dL — ABNORMAL LOW (ref 13.0–17.0)
Immature Granulocytes: 1 %
Lymphocytes Relative: 30 %
Lymphs Abs: 2.6 10*3/uL (ref 0.7–4.0)
MCH: 26.3 pg (ref 26.0–34.0)
MCHC: 31.8 g/dL (ref 30.0–36.0)
MCV: 82.7 fL (ref 80.0–100.0)
Monocytes Absolute: 0.5 10*3/uL (ref 0.1–1.0)
Monocytes Relative: 6 %
Neutro Abs: 5.3 10*3/uL (ref 1.7–7.7)
Neutrophils Relative %: 61 %
Platelet Count: 198 10*3/uL (ref 150–400)
RBC: 3.99 MIL/uL — ABNORMAL LOW (ref 4.22–5.81)
RDW: 16.2 % — ABNORMAL HIGH (ref 11.5–15.5)
WBC Count: 8.6 10*3/uL (ref 4.0–10.5)
nRBC: 0 % (ref 0.0–0.2)

## 2021-08-03 LAB — CMP (CANCER CENTER ONLY)
ALT: 32 U/L (ref 0–44)
AST: 44 U/L — ABNORMAL HIGH (ref 15–41)
Albumin: 3.6 g/dL (ref 3.5–5.0)
Alkaline Phosphatase: 170 U/L — ABNORMAL HIGH (ref 38–126)
Anion gap: 14 (ref 5–15)
BUN: 13 mg/dL (ref 8–23)
CO2: 24 mmol/L (ref 22–32)
Calcium: 10.1 mg/dL (ref 8.9–10.3)
Chloride: 100 mmol/L (ref 98–111)
Creatinine: 1.57 mg/dL — ABNORMAL HIGH (ref 0.61–1.24)
GFR, Estimated: 46 mL/min — ABNORMAL LOW (ref >60)
Glucose, Bld: 123 mg/dL — ABNORMAL HIGH (ref 70–99)
Potassium: 3.6 mmol/L (ref 3.5–5.1)
Sodium: 138 mmol/L (ref 135–145)
Total Bilirubin: 0.6 mg/dL (ref 0.3–1.2)
Total Protein: 7.1 g/dL (ref 6.5–8.1)

## 2021-08-03 NOTE — Progress Notes (Signed)
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  PRIOR THERAPY: Status post left upper lobe wedge resection with lymph node sampling.  CURRENT THERAPY: Observation.  INTERVAL HISTORY: Bradley Irwin 75 y.o. male returns to the clinic today for 23-monthfollow-up visit accompanied by his wife.  The patient is feeling fine today with no concerning complaints except for recent episode of gout and he was treated by his primary care physician and feeling better.  He is currently on prophylactic allopurinol.  He was seen also by nephrology for his chronic kidney disease and he was diagnosed with stage IIIa chronic kidney disease.  He denied having any chest pain, shortness of breath, cough or hemoptysis.  He denied having any nausea, vomiting, diarrhea or constipation.  He denied having any headache or visual changes.  He is here today for evaluation with repeat CT scan of the chest for restaging of his disease.   MEDICAL HISTORY: Past Medical History:  Diagnosis Date   Anxiety    Atypical mole 06/08/2010   Right Abdomen(moderate) ([place] Ir[place]   Atypical mole 04/25/1995   Left Upper ShoulderBlade(slight)   Atypical mole 12/31/2013   Left Mid Back(Moderate) (Bradley Irwin   Coronary artery disease    Eczema    GERD (gastroesophageal reflux disease)    Hearing loss    Hx of cardiovascular stress test    Lexiscan Myoview 5/16:  The study is normal. No ischemia identified. This is a low risk study. Overall left ventricular systolic function was normal. LV cavity size is normal. The left ventricular ejection fraction is normal (55-65%). Sensitivity reduced by bowel loop attenuation artifact.    Hyperlipidemia    Hypertension    Hypothyroidism    IBS (irritable bowel syndrome)    [place]  [place] 06/[place]   [place] 07/30/2013   Left Scalp   Psoriasis    PVD (peripheral vascular disease) ([place]    Rash    [place] cell cancer [place]    Wears glasses    Wears hearing aid    both ears    ALLERGIES:  is allergic to sulfa antibiotics, demerol, and pseudoephedrine.  MEDICATIONS:  Current Outpatient Medications  Medication Sig Dispense Refill   acetaminophen (TYLENOL) 500 MG tablet Take 500 mg by mouth every 6 (six) hours as needed for mild pain, fever or headache.      ALPRAZolam (XANAX) 0.5 MG tablet Take 0.5 mg by mouth 2 (two) times daily as needed for anxiety.      amLODipine (NORVASC) 10 MG tablet Take 10 mg by mouth daily.      aspirin 81 MG tablet Take 81 mg by mouth daily.       atenolol (TENORMIN) 100 MG tablet Take 100 mg by mouth daily.     augmented betamethasone dipropionate (DIPROLENE-AF) 0.05 % cream Apply topically 2 (two) times daily. (Patient not taking: Reported on 02/02/2021) 30 g 1   clidinium-chlordiazePOXIDE (LIBRAX) 5-2.5 MG capsule Take 2 capsules by mouth daily as needed. Takes for  IBS     clopidogrel (PLAVIX) 75 MG tablet Take 1 tablet (75 mg total) by mouth daily. Please keep  upcoming appt in January with Dr. Angelena Form for future refills. Thank you 90 tablet 0   dicyclomine (BENTYL) 20 MG tablet  (Patient not taking: Reported on 02/02/2021)     diphenhydrAMINE (BENADRYL) 25 MG tablet Take 25 mg by mouth at bedtime.     hydrochlorothiazide (HYDRODIURIL) 25 MG tablet Take 25 mg by mouth daily.     ibuprofen (ADVIL) 200 MG tablet Take 200-400 mg by mouth every 6 (six) hours as needed.     levothyroxine (SYNTHROID) 137 MCG tablet Take 137 mcg by mouth daily before breakfast.     omeprazole (PRILOSEC) 20 MG capsule Take 20 mg by mouth 2 (two) times daily.      rosuvastatin (CRESTOR) 10 MG tablet Take 10 mg by mouth daily.      simethicone (MYLICON) 888 MG chewable tablet Chew 125 mg by mouth every 6 (six) hours as needed for flatulence.     valACYclovir (VALTREX) 500 MG tablet Take 500 mg by mouth 2 (two) times daily as needed (fever blisters).  (Patient not taking: Reported on 02/02/2021)  0   No current facility-administered medications for this visit.    SURGICAL HISTORY:  Past Surgical History:  Procedure Laterality Date   CARDIAC CATHETERIZATION     CHOLECYSTECTOMY  10/26/11   COLONOSCOPY     CORONARY STENT PLACEMENT  2006   FINGER SURGERY  75 y/o   cut index rt   INTERCOSTAL NERVE BLOCK  06/16/2019   Procedure: Intercostal Nerve Block;  Surgeon: Grace Isaac, MD;  Location: [place];  Service: Thoracic;;   LIPOMA EXCISION Left 11/18/2013   Procedure: EXCISION POSTERIOR NECK LIPOMA ;  Surgeon: Stark Klein, MD;  Location: [place];  Service: General;  Laterality: Left;   LIVER BIOPSY  09/18/11   LIVER LOBECTOMY  10/26/11   LYMPH NODE DISSECTION  06/16/2019   Procedure: Lymph Node Dissection;  Surgeon: Grace Isaac, MD;  Location: [place];  Service: Thoracic;;   NEPHRECTOMY  2008   right   TONSILLECTOMY     VIDEO ASSISTED THORACOSCOPY (VATS)/WEDGE RESECTION Left 06/16/2019   Procedure: VIDEO ASSISTED THORACOSCOPY (VATS)/MINI THOROCOTOMY WITH LEFT UPPER LOBE WEDGE RESECTION;  Surgeon: Grace Isaac, MD;  Location: [facility];  Service: Thoracic;  Laterality: Left;   VIDEO BRONCHOSCOPY N/A 06/16/2019   Procedure: VIDEO BRONCHOSCOPY;  Surgeon: Grace Isaac, MD;  Location: [facility];  Service: Thoracic;  Laterality: N/A;    REVIEW OF SYSTEMS:  A comprehensive review of systems was negative except for: Constitutional: positive for fatigue   PHYSICAL EXAMINATION: General appearance: alert, cooperative, and no distress Head: Normocephalic, without obvious abnormality, atraumatic Neck: no adenopathy, no JVD, supple, symmetrical, trachea midline, and thyroid not enlarged, symmetric, no  tenderness/mass/nodules Lymph nodes: Cervical, supraclavicular, and axillary nodes normal. Resp: clear to auscultation bilaterally Back: symmetric, no curvature. ROM normal. No CVA tenderness. Cardio: regular rate and rhythm, S1, S2 normal, no murmur, click, rub or gallop GI: soft, non-tender; bowel sounds normal; no masses,  no organomegaly Extremities: extremities normal, atraumatic, no cyanosis or edema  ECOG PERFORMANCE STATUS: 1 - Symptomatic but completely ambulatory  Blood pressure 139/68, pulse 75, temperature 98.4 F (36.9 C), temperature source Oral, resp. rate 19, weight 188 lb 9.6 oz (85.5 kg), SpO2 98 %.  LABORATORY DATA: Lab Results  Component Value Date   WBC 8.6 08/03/2021   HGB 10.5 (L) 08/03/2021   HCT 33.0 (L) 08/03/2021   MCV 82.7 08/03/2021   PLT 198 08/03/2021  Chemistry      Component Value Date/Time   NA 135 01/31/2021 1523   K 3.5 01/31/2021 1523   CL 99 01/31/2021 1523   CO2 24 01/31/2021 1523   BUN 17 01/31/2021 1523   CREATININE 1.51 (H) 01/31/2021 1523   CREATININE 1.30 (H) 02/06/2016 1714      Component Value Date/Time   CALCIUM 9.8 01/31/2021 1523   ALKPHOS 160 (H) 01/31/2021 1523   AST 36 01/31/2021 1523   ALT 33 01/31/2021 1523   BILITOT 1.0 01/31/2021 1523       RADIOGRAPHIC STUDIES: No results found.   ASSESSMENT AND PLAN: This is a very pleasant 75 years old white male with stage Ia non-small cell lung cancer, adenocarcinoma with positive MET 14 splice mutation status post wedge resection of the left upper lobe with lymph node sampling in June 2020.  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 The patient is currently on observation and he is feeling fine today with no concerning complaints. He had repeat CT scan of the chest performed yesterday.  Unfortunately the final report for the scan is not available but I personally reviewed the images and I did not see any concerning findings  but will wait for the final report for confirmation. I recommended for the patient to continue on observation with repeat CT scan of the chest in 6 months. He was advised to call immediately if he has any other concerning symptoms in the interval. The patient voices understanding of current disease status and treatment options and is in agreement with the current care plan.  All questions were answered. The patient knows to call the clinic with any problems, questions or concerns. We can certainly see the patient much sooner if necessary.  Disclaimer: This note was dictated with voice recognition software. Similar sounding words can inadvertently be transcribed and may not be corrected upon review.

## 2021-08-09 ENCOUNTER — Telehealth: Payer: Self-pay | Admitting: Internal Medicine

## 2021-08-09 NOTE — Telephone Encounter (Signed)
Scheduled per los. Called and left msg. Mailed printout  °

## 2021-11-08 ENCOUNTER — Encounter: Payer: Self-pay | Admitting: Gastroenterology

## 2021-11-08 ENCOUNTER — Ambulatory Visit: Payer: Medicare PPO | Admitting: Gastroenterology

## 2021-11-08 ENCOUNTER — Other Ambulatory Visit (INDEPENDENT_AMBULATORY_CARE_PROVIDER_SITE_OTHER): Payer: Medicare PPO

## 2021-11-08 VITALS — BP 110/64 | HR 120 | Ht 65.0 in | Wt 180.0 lb

## 2021-11-08 DIAGNOSIS — K760 Fatty (change of) liver, not elsewhere classified: Secondary | ICD-10-CM

## 2021-11-08 DIAGNOSIS — R7989 Other specified abnormal findings of blood chemistry: Secondary | ICD-10-CM | POA: Diagnosis not present

## 2021-11-08 DIAGNOSIS — R932 Abnormal findings on diagnostic imaging of liver and biliary tract: Secondary | ICD-10-CM | POA: Diagnosis not present

## 2021-11-08 LAB — IBC + FERRITIN
Ferritin: 38.6 ng/mL (ref 22.0–322.0)
Iron: 116 ug/dL (ref 42–165)
Saturation Ratios: 25.5 % (ref 20.0–50.0)
TIBC: 455 ug/dL — ABNORMAL HIGH (ref 250.0–450.0)
Transferrin: 325 mg/dL (ref 212.0–360.0)

## 2021-11-08 LAB — COMPREHENSIVE METABOLIC PANEL
ALT: 40 U/L (ref 0–53)
AST: 43 U/L — ABNORMAL HIGH (ref 0–37)
Albumin: 4.5 g/dL (ref 3.5–5.2)
Alkaline Phosphatase: 172 U/L — ABNORMAL HIGH (ref 39–117)
BUN: 27 mg/dL — ABNORMAL HIGH (ref 6–23)
CO2: 25 mEq/L (ref 19–32)
Calcium: 10.3 mg/dL (ref 8.4–10.5)
Chloride: 104 mEq/L (ref 96–112)
Creatinine, Ser: 1.94 mg/dL — ABNORMAL HIGH (ref 0.40–1.50)
GFR: 33.62 mL/min — ABNORMAL LOW (ref >60.00)
Glucose, Bld: 99 mg/dL (ref 70–99)
Potassium: 4.2 mEq/L (ref 3.5–5.1)
Sodium: 138 mEq/L (ref 135–145)
Total Bilirubin: 0.5 mg/dL (ref 0.2–1.2)
Total Protein: 7.6 g/dL (ref 6.0–8.3)

## 2021-11-08 LAB — PROTIME-INR
INR: 1 ratio (ref 0.8–1.0)
Prothrombin Time: 11.3 s (ref 9.6–13.1)

## 2021-11-08 NOTE — Patient Instructions (Signed)
Your provider has requested that you go to the basement level for lab work before leaving today. Press "B" on the elevator. The lab is located at the first door on the left as you exit the elevator.  You have been scheduled for a CT scan of the abdomen and pelvis at [place] [facility] ([address]---this is in the same building as [employer]).   You are scheduled on ____________ at ____________. You should arrive 15 minutes prior to your appointment time for registration. Please follow the written instructions below on the day of your exam:  WARNING: IF YOU ARE ALLERGIC TO IODINE/X-RAY DYE, PLEASE NOTIFY RADIOLOGY IMMEDIATELY AT [phone]! YOU WILL BE GIVEN A 13 HOUR PREMEDICATION PREP.  1) Do not eat or drink anything after ___________ (4 hours prior to your test) 2) You have been given 2 bottles of oral contrast to drink. The solution may taste better if refrigerated, but do NOT add ice or any other liquid to this solution. Shake well before drinking.    Drink 1 bottle of contrast @ ___________ (2 hours prior to your exam)  Drink 1 bottle of contrast @ ___________ (1 hour prior to your exam)  You may take any medications as prescribed with a small amount of water, if necessary. If you take any of the following medications: METFORMIN, GLUCOPHAGE, GLUCOVANCE, AVANDAMET, RIOMET, FORTAMET, Rich MET, JANUMET, GLUMETZA or METAGLIP, you MAY be asked to HOLD this medication 48 hours AFTER the exam.  The purpose of you drinking the oral contrast is to aid in the visualization of your intestinal tract. The contrast solution may cause some diarrhea. Depending on your individual set of symptoms, you may also receive an intravenous injection of x-ray contrast/dye. Plan on being at [facility] for 30 minutes or longer, depending on the type of exam you are having performed.  This test typically takes 30-45 minutes to complete.  If you have any questions regarding your exam  or if you need to reschedule, you may call the CT department at [phone] between the hours of 8:00 am and 5:00 pm, Monday-Friday.  ________________________________________________________________________ Due to recent changes in healthcare laws, you may see the results of your imaging and laboratory studies on MyChart before your provider has had a chance to review them.  We understand that in some cases there may be results that are confusing or concerning to you. Not all laboratory results come back in the same time frame and the provider may be waiting for multiple results in order to interpret others.  Please give [place] 48 hours in order for your provider to thoroughly review all the results before contacting the office for clarification of your results.   The [facility] providers would like to encourage you to use [facility] to communicate with providers for non-urgent requests or questions.  Due to long hold times on the telephone, sending your provider a message by [facility] may be a faster and more efficient way to get a response.  Please allow 48 business hours for a response.  Please remember that this is for non-urgent requests.   Thank you for choosing me and [facility].  Pricilla Riffle. Dagoberto Ligas., MD., [place] [facility]

## 2021-11-08 NOTE — Progress Notes (Signed)
History of Present Illness: This is a 75 year old male referred by Bradley Boston, MD for the evaluation of elevated LFTs and an abnormal CT of the liver.  He underwent resection of liver segment 5 and cholecystectomy for focal nodular hyperplasia in 2012.  He has a history of left upper lobe lung cancer currently felt to be in remission status post LUL resection in 2020, history of small cell lymphoma/CLL diagnosed in 2021 and a history of renal cancer status post right nephrectomy in 2008.  Mild alk phos elevation has been noted for the past 2 years.  In October AST 78 ALT 72 alk phos 216 gamma GT 586. He discontinued all alcohol use 2 months ago with resolution of nausea. Chest CT on August 02, 2021 showed a slightly heterogeneous attenuation with focal areas of hypoattenuation in the periphery.  The liver margin was slightly irregular.  Findings were consistent with hepatic steatosis and concerning for cirrhosis.  There is no family history of liver disease.  He has GERD that is well controlled.  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  Dicyclomine has been helpful in controlling symptoms.  Colonoscopy in January 2014 showed mild left colon diverticulosis and internal hemorrhoids.  Denies weight loss, abdominal pain, change in stool caliber, melena, hematochezia, nausea, vomiting, dysphagia, chest pain.    Allergies  Allergen Reactions   Sulfa Antibiotics Other (See Comments)    Happened when he was child. Does not remember reaction.   Demerol Other (See Comments)    Severe headache.   Pseudoephedrine Other (See Comments)    Hypertension    Outpatient Medications Prior to Visit  Medication Sig Dispense Refill   acetaminophen (TYLENOL) 500 MG tablet Take 500 mg by mouth every 6 (six) hours as needed for mild pain, fever or headache.      allopurinol (ZYLOPRIM) 100 MG tablet Take 100 mg by mouth  daily.     ALPRAZolam (XANAX) 0.5 MG tablet Take 0.5 mg by mouth 2 (two) times daily as needed for anxiety.      amLODipine (NORVASC) 10 MG tablet Take 10 mg by mouth daily.      aspirin 81 MG tablet Take 81 mg by mouth daily.       augmented betamethasone dipropionate (DIPROLENE-AF) 0.05 % cream Apply topically 2 (two) times daily. 30 g 1   clidinium-chlordiazePOXIDE (LIBRAX) 5-2.5 MG capsule Take 2 capsules by mouth daily as needed. Takes for  IBS     clopidogrel (PLAVIX) 75 MG tablet Take 1 tablet (75 mg total) by mouth daily. Please keep upcoming appt in January with Dr. Angelena Form for future refills. Thank you 90 tablet 0   Colchicine 0.6 MG CAPS Take 1 tablet by mouth as needed.     dicyclomine (BENTYL) 20 MG tablet      diphenhydrAMINE (BENADRYL) 25 MG tablet Take 25 mg by mouth at bedtime.     ferrous sulfate 325 (65 FE) MG tablet Take 1 tablet by mouth daily.     hydrochlorothiazide (HYDRODIURIL) 25 MG tablet Take 25 mg by mouth daily.     hydrOXYzine (ATARAX/VISTARIL) 25 MG tablet Take 25 mg by mouth as needed.     ibuprofen (ADVIL) 200 MG tablet Take 200-400 mg by mouth every 6 (six) hours as needed.     levothyroxine (SYNTHROID) 150 MCG tablet Take 150 mcg by mouth daily before breakfast.     nitroGLYCERIN (  NITRODUR - DOSED IN MG/24 HR) 0.4 mg/hr patch Place 0.4 mg onto the skin daily as needed.     olmesartan (BENICAR) 5 MG tablet Take 2 tablets by mouth daily.     omeprazole (PRILOSEC) 20 MG capsule Take 20 mg by mouth 2 (two) times daily.      rosuvastatin (CRESTOR) 10 MG tablet Take 10 mg by mouth daily.     simethicone (MYLICON) 563 MG chewable tablet Chew 125 mg by mouth every 6 (six) hours as needed for flatulence.     valACYclovir (VALTREX) 500 MG tablet Take 500 mg by mouth 2 (two) times daily as needed (fever blisters).  0   atenolol (TENORMIN) 100 MG tablet Take 100 mg by mouth daily.     levothyroxine (SYNTHROID) 137 MCG tablet Take 137 mcg by mouth daily before breakfast.      No facility-administered medications prior to visit.   Past Medical History:  Diagnosis Date   Anxiety    Atypical mole 06/08/2010   Right Abdomen(moderate) Mindi Slicker)   Atypical mole 04/25/1995   Left Upper ShoulderBlade(slight)   Atypical mole 12/31/2013   Left Mid Back(Moderate) Mindi Slicker)   Colon polyps    Coronary artery disease    Eczema    GERD (gastroesophageal reflux disease)    Gout    Hearing loss    Hx of cardiovascular stress test    Lexiscan Myoview 5/16:  The study is normal. No ischemia identified. This is a low risk study. Overall left ventricular systolic function was normal. LV cavity size is normal. The left ventricular ejection fraction is normal (55-65%). Sensitivity reduced by bowel loop attenuation artifact.   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   Pneumonia    Psoriasis    PVD (peripheral vascular disease) (HCC)    Rash    Renal cell cancer ([place])    Squamous cell carcinoma in situ    Wears glasses    Wears hearing aid    both ears   Past Surgical History:  Procedure Laterality Date   CARDIAC CATHETERIZATION     CHOLECYSTECTOMY  10/26/2011   COLONOSCOPY     CORONARY STENT PLACEMENT  2006   FINGER SURGERY Right 75 y/o   cut index rt   INTERCOSTAL NERVE BLOCK  06/16/2019   Procedure: Intercostal Nerve Block;  Surgeon: Grace Isaac, MD;  Location: [place];  Service: Thoracic;;   LIPOMA EXCISION Left 11/18/2013   Procedure: EXCISION POSTERIOR NECK LIPOMA ;  Surgeon: Shellyann Wandrey Klein, MD;  Location: [place];  Service: General;  Laterality: Left;   LIVER BIOPSY  09/18/2011   LIVER LOBECTOMY  10/26/2011   LYMPH NODE DISSECTION  06/16/2019   Procedure: Lymph Node Dissection;  Surgeon: Grace Isaac, MD;  Location: [place];  Service: Thoracic;;   NEPHRECTOMY Right 2008   right    TONSILLECTOMY     VIDEO ASSISTED THORACOSCOPY (VATS)/WEDGE RESECTION Left 06/16/2019   Procedure: VIDEO ASSISTED THORACOSCOPY (VATS)/MINI THOROCOTOMY WITH LEFT UPPER LOBE WEDGE RESECTION;  Surgeon: Grace Isaac, MD;  Location: [place];  Service: Thoracic;  Laterality: Left;   VIDEO BRONCHOSCOPY N/A 06/16/2019   Procedure: VIDEO BRONCHOSCOPY;  Surgeon: Grace Isaac, MD;  Location: [facility];  Service: Thoracic;  Laterality: N/A;   Social History   Socioeconomic History  Marital status: Married    Spouse name: Not on file   Number of children: 0   Years of education: Not on file   Highest education level: Not on file  Occupational History   Occupation: Retired from state of [place]  Tobacco Use   Smoking status: Former    Packs/day: 1.00    Years: 15.00    Pack years: 15.00    Types: Cigarettes    Quit date: 10/26/1983    Years since quitting: 38.0   Smokeless tobacco: Never  Vaping Use   Vaping Use: Never used  Substance and Sexual Activity   Alcohol use: Not Currently    Alcohol/week: 14.0 - 20.0 standard drinks    Types: 14 - 20 Glasses of wine per week    Comment: 2-3 per day - wine, beer, liquor   Drug use: No   Sexual activity: Not on file  Other Topics Concern   Not on file  Social History Narrative   Not on file   Social Determinants of Health   Financial Resource Strain: Not on file  Food Insecurity: Not on file  Transportation Needs: Not on file  Physical Activity: Not on file  Stress: Not on file  Social Connections: Not on file   Family History  Problem Relation Age of Onset   Hypertension Mother    Heart failure Mother    Heart disease Mother    Aneurysm Mother        brain   Peripheral vascular disease Father    Lung cancer Father    Hypertension Sister    Breast cancer Sister    Heart attack Maternal Grandfather    Heart attack Maternal Uncle    Colon cancer Neg Hx      Review of Systems: Pertinent positive and negative review of  systems were noted in the above HPI section. All other review of systems were otherwise negative.   Physical Exam: General: Well developed, well nourished, no acute distress Head: Normocephalic and atraumatic Eyes: Sclerae anicteric, EOMI Ears: Normal auditory acuity Mouth: Not examined, mask on during Covid-19 pandemic Neck: Supple, no masses or thyromegaly Lungs: Clear throughout to auscultation Heart: Regular rate and rhythm; no murmurs, rubs or bruits Abdomen: Soft, non tender and non distended. No masses, hepatosplenomegaly or hernias noted. Normal Bowel sounds Rectal: Not done Musculoskeletal: Symmetrical with no gross deformities  Skin: No lesions on visible extremities Pulses:  Normal pulses noted Extremities: No clubbing, cyanosis, edema or deformities noted Neurological: Alert oriented x 4, grossly nonfocal Cervical Nodes:  No significant cervical adenopathy Inguinal Nodes: No significant inguinal adenopathy Psychological:  Alert and cooperative. Normal mood and affect   Assessment and Recommendations:  Elevated transaminases and alkaline phosphatase, abnormal CT of the liver, hepatic steatosis.  Rule out cirrhosis.  Rule out viral, metabolic, genetic liver disease.  Obtain standard hepatic serologies, LFTs and PT/INR.  Schedule CT AP. REV in 6 weeks.  S/P Segment 5 hepatic resection in 2012 for focal nodular hyperplasia.  IBS, alternating pattern.  Avoid foods and beverages that exacerbate symptoms.  Continue dicyclomine 20 mg p.o. 3 times daily as needed GERD.  Follow antireflux measures.  Continue omeprazole 20 mg p.o. twice daily. S/P LUL lung resection for Stage I adenocarcinoma in 2020.  Status post right nephrectomy for renal cancer in 2008 CLL diagnosed in 2021   cc: Bradley Boston, MD [id number] [place],  [place] [id number]

## 2021-11-09 ENCOUNTER — Other Ambulatory Visit: Payer: Self-pay

## 2021-11-09 ENCOUNTER — Ambulatory Visit (INDEPENDENT_AMBULATORY_CARE_PROVIDER_SITE_OTHER)
Admission: RE | Admit: 2021-11-09 | Discharge: 2021-11-09 | Disposition: A | Discharge status: Home / Self Care | Payer: Medicare PPO | Source: Ambulatory Visit | Attending: Gastroenterology | Admitting: Gastroenterology

## 2021-11-09 DIAGNOSIS — K760 Fatty (change of) liver, not elsewhere classified: Secondary | ICD-10-CM | POA: Diagnosis not present

## 2021-11-09 DIAGNOSIS — R932 Abnormal findings on diagnostic imaging of liver and biliary tract: Secondary | ICD-10-CM | POA: Diagnosis not present

## 2021-11-09 DIAGNOSIS — R7989 Other specified abnormal findings of blood chemistry: Secondary | ICD-10-CM | POA: Diagnosis not present

## 2021-11-10 ENCOUNTER — Other Ambulatory Visit: Payer: Self-pay

## 2021-11-10 DIAGNOSIS — R932 Abnormal findings on diagnostic imaging of liver and biliary tract: Secondary | ICD-10-CM

## 2021-11-10 DIAGNOSIS — R7989 Other specified abnormal findings of blood chemistry: Secondary | ICD-10-CM

## 2021-11-10 LAB — HEPATITIS C ANTIBODY
Hepatitis C Ab: NONREACTIVE
SIGNAL TO CUT-OFF: 0.04 (ref <1.00)

## 2021-11-10 LAB — ALPHA-1-ANTITRYPSIN: A-1 Antitrypsin, Ser: 194 mg/dL (ref 83–199)

## 2021-11-10 LAB — HEPATITIS B SURFACE ANTIGEN: Hepatitis B Surface Ag: NONREACTIVE

## 2021-11-10 LAB — HEPATITIS A ANTIBODY, TOTAL: Hepatitis A AB,Total: NONREACTIVE

## 2021-11-10 LAB — TISSUE TRANSGLUTAMINASE, IGA: (tTG) Ab, IgA: <1 U/mL

## 2021-11-10 LAB — MITOCHONDRIAL ANTIBODIES: Mitochondrial M2 Ab, IgG: <=20 U (ref <=20.0)

## 2021-11-10 LAB — ANA: Anti Nuclear Antibody (ANA): NEGATIVE

## 2021-11-10 LAB — IGA: Immunoglobulin A: 95 mg/dL (ref 70–320)

## 2021-11-10 LAB — HEPATITIS B SURFACE ANTIBODY,QUALITATIVE: Hep B S Ab: NONREACTIVE

## 2021-11-10 LAB — CERULOPLASMIN: Ceruloplasmin: 34 mg/dL (ref 18–36)

## 2021-11-10 LAB — ANTI-SMOOTH MUSCLE ANTIBODY, IGG: Actin (Smooth Muscle) Antibody (IGG): <20 U (ref <20)

## 2021-12-08 ENCOUNTER — Other Ambulatory Visit: Payer: Self-pay

## 2021-12-08 ENCOUNTER — Ambulatory Visit
Admission: RE | Admit: 2021-12-08 | Discharge: 2021-12-08 | Disposition: A | Discharge status: Home / Self Care | Payer: Medicare PPO | Source: Ambulatory Visit | Attending: Gastroenterology | Admitting: Gastroenterology

## 2021-12-08 DIAGNOSIS — R7989 Other specified abnormal findings of blood chemistry: Secondary | ICD-10-CM

## 2021-12-08 DIAGNOSIS — R932 Abnormal findings on diagnostic imaging of liver and biliary tract: Secondary | ICD-10-CM

## 2021-12-08 MED ORDER — GADOBENATE DIMEGLUMINE 529 MG/ML IV SOLN
17.0000 mL | Freq: Once | INTRAVENOUS | Status: AC | PRN
  Administered 2021-12-08: 17 mL via INTRAVENOUS

## 2021-12-13 ENCOUNTER — Telehealth: Payer: Self-pay | Admitting: Gastroenterology

## 2021-12-13 NOTE — Telephone Encounter (Signed)
See results notes for details.  

## 2021-12-13 NOTE — Telephone Encounter (Signed)
Patient calling for results of MRI.  Realized his home phone number hasn't been working and is asking that you call him on his cell phone with the results.  Thank you.

## 2022-01-09 ENCOUNTER — Ambulatory Visit: Payer: Medicare PPO | Admitting: Gastroenterology

## 2022-01-09 ENCOUNTER — Encounter: Payer: Self-pay | Admitting: Gastroenterology

## 2022-01-09 VITALS — BP 108/52 | HR 100 | Ht 65.0 in | Wt 181.2 lb

## 2022-01-09 DIAGNOSIS — R932 Abnormal findings on diagnostic imaging of liver and biliary tract: Secondary | ICD-10-CM | POA: Diagnosis not present

## 2022-01-09 DIAGNOSIS — R7989 Other specified abnormal findings of blood chemistry: Secondary | ICD-10-CM

## 2022-01-09 NOTE — Progress Notes (Signed)
° ° °  History of Present Illness: This is a 76 year old male returning for follow-up of an abnormal CT of the liver and elevated LFTs.  CT and MR as below.  Essentially there is a too small to characterize inferior right hepatic lobe lesion.  All standard hepatic serologies were either negative or normal.  We reviewed the CT and MR findings in detail.    MRI Abd 12/08/2021 Subtle sub-cm lesion in the inferior right hepatic lobe corresponds with lesion seen on recent CT, but is too small to characterize. Recommend continued follow-up by MRI in 6 months. No other mass or lymphadenopathy within the abdomen   CT AP 11/09/2021 1. There is a small low attenuation structure within inferior right hepatic lobe measuring 9 mm. This was not confidently seen on prior imaging and although the appearance is nonspecific metastatic disease in this patient who has a history of malignancy cannot be excluded. Consider more definitive characterization with liver protocol MRI with contrast. 2. Status post right nephrectomy. No suspicious abnormality identified to suggest local tumor recurrence. 3. Progressive bilateral pelvic and inguinal adenopathy, findings are favored to reflect a manifestation of patient's known CLL 4. Aortic Atherosclerosis   Current Medications, Allergies, Past Medical History, Past Surgical History, Family History and Social History were reviewed in [employer] record.   Physical Exam: General: Well developed, well nourished, no acute distress Head: Normocephalic and atraumatic Eyes: Sclerae anicteric, EOMI Ears: Normal auditory acuity Extremities: No clubbing, cyanosis, edema or deformities noted Neurological: Alert oriented x 4, grossly nonfocal Psychological:  Alert and cooperative. Normal mood and affect   Assessment and Recommendations:  Elevated AST and alkaline phosphatase.  Hepatic serologies were all negative or normal.  We reviewed the results of his  hepatic serologies and I addressed his questions to his satisfaction.  Repeat LFTs in 6 months.   MRI, CT show an 8 mm right hepatic lobe lesion, too small to characterize.  We reviewed the results of the 2 imaging studies in detail and I addressed his questions to his satisfaction.  Repeat MRI in 6 months.  S/P Segment 5 hepatic resection in 2012 for focal nodular hyperplasia.  IBS, alternating pattern.  Avoid foods and beverages that exacerbate symptoms.  Continue dicyclomine 20 mg p.o. 3 times daily as needed GERD.  Follow antireflux measures.  Continue omeprazole 20 mg p.o. twice daily. S/P LUL lung resection for Stage I adenocarcinoma in 2020.  S/P nephrectomy for renal cancer in 2008 S/P cholecystectomy.  CLL diagnosed in 2021 CRC screening, average risk. Screening colonoscopy is due in Jan 2024.

## 2022-01-09 NOTE — Patient Instructions (Signed)
We will contact you when it's time to schedule your next MRI.   You will be due for a recall colonoscopy in 12/2022. We will send you a reminder in the mail when it gets closer to that time.  The [facility] providers would like to encourage you to use [facility] to communicate with providers for non-urgent requests or questions.  Due to long hold times on the telephone, sending your provider a message by [facility] may be a faster and more efficient way to get a response.  Please allow 48 business hours for a response.  Please remember that this is for non-urgent requests.   Thank you for choosing me and [facility].  Pricilla Riffle. Dagoberto Ligas., MD., [place] Regal

## 2022-02-01 ENCOUNTER — Telehealth: Payer: Self-pay

## 2022-02-01 NOTE — Telephone Encounter (Signed)
Patient called and left vm asking if he needs to be fasting for his lab work and CT scan 02/03/2022. I called him back and informed him that he did need to be fasting 4 hours prior to the exam and labs. Patient verbalized understanding and had no further questions or concerns.

## 2022-02-03 ENCOUNTER — Other Ambulatory Visit: Payer: Self-pay

## 2022-02-03 ENCOUNTER — Inpatient Hospital Stay: Payer: Medicare PPO | Attending: Internal Medicine

## 2022-02-03 ENCOUNTER — Ambulatory Visit (HOSPITAL_COMMUNITY)
Admission: RE | Admit: 2022-02-03 | Discharge: 2022-02-03 | Disposition: A | Discharge status: Home / Self Care | Payer: Medicare PPO | Source: Ambulatory Visit | Attending: Internal Medicine | Admitting: Internal Medicine

## 2022-02-03 DIAGNOSIS — Z905 Acquired absence of kidney: Secondary | ICD-10-CM | POA: Insufficient documentation

## 2022-02-03 DIAGNOSIS — Z885 Allergy status to narcotic agent status: Secondary | ICD-10-CM | POA: Insufficient documentation

## 2022-02-03 DIAGNOSIS — I700 Atherosclerosis of aorta: Secondary | ICD-10-CM | POA: Insufficient documentation

## 2022-02-03 DIAGNOSIS — Z8719 Personal history of other diseases of the digestive system: Secondary | ICD-10-CM | POA: Insufficient documentation

## 2022-02-03 DIAGNOSIS — C3490 Malignant neoplasm of unspecified part of unspecified bronchus or lung: Secondary | ICD-10-CM

## 2022-02-03 DIAGNOSIS — C3412 Malignant neoplasm of upper lobe, left bronchus or lung: Secondary | ICD-10-CM | POA: Insufficient documentation

## 2022-02-03 DIAGNOSIS — Z9049 Acquired absence of other specified parts of digestive tract: Secondary | ICD-10-CM | POA: Insufficient documentation

## 2022-02-03 DIAGNOSIS — C9110 Chronic lymphocytic leukemia of B-cell type not having achieved remission: Secondary | ICD-10-CM | POA: Insufficient documentation

## 2022-02-03 DIAGNOSIS — R0602 Shortness of breath: Secondary | ICD-10-CM | POA: Insufficient documentation

## 2022-02-03 DIAGNOSIS — Z79899 Other long term (current) drug therapy: Secondary | ICD-10-CM | POA: Insufficient documentation

## 2022-02-03 DIAGNOSIS — Z882 Allergy status to sulfonamides status: Secondary | ICD-10-CM | POA: Insufficient documentation

## 2022-02-03 DIAGNOSIS — Z85528 Personal history of other malignant neoplasm of kidney: Secondary | ICD-10-CM | POA: Insufficient documentation

## 2022-02-03 LAB — CMP (CANCER CENTER ONLY)
ALT: 20 U/L (ref 0–44)
AST: 23 U/L (ref 15–41)
Albumin: 4.2 g/dL (ref 3.5–5.0)
Alkaline Phosphatase: 165 U/L — ABNORMAL HIGH (ref 38–126)
Anion gap: 8 (ref 5–15)
BUN: 37 mg/dL — ABNORMAL HIGH (ref 8–23)
CO2: 26 mmol/L (ref 22–32)
Calcium: 9.9 mg/dL (ref 8.9–10.3)
Chloride: 104 mmol/L (ref 98–111)
Creatinine: 2.09 mg/dL — ABNORMAL HIGH (ref 0.61–1.24)
GFR, Estimated: 33 mL/min — ABNORMAL LOW (ref >60)
Glucose, Bld: 119 mg/dL — ABNORMAL HIGH (ref 70–99)
Potassium: 4.1 mmol/L (ref 3.5–5.1)
Sodium: 138 mmol/L (ref 135–145)
Total Bilirubin: 0.4 mg/dL (ref 0.3–1.2)
Total Protein: 6.9 g/dL (ref 6.5–8.1)

## 2022-02-03 LAB — CBC WITH DIFFERENTIAL (CANCER CENTER ONLY)
Abs Immature Granulocytes: 0.02 10*3/uL (ref 0.00–0.07)
Basophils Absolute: 0 10*3/uL (ref 0.0–0.1)
Basophils Relative: 0 %
Eosinophils Absolute: 0.3 10*3/uL (ref 0.0–0.5)
Eosinophils Relative: 3 %
HCT: 36.9 % — ABNORMAL LOW (ref 39.0–52.0)
Hemoglobin: 12.1 g/dL — ABNORMAL LOW (ref 13.0–17.0)
Immature Granulocytes: 0 %
Lymphocytes Relative: 60 %
Lymphs Abs: 6.5 10*3/uL — ABNORMAL HIGH (ref 0.7–4.0)
MCH: 27.9 pg (ref 26.0–34.0)
MCHC: 32.8 g/dL (ref 30.0–36.0)
MCV: 85 fL (ref 80.0–100.0)
Monocytes Absolute: 0.4 10*3/uL (ref 0.1–1.0)
Monocytes Relative: 3 %
Neutro Abs: 3.8 10*3/uL (ref 1.7–7.7)
Neutrophils Relative %: 34 %
Platelet Count: 197 10*3/uL (ref 150–400)
RBC: 4.34 MIL/uL (ref 4.22–5.81)
RDW: 13.6 % (ref 11.5–15.5)
Smear Review: NORMAL
WBC Count: 11 10*3/uL — ABNORMAL HIGH (ref 4.0–10.5)
nRBC: 0 % (ref 0.0–0.2)

## 2022-02-06 ENCOUNTER — Other Ambulatory Visit: Payer: Self-pay

## 2022-02-06 ENCOUNTER — Inpatient Hospital Stay: Payer: Medicare PPO | Admitting: Internal Medicine

## 2022-02-06 VITALS — BP 121/60 | HR 100 | Temp 97.7°F | Resp 19 | Ht 65.0 in | Wt 183.4 lb

## 2022-02-06 DIAGNOSIS — Z8719 Personal history of other diseases of the digestive system: Secondary | ICD-10-CM | POA: Diagnosis not present

## 2022-02-06 DIAGNOSIS — I700 Atherosclerosis of aorta: Secondary | ICD-10-CM | POA: Diagnosis not present

## 2022-02-06 DIAGNOSIS — Z9049 Acquired absence of other specified parts of digestive tract: Secondary | ICD-10-CM | POA: Diagnosis not present

## 2022-02-06 DIAGNOSIS — C9110 Chronic lymphocytic leukemia of B-cell type not having achieved remission: Secondary | ICD-10-CM | POA: Diagnosis present

## 2022-02-06 DIAGNOSIS — C3412 Malignant neoplasm of upper lobe, left bronchus or lung: Secondary | ICD-10-CM | POA: Diagnosis not present

## 2022-02-06 DIAGNOSIS — C3490 Malignant neoplasm of unspecified part of unspecified bronchus or lung: Secondary | ICD-10-CM | POA: Diagnosis not present

## 2022-02-06 DIAGNOSIS — Z885 Allergy status to narcotic agent status: Secondary | ICD-10-CM | POA: Diagnosis not present

## 2022-02-06 DIAGNOSIS — Z882 Allergy status to sulfonamides status: Secondary | ICD-10-CM | POA: Diagnosis not present

## 2022-02-06 DIAGNOSIS — R0602 Shortness of breath: Secondary | ICD-10-CM | POA: Diagnosis not present

## 2022-02-06 DIAGNOSIS — Z79899 Other long term (current) drug therapy: Secondary | ICD-10-CM | POA: Diagnosis not present

## 2022-02-06 DIAGNOSIS — Z905 Acquired absence of kidney: Secondary | ICD-10-CM | POA: Diagnosis not present

## 2022-02-06 DIAGNOSIS — Z85528 Personal history of other malignant neoplasm of kidney: Secondary | ICD-10-CM | POA: Diagnosis not present

## 2022-02-06 NOTE — Progress Notes (Signed)
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  PRIOR THERAPY: Status post left upper lobe wedge resection with lymph node sampling.  CURRENT THERAPY: Observation.  INTERVAL HISTORY: BASSAM DRESCH 76 y.o. male returns to the clinic today for follow-up visit.  The patient is feeling fine today with no concerning complaints except for mild shortness of breath with exertion but no significant chest pain, cough or hemoptysis.  He denied having any recent weight loss or night sweats.  He has no nausea, vomiting, diarrhea or constipation.  He has no headache or visual changes.  He had repeat CT scan of the chest performed recently and the patient is here today for evaluation and discussion of his scan results.  MEDICAL HISTORY: Past Medical History:  Diagnosis Date   Anxiety    Atypical mole 06/08/2010   Right Abdomen(moderate) Mindi Slicker)   Atypical mole 04/25/1995   Left Upper ShoulderBlade(slight)   Atypical mole 12/31/2013   Left Mid Back(Moderate) Mindi Slicker)   Colon polyps    Coronary artery disease    Eczema    GERD (gastroesophageal reflux disease)    Gout    Hearing loss    Hx of cardiovascular stress test    Lexiscan Myoview 5/16:  The study is normal. No ischemia identified. This is a low risk study. Overall left ventricular systolic function was normal. LV cavity size is normal. The left ventricular ejection fraction is normal (55-65%). Sensitivity reduced by bowel loop attenuation artifact.   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  MEDICATIONS:  Current Outpatient Medications  Medication Sig Dispense Refill   allopurinol (ZYLOPRIM) 100 MG tablet Take 100 mg by mouth daily.     ALPRAZolam (XANAX) 0.5 MG tablet Take 0.5 mg by mouth 2 (two) times daily as needed for anxiety.      amLODipine (NORVASC) 10 MG tablet Take 10 mg by mouth daily.      aspirin 81 MG tablet Take 81 mg by mouth daily.       augmented betamethasone dipropionate (DIPROLENE-AF) 0.05 % cream Apply topically 2 (two) times daily. 30 g 1   clidinium-chlordiazePOXIDE (LIBRAX) 5-2.5 MG capsule Take 2 capsules by mouth daily as needed. Takes for  IBS     clopidogrel (PLAVIX) 75 MG tablet Take 1 tablet (75 mg total) by mouth daily. Please keep upcoming appt in January with Dr. Angelena Form for future refills. Thank you 90 tablet 0   Colchicine 0.6 MG CAPS Take 1 tablet by mouth as needed.     dicyclomine (BENTYL) 20 MG tablet      diphenhydrAMINE (BENADRYL) 25 MG tablet  Take 25 mg by mouth at bedtime.     ferrous sulfate 325 (65 FE) MG tablet Take 1 tablet by mouth daily.     hydrochlorothiazide (HYDRODIURIL) 25 MG tablet Take 25 mg by mouth daily.     hydrOXYzine (ATARAX) 25 MG tablet Take 25 mg by mouth as needed.     ibuprofen (ADVIL) 200 MG tablet Take 200-400 mg by mouth every 6 (six) hours as needed.     levothyroxine (SYNTHROID) 150 MCG tablet Take 150 mcg by mouth daily before breakfast.     nitroGLYCERIN (NITRODUR - DOSED IN MG/24 HR) 0.4 mg/hr patch Place 0.4 mg onto the skin daily as needed.     olmesartan (BENICAR) 5 MG tablet Take 2 tablets by mouth daily.     omeprazole (PRILOSEC) 20 MG capsule Take 20 mg by mouth 2 (two) times daily.      rosuvastatin (CRESTOR)  10 MG tablet Take 10 mg by mouth daily.     simethicone (MYLICON) 500 MG chewable tablet Chew 125 mg by mouth every 6 (six) hours as needed for flatulence.     valACYclovir (VALTREX) 500 MG tablet Take 500 mg by mouth 2 (two) times daily as needed (fever blisters).  0   No current facility-administered medications for this visit.    SURGICAL HISTORY:  Past Surgical History:  Procedure Laterality Date   CARDIAC CATHETERIZATION     CHOLECYSTECTOMY  10/26/2011   COLONOSCOPY     CORONARY STENT PLACEMENT  2006   FINGER SURGERY Right 76 y/o   cut index rt   INTERCOSTAL NERVE BLOCK  06/16/2019   Procedure: Intercostal Nerve Block;  Surgeon: Grace Isaac, MD;  Location: [facility];  Service: Thoracic;;   LIPOMA EXCISION Left 11/18/2013   Procedure: EXCISION POSTERIOR NECK LIPOMA ;  Surgeon: Stark Klein, MD;  Location: [facility];  Service: General;  Laterality: Left;   LIVER BIOPSY  09/18/2011   LIVER LOBECTOMY  10/26/2011   LYMPH NODE DISSECTION  06/16/2019   Procedure: Lymph Node Dissection;  Surgeon: Grace Isaac, MD;  Location: [facility];  Service: Thoracic;;   NEPHRECTOMY Right 2008   right   TONSILLECTOMY     VIDEO ASSISTED THORACOSCOPY (VATS)/WEDGE RESECTION Left 06/16/2019   Procedure: VIDEO ASSISTED THORACOSCOPY (VATS)/MINI THOROCOTOMY WITH LEFT UPPER LOBE WEDGE RESECTION;  Surgeon: Grace Isaac, MD;  Location: [facility];  Service: Thoracic;  Laterality: Left;   VIDEO BRONCHOSCOPY N/A 06/16/2019   Procedure: VIDEO BRONCHOSCOPY;  Surgeon: Grace Isaac, MD;  Location: [facility];  Service: Thoracic;  Laterality: N/A;    REVIEW OF SYSTEMS:  Constitutional: positive for fatigue Eyes: negative Ears, nose, mouth, throat, and face: negative Respiratory: positive for dyspnea on exertion Cardiovascular: negative Gastrointestinal: negative Genitourinary:negative Integument/breast: negative Hematologic/lymphatic: negative Musculoskeletal:negative Neurological:  negative Behavioral/Psych: negative Endocrine: negative Allergic/Immunologic: negative   PHYSICAL EXAMINATION: General appearance: alert, cooperative, and no distress Head: Normocephalic, without obvious abnormality, atraumatic Neck: no adenopathy, no JVD, supple, symmetrical, trachea midline, and thyroid not enlarged, symmetric, no tenderness/mass/nodules Lymph nodes: Cervical, supraclavicular, and axillary nodes normal. Resp: clear to auscultation bilaterally Back: symmetric, no curvature. ROM normal. No CVA tenderness. Cardio: regular rate and rhythm, S1, S2 normal, no murmur, click, rub or gallop GI: soft, non-tender; bowel sounds normal; no masses,  no organomegaly Extremities: extremities normal, atraumatic, no cyanosis or edema Neurologic: Alert and oriented X 3, normal strength and tone. Normal symmetric reflexes. Normal coordination and gait  ECOG PERFORMANCE STATUS: 1 - Symptomatic  but completely ambulatory  Blood pressure 121/60, pulse 100, temperature 97.7 F (36.5 C), temperature source Tympanic, resp. rate 19, height '5\' 5"'  (1.651 m), weight 183 lb 6.4 oz (83.2 kg), SpO2 98 %.  LABORATORY DATA: Lab Results  Component Value Date   WBC 11.0 (H) 02/03/2022   HGB 12.1 (L) 02/03/2022   HCT 36.9 (L) 02/03/2022   MCV 85.0 02/03/2022   PLT 197 02/03/2022      Chemistry      Component Value Date/Time   NA 138 02/03/2022 1225   K 4.1 02/03/2022 1225   CL 104 02/03/2022 1225   CO2 26 02/03/2022 1225   BUN 37 (H) 02/03/2022 1225   CREATININE 2.09 (H) 02/03/2022 1225   CREATININE 1.30 (H) 02/06/2016 1714      Component Value Date/Time   CALCIUM 9.9 02/03/2022 1225   ALKPHOS 165 (H) 02/03/2022 1225   AST 23 02/03/2022 1225   ALT 20 02/03/2022 1225   BILITOT 0.4 02/03/2022 1225       RADIOGRAPHIC STUDIES: CT Chest Wo Contrast  Result Date: 02/05/2022 CLINICAL DATA:  76 year old male with history of non-small cell lung cancer. Additional history of small  lymphocytic lymphoma. Follow-up study. EXAM: CT CHEST WITHOUT CONTRAST TECHNIQUE: Multidetector CT imaging of the chest was performed following the standard protocol without IV contrast. RADIATION DOSE REDUCTION: This exam was performed according to the departmental dose-optimization program which includes automated exposure control, adjustment of the mA and/or kV according to patient size and/or use of iterative reconstruction technique. COMPARISON:  Multiple priors, most recently chest CT 08/02/2021. FINDINGS: Cardiovascular: Heart size is normal. There is no significant pericardial fluid, thickening or pericardial calcification. There is aortic atherosclerosis, as well as atherosclerosis of the great vessels of the mediastinum and the coronary arteries, including calcified atherosclerotic plaque in the left main, left anterior descending, left circumflex and right coronary arteries. Mediastinum/Nodes: No pathologically enlarged mediastinal or hilar lymph nodes. Borderline enlarged middle mediastinal lymph node measuring up to 1.2 cm in short axis (axial image 87 of series 3), decreased slightly compared to the prior study. Esophagus is unremarkable in appearance. Extensive bilateral axillary and subpectoral lymphadenopathy, worsened compared to the prior examination, with the largest lymph nodes measuring up to 2.5 cm on the right (axial image 32 of series 3) and 2.8 cm on the left (axial image 58 of series 3). Lungs/Pleura: Postoperative changes of wedge resection are again noted in the posterior aspect of the left upper lobe. No unexpected nodularity along the suture line to suggest locally recurrent disease. 1.7 x 1.1 cm ground-glass attenuation nodule in the right upper lobe (axial image 39 of series 8), previously 1.6 x 0.9 cm on 08/02/2021. No other definite suspicious appearing pulmonary nodules or masses are noted. Widespread areas of ground-glass attenuation, septal thickening, thickening of the  peribronchovascular interstitium and chronic volume loss and regional architectural distortion in the lower lobes of the lungs bilaterally. No confluent consolidative airspace disease. No pleural effusions. Upper Abdomen: Well-defined low-attenuation lesion in the medial aspect of the spleen, similar to prior studies measuring 3.8 cm in diameter, incompletely characterized on today's non-contrast CT examination, but potentially a splenic cyst. Status post cholecystectomy. Aortic atherosclerosis. Musculoskeletal: There are no aggressive appearing lytic or blastic lesions noted in the visualized portions of the skeleton. IMPRESSION: 1. No definitive findings to suggest locally recurrent disease or definite metastatic disease in the thorax. 2. Increasingly conspicuous ground-glass attenuation nodule in the right upper lobe. This may represent a slow growing  primary bronchogenic neoplasm such as an adenocarcinoma. No central solid component noted at this time. Continued close attention on follow-up studies is recommended. 3. Worsening lymphadenopathy in bilateral axillary and subpectoral nodal stations, concerning for progressive lymphoma. 4. The appearance of the lung bases is concerning for progressive interstitial lung disease. At this time, findings are characterized as indeterminate for usual interstitial pneumonia (UIP) per current ATS guidelines. Outpatient referral to Pulmonology for further clinical evaluation is recommended. 5. Aortic atherosclerosis, in addition to left main and 3 vessel coronary artery disease. Assessment for potential risk factor modification, dietary therapy or pharmacologic therapy may be warranted, if clinically indicated. 6. Additional incidental findings, as above. Aortic Atherosclerosis (ICD10-I70.0). Electronically Signed   By: Vinnie Langton M.D.   On: 02/05/2022 13:34     ASSESSMENT AND PLAN: This is a very pleasant 76 years old white male with stage Ia non-small cell lung  cancer, adenocarcinoma with positive MET 14 splice mutation status post wedge resection of the left upper lobe with lymph node sampling in June 2020.  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 The patient is currently on observation with no concerning complaints except for mild shortness of breath with exertion. He had repeat CT scan of the chest performed recently.  I personally and independently reviewed the scan images and discussed the result and showed the images to the patient and his wife. His scan showed slight increase and groundglass opacity in the right lung in addition to mildly enlarged lymphadenopathy consistent with his history of small lymphocytic lymphoma. I had a lengthy discussion with the patient about his condition and treatment options.  I recommended for the patient to continue on observation with close monitoring of these findings. I will repeat CT scan of the chest, abdomen and pelvis in 6 months and if he has any further progression or B symptoms from the small lymphocytic lymphoma, I would consider The patient for treatment at that time. The patient and his wife are in agreement with the current plan. He was advised to call immediately if he has any other concerning symptoms in the interval.   He was advised to call immediately if he has any other concerning symptoms in the interval. The patient voices understanding of current disease status and treatment options and is in agreement with the current care plan.  All questions were answered. The patient knows to call the clinic with any problems, questions or concerns. We can certainly see the patient much sooner if necessary.  Disclaimer: This note was dictated with voice recognition software. Similar sounding words can inadvertently be transcribed and may not be corrected upon review.

## 2022-02-08 ENCOUNTER — Encounter: Payer: Self-pay | Admitting: Cardiovascular Disease

## 2022-02-08 ENCOUNTER — Ambulatory Visit: Payer: Medicare PPO | Admitting: Cardiovascular Disease

## 2022-02-08 ENCOUNTER — Other Ambulatory Visit: Payer: Self-pay

## 2022-02-08 VITALS — BP 118/62 | HR 88 | Ht 65.0 in | Wt 177.8 lb

## 2022-02-08 DIAGNOSIS — E7800 Pure hypercholesterolemia, unspecified: Secondary | ICD-10-CM | POA: Diagnosis not present

## 2022-02-08 DIAGNOSIS — I2510 Atherosclerotic heart disease of native coronary artery without angina pectoris: Secondary | ICD-10-CM | POA: Diagnosis not present

## 2022-02-08 DIAGNOSIS — I10 Essential (primary) hypertension: Secondary | ICD-10-CM

## 2022-02-08 MED ORDER — CLOPIDOGREL BISULFATE 75 MG PO TABS: 75.0000 mg | ORAL_TABLET | Freq: Every day | ORAL | 3 refills | Status: DC

## 2022-02-08 NOTE — Progress Notes (Signed)
Chief Complaint  Patient presents with   Follow-up    CAD   History of Present Illness: 76 yo male with history of CAD, HTN, HLD, renal cell carcinoma s/p right nephrectomy, lung cancer s/p resection, lymphocytic lymphoma/CLL here today for cardiac follow up. Cardiac cath February 2006 with a 2.75 x 28 mm Costar study stent (DES) placed in the mid LAD. Last cath November 2006 with patent mid LAD stent, 40% proximal LAD stenosis, 80% small diagonal branch stenosis, minimal disease in the Circumflex and RCA. Echo 03/18/13 with normal LV function, mild LVH. Nuclear stress test May 2016 with no evidence of ischemia. Lung mass found in early 2020 and biopsy was c/w adenocarcinoma. He is now s/p left upper lobe lung resection. Nuclear stress test April 2020 without ischemia. He has been diagnosed with lymphocytic lymphoma/CLL and is being followed by Dr. Julien Nordmann. Echo February 2022 with LVEF=65-70%, mild LVH, no valve disease.   He is here today for follow up. The patient denies any chest pain, dyspnea, palpitations, lower extremity edema, orthopnea, PND, dizziness, near syncope or syncope. He only has dyspnea with extreme exertion.   Primary Care Physician: [name], MD  Past Medical History:  Diagnosis Date   Anxiety    Atypical mole 06/08/2010   Right Abdomen(moderate) Mindi Slicker)   Atypical mole 04/25/1995   Left Upper ShoulderBlade(slight)   Atypical mole 12/31/2013   Left Mid Back(Moderate) Mindi Slicker)   Colon polyps    Coronary artery disease    Eczema    GERD (gastroesophageal reflux disease)    Gout    Hearing loss    Hx of cardiovascular stress test    Lexiscan Myoview 5/16:  The study is normal. No ischemia identified. This is a low risk study. Overall left ventricular systolic function was normal. LV cavity size is normal. The left ventricular ejection fraction is normal (55-65%). Sensitivity reduced by bowel loop attenuation artifact.   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   Left Scalp   Pneumonia    Psoriasis    PVD (peripheral vascular disease) ([facility])    Rash    Renal cell cancer ([place])    Squamous cell carcinoma in situ    Wears glasses    Wears hearing aid    both ears    Past Surgical History:  Procedure Laterality Date   CARDIAC CATHETERIZATION     CHOLECYSTECTOMY  10/26/2011   COLONOSCOPY     CORONARY STENT PLACEMENT  2006   FINGER SURGERY Right 76 y/o   cut index rt   INTERCOSTAL NERVE BLOCK  06/16/2019   Procedure: Intercostal Nerve Block;  Surgeon: Grace Isaac, MD;  Location: [facility];  Service: Thoracic;;   LIPOMA EXCISION Left 11/18/2013   Procedure: EXCISION POSTERIOR NECK LIPOMA ;  Surgeon: Stark Klein, MD;  Location: [facility];  Service: General;  Laterality: Left;   LIVER BIOPSY  09/18/2011   LIVER LOBECTOMY  10/26/2011   LYMPH NODE DISSECTION  06/16/2019   Procedure: Lymph Node Dissection;  Surgeon: Grace Isaac, MD;  Location: [facility];  Service: Thoracic;;   NEPHRECTOMY Right 2008   right   TONSILLECTOMY     VIDEO ASSISTED THORACOSCOPY (VATS)/WEDGE RESECTION Left 06/16/2019   Procedure: VIDEO ASSISTED THORACOSCOPY (VATS)/MINI THOROCOTOMY WITH LEFT UPPER LOBE WEDGE RESECTION;  Surgeon:  Grace Isaac, MD;  Location: [facility];  Service: Thoracic;  Laterality: Left;   VIDEO BRONCHOSCOPY N/A 06/16/2019   Procedure: VIDEO BRONCHOSCOPY;  Surgeon: Grace Isaac, MD;  Location[facility];  Service: Thoracic;  Laterality: N/A;    Current Outpatient Medications  Medication Sig Dispense Refill   allopurinol (ZYLOPRIM) 100 MG tablet Take 100 mg by mouth daily.     ALPRAZolam (XANAX) 0.5 MG tablet Take 0.5 mg by mouth 2 (two) times daily as needed for anxiety.      amLODipine (NORVASC) 10 MG tablet Take 10 mg by mouth daily.      aspirin 81 MG tablet Take 81 mg by mouth  daily.       augmented betamethasone dipropionate (DIPROLENE-AF) 0.05 % cream Apply topically 2 (two) times daily. 30 g 1   clidinium-chlordiazePOXIDE (LIBRAX) 5-2.5 MG capsule Take 2 capsules by mouth daily as needed. Takes for  IBS     Colchicine 0.6 MG CAPS Take 1 tablet by mouth as needed.     dicyclomine (BENTYL) 20 MG tablet      diphenhydrAMINE (BENADRYL) 25 MG tablet Take 25 mg by mouth at bedtime.     ferrous sulfate 325 (65 FE) MG tablet Take 1 tablet by mouth daily.     hydrochlorothiazide (HYDRODIURIL) 25 MG tablet Take 25 mg by mouth daily.     hydrOXYzine (ATARAX) 25 MG tablet Take 25 mg by mouth as needed.     ibuprofen (ADVIL) 200 MG tablet Take 200-400 mg by mouth every 6 (six) hours as needed.     levothyroxine (SYNTHROID) 150 MCG tablet Take 150 mcg by mouth daily before breakfast.     nitroGLYCERIN (NITROSTAT) 0.4 MG SL tablet Place 0.4 mg under the tongue every 5 (five) minutes as needed for chest pain.     olmesartan (BENICAR) 5 MG tablet Take 2 tablets by mouth daily.     omeprazole (PRILOSEC) 20 MG capsule Take 20 mg by mouth 2 (two) times daily.      rosuvastatin (CRESTOR) 10 MG tablet Take 10 mg by mouth daily.     simethicone (MYLICON) 102 MG chewable tablet Chew 125 mg by mouth every 6 (six) hours as needed for flatulence.     valACYclovir (VALTREX) 500 MG tablet Take 500 mg by mouth 2 (two) times daily as needed (fever blisters).  0   clopidogrel (PLAVIX) 75 MG tablet Take 1 tablet (75 mg total) by mouth daily. 90 tablet 3   No current facility-administered medications for this visit.    Allergies  Allergen Reactions   Sulfa Antibiotics Other (See Comments)    Happened when he was child. Does not remember reaction.   Demerol Other (See Comments)    Severe headache.   Pseudoephedrine Other (See Comments)    Hypertension     Social History   Socioeconomic History   Marital status: Married    Spouse name: Not on file   Number of children: 0   Years of  education: Not on file   Highest education level: Not on file  Occupational History   Occupation: Retired from state of [employer]  Tobacco Use   Smoking status: Former    Packs/day: 1.00    Years: 15.00    Pack years: 15.00    Types: Cigarettes    Quit date: 10/26/1983    Years since quitting: 38.3   Smokeless tobacco: Never  Vaping Use   Vaping Use: Never used  Substance and Sexual Activity  Alcohol use: Not Currently    Alcohol/week: 14.0 - 20.0 standard drinks    Types: 14 - 20 Glasses of wine per week    Comment: 2-3 per day - wine, beer, liquor   Drug use: No   Sexual activity: Not on file  Other Topics Concern   Not on file  Social History Narrative   Not on file   Social Determinants of Health   Financial Resource Strain: Not on file  Food Insecurity: Not on file  Transportation Needs: Not on file  Physical Activity: Not on file  Stress: Not on file  Social Connections: Not on file  Intimate Partner Violence: Not on file    Family History  Problem Relation Age of Onset   Hypertension Mother    Heart failure Mother    Heart disease Mother    Aneurysm Mother        brain   Peripheral vascular disease Father    Lung cancer Father    Hypertension Sister    Breast cancer Sister    Heart attack Maternal Grandfather    Heart attack Maternal Uncle    Colon cancer Neg Hx     Review of Systems:  As stated in the HPI and otherwise negative.   BP 118/62    Pulse 88    Ht 5\' 5"  (1.651 m)    Wt 177 lb 12.8 oz (80.6 kg)    SpO2 98%    BMI 29.59 kg/m   Physical Examination:  General: Well developed, well nourished, NAD  HEENT: OP clear, mucus membranes moist  SKIN: warm, dry. No rashes. Neuro: No focal deficits  Musculoskeletal: Muscle strength 5/5 all ext  Psychiatric: Mood and affect normal  Neck: No JVD, no carotid bruits, no thyromegaly, no lymphadenopathy.  Lungs:Clear bilaterally, no wheezes, rhonci, crackles Cardiovascular: Regular rate and rhythm. No  murmurs, gallops or rubs. Abdomen:Soft. Bowel sounds present. Non-tender.  Extremities: No lower extremity edema. Pulses are 2 + in the bilateral DP/PT.  Cardiac cath 11/02/05: 1. The left main coronary is free of critical disease.  2. The left anterior descending artery demonstrates about an area of about  30-40% narrowing segmentally in the proximal LAD overlapping the origin  of the diagonal. The diagonal is moderate in distribution, but is fairly  small in caliber. It has an 80% ostial stenosis. Compared to the  previous study there is perhaps very mild progression of disease in the  proximal LAD. The mid vessel, where the stent was previously placed  remains widely patent with excellent runoff into the distal vessel. The  distal LAD is free of critical disease.  3. The circumflex is large-caliber vessel with mild proximal irregularity  overlapping the origin of the first marginal branch. Importantly, after  the administration of intracoronary nitroglycerin this area looked more  like 30-40% due to vasodilatation of the distal vessel. However, none  of this appeared to be flow-limiting and was fairly large in caliber.  4. The right coronary artery is a large-caliber vessel. There is perhaps  mild irregularity in the mid vessel but no areas of high-grade disease.  5. Ventriculography in the RAO projection reveals vigorous global systolic  function without segmental wall motion abnormality.  Echo February 2022:   1. Left ventricular ejection fraction, by estimation, is 65 to 70%. The  left ventricle has normal function. The left ventricle has no regional  wall motion abnormalities. There is mild left ventricular hypertrophy.  Left ventricular diastolic parameters  are  consistent with Grade I diastolic dysfunction (impaired relaxation).   2. Right ventricular systolic function is normal. The right ventricular  size is normal. Tricuspid regurgitation signal is inadequate for assessing   PA pressure.   3. The mitral valve is grossly normal. No evidence of mitral valve  regurgitation.   4. The aortic valve is tricuspid. Aortic valve regurgitation is trivial.  No aortic stenosis is present.   5. The inferior vena cava is normal in size with greater than 50%  respiratory variability, suggesting right atrial pressure of 3 mmHg.   EKG:  EKG is ordered today.  The EKG is reviewed by me and shows sinus,   Recent Labs: 02/03/2022: ALT 20; BUN 37; Creatinine 2.09; Hemoglobin 12.1; Platelet Count 197; Potassium 4.1; Sodium 138   Lipid Panel Followed in primary care   Wt Readings from Last 3 Encounters:  02/08/22 177 lb 12.8 oz (80.6 kg)  02/06/22 183 lb 6.4 oz (83.2 kg)  01/09/22 181 lb 3.2 oz (82.2 kg)     Other studies Reviewed: Additional studies/ records that were reviewed today include. Review of the above records demonstrates:  Assessment and Plan:   1. CAD without angina: He has no chest pain. Will continue DAPT with ASA and Plavix (given first generation drug eluting stent). Will continue statin. LV function normal by echo February 2022.    2. HTN: BP is controlled. No changes  3. HLD: Lipids followed in primary care. LDL 49 in October 2022. Continue statin  Current medicines are reviewed at length with the patient today.  The patient does not have concerns regarding medicines.  The following changes have been made:  no change  Labs/ tests ordered today include:  Orders Placed This Encounter  Procedures   EKG 12-Lead    Disposition:   F/U with me in 12  months  Signed, [name], MD 02/08/2022 3:46 PM    [facility] [address], [place], [place]  [address] Phone: [phone]; Fax: [phone]

## 2022-02-08 NOTE — Patient Instructions (Signed)

## 2022-03-13 ENCOUNTER — Other Ambulatory Visit: Payer: Self-pay

## 2022-03-13 ENCOUNTER — Encounter: Payer: Self-pay | Admitting: Pulmonary Disease

## 2022-03-13 ENCOUNTER — Ambulatory Visit (INDEPENDENT_AMBULATORY_CARE_PROVIDER_SITE_OTHER): Payer: Medicare PPO | Admitting: Pulmonary Disease

## 2022-03-13 VITALS — BP 138/64 | HR 101 | Temp 97.7°F | Ht 66.0 in | Wt 183.2 lb

## 2022-03-13 DIAGNOSIS — J849 Interstitial pulmonary disease, unspecified: Secondary | ICD-10-CM

## 2022-03-13 NOTE — Patient Instructions (Signed)
I have reviewed his CT scan which shows some nonspecific changes in the lung.  Actually this has remained stable in my opinion for the past 5 years.  Asiyah asymptomatic we will just continue to monitor ?We will check labs today including ANA, rheumatoid factor, CCP ?Follow-up in 6 months after next CT scan ?

## 2022-03-13 NOTE — Progress Notes (Addendum)
? ?      ?Bradley Irwin    [id number]    02-08-1946 ? ?Primary Care Physician:[name], Jesse Sans, MD ? ?Referring Physician: [name], MD ?[address] ?[place],  [place] [address] ? ?Chief complaint: Consult for interstitial lung disease ? ?HPI: ?76 year old with history of CL, lung cancer ?Diagnosed with stage Ia non-small cell lung cancer, adenocarcinoma s/p wedge resection of the left upper lobe with lymph node sampling in June 2020.  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  He is currently on observation under the care of Dr. Julien Nordmann with no active treatment.  Other history significant for kidney cancer status post right nephrectomy in 2008 ? ?Recent CT showed some interstitial abnormalities and has been referred here for further evaluation ? ? ?Pets: No pets.  He had a parakeet tested positive ?Occupation: Used to work in [employer].  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  He has a feather pillow for the past 2 to 3 years ?Exposures: Possible asbestos and feather pillow exposure as above ?Smoking history: Quit smoking in 1984.  13-pack-year smoker ?Travel history: No significant travel ?Relevant family history: No family history of lung disease ? ? ?Outpatient Encounter Medications as of 03/13/2022  ?Medication Sig  ? allopurinol (ZYLOPRIM) 100 MG tablet Take 100 mg by mouth daily.  ? ALPRAZolam (XANAX) 0.5 MG tablet Take 0.5 mg by mouth 2 (two) times daily as needed for anxiety.   ? amLODipine (NORVASC) 10 MG tablet Take 10 mg by mouth daily.   ? aspirin 81 MG tablet Take 81 mg by mouth daily.    ? augmented betamethasone dipropionate (DIPROLENE-AF) 0.05 % cream Apply topically 2 (two) times daily.  ? clidinium-chlordiazePOXIDE (LIBRAX) 5-2.5 MG capsule Take 2 capsules by mouth daily as needed. Takes for  IBS  ? clopidogrel (PLAVIX) 75 MG tablet Take 1 tablet (75 mg total) by mouth daily.  ? Colchicine 0.6 MG CAPS Take 1 tablet by mouth as needed.  ? dicyclomine  (BENTYL) 20 MG tablet   ? diphenhydrAMINE (BENADRYL) 25 MG tablet Take 25 mg by mouth at bedtime.  ? ferrous sulfate 325 (65 FE) MG tablet Take 1 tablet by mouth daily.  ? hydrochlorothiazide (HYDRODIURIL) 25 MG tablet Take 25 mg by mouth daily.  ? hydrOXYzine (ATARAX) 25 MG tablet Take 25 mg by mouth as needed.  ? ibuprofen (ADVIL) 200 MG tablet Take 200-400 mg by mouth every 6 (six) hours as needed.  ? levothyroxine (SYNTHROID) 150 MCG tablet Take 150 mcg by mouth daily before breakfast.  ? nitroGLYCERIN (NITROSTAT) 0.4 MG SL tablet Place 0.4 mg under the tongue every 5 (five) minutes as needed for chest pain.  ? olmesartan (BENICAR) 5 MG tablet Take 2 tablets by mouth daily.  ? omeprazole (PRILOSEC) 20 MG capsule Take 20 mg by mouth 2 (two) times daily.   ? rosuvastatin (CRESTOR) 10 MG tablet Take 10 mg by mouth daily.  ? simethicone (MYLICON) 412 MG chewable tablet Chew 125 mg by mouth every 6 (six) hours as needed for flatulence.  ? valACYclovir (VALTREX) 500 MG tablet Take 500 mg by mouth 2 (two) times daily as needed (fever blisters).  ? ?No facility-administered encounter medications on file as of 03/13/2022.  ? ? ?Allergies as of 03/13/2022 - Review Complete 03/13/2022  ?Allergen Reaction Noted  ? Sulfa antibiotics Other (See Comments) 05/18/2011  ? Demerol Other (See Comments) 09/12/2011  ? Pseudoephedrine Other (See Comments) 10/27/2011  ? ? ?  Past Medical History:  ?Diagnosis Date  ? Anxiety   ? Atypical mole 06/08/2010  ? Right Abdomen(moderate) Mindi Slicker)  ? Atypical mole 04/25/1995  ? Left Upper ShoulderBlade(slight)  ? Atypical mole 12/31/2013  ? Left Mid Back(Moderate) Mindi Slicker)  ? Colon polyps   ? Coronary artery disease   ? Eczema   ? GERD (gastroesophageal reflux disease)   ? Gout   ? Hearing loss   ? Hx of cardiovascular stress test   ? Lexiscan Myoview 5/16:  The study is normal. No ischemia identified. This is a low risk study. Overall left ventricular systolic function was normal. LV  cavity size is normal. The left ventricular ejection fraction is normal (55-65%). Sensitivity reduced by bowel loop attenuation artifact.  ? Hyperlipidemia   ? Hypertension   ? Hypothyroidism   ? IBS (irritable bowel syndrome)   ? Lung cancer  left upper lobe ([facility]) 06/16/2019  ? Melanoma in situ ([facility]) 07/30/2013  ? Left Scalp  ? Pneumonia   ? Psoriasis   ? PVD (peripheral vascular disease) ([place])   ? Rash   ? Renal cell cancer ([place])   ? Squamous cell carcinoma in situ   ? Wears glasses   ? Wears hearing aid   ? both ears  ? ? ?Past Surgical History:  ?Procedure Laterality Date  ? CARDIAC CATHETERIZATION    ? CHOLECYSTECTOMY  10/26/2011  ? COLONOSCOPY    ? CORONARY STENT PLACEMENT  2006  ? FINGER SURGERY Right 76 y/o  ? cut index rt  ? INTERCOSTAL NERVE BLOCK  06/16/2019  ? Procedure: Intercostal Nerve Block;  Surgeon: Grace Isaac, MD;  Location: [facility];  Service: Thoracic;;  ? LIPOMA EXCISION Left 11/18/2013  ? Procedure: EXCISION POSTERIOR NECK LIPOMA ;  Surgeon: Stark Klein, MD;  Location: [facility];  Service: General;  Laterality: Left;  ? LIVER BIOPSY  09/18/2011  ? LIVER LOBECTOMY  10/26/2011  ? LYMPH NODE DISSECTION  06/16/2019  ? Procedure: Lymph Node Dissection;  Surgeon: Grace Isaac, MD;  Location: [facility];  Service: Thoracic;;  ? NEPHRECTOMY Right 2008  ? right  ? TONSILLECTOMY    ? VIDEO ASSISTED THORACOSCOPY (VATS)/WEDGE RESECTION Left 06/16/2019  ? Procedure: VIDEO ASSISTED THORACOSCOPY (VATS)/MINI THOROCOTOMY WITH LEFT UPPER LOBE WEDGE RESECTION;  Surgeon: Grace Isaac, MD;  Location: [facility];  Service: Thoracic;  Laterality: Left;  ? VIDEO BRONCHOSCOPY N/A 06/16/2019  ? Procedure: VIDEO BRONCHOSCOPY;  Surgeon: Grace Isaac, MD;  Location: [facility];  Service: Thoracic;  Laterality: N/A;  ? ? ?Family History  ?Problem Relation Age of Onset  ? Hypertension Mother   ? Heart failure Mother   ? Heart disease Mother   ? Aneurysm Mother   ?     brain  ? Peripheral vascular  disease Father   ? Lung cancer Father   ? Hypertension Sister   ? Breast cancer Sister   ? Heart attack Maternal Grandfather   ? Heart attack Maternal Uncle   ? Colon cancer Neg Hx   ? ? ?Social History  ? ?Socioeconomic History  ? Marital status: Married  ?  Spouse name: Not on file  ? Number of children: 0  ? Years of education: Not on file  ? Highest education level: Not on file  ?Occupational History  ? Occupation: Retired from state of [employer]  ?Tobacco Use  ? Smoking status: Former  ?  Packs/day: 1.00  ?  Years: 15.00  ?  Pack years:  15.00  ?  Types: Cigarettes  ?  Quit date: 10/26/1983  ?  Years since quitting: 38.4  ? Smokeless tobacco: Never  ?Vaping Use  ? Vaping Use: Never used  ?Substance and Sexual Activity  ? Alcohol use: Not Currently  ?  Alcohol/week: 14.0 - 20.0 standard drinks  ?  Types: 14 - 20 Glasses of wine per week  ?  Comment: 2-3 per day - wine, beer, liquor  ? Drug use: No  ? Sexual activity: Not on file  ?Other Topics Concern  ? Not on file  ?Social History Narrative  ? Not on file  ? ?Social Determinants of Health  ? ?Financial Resource Strain: Not on file  ?Food Insecurity: Not on file  ?Transportation Needs: Not on file  ?Physical Activity: Not on file  ?Stress: Not on file  ?Social Connections: Not on file  ?Intimate Partner Violence: Not on file  ? ? ?Review of systems: ?Review of Systems  ?Constitutional: Negative for fever and chills.  ?HENT: Negative.   ?Eyes: Negative for blurred vision.  ?Respiratory: as per HPI  ?Cardiovascular: Negative for chest pain and palpitations.  ?Gastrointestinal: Negative for vomiting, diarrhea, blood per rectum. ?Genitourinary: Negative for dysuria, urgency, frequency and hematuria.  ?Musculoskeletal: Negative for myalgias, back pain and joint pain.  ?Skin: Negative for itching and rash.  ?Neurological: Negative for dizziness, tremors, focal weakness, seizures and loss of consciousness.  ?Endo/Heme/Allergies: Negative for environmental allergies.   ?Psychiatric/Behavioral: Negative for depression, suicidal ideas and hallucinations.  ?All other systems reviewed and are negative. ? ?Physical Exam: ?Blood pressure 138/64, pulse (!) 101, temperature 97.7 ?F (36.5

## 2022-03-16 LAB — ANTI-NUCLEAR AB-TITER (ANA TITER)
ANA TITER: 1:40 {titer} — ABNORMAL HIGH
ANA Titer 1: 1:40 {titer} — ABNORMAL HIGH

## 2022-03-16 LAB — ANA,IFA RA DIAG PNL W/RFLX TIT/PATN
Anti Nuclear Antibody (ANA): POSITIVE — AB
Cyclic Citrullin Peptide Ab: <16 UNITS
Rheumatoid fact SerPl-aCnc: <14 IU/mL (ref <14)

## 2022-03-20 ENCOUNTER — Encounter: Payer: Self-pay | Admitting: Pulmonary Disease

## 2022-05-05 ENCOUNTER — Other Ambulatory Visit: Payer: Self-pay

## 2022-05-05 ENCOUNTER — Telehealth: Payer: Self-pay | Admitting: Gastroenterology

## 2022-05-05 DIAGNOSIS — R932 Abnormal findings on diagnostic imaging of liver and biliary tract: Secondary | ICD-10-CM

## 2022-05-05 DIAGNOSIS — K769 Liver disease, unspecified: Secondary | ICD-10-CM

## 2022-05-05 NOTE — Telephone Encounter (Signed)
Inbound call from patient stating that Dr. Fuller Plan wanted him to have a MRI done in 6 months which would put him having that in June. Patient is seeking advice if he still needs to have the MRI and if so, when. Please advise.  ?

## 2022-05-05 NOTE — Telephone Encounter (Signed)
Returned patient's call & he is aware that schedulers have been notified to schedule MRI; however, number has been provided to him in case he does not hear from them. Patient verbalized understanding, no further questions. ?

## 2022-05-11 ENCOUNTER — Telehealth: Payer: Self-pay | Admitting: Gastroenterology

## 2022-05-11 NOTE — Telephone Encounter (Signed)
I spoke with [facility] and they will call the pt and set up the MRI in an open machine with the pt.

## 2022-05-11 NOTE — Telephone Encounter (Signed)
Inbound call from patient stating that Dr. Fuller Plan had sent over a request for him to have a MRI done at [facility]. Patient stated they called him and stated he needed to go to [facility] radiology instead. Patient is requesting to be sent there to have his open MRI. Please advise.

## 2022-06-12 ENCOUNTER — Ambulatory Visit
Admission: RE | Admit: 2022-06-12 | Discharge: 2022-06-12 | Disposition: A | Discharge status: Home / Self Care | Payer: Medicare PPO | Source: Ambulatory Visit | Attending: Gastroenterology | Admitting: Gastroenterology

## 2022-06-12 DIAGNOSIS — K769 Liver disease, unspecified: Secondary | ICD-10-CM

## 2022-06-12 DIAGNOSIS — R932 Abnormal findings on diagnostic imaging of liver and biliary tract: Secondary | ICD-10-CM

## 2022-06-12 MED ORDER — GADOBENATE DIMEGLUMINE 529 MG/ML IV SOLN
17.0000 mL | Freq: Once | INTRAVENOUS | Status: AC | PRN
  Administered 2022-06-12: 17 mL via INTRAVENOUS

## 2022-07-24 ENCOUNTER — Encounter: Payer: Self-pay | Admitting: Internal Medicine

## 2022-08-04 ENCOUNTER — Inpatient Hospital Stay: Payer: Medicare PPO | Attending: Internal Medicine

## 2022-08-04 ENCOUNTER — Other Ambulatory Visit: Payer: Self-pay

## 2022-08-04 ENCOUNTER — Ambulatory Visit (HOSPITAL_COMMUNITY)
Admission: RE | Admit: 2022-08-04 | Discharge: 2022-08-04 | Disposition: A | Discharge status: Home / Self Care | Payer: Medicare PPO | Source: Ambulatory Visit | Attending: Internal Medicine | Admitting: Internal Medicine

## 2022-08-04 DIAGNOSIS — I700 Atherosclerosis of aorta: Secondary | ICD-10-CM | POA: Insufficient documentation

## 2022-08-04 DIAGNOSIS — Z905 Acquired absence of kidney: Secondary | ICD-10-CM | POA: Insufficient documentation

## 2022-08-04 DIAGNOSIS — C3490 Malignant neoplasm of unspecified part of unspecified bronchus or lung: Secondary | ICD-10-CM | POA: Diagnosis present

## 2022-08-04 DIAGNOSIS — R61 Generalized hyperhidrosis: Secondary | ICD-10-CM | POA: Insufficient documentation

## 2022-08-04 DIAGNOSIS — Z885 Allergy status to narcotic agent status: Secondary | ICD-10-CM | POA: Insufficient documentation

## 2022-08-04 DIAGNOSIS — J479 Bronchiectasis, uncomplicated: Secondary | ICD-10-CM | POA: Insufficient documentation

## 2022-08-04 DIAGNOSIS — Z85118 Personal history of other malignant neoplasm of bronchus and lung: Secondary | ICD-10-CM | POA: Insufficient documentation

## 2022-08-04 DIAGNOSIS — Z7902 Long term (current) use of antithrombotics/antiplatelets: Secondary | ICD-10-CM | POA: Insufficient documentation

## 2022-08-04 DIAGNOSIS — K5730 Diverticulosis of large intestine without perforation or abscess without bleeding: Secondary | ICD-10-CM | POA: Insufficient documentation

## 2022-08-04 DIAGNOSIS — Z9049 Acquired absence of other specified parts of digestive tract: Secondary | ICD-10-CM | POA: Insufficient documentation

## 2022-08-04 DIAGNOSIS — Z79899 Other long term (current) drug therapy: Secondary | ICD-10-CM | POA: Insufficient documentation

## 2022-08-04 DIAGNOSIS — K3580 Unspecified acute appendicitis: Secondary | ICD-10-CM | POA: Insufficient documentation

## 2022-08-04 DIAGNOSIS — Z8719 Personal history of other diseases of the digestive system: Secondary | ICD-10-CM | POA: Insufficient documentation

## 2022-08-04 DIAGNOSIS — Z79890 Hormone replacement therapy (postmenopausal): Secondary | ICD-10-CM | POA: Insufficient documentation

## 2022-08-04 DIAGNOSIS — I2510 Atherosclerotic heart disease of native coronary artery without angina pectoris: Secondary | ICD-10-CM | POA: Insufficient documentation

## 2022-08-04 DIAGNOSIS — C9110 Chronic lymphocytic leukemia of B-cell type not having achieved remission: Secondary | ICD-10-CM | POA: Insufficient documentation

## 2022-08-04 DIAGNOSIS — Z85528 Personal history of other malignant neoplasm of kidney: Secondary | ICD-10-CM | POA: Insufficient documentation

## 2022-08-04 DIAGNOSIS — J849 Interstitial pulmonary disease, unspecified: Secondary | ICD-10-CM | POA: Insufficient documentation

## 2022-08-04 DIAGNOSIS — Z882 Allergy status to sulfonamides status: Secondary | ICD-10-CM | POA: Insufficient documentation

## 2022-08-04 LAB — CBC WITH DIFFERENTIAL (CANCER CENTER ONLY)
Abs Immature Granulocytes: 0.02 10*3/uL (ref 0.00–0.07)
Basophils Absolute: 0 10*3/uL (ref 0.0–0.1)
Basophils Relative: 0 %
Eosinophils Absolute: 0.1 10*3/uL (ref 0.0–0.5)
Eosinophils Relative: 1 %
HCT: 39.1 % (ref 39.0–52.0)
Hemoglobin: 13.1 g/dL (ref 13.0–17.0)
Immature Granulocytes: 0 %
Lymphocytes Relative: 70 %
Lymphs Abs: 8.4 10*3/uL — ABNORMAL HIGH (ref 0.7–4.0)
MCH: 28.2 pg (ref 26.0–34.0)
MCHC: 33.5 g/dL (ref 30.0–36.0)
MCV: 84.1 fL (ref 80.0–100.0)
Monocytes Absolute: 0.3 10*3/uL (ref 0.1–1.0)
Monocytes Relative: 2 %
Neutro Abs: 3.2 10*3/uL (ref 1.7–7.7)
Neutrophils Relative %: 27 %
Platelet Count: 164 10*3/uL (ref 150–400)
RBC: 4.65 MIL/uL (ref 4.22–5.81)
RDW: 15 % (ref 11.5–15.5)
Smear Review: NORMAL
WBC Count: 12.1 10*3/uL — ABNORMAL HIGH (ref 4.0–10.5)
nRBC: 0 % (ref 0.0–0.2)

## 2022-08-04 LAB — CMP (CANCER CENTER ONLY)
ALT: 21 U/L (ref 0–44)
AST: 24 U/L (ref 15–41)
Albumin: 4.2 g/dL (ref 3.5–5.0)
Alkaline Phosphatase: 140 U/L — ABNORMAL HIGH (ref 38–126)
Anion gap: 8 (ref 5–15)
BUN: 28 mg/dL — ABNORMAL HIGH (ref 8–23)
CO2: 22 mmol/L (ref 22–32)
Calcium: 9.5 mg/dL (ref 8.9–10.3)
Chloride: 106 mmol/L (ref 98–111)
Creatinine: 1.88 mg/dL — ABNORMAL HIGH (ref 0.61–1.24)
GFR, Estimated: 37 mL/min — ABNORMAL LOW (ref >60)
Glucose, Bld: 120 mg/dL — ABNORMAL HIGH (ref 70–99)
Potassium: 4.5 mmol/L (ref 3.5–5.1)
Sodium: 136 mmol/L (ref 135–145)
Total Bilirubin: 0.6 mg/dL (ref 0.3–1.2)
Total Protein: 6.7 g/dL (ref 6.5–8.1)

## 2022-08-04 LAB — LACTATE DEHYDROGENASE: LDH: 125 U/L (ref 98–192)

## 2022-08-08 ENCOUNTER — Inpatient Hospital Stay: Payer: Medicare PPO | Admitting: Internal Medicine

## 2022-08-08 ENCOUNTER — Encounter: Payer: Self-pay | Admitting: Internal Medicine

## 2022-08-08 VITALS — BP 110/58 | HR 93 | Temp 97.9°F | Resp 15 | Ht 66.0 in | Wt 192.7 lb

## 2022-08-08 DIAGNOSIS — Z85528 Personal history of other malignant neoplasm of kidney: Secondary | ICD-10-CM | POA: Diagnosis not present

## 2022-08-08 DIAGNOSIS — Z9049 Acquired absence of other specified parts of digestive tract: Secondary | ICD-10-CM | POA: Diagnosis not present

## 2022-08-08 DIAGNOSIS — C9110 Chronic lymphocytic leukemia of B-cell type not having achieved remission: Secondary | ICD-10-CM | POA: Diagnosis not present

## 2022-08-08 DIAGNOSIS — I700 Atherosclerosis of aorta: Secondary | ICD-10-CM | POA: Diagnosis not present

## 2022-08-08 DIAGNOSIS — Z882 Allergy status to sulfonamides status: Secondary | ICD-10-CM | POA: Diagnosis not present

## 2022-08-08 DIAGNOSIS — Z7902 Long term (current) use of antithrombotics/antiplatelets: Secondary | ICD-10-CM | POA: Diagnosis not present

## 2022-08-08 DIAGNOSIS — J479 Bronchiectasis, uncomplicated: Secondary | ICD-10-CM | POA: Diagnosis not present

## 2022-08-08 DIAGNOSIS — C3412 Malignant neoplasm of upper lobe, left bronchus or lung: Secondary | ICD-10-CM

## 2022-08-08 DIAGNOSIS — C8300 Small cell B-cell lymphoma, unspecified site: Secondary | ICD-10-CM | POA: Diagnosis not present

## 2022-08-08 DIAGNOSIS — Z8719 Personal history of other diseases of the digestive system: Secondary | ICD-10-CM | POA: Diagnosis not present

## 2022-08-08 DIAGNOSIS — Z79890 Hormone replacement therapy (postmenopausal): Secondary | ICD-10-CM | POA: Diagnosis not present

## 2022-08-08 DIAGNOSIS — I2510 Atherosclerotic heart disease of native coronary artery without angina pectoris: Secondary | ICD-10-CM | POA: Diagnosis not present

## 2022-08-08 DIAGNOSIS — Z885 Allergy status to narcotic agent status: Secondary | ICD-10-CM | POA: Diagnosis not present

## 2022-08-08 DIAGNOSIS — K5730 Diverticulosis of large intestine without perforation or abscess without bleeding: Secondary | ICD-10-CM | POA: Diagnosis not present

## 2022-08-08 DIAGNOSIS — K3580 Unspecified acute appendicitis: Secondary | ICD-10-CM | POA: Diagnosis not present

## 2022-08-08 DIAGNOSIS — Z79899 Other long term (current) drug therapy: Secondary | ICD-10-CM | POA: Diagnosis not present

## 2022-08-08 DIAGNOSIS — Z85118 Personal history of other malignant neoplasm of bronchus and lung: Secondary | ICD-10-CM | POA: Diagnosis not present

## 2022-08-08 DIAGNOSIS — R61 Generalized hyperhidrosis: Secondary | ICD-10-CM | POA: Diagnosis not present

## 2022-08-08 DIAGNOSIS — J849 Interstitial pulmonary disease, unspecified: Secondary | ICD-10-CM | POA: Diagnosis not present

## 2022-08-08 DIAGNOSIS — Z905 Acquired absence of kidney: Secondary | ICD-10-CM | POA: Diagnosis not present

## 2022-08-08 NOTE — Progress Notes (Signed)
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  PRIOR THERAPY: Status post left upper lobe wedge resection with lymph node sampling.  CURRENT THERAPY: Observation.  INTERVAL HISTORY: Bradley Irwin 76 y.o. male returns to the clinic today for follow-up visit accompanied by his wife.  The patient is feeling fine today with no concerning complaints except for occasional night sweats and generalized fatigue.  He had several questions about his lab and scan results today.  He has no current chest pain but has shortness of breath with exertion with no cough or hemoptysis.  He has no nausea, vomiting, diarrhea or constipation.  He denied having any headache or visual changes.  He has no recent weight loss.  The patient had repeat blood work as well as CT scan of the chest, abdomen and pelvis performed recently and he is here for evaluation and discussion of his lab and imaging studies.  MEDICAL HISTORY: Past Medical History:  Diagnosis Date   Anxiety    Atypical mole 06/08/2010   Right Abdomen(moderate) Mindi Slicker)   Atypical mole 04/25/1995   Left Upper ShoulderBlade(slight)   Atypical mole 12/31/2013   Left Mid Back(Moderate) Mindi Slicker)   Colon polyps    Coronary artery disease    Eczema    GERD (gastroesophageal reflux disease)    Gout    Hearing loss    Hx of cardiovascular stress test    Lexiscan Myoview 5/16:  The study is normal. No ischemia identified. This is a low risk study. Overall left ventricular systolic function was normal. LV cavity size is normal. The left ventricular ejection fraction is normal (55-65%). Sensitivity reduced by bowel loop attenuation artifact.    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  MEDICATIONS:  Current Outpatient Medications  Medication Sig Dispense Refill   alfuzosin (UROXATRAL) 10 MG 24 hr tablet Take 10 mg by mouth daily.     allopurinol (ZYLOPRIM) 100 MG tablet Take 100 mg by mouth daily.     ALPRAZolam (XANAX) 0.5 MG tablet Take 0.5 mg by mouth 2 (two) times daily as needed for anxiety.      amLODipine (NORVASC) 10 MG tablet Take 10 mg by mouth daily.      aspirin EC 81 MG tablet Take 81 mg by mouth daily. Swallow whole.     clidinium-chlordiazePOXIDE (LIBRAX) 5-2.5 MG capsule Take 2 capsules by mouth daily as needed. Takes for  IBS     clopidogrel (PLAVIX) 75 MG tablet Take 1 tablet (75 mg total) by mouth daily. 90 tablet 3   diclofenac Sodium (VOLTAREN) 1 % GEL as directed Externally  dicyclomine (BENTYL) 20 MG tablet      ferrous sulfate 325 (65 FE) MG tablet Take 1 tablet by mouth daily.     levothyroxine (SYNTHROID) 150 MCG tablet Take 150 mcg by mouth daily before breakfast.     olmesartan (BENICAR) 5 MG tablet Take 2 tablets by mouth daily.     omeprazole (PRILOSEC) 20 MG capsule Take 20 mg by mouth 2 (two) times daily.      rosuvastatin (CRESTOR) 10 MG tablet Take 10 mg by mouth daily.     Colchicine 0.6 MG CAPS Take 1 tablet by mouth as needed. (Patient not taking: Reported on 08/08/2022)     diphenhydrAMINE (BENADRYL) 25 MG tablet Take 25 mg by mouth at bedtime. (Patient not taking: Reported on 08/08/2022)     diphenhydramine-acetaminophen (TYLENOL PM) 25-500 MG TABS tablet take 1 tablet at  bedtime Oral (Patient not taking: Reported on 08/08/2022)     Docusate Sodium (DSS) 100 MG CAPS take 2 times a day as needed for mild constipation Oral (Patient not taking: Reported on 08/08/2022)     hydrOXYzine (ATARAX) 25 MG tablet Take 25 mg by mouth as needed. (Patient not taking: Reported on 08/08/2022)     ibuprofen (ADVIL) 200 MG tablet Take 200-400 mg by mouth every 6 (six) hours as needed. (Patient not taking: Reported on 08/08/2022)     nitroGLYCERIN (NITROSTAT) 0.4 MG SL tablet Place 0.4 mg under the tongue every 5 (five) minutes as needed for chest pain. (Patient not taking: Reported on 08/08/2022)     simethicone (MYLICON) 979 MG chewable tablet Chew 125 mg by mouth every 6 (six) hours as needed for flatulence. (Patient not taking: Reported on 08/08/2022)     valACYclovir (VALTREX) 500 MG tablet Take 500 mg by mouth 2 (two) times daily as needed (fever blisters). (Patient not taking: Reported on 08/08/2022)  0   No current facility-administered medications for this visit.    SURGICAL HISTORY:  Past Surgical History:  Procedure Laterality Date   CARDIAC CATHETERIZATION     CHOLECYSTECTOMY  10/26/2011   COLONOSCOPY     CORONARY STENT PLACEMENT  2006   FINGER SURGERY Right 76 y/o   cut index rt   INTERCOSTAL NERVE BLOCK  06/16/2019   Procedure: Intercostal Nerve Block;  Surgeon: Grace Isaac, MD;  Location: [facility];  Service: Thoracic;;   LIPOMA EXCISION Left 11/18/2013   Procedure: EXCISION POSTERIOR NECK LIPOMA ;  Surgeon: Stark Klein, MD;  Location: [facility];  Service: General;  Laterality: Left;   LIVER BIOPSY  09/18/2011   LIVER LOBECTOMY  10/26/2011   LYMPH NODE DISSECTION  06/16/2019   Procedure: Lymph Node Dissection;  Surgeon: Grace Isaac, MD;  Location: [facility];  Service: Thoracic;;   NEPHRECTOMY Right 2008   right   TONSILLECTOMY     VIDEO ASSISTED THORACOSCOPY (VATS)/WEDGE RESECTION Left 06/16/2019   Procedure: VIDEO ASSISTED THORACOSCOPY  (VATS)/MINI THOROCOTOMY WITH LEFT UPPER LOBE WEDGE RESECTION;  Surgeon: Grace Isaac, MD;  Location: [facility];  Service: Thoracic;  Laterality: Left;   VIDEO BRONCHOSCOPY N/A 06/16/2019   Procedure: VIDEO BRONCHOSCOPY;  Surgeon: Grace Isaac, MD;  Location: [facility];  Service: Thoracic;  Laterality: N/A;    REVIEW OF SYSTEMS:  Constitutional: positive for fatigue and night sweats Eyes: negative Ears, nose, mouth, throat, and face: negative Respiratory: positive for dyspnea on exertion Cardiovascular: negative Gastrointestinal: negative Genitourinary:negative Integument/breast: negative Hematologic/lymphatic: negative Musculoskeletal:negative Neurological: negative Behavioral/Psych: negative Endocrine: negative Allergic/Immunologic: negative  PHYSICAL EXAMINATION: General appearance: alert, cooperative, and no distress Head: Normocephalic, without obvious abnormality, atraumatic Neck: no adenopathy, no JVD, supple, symmetrical, trachea midline, and thyroid not enlarged, symmetric, no tenderness/mass/nodules Lymph nodes: Cervical, supraclavicular, and axillary nodes normal. Resp: clear to auscultation bilaterally Back: symmetric, no curvature. ROM normal. No CVA tenderness. Cardio: regular rate and rhythm, S1, S2 normal, no murmur, click, rub or gallop GI: soft, non-tender; bowel sounds normal; no masses,  no organomegaly Extremities: extremities normal, atraumatic, no cyanosis or edema Neurologic: Alert and oriented X 3, normal strength and tone. Normal symmetric reflexes. Normal coordination and gait  ECOG PERFORMANCE STATUS: 1 - Symptomatic but completely ambulatory  Blood pressure (!) 110/58, pulse 93, temperature 97.9 F (36.6 C), temperature source Oral, resp. rate 15, height '5\' 6"'  (1.676 m), weight 192 lb 11.2 oz (87.4 kg), SpO2 98 %.  LABORATORY DATA: Lab Results  Component Value Date   WBC 12.1 (H) 08/04/2022   HGB 13.1 08/04/2022   HCT 39.1 08/04/2022   MCV  84.1 08/04/2022   PLT 164 08/04/2022      Chemistry      Component Value Date/Time   NA 136 08/04/2022 1200   K 4.5 08/04/2022 1200   CL 106 08/04/2022 1200   CO2 22 08/04/2022 1200   BUN 28 (H) 08/04/2022 1200   CREATININE 1.88 (H) 08/04/2022 1200   CREATININE 1.30 (H) 02/06/2016 1714      Component Value Date/Time   CALCIUM 9.5 08/04/2022 1200   ALKPHOS 140 (H) 08/04/2022 1200   AST 24 08/04/2022 1200   ALT 21 08/04/2022 1200   BILITOT 0.6 08/04/2022 1200       RADIOGRAPHIC STUDIES: CT Chest Wo Contrast  Result Date: 08/06/2022 CLINICAL DATA:  76 year old male with history of non-small cell lung cancer diagnosed in June 2020 and history of right renal cancer diagnosed in 2007. Additional history of small lymphocytic lymphoma. Follow-up study. * Tracking Code: BO * EXAM: CT CHEST, ABDOMEN AND PELVIS WITHOUT CONTRAST TECHNIQUE: Multidetector CT imaging of the chest, abdomen and pelvis was performed following the standard protocol without IV contrast. RADIATION DOSE REDUCTION: This exam was performed according to the departmental dose-optimization program which includes automated exposure control, adjustment of the mA and/or kV according to patient size and/or use of iterative reconstruction technique. COMPARISON:  Chest CT 02/03/2022. CT of the abdomen and pelvis 11/09/2021. FINDINGS: CT CHEST FINDINGS Cardiovascular: Heart size is normal. There is no significant pericardial fluid, thickening or pericardial calcification. There is aortic atherosclerosis, as well as atherosclerosis of the great vessels of the mediastinum and the coronary arteries, including calcified atherosclerotic plaque in the left main, left anterior descending, left circumflex and right coronary arteries. Mediastinum/Nodes: No pathologically enlarged mediastinal or hilar lymph nodes. Please note that accurate exclusion of hilar adenopathy is limited on noncontrast CT scans. Esophagus is unremarkable in appearance.  Numerous borderline enlarged and enlarged bilateral axillary lymph nodes are similar to the prior study, measuring up to 2.7 cm in short axis in the left axilla (axial image 27 of series 2). Lungs/Pleura: In the posteromedial aspect of the right upper lobe (axial image 39 of series 4) there is again a 2.0 x 1.0 cm ground-glass attenuation nodule (previously 1.7 x 1.1 cm). No other new suspicious appearing pulmonary nodules or masses are noted. No acute consolidative airspace disease. No pleural effusions. In the mid to lower lungs bilaterally there are widespread areas of septal thickening, thickening of the peribronchovascular interstitium, mild cylindrical bronchiectasis and peripheral bronchiolectasis  with some regional architectural distortion and chronic volume loss, minimally increased compared to the prior examination. Postoperative changes of wedge resection are noted in the posterior aspect of the left upper lobe where there is some stable mild postoperative architectural distortion. Musculoskeletal: There are no aggressive appearing lytic or blastic lesions noted in the visualized portions of the skeleton. CT ABDOMEN PELVIS FINDINGS Hepatobiliary: No suspicious cystic or solid hepatic lesions are confidently identified on today's noncontrast CT examination. Status post cholecystectomy. Pancreas: No definite pancreatic mass or peripancreatic fluid collections or inflammatory changes are noted on today's noncontrast CT examination. Spleen: Again noted is a well-defined 4 cm low-attenuation lesion in the posterior aspect of the spleen, previously characterized as a cyst on MRI examination 06/12/2022. Adrenals/Urinary Tract: Status post right radical nephrectomy. No unexpected soft tissue mass in the nephrectomy bed to suggest locally recurrent disease. Unenhanced appearance of the left kidney and bilateral adrenal glands is normal. No hydroureteronephrosis. Urinary bladder is nearly decompressed, but  otherwise unremarkable in appearance. Stomach/Bowel: The unenhanced appearance of the stomach is normal. There is no pathologic dilatation of small bowel or colon. Numerous colonic diverticulae are noted, particularly in the descending colon and sigmoid colon, without surrounding inflammatory changes to indicate an acute diverticulitis at this time. The appendix is not confidently identified and may be surgically absent. Regardless, there are no inflammatory changes noted adjacent to the cecum to suggest the presence of an acute appendicitis at this time. Vascular/Lymphatic: Atherosclerotic calcifications throughout the abdominal and pelvic vasculature. Multiple prominent borderline enlarged abdominal and pelvic lymph nodes, in addition to enlarged obturator lymph nodes bilaterally (2.1 cm on the right and 2.3 cm on the left, similar to prior examination. Reproductive: Prostate gland and seminal vesicles are unremarkable in appearance. Other: No significant volume of ascites.  No pneumoperitoneum. Musculoskeletal: Chronic sclerotic lesion in the right ilium adjacent to the sacroiliac joint, stable compared to several prior examinations, nonspecific, but favored to be benign. There are no other new aggressive appearing lytic or blastic lesions noted in the visualized portions of the skeleton. IMPRESSION: 1. Persistent lymphadenopathy in the chest, abdomen and pelvis, likely reflective of the patient's known lymphoma. 2. Stable size of ground-glass attenuation nodule in the right upper lobe which remains concerning for slow-growing neoplasm such as primary bronchogenic adenocarcinoma. Continued attention on follow-up studies is recommended. 3. Status post right radical nephrectomy with no findings to suggest locally recurrent disease or definite metastatic disease in the chest, abdomen or pelvis. 4. 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 Outpatient referral to Pulmonology for further clinical evaluation is recommended. 5. Aortic atherosclerosis, in addition to left main and three-vessel coronary artery disease. Assessment for potential risk factor modification, dietary therapy or pharmacologic therapy may be warranted, if clinically indicated. 6. Colonic diverticulosis without evidence of acute diverticulitis at this time. 7. Additional incidental findings, as above. Electronically Signed   By: Vinnie Langton M.D.   On: 08/06/2022 07:49   CT Abdomen Pelvis Wo Contrast  Result Date: 08/06/2022 CLINICAL DATA:  76 year old male with history of non-small cell lung cancer diagnosed in June 2020 and history of right renal cancer diagnosed in 2007. Additional history of small lymphocytic lymphoma. Follow-up study. * Tracking Code: BO * EXAM: CT CHEST, ABDOMEN AND PELVIS WITHOUT CONTRAST TECHNIQUE: Multidetector CT imaging of the chest, abdomen and pelvis was performed following the standard protocol without IV contrast. RADIATION DOSE REDUCTION: This  exam was performed according to the departmental dose-optimization program which includes automated exposure control, adjustment of the mA and/or kV according to patient size and/or use of iterative reconstruction technique. COMPARISON:  Chest CT 02/03/2022. CT of the abdomen and pelvis 11/09/2021. FINDINGS: CT CHEST FINDINGS Cardiovascular: Heart size is normal. There is no significant pericardial fluid, thickening or pericardial calcification. There is aortic atherosclerosis, as well as atherosclerosis of the great vessels of the mediastinum and the coronary arteries, including calcified atherosclerotic plaque in the left main, left anterior descending, left circumflex and right coronary arteries. Mediastinum/Nodes: No pathologically enlarged mediastinal or hilar lymph nodes. Please note that accurate exclusion of hilar adenopathy is limited on noncontrast CT  scans. Esophagus is unremarkable in appearance. Numerous borderline enlarged and enlarged bilateral axillary lymph nodes are similar to the prior study, measuring up to 2.7 cm in short axis in the left axilla (axial image 27 of series 2). Lungs/Pleura: In the posteromedial aspect of the right upper lobe (axial image 39 of series 4) there is again a 2.0 x 1.0 cm ground-glass attenuation nodule (previously 1.7 x 1.1 cm). No other new suspicious appearing pulmonary nodules or masses are noted. No acute consolidative airspace disease. No pleural effusions. In the mid to lower lungs bilaterally there are widespread areas of septal thickening, thickening of the peribronchovascular interstitium, mild cylindrical bronchiectasis and peripheral bronchiolectasis with some regional architectural distortion and chronic volume loss, minimally increased compared to the prior examination. Postoperative changes of wedge resection are noted in the posterior aspect of the left upper lobe where there is some stable mild postoperative architectural distortion. Musculoskeletal: There are no aggressive appearing lytic or blastic lesions noted in the visualized portions of the skeleton. CT ABDOMEN PELVIS FINDINGS Hepatobiliary: No suspicious cystic or solid hepatic lesions are confidently identified on today's noncontrast CT examination. Status post cholecystectomy. Pancreas: No definite pancreatic mass or peripancreatic fluid collections or inflammatory changes are noted on today's noncontrast CT examination. Spleen: Again noted is a well-defined 4 cm low-attenuation lesion in the posterior aspect of the spleen, previously characterized as a cyst on MRI examination 06/12/2022. Adrenals/Urinary Tract: Status post right radical nephrectomy. No unexpected soft tissue mass in the nephrectomy bed to suggest locally recurrent disease. Unenhanced appearance of the left kidney and bilateral adrenal glands is normal. No hydroureteronephrosis.  Urinary bladder is nearly decompressed, but otherwise unremarkable in appearance. Stomach/Bowel: The unenhanced appearance of the stomach is normal. There is no pathologic dilatation of small bowel or colon. Numerous colonic diverticulae are noted, particularly in the descending colon and sigmoid colon, without surrounding inflammatory changes to indicate an acute diverticulitis at this time. The appendix is not confidently identified and may be surgically absent. Regardless, there are no inflammatory changes noted adjacent to the cecum to suggest the presence of an acute appendicitis at this time. Vascular/Lymphatic: Atherosclerotic calcifications throughout the abdominal and pelvic vasculature. Multiple prominent borderline enlarged abdominal and pelvic lymph nodes, in addition to enlarged obturator lymph nodes bilaterally (2.1 cm on the right and 2.3 cm on the left, similar to prior examination. Reproductive: Prostate gland and seminal vesicles are unremarkable in appearance. Other: No significant volume of ascites.  No pneumoperitoneum. Musculoskeletal: Chronic sclerotic lesion in the right ilium adjacent to the sacroiliac joint, stable compared to several prior examinations, nonspecific, but favored to be benign. There are no other new aggressive appearing lytic or blastic lesions noted in the visualized portions of the skeleton. IMPRESSION: 1. Persistent lymphadenopathy in the chest, abdomen and pelvis, likely  reflective of the patient's known lymphoma. 2. Stable size of ground-glass attenuation nodule in the right upper lobe which remains concerning for slow-growing neoplasm such as primary bronchogenic adenocarcinoma. Continued attention on follow-up studies is recommended. 3. Status post right radical nephrectomy with no findings to suggest locally recurrent disease or definite metastatic disease in the chest, abdomen or pelvis. 4. Mild progression of interstitial lung disease, which at this time appears  compatible with probable usual interstitial pneumonia (UIP) per current ATS guidelines. Outpatient referral to Pulmonology for further clinical evaluation is recommended. 5. Aortic atherosclerosis, in addition to left main and three-vessel coronary artery disease. Assessment for potential risk factor modification, dietary therapy or pharmacologic therapy may be warranted, if clinically indicated. 6. Colonic diverticulosis without evidence of acute diverticulitis at this time. 7. Additional incidental findings, as above. Electronically Signed   By: Vinnie Langton M.D.   On: 08/06/2022 07:49     ASSESSMENT AND PLAN: This is a very pleasant 76 years old white male with stage Ia non-small cell lung cancer, adenocarcinoma with positive MET 14 splice mutation status post wedge resection of the left upper lobe with lymph node sampling in June 2020.  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 The patient has been on observation since that time and he is feeling fine with no concerning complaints except for fatigue and occasional night sweats. He had repeat CT scan of the chest, abdomen and pelvis performed recently.  I personally and independently reviewed the scan and discussed the result with the patient and his wife. His scan showed stable disease with persistent lymphadenopathy in the chest, abdomen and pelvis consistent with his history of lymphoma.  There was no evidence for progression of the lung cancer except for stable ground glass attenuation in the right upper lobe that need close monitoring. I recommended for the patient to continue on observation with repeat blood work in 6 months. I will repeat his imaging studies on annual basis from now on. The patient was advised to call immediately if he has any other concerning symptoms in the interval. The patient voices understanding of current disease status and treatment options and is in agreement with the  current care plan.  All questions were answered. The patient knows to call the clinic with any problems, questions or concerns. We can certainly see the patient much sooner if necessary.  Disclaimer: This note was dictated with voice recognition software. Similar sounding words can inadvertently be transcribed and may not be corrected upon review.

## 2022-08-21 ENCOUNTER — Ambulatory Visit: Payer: Medicare PPO | Admitting: Dermatology

## 2022-10-25 ENCOUNTER — Encounter: Payer: Self-pay | Admitting: Pulmonary Disease

## 2022-11-10 ENCOUNTER — Encounter: Payer: Self-pay | Admitting: Pulmonary Disease

## 2022-11-10 ENCOUNTER — Ambulatory Visit: Payer: Medicare PPO | Admitting: Pulmonary Disease

## 2022-11-10 VITALS — BP 118/66 | HR 95 | Temp 97.7°F | Ht 66.0 in | Wt 186.8 lb

## 2022-11-10 DIAGNOSIS — J849 Interstitial pulmonary disease, unspecified: Secondary | ICD-10-CM

## 2022-11-10 NOTE — Progress Notes (Signed)
Bradley Irwin    [id number]    Oct 17, 1946  Primary Care Physician:[name], Jesse Sans, MD  Referring Physician: [name], MD [address] [place],  [place] [address]  Chief complaint: Follow-up for interstitial lung disease  HPI: 76 year old with history of CL, lung cancer Diagnosed with stage Ia non-small cell lung cancer, adenocarcinoma s/p wedge resection of the left upper lobe with lymph node sampling in June 2020.  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  He is currently on observation under the care of Dr. Julien Nordmann with no active treatment.  Other history significant for kidney cancer status post right nephrectomy in 2008  Recent CT showed some interstitial abnormalities and has been referred here for further evaluation  Pets: No pets.  He had a parakeet tested positive Occupation: Used to work in [employer].  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  He has a feather pillow for the past 2 to 3 years Exposures: Possible asbestos and feather pillow exposure as above Smoking history: Quit smoking in 1984.  13-pack-year smoker Travel history: No significant travel Relevant family history: No family history of lung disease  Interim history: Here for review of CT.  States that breathing is stable.   Outpatient Encounter Medications as of 11/10/2022  Medication Sig   alfuzosin (UROXATRAL) 10 MG 24 hr tablet Take 10 mg by mouth daily.   allopurinol (ZYLOPRIM) 100 MG tablet Take 100 mg by mouth daily.   ALPRAZolam (XANAX) 0.5 MG tablet Take 0.5 mg by mouth 2 (two) times daily as needed for anxiety.    amLODipine (NORVASC) 10 MG tablet Take 10 mg by mouth daily. Taking (1/2 tab) 5mg  daily   aspirin EC 81 MG tablet Take 81 mg by mouth daily. Swallow whole.   busPIRone (BUSPAR) 7.5 MG tablet Take 7.5 mg by mouth 2 (two) times daily.   clidinium-chlordiazePOXIDE (LIBRAX) 5-2.5 MG capsule Take 2 capsules by mouth daily as needed. Takes  for  IBS   clopidogrel (PLAVIX) 75 MG tablet Take 1 tablet (75 mg total) by mouth daily.   Colchicine 0.6 MG CAPS Take 1 tablet by mouth as needed.   diclofenac Sodium (VOLTAREN) 1 % GEL as directed Externally   dicyclomine (BENTYL) 20 MG tablet    diphenhydrAMINE (BENADRYL) 25 MG tablet Take 25 mg by mouth at bedtime.   diphenhydramine-acetaminophen (TYLENOL PM) 25-500 MG TABS tablet    Docusate Sodium (DSS) 100 MG CAPS    ferrous sulfate 325 (65 FE) MG tablet Take 1 tablet by mouth daily.   hydrOXYzine (ATARAX) 25 MG tablet Take 25 mg by mouth as needed.   ibuprofen (ADVIL) 200 MG tablet Take 200-400 mg by mouth every 6 (six) hours as needed.   levothyroxine (SYNTHROID) 150 MCG tablet Take 150 mcg by mouth daily before breakfast.   nitroGLYCERIN (NITROSTAT) 0.4 MG SL tablet Place 0.4 mg under the tongue every 5 (five) minutes as needed for chest pain.   olmesartan (BENICAR) 5 MG tablet Take 2 tablets by mouth daily.   omeprazole (PRILOSEC) 20 MG capsule Take 20 mg by mouth 2 (two) times daily.    rosuvastatin (CRESTOR) 10 MG tablet Take 10 mg by mouth daily.   simethicone (MYLICON) 235 MG chewable tablet Chew 125 mg by mouth every 6 (six) hours as needed for flatulence.   valACYclovir (VALTREX) 500 MG tablet Take 500 mg by mouth 2 (two) times daily as  needed (fever blisters).   No facility-administered encounter medications on file as of 11/10/2022.   Physical Exam: Blood pressure 138/64, pulse (!) 101, temperature 97.7 F (36.5 C), temperature source Oral, height 5\' 6"  (1.676 m), weight 183 lb 3.2 oz (83.1 kg), SpO2 100 %. Gen:      No acute distress HEENT:  EOMI, sclera anicteric Neck:     No masses; no thyromegaly Lungs:    Clear to auscultation bilaterally; normal respiratory effort CV:         Regular rate and rhythm; no murmurs Abd:      + bowel sounds; soft, non-tender; no palpable masses, no distension Ext:    No edema; adequate peripheral perfusion Skin:      Warm and dry;  no rash Neuro: alert and oriented x 3 Psych: normal mood and affect  Data Reviewed: Imaging: CT chest 02/03/2022-groundglass attenuation nodule in the right upper lobe.  Worsening lymphadenopathy in bilateral axilla and subpectoral nodal station.  Widespread areas of groundglass attenuation and septal thickening bilaterally.  Indeterminate pattern.    CT chest 08/02/2022-areas of groundglass attenuation, septal thickening. I have reviewed the images personally.  PFTs: 05/14/2019 FVC 3.54 [94%], FEV1 2.97 [108%], F/F 84, TLC 5.58 [89%], DLCO 20.25 [89%] Normal test  Labs: CTD serologies 03/13/2022-ANA 1: 40, cytoplasmic speckled  Assessment:  Evaluation for interstitial lung disease.   His CT scan does show some groundglass attenuation and septal thickening in the lower lobes bilaterally.  On review of his prior imaging this has been present since 2017 and has not changed significantly.  I suspect he may have had pneumonia with postinflammatory fibrosis.  I am not convinced that this represents interstitial lung disease.  He is also asymptomatic with no symptoms of dyspnea.  CTD serologies with borderline elevation of ANA which is nonspecific.  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 Julien Nordmann, oncology and we will review those for any progression  Plan/Recommendations: ILD conference evaluation Follow-up CT scan  Marshell Garfinkel MD [facility] 11/10/2022, 2:27 PM  CC: [name], MD

## 2022-11-10 NOTE — Patient Instructions (Signed)
CT scans look stable in the opinion.  We will continue monitoring it Return to clinic in March 2024 after your follow-up CT scan ordered by oncology

## 2023-01-09 ENCOUNTER — Encounter: Payer: Self-pay | Admitting: Internal Medicine

## 2023-01-09 ENCOUNTER — Encounter: Payer: Self-pay | Admitting: Cardiovascular Disease

## 2023-01-31 ENCOUNTER — Encounter: Payer: Self-pay | Admitting: Gastroenterology

## 2023-02-08 ENCOUNTER — Inpatient Hospital Stay: Payer: Medicare PPO

## 2023-02-08 ENCOUNTER — Other Ambulatory Visit: Payer: Self-pay

## 2023-02-08 ENCOUNTER — Inpatient Hospital Stay: Payer: Medicare PPO | Attending: Internal Medicine | Admitting: Internal Medicine

## 2023-02-08 VITALS — BP 118/62 | HR 75 | Temp 98.7°F | Resp 17 | Wt 189.8 lb

## 2023-02-08 DIAGNOSIS — C3412 Malignant neoplasm of upper lobe, left bronchus or lung: Secondary | ICD-10-CM | POA: Diagnosis present

## 2023-02-08 DIAGNOSIS — Z905 Acquired absence of kidney: Secondary | ICD-10-CM | POA: Diagnosis not present

## 2023-02-08 DIAGNOSIS — C3490 Malignant neoplasm of unspecified part of unspecified bronchus or lung: Secondary | ICD-10-CM

## 2023-02-08 DIAGNOSIS — Z85528 Personal history of other malignant neoplasm of kidney: Secondary | ICD-10-CM | POA: Diagnosis not present

## 2023-02-08 DIAGNOSIS — Z8719 Personal history of other diseases of the digestive system: Secondary | ICD-10-CM | POA: Diagnosis not present

## 2023-02-08 DIAGNOSIS — Z885 Allergy status to narcotic agent status: Secondary | ICD-10-CM | POA: Diagnosis not present

## 2023-02-08 DIAGNOSIS — C9110 Chronic lymphocytic leukemia of B-cell type not having achieved remission: Secondary | ICD-10-CM

## 2023-02-08 DIAGNOSIS — Z9049 Acquired absence of other specified parts of digestive tract: Secondary | ICD-10-CM | POA: Insufficient documentation

## 2023-02-08 DIAGNOSIS — R079 Chest pain, unspecified: Secondary | ICD-10-CM | POA: Diagnosis not present

## 2023-02-08 DIAGNOSIS — Z7902 Long term (current) use of antithrombotics/antiplatelets: Secondary | ICD-10-CM | POA: Insufficient documentation

## 2023-02-08 DIAGNOSIS — R61 Generalized hyperhidrosis: Secondary | ICD-10-CM | POA: Diagnosis not present

## 2023-02-08 DIAGNOSIS — Z882 Allergy status to sulfonamides status: Secondary | ICD-10-CM | POA: Diagnosis not present

## 2023-02-08 DIAGNOSIS — Z79899 Other long term (current) drug therapy: Secondary | ICD-10-CM | POA: Diagnosis not present

## 2023-02-08 LAB — CBC WITH DIFFERENTIAL (CANCER CENTER ONLY)
Abs Immature Granulocytes: 0.03 10*3/uL (ref 0.00–0.07)
Basophils Absolute: 0 10*3/uL (ref 0.0–0.1)
Basophils Relative: 0 %
Eosinophils Absolute: 0.2 10*3/uL (ref 0.0–0.5)
Eosinophils Relative: 1 %
HCT: 42 % (ref 39.0–52.0)
Hemoglobin: 13.6 g/dL (ref 13.0–17.0)
Immature Granulocytes: 0 %
Lymphocytes Relative: 73 %
Lymphs Abs: 11 10*3/uL — ABNORMAL HIGH (ref 0.7–4.0)
MCH: 27.6 pg (ref 26.0–34.0)
MCHC: 32.4 g/dL (ref 30.0–36.0)
MCV: 85.2 fL (ref 80.0–100.0)
Monocytes Absolute: 0.4 10*3/uL (ref 0.1–1.0)
Monocytes Relative: 3 %
Neutro Abs: 3.5 10*3/uL (ref 1.7–7.7)
Neutrophils Relative %: 23 %
Platelet Count: 165 10*3/uL (ref 150–400)
RBC: 4.93 MIL/uL (ref 4.22–5.81)
RDW: 14.9 % (ref 11.5–15.5)
Smear Review: NORMAL
WBC Count: 15.2 10*3/uL — ABNORMAL HIGH (ref 4.0–10.5)
nRBC: 0 % (ref 0.0–0.2)

## 2023-02-08 LAB — CMP (CANCER CENTER ONLY)
ALT: 21 U/L (ref 0–44)
AST: 23 U/L (ref 15–41)
Albumin: 4.1 g/dL (ref 3.5–5.0)
Alkaline Phosphatase: 142 U/L — ABNORMAL HIGH (ref 38–126)
Anion gap: 7 (ref 5–15)
BUN: 29 mg/dL — ABNORMAL HIGH (ref 8–23)
CO2: 24 mmol/L (ref 22–32)
Calcium: 9.6 mg/dL (ref 8.9–10.3)
Chloride: 108 mmol/L (ref 98–111)
Creatinine: 1.84 mg/dL — ABNORMAL HIGH (ref 0.61–1.24)
GFR, Estimated: 38 mL/min — ABNORMAL LOW (ref >60)
Glucose, Bld: 101 mg/dL — ABNORMAL HIGH (ref 70–99)
Potassium: 4.4 mmol/L (ref 3.5–5.1)
Sodium: 139 mmol/L (ref 135–145)
Total Bilirubin: 0.5 mg/dL (ref 0.3–1.2)
Total Protein: 6.6 g/dL (ref 6.5–8.1)

## 2023-02-08 LAB — LACTATE DEHYDROGENASE: LDH: 121 U/L (ref 98–192)

## 2023-02-08 NOTE — Progress Notes (Signed)
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  PRIOR THERAPY: Status post left upper lobe wedge resection with lymph node sampling.  CURRENT THERAPY: Observation.  INTERVAL HISTORY: Bradley Irwin 77 y.o. male returns to clinic today for 6 months follow-up visit.  The patient is feeling fine today with no concerning complaints except for intermittent aching pain in the substernal area that is reproducible with palpation.  He denied having any neck or arm pain with no sweating or palpitation.  The patient has no shortness of breath, cough or hemoptysis.  He has no nausea, vomiting, diarrhea or constipation.  He has no headache or visual changes.  He has no recent weight loss.  He is here today for evaluation with repeat CT scan of the chest for restaging of his disease.   MEDICAL HISTORY: Past Medical History:  Diagnosis Date   Anxiety    Atypical mole 06/08/2010   Right Abdomen(moderate) Mindi Slicker)   Atypical mole 04/25/1995   Left Upper ShoulderBlade(slight)   Atypical mole 12/31/2013   Left Mid Back(Moderate) Mindi Slicker)   Colon polyps    Coronary artery disease    Eczema    GERD (gastroesophageal reflux disease)    Gout    Hearing loss    Hx of cardiovascular stress test    Lexiscan Myoview 5/16:  The study is normal. No ischemia identified. This is a low risk study. Overall left ventricular systolic function was normal. LV cavity size is normal. The left ventricular ejection fraction is normal (55-65%). Sensitivity reduced by bowel loop attenuation artifact.   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  MEDICATIONS:  Current Outpatient Medications  Medication Sig Dispense Refill   alfuzosin (UROXATRAL) 10 MG 24 hr tablet Take 10 mg by mouth daily.     allopurinol (ZYLOPRIM) 100 MG tablet Take 100 mg by mouth daily.     ALPRAZolam (XANAX) 0.5 MG tablet Take 0.5 mg by mouth 2 (two) times daily as needed for anxiety.      amLODipine (NORVASC) 10 MG tablet Take 10 mg by mouth daily. Taking (1/2 tab) 5mg  daily     aspirin EC 81 MG tablet Take 81 mg by mouth daily. Swallow whole.     busPIRone (BUSPAR) 7.5 MG tablet Take 7.5 mg by mouth 2 (two) times daily.     clidinium-chlordiazePOXIDE (LIBRAX) 5-2.5 MG capsule Take 2 capsules by mouth daily as needed. Takes for  IBS     clopidogrel (PLAVIX) 75 MG tablet Take 1 tablet (75 mg total) by mouth daily. 90 tablet 3   Colchicine 0.6 MG CAPS Take 1 tablet  by mouth as needed.     diclofenac Sodium (VOLTAREN) 1 % GEL as directed Externally     dicyclomine (BENTYL) 20 MG tablet      diphenhydrAMINE (BENADRYL) 25 MG tablet Take 25 mg by mouth at bedtime.     diphenhydramine-acetaminophen (TYLENOL PM) 25-500 MG TABS tablet      Docusate Sodium (DSS) 100 MG CAPS      ferrous sulfate 325 (65 FE) MG tablet Take 1 tablet by mouth daily.     hydrOXYzine (ATARAX) 25 MG tablet Take 25 mg by mouth as needed.     ibuprofen (ADVIL) 200 MG tablet Take 200-400 mg by mouth every 6 (six) hours as needed.     levothyroxine (SYNTHROID) 150 MCG tablet Take 150 mcg by mouth daily before breakfast.     nitroGLYCERIN (NITROSTAT) 0.4 MG SL tablet Place 0.4 mg under the tongue every 5 (five) minutes as needed for chest  pain.     olmesartan (BENICAR) 5 MG tablet Take 2 tablets by mouth daily.     omeprazole (PRILOSEC) 20 MG capsule Take 20 mg by mouth 2 (two) times daily.      rosuvastatin (CRESTOR) 10 MG tablet Take 10 mg by mouth daily.     simethicone (MYLICON) 194 MG chewable tablet Chew 125 mg by mouth every 6 (six) hours as needed for flatulence.     valACYclovir (VALTREX) 500 MG tablet Take 500 mg by mouth 2 (two) times daily as needed (fever blisters).  0   No current facility-administered medications for this visit.    SURGICAL HISTORY:  Past Surgical History:  Procedure Laterality Date   CARDIAC CATHETERIZATION     CHOLECYSTECTOMY  10/26/2011   COLONOSCOPY     CORONARY STENT PLACEMENT  2006   FINGER SURGERY Right 77 y/o   cut index rt   INTERCOSTAL NERVE BLOCK  06/16/2019   Procedure: Intercostal Nerve Block;  Surgeon: Grace Isaac, MD;  Location: [facility];  Service: Thoracic;;   LIPOMA EXCISION Left 11/18/2013   Procedure: EXCISION POSTERIOR NECK LIPOMA ;  Surgeon: Stark Klein, MD;  Location: [facility];  Service: General;  Laterality: Left;   LIVER BIOPSY  09/18/2011   LIVER LOBECTOMY  10/26/2011   LYMPH NODE DISSECTION  06/16/2019   Procedure: Lymph Node Dissection;  Surgeon: Grace Isaac, MD;  Location: [facility];  Service: Thoracic;;   NEPHRECTOMY Right 2008   right   TONSILLECTOMY     VIDEO ASSISTED THORACOSCOPY (VATS)/WEDGE RESECTION Left 06/16/2019   Procedure: VIDEO ASSISTED THORACOSCOPY (VATS)/MINI THOROCOTOMY WITH LEFT UPPER LOBE WEDGE RESECTION;  Surgeon: Grace Isaac, MD;  Location: [facility];  Service: Thoracic;  Laterality: Left;   VIDEO BRONCHOSCOPY N/A 06/16/2019   Procedure: VIDEO BRONCHOSCOPY;  Surgeon: Grace Isaac, MD;  Location: [facility];  Service: Thoracic;  Laterality: N/A;    REVIEW OF SYSTEMS:  A comprehensive review of systems was negative except for: Constitutional: positive for sweats Respiratory: positive for pleurisy/chest pain    PHYSICAL EXAMINATION: General appearance: alert, cooperative, and no distress Head: Normocephalic, without obvious abnormality, atraumatic Neck: no adenopathy, no JVD, supple, symmetrical, trachea midline, and thyroid not enlarged, symmetric, no tenderness/mass/nodules Lymph nodes: Cervical, supraclavicular, and axillary nodes normal. Resp: clear to auscultation bilaterally Back: symmetric, no curvature. ROM normal. No CVA tenderness. Cardio: regular rate and rhythm, S1, S2 normal, no murmur, click, rub or gallop GI: soft, non-tender; bowel sounds normal; no masses,  no organomegaly Extremities: extremities normal, atraumatic, no  cyanosis or edema  ECOG PERFORMANCE STATUS: 1 - Symptomatic but completely ambulatory  Blood pressure 118/62, pulse 75, temperature 98.7 F (37.1 C), temperature source Oral, resp. rate 17, weight 189 lb 12.8 oz (86.1 kg), SpO2 98 %.  LABORATORY DATA: Lab Results  Component Value Date   WBC 15.2 (H) 02/08/2023   HGB 13.6 02/08/2023   HCT 42.0 02/08/2023   MCV 85.2 02/08/2023   PLT 165 02/08/2023      Chemistry      Component Value Date/Time   NA 136 08/04/2022 1200   K 4.5 08/04/2022 1200   CL 106 08/04/2022 1200   CO2 22 08/04/2022 1200   BUN 28 (H) 08/04/2022 1200   CREATININE 1.88 (H) 08/04/2022 1200   CREATININE 1.30 (H) 02/06/2016 1714      Component Value Date/Time   CALCIUM 9.5 08/04/2022 1200   ALKPHOS 140 (H) 08/04/2022 1200   AST 24 08/04/2022 1200   ALT 21 08/04/2022 1200   BILITOT 0.6 08/04/2022 1200       RADIOGRAPHIC STUDIES: No results found.   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 The patient is currently on observation and  he is feeling fine with no concerning complaints. He had blood work performed earlier today including CBC that showed further elevation of the total white blood count up to 15.2 but he has normal hemoglobin, hematocrit and platelets count. I recommended for the patient to continue on observation with repeat CT scan of the chest and blood work in 6 months. He was advised to call immediately if he has any other concerning symptoms in the interval. The patient voices understanding of current disease status and treatment options and is in agreement with the current care plan.  All questions were answered. The patient knows to call the clinic with any problems, questions or concerns. We can certainly see the patient much sooner if necessary.  Disclaimer: This note was dictated with voice recognition software. Similar sounding words can inadvertently be transcribed and may not be corrected upon review.

## 2023-02-16 ENCOUNTER — Encounter: Payer: Self-pay | Admitting: Cardiovascular Disease

## 2023-02-16 ENCOUNTER — Ambulatory Visit: Payer: Medicare PPO | Attending: Cardiovascular Disease | Admitting: Cardiovascular Disease

## 2023-02-16 VITALS — BP 118/60 | HR 71 | Ht 66.0 in | Wt 188.6 lb

## 2023-02-16 DIAGNOSIS — I10 Essential (primary) hypertension: Secondary | ICD-10-CM

## 2023-02-16 DIAGNOSIS — E7800 Pure hypercholesterolemia, unspecified: Secondary | ICD-10-CM | POA: Diagnosis not present

## 2023-02-16 DIAGNOSIS — I2510 Atherosclerotic heart disease of native coronary artery without angina pectoris: Secondary | ICD-10-CM

## 2023-02-16 NOTE — Progress Notes (Signed)
Chief Complaint  Patient presents with   Follow-up    CAD   History of Present Illness: 77 yo male with history of CAD, HTN, HLD, renal cell carcinoma s/p right nephrectomy, lung cancer s/p resection, lymphocytic lymphoma/CLL here today for cardiac follow up. Cardiac cath February 2006 with a 2.75 x 28 mm Costar study stent (DES) placed in the mid LAD. Last cath November 2006 with patent mid LAD stent, 40% proximal LAD stenosis, 80% small diagonal branch stenosis, minimal disease in the Circumflex and RCA. Echo 03/18/13 with normal LV function, mild LVH. Nuclear stress test May 2016 with no evidence of ischemia. Lung mass found in early 2020 and biopsy was c/w adenocarcinoma. He is now s/p left upper lobe lung resection. Nuclear stress test April 2020 without ischemia. He has been diagnosed with lymphocytic lymphoma/CLL and has been told that his disease is in remission. He is followed by Dr. Julien Nordmann. Echo February 2022 with LVEF=65-70%, mild LVH, no valve disease.   He is here today for follow up. The patient denies any chest pain, dyspnea, palpitations, lower extremity edema, orthopnea, PND, dizziness, near syncope or syncope.   Primary Care Physician: [name], MD  Past Medical History:  Diagnosis Date   Anxiety    Atypical mole 06/08/2010   Right Abdomen(moderate) Mindi Slicker)   Atypical mole 04/25/1995   Left Upper ShoulderBlade(slight)   Atypical mole 12/31/2013   Left Mid Back(Moderate) Mindi Slicker)   Colon polyps    Coronary artery disease    Eczema    GERD (gastroesophageal reflux disease)    Gout    Hearing loss    Hx of cardiovascular stress test    Lexiscan Myoview 5/16:  The study is normal. No ischemia identified. This is a low risk study. Overall left ventricular systolic function was normal. LV cavity size is normal. The left ventricular ejection fraction is normal (55-65%). Sensitivity reduced by bowel loop attenuation artifact.   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   Left Scalp   Pneumonia    Psoriasis    PVD (peripheral vascular disease) ([facility])    Rash    Renal cell cancer ([place])    Squamous cell carcinoma in situ    Wears glasses    Wears hearing aid    both ears    Past Surgical History:  Procedure Laterality Date   CARDIAC CATHETERIZATION     CHOLECYSTECTOMY  10/26/2011   COLONOSCOPY     CORONARY STENT PLACEMENT  2006   FINGER SURGERY Right 77 y/o   cut index rt   INTERCOSTAL NERVE BLOCK  06/16/2019   Procedure: Intercostal Nerve Block;  Surgeon: Grace Isaac, MD;  Location: [facility];  Service: Thoracic;;   LIPOMA EXCISION Left 11/18/2013   Procedure: EXCISION POSTERIOR NECK LIPOMA ;  Surgeon: Stark Klein, MD;  Location: [facility];  Service: General;  Laterality: Left;   LIVER BIOPSY  09/18/2011   LIVER LOBECTOMY  10/26/2011   LYMPH NODE DISSECTION  06/16/2019   Procedure: Lymph Node Dissection;  Surgeon: Grace Isaac, MD;  Location: [facility];  Service: Thoracic;;   NEPHRECTOMY Right 2008   right   TONSILLECTOMY     VIDEO ASSISTED THORACOSCOPY (VATS)/WEDGE RESECTION Left 06/16/2019   Procedure: VIDEO ASSISTED THORACOSCOPY (VATS)/MINI THOROCOTOMY WITH LEFT UPPER LOBE WEDGE RESECTION;  Surgeon: Grace Isaac, MD;  Location: [facility];  Service: Thoracic;  Laterality: Left;   VIDEO BRONCHOSCOPY N/A 06/16/2019   Procedure: VIDEO BRONCHOSCOPY;  Surgeon: Grace Isaac, MD;  [facility];  Service: Thoracic;  Laterality: N/A;    Current Outpatient Medications  Medication Sig Dispense Refill   alfuzosin (UROXATRAL) 10 MG 24 hr tablet Take 10 mg by mouth daily.     allopurinol (ZYLOPRIM) 100 MG tablet Take 100 mg by mouth daily.     ALPRAZolam (XANAX) 0.5 MG tablet Take 0.5 mg by mouth 2 (two) times daily as needed for anxiety.      amLODipine (NORVASC) 10 MG tablet Take  10 mg by mouth daily. Taking (1/2 tab) '5mg'$  daily     aspirin EC 81 MG tablet Take 81 mg by mouth daily. Swallow whole.     busPIRone (BUSPAR) 7.5 MG tablet Take 7.5 mg by mouth as needed.     clidinium-chlordiazePOXIDE (LIBRAX) 5-2.5 MG capsule Take 2 capsules by mouth daily as needed. Takes for  IBS     clopidogrel (PLAVIX) 75 MG tablet Take 1 tablet (75 mg total) by mouth daily. 90 tablet 3   Colchicine 0.6 MG CAPS Take 1 tablet by mouth as needed.     diclofenac Sodium (VOLTAREN) 1 % GEL as directed Externally     dicyclomine (BENTYL) 20 MG tablet      diphenhydrAMINE (BENADRYL) 25 MG tablet Take 25 mg by mouth at bedtime.     Docusate Sodium (DSS) 100 MG CAPS      ferrous sulfate 325 (65 FE) MG tablet Take 1 tablet by mouth daily.     hydrOXYzine (ATARAX) 25 MG tablet Take 25 mg by mouth as needed.     ibuprofen (ADVIL) 200 MG tablet Take 200-400 mg by mouth every 6 (six) hours as needed.     levothyroxine (SYNTHROID) 150 MCG tablet Take 150 mcg by mouth daily before breakfast.     nitroGLYCERIN (NITROSTAT) 0.4 MG SL tablet Place 0.4 mg under the tongue every 5 (five) minutes as needed for chest pain.     olmesartan (BENICAR) 5 MG tablet Take 2 tablets by mouth daily.     omeprazole (PRILOSEC) 20 MG capsule Take 20 mg by mouth 2 (two) times daily.      rosuvastatin (CRESTOR) 10 MG tablet Take 10 mg by mouth daily.     simethicone (MYLICON) 0000000 MG chewable tablet Chew 125 mg by mouth every 6 (six) hours as needed for flatulence.     valACYclovir (VALTREX) 500 MG tablet Take 500 mg by mouth 2 (two) times daily as needed (fever blisters).  0   No current facility-administered medications for this visit.    Allergies  Allergen Reactions   Sulfa Antibiotics Other (See Comments)    Happened when he was child. Does not remember reaction.   Demerol Other (See Comments)    Severe headache.   Pseudoephedrine Other (See Comments)    Hypertension     Social History   Socioeconomic  History   Marital status: Married    Spouse name: Not on file   Number of children: 0   Years of education: Not on file   Highest education level: Not on file  Occupational History   Occupation: Retired from state of [place]  Tobacco Use   Smoking status: Former    Packs/day: 1.00    Years: 15.00    Total pack years: 15.00    Types: Cigarettes  Quit date: 10/26/1983    Years since quitting: 39.3   Smokeless tobacco: Never  Vaping Use   Vaping Use: Never used  Substance and Sexual Activity   Alcohol use: Not Currently    Alcohol/week: 14.0 - 20.0 standard drinks of alcohol    Types: 14 - 20 Glasses of wine per week    Comment: 2-3 per day - wine, beer, liquor   Drug use: No   Sexual activity: Not on file  Other Topics Concern   Not on file  Social History Narrative   Not on file   Social Determinants of Health   Financial Resource Strain: Not on file  Food Insecurity: Not on file  Transportation Needs: Not on file  Physical Activity: Not on file  Stress: Not on file  Social Connections: Not on file  Intimate Partner Violence: Not on file    Family History  Problem Relation Age of Onset   Hypertension Mother    Heart failure Mother    Heart disease Mother    Aneurysm Mother        brain   Peripheral vascular disease Father    Lung cancer Father    Hypertension Sister    Breast cancer Sister    Heart attack Maternal Grandfather    Heart attack Maternal Uncle    Colon cancer Neg Hx     Review of Systems:  As stated in the HPI and otherwise negative.   BP 118/60   Pulse 71   Ht '5\' 6"'$  (1.676 m)   Wt 85.5 kg   SpO2 98%   BMI 30.44 kg/m   Physical Examination:  General: Well developed, well nourished, NAD  HEENT: OP clear, mucus membranes moist  SKIN: warm, dry. No rashes. Neuro: No focal deficits  Musculoskeletal: Muscle strength 5/5 all ext  Psychiatric: Mood and affect normal  Neck: No JVD, no carotid bruits, no thyromegaly, no lymphadenopathy.   Lungs:Clear bilaterally, no wheezes, rhonci, crackles Cardiovascular: Regular rate and rhythm. No murmurs, gallops or rubs. Abdomen:Soft. Bowel sounds present. Non-tender.  Extremities: No lower extremity edema. Pulses are 2 + in the bilateral DP/PT.  Cardiac cath 11/02/05: 1. The left main coronary is free of critical disease.  2. The left anterior descending artery demonstrates about an area of about  30-40% narrowing segmentally in the proximal LAD overlapping the origin  of the diagonal. The diagonal is moderate in distribution, but is fairly  small in caliber. It has an 80% ostial stenosis. Compared to the  previous study there is perhaps very mild progression of disease in the  proximal LAD. The mid vessel, where the stent was previously placed  remains widely patent with excellent runoff into the distal vessel. The  distal LAD is free of critical disease.  3. The circumflex is large-caliber vessel with mild proximal irregularity  overlapping the origin of the first marginal branch. Importantly, after  the administration of intracoronary nitroglycerin this area looked more  like 30-40% due to vasodilatation of the distal vessel. However, none  of this appeared to be flow-limiting and was fairly large in caliber.  4. The right coronary artery is a large-caliber vessel. There is perhaps  mild irregularity in the mid vessel but no areas of high-grade disease.  5. Ventriculography in the RAO projection reveals vigorous global systolic  function without segmental wall motion abnormality.  Echo February 2022:  1. Left ventricular ejection fraction, by estimation, is 65 to 70%. The  left ventricle has normal function.  The left ventricle has no regional  wall motion abnormalities. There is mild left ventricular hypertrophy.  Left ventricular diastolic parameters  are consistent with Grade I diastolic dysfunction (impaired relaxation).   2. Right ventricular systolic function is normal.  The right ventricular  size is normal. Tricuspid regurgitation signal is inadequate for assessing  PA pressure.   3. The mitral valve is grossly normal. No evidence of mitral valve  regurgitation.   4. The aortic valve is tricuspid. Aortic valve regurgitation is trivial.  No aortic stenosis is present.   5. The inferior vena cava is normal in size with greater than 50%  respiratory variability, suggesting right atrial pressure of 3 mmHg.   EKG:  EKG is ordered today.  The EKG is reviewed by me and shows Sinus, rate 71 bpm. IVCD  Recent Labs: 02/08/2023: ALT 21; BUN 29; Creatinine 1.84; Hemoglobin 13.6; Platelet Count 165; Potassium 4.4; Sodium 139   Lipid Panel Followed in primary care   Wt Readings from Last 3 Encounters:  02/16/23 85.5 kg  02/08/23 86.1 kg  11/10/22 84.7 kg    Assessment and Plan:   1. CAD without angina: No chest pain suggestive of angina. Continue DAPT with ASA and Plavix (given first generation drug eluting stent). Continue statin. LV function normal by echo February 2022.    2. HTN: BP is well controlled. No changes today  3. HLD: Lipids followed in primary care. LDL 73 in October 2023. Marland Kitchen Continue statin.   Labs/ tests ordered today include:  Orders Placed This Encounter  Procedures   EKG 12-Lead   Disposition:   F/U with me in 12  months  Signed, [name], MD 02/16/2023 11:18 AM    [facility] [address], [place], [place]  [address] Phone: [phone]; Fax: [phone]

## 2023-02-16 NOTE — Patient Instructions (Signed)
Medication Instructions:  No changes *If you need a refill on your cardiac medications before your next appointment, please call your pharmacy*   Lab Work: none   Testing/Procedures: none   Follow-Up: At [facility], you and your health needs are our priority.  As part of our continuing mission to provide you with exceptional heart care, we have created designated Provider Care Teams.  These Care Teams include your primary Cardiologist (physician) and Advanced Practice Providers (APPs -  Physician Assistants and Nurse Practitioners) who all work together to provide you with the care you need, when you need it.   Your next appointment:   12 month(s)  Provider:   [name], MD

## 2023-03-27 ENCOUNTER — Other Ambulatory Visit: Payer: Self-pay | Admitting: Cardiovascular Disease

## 2023-06-19 ENCOUNTER — Other Ambulatory Visit: Payer: Self-pay | Admitting: *Deleted

## 2023-06-19 ENCOUNTER — Telehealth: Payer: Self-pay | Admitting: *Deleted

## 2023-06-19 ENCOUNTER — Telehealth: Payer: Self-pay

## 2023-06-19 DIAGNOSIS — K769 Liver disease, unspecified: Secondary | ICD-10-CM

## 2023-06-19 NOTE — Telephone Encounter (Signed)
Spoke with patient who is agreeable to do a tele visit on 6/27 at 10:40 am. Med rec and consent done.

## 2023-06-19 NOTE — Telephone Encounter (Signed)
  Patient Consent for Virtual Visit        Bradley Irwin has provided verbal consent on 06/19/2023 for a virtual visit (video or telephone).   CONSENT FOR VIRTUAL VISIT FOR:  Bradley Irwin  By participating in this virtual visit I agree to the following:  I hereby voluntarily request, consent and authorize [facility] and its employed or contracted physicians, physician assistants, nurse practitioners or other licensed health care professionals (the Practitioner), to provide me with telemedicine health care services (the "Services") as deemed necessary by the treating Practitioner. I acknowledge and consent to receive the Services by the Practitioner via telemedicine. I understand that the telemedicine visit will involve communicating with the Practitioner through live audiovisual communication technology and the disclosure of certain medical information by electronic transmission. I acknowledge that I have been given the opportunity to request an in-person assessment or other available alternative prior to the telemedicine visit and am voluntarily participating in the telemedicine visit.  I understand that I have the right to withhold or withdraw my consent to the use of telemedicine in the course of my care at any time, without affecting my right to future care or treatment, and that the Practitioner or I may terminate the telemedicine visit at any time. I understand that I have the right to inspect all information obtained and/or recorded in the course of the telemedicine visit and may receive copies of available information for a reasonable fee.  I understand that some of the potential risks of receiving the Services via telemedicine include:  Delay or interruption in medical evaluation due to technological equipment failure or disruption; Information transmitted may not be sufficient (e.g. poor resolution of images) to allow for appropriate medical decision making by the  Practitioner; and/or  In rare instances, security protocols could fail, causing a breach of personal health information.  Furthermore, I acknowledge that it is my responsibility to provide information about my medical history, conditions and care that is complete and accurate to the best of my ability. I acknowledge that Practitioner's advice, recommendations, and/or decision may be based on factors not within their control, such as incomplete or inaccurate data provided by me or distortions of diagnostic images or specimens that may result from electronic transmissions. I understand that the practice of medicine is not an exact science and that Practitioner makes no warranties or guarantees regarding treatment outcomes. I acknowledge that a copy of this consent can be made available to me via my patient portal [facility] MyChart), or I can request a printed copy by calling the office of [facility].    I understand that my insurance will be billed for this visit.   I have read or had this consent read to me. I understand the contents of this consent, which adequately explains the benefits and risks of the Services being provided via telemedicine.  I have been provided ample opportunity to ask questions regarding this consent and the Services and have had my questions answered to my satisfaction. I give my informed consent for the services to be provided through the use of telemedicine in my medical care

## 2023-06-19 NOTE — Telephone Encounter (Signed)
[facility] Pre-operative Risk Assessment     Request for surgical clearance:     Endoscopy Procedure  What type of surgery is being performed?     colonoscopy  When is this surgery scheduled?     07/03/23  What type of clearance is required ?   Pharmacy  Are there any medications that need to be held prior to surgery and how long? Plavix for 5 days  Practice name and name of physician performing surgery?      [facility]-Dr. Russella Dar  What is your office phone and fax number?      Phone- [phone]  Fax- [phone]  Anesthesia type (None, local, MAC, general) ?       MAC

## 2023-06-19 NOTE — Telephone Encounter (Signed)
The patient was called to inform of an MRI ABD, in the process, the patient asked when is his colonoscopy scheduled? Found that the patient's 10 year colonoscopy was supposed to be scheduled for 1/24. Colonoscopy was scheduled for 07/03/23. Patient was also scheduled a office previsit for 06/26/23. Patient was informed the MRI ABD will be scheduled via imaging and they will be calling to schedule the appt. Dr. Verne Carrow, MD was sent a email for anticoag clearance. Patient understood and agrees.

## 2023-06-19 NOTE — Telephone Encounter (Signed)
-----   Message from Emeline Darling, RN sent at 06/19/2023 12:14 PM EDT -----  ----- Message ----- From: Lucky Cowboy, RN Sent: 06/16/2023   8:00 AM EDT To: Lucky Cowboy, RN  Patient needs MRI 1 year follow up.

## 2023-06-19 NOTE — Telephone Encounter (Signed)
Please see reminder below

## 2023-06-19 NOTE — Telephone Encounter (Signed)
-----   Message from Lucky Cowboy, RN sent at 06/15/2022  9:05 AM EDT ----- Patient needs MRI 1 year follow up.

## 2023-06-19 NOTE — Telephone Encounter (Signed)
   Name: Bradley Irwin  DOB: 06/17/1946  MRN: [id number]  Primary Cardiologist: Verne Carrow, MD   Preoperative team, please contact this patient and set up a phone call appointment for further preoperative risk assessment. Please obtain consent and complete medication review. Thank you for your help.  I confirm that guidance regarding antiplatelet and oral anticoagulation therapy has been completed and, if necessary, noted below.   His Plavix may be held for 5 days prior to his procedure. Please resume as soon as hemostasis is achieved.   Ronney Asters, NP 06/19/2023, 1:28 PM [facility]

## 2023-06-21 ENCOUNTER — Ambulatory Visit: Payer: Medicare PPO | Attending: Cardiology

## 2023-06-21 DIAGNOSIS — Z01810 Encounter for preprocedural cardiovascular examination: Secondary | ICD-10-CM | POA: Diagnosis not present

## 2023-06-21 NOTE — Progress Notes (Signed)
Virtual Visit via Telephone Note   Because of Bradley Irwin's co-morbid illnesses, he is at least at moderate risk for complications without adequate follow up.  This format is felt to be most appropriate for this patient at this time.  The patient did not have access to video technology/had technical difficulties with video requiring transitioning to audio format only (telephone).  All issues noted in this document were discussed and addressed.  No physical exam could be performed with this format.  Please refer to the patient's chart for his consent to telehealth for [facility].  Evaluation Performed:  Preoperative cardiovascular risk assessment _____________   Date:  06/21/2023   Patient ID:  Bradley Irwin, DOB 05/29/1946, MRN [id number] Patient Location:  Home Provider location:   Office  Primary Care Provider:  [name], MD Primary Cardiologist:  Verne Carrow, MD  Chief Complaint / Patient Profile   77 y.o. y/o male with a h/o CAD, HTN, HLD, renal cell carcinoma s/p right nephrectomy, lung cancer s/p resection, lymphocytic lymphoma/CLL  who is pending endoscopy and presents today for telephonic preoperative cardiovascular risk assessment.  History of Present Illness    Bradley Irwin is a 77 y.o. male who presents via audio/video conferencing for a telehealth visit today.  Pt was last seen in cardiology clinic on 02/16/2023 by Dr. Clifton James.  At that time ADDIE CEDERBERG was doing well with no new cardiac complaints. The patient is now pending procedure as outlined above. Since his last visit, he that he is doing well with no new cardiac complaints since previous visit.  Past Medical History    Past Medical History:  Diagnosis Date   Anxiety    Atypical mole 06/08/2010   Right Abdomen(moderate) Nino Glow)   Atypical mole 04/25/1995   Left Upper ShoulderBlade(slight)   Atypical mole 12/31/2013   Left Mid Back(Moderate) Nino Glow)   Colon  polyps    Coronary artery disease    Eczema    GERD (gastroesophageal reflux disease)    Gout    Hearing loss    Hx of cardiovascular stress test    Lexiscan Myoview 5/16:  The study is normal. No ischemia identified. This is a low risk study. Overall left ventricular systolic function was normal. LV cavity size is normal. The left ventricular ejection fraction is normal (55-65%). Sensitivity reduced by bowel loop attenuation artifact.   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   Pneumonia    Psoriasis    PVD (peripheral vascular disease) (HCC)    Rash    Renal cell cancer (HCC)    Squamous cell carcinoma in situ    Wears glasses    Wears hearing aid    both ears   Past Surgical History:  Procedure Laterality Date   CARDIAC CATHETERIZATION     CHOLECYSTECTOMY  10/26/2011   COLONOSCOPY     CORONARY STENT PLACEMENT  2006   FINGER SURGERY Right 77 y/o   cut index rt   INTERCOSTAL NERVE BLOCK  06/16/2019   Procedure: Intercostal Nerve Block;  Surgeon: Delight Ovens, MD;  Location: [facility] OR;  Service: Thoracic;;   LIPOMA EXCISION Left 11/18/2013   Procedure: EXCISION POSTERIOR NECK LIPOMA ;  Surgeon: Almond Lint, MD;  Location: [facility];  Service: General;  Laterality:  Left;   LIVER BIOPSY  09/18/2011   LIVER LOBECTOMY  10/26/2011   LYMPH NODE DISSECTION  06/16/2019   Procedure: Lymph Node Dissection;  Surgeon: Delight Ovens, MD;  Location: [facility] OR;  Service: Thoracic;;   NEPHRECTOMY Right 2008   right   TONSILLECTOMY     VIDEO ASSISTED THORACOSCOPY (VATS)/WEDGE RESECTION Left 06/16/2019   Procedure: VIDEO ASSISTED THORACOSCOPY (VATS)/MINI THOROCOTOMY WITH LEFT UPPER LOBE WEDGE RESECTION;  Surgeon: Delight Ovens, MD;  Location: [facility] OR;  Service: Thoracic;  Laterality: Left;   VIDEO BRONCHOSCOPY N/A 06/16/2019    Procedure: VIDEO BRONCHOSCOPY;  Surgeon: Delight Ovens, MD;  Location: [facility] OR;  Service: Thoracic;  Laterality: N/A;    Allergies  Allergies  Allergen Reactions   Sulfa Antibiotics Other (See Comments)    Happened when he was child. Does not remember reaction.   Demerol Other (See Comments)    Severe headache.   Pseudoephedrine Other (See Comments)    Hypertension     Home Medications    Prior to Admission medications   Medication Sig Start Date End Date Taking? Authorizing Provider  alfuzosin (UROXATRAL) 10 MG 24 hr tablet Take 10 mg by mouth daily. 08/07/22   [provider]  allopurinol (ZYLOPRIM) 100 MG tablet Take 100 mg by mouth daily.    [provider]  ALPRAZolam Prudy Feeler) 0.5 MG tablet Take 0.5 mg by mouth 2 (two) times daily as needed for anxiety.     [provider]  amLODipine (NORVASC) 10 MG tablet Take 10 mg by mouth daily. Taking (1/2 tab) 5mg  daily 01/26/20   [provider]  aspirin EC 81 MG tablet Take 81 mg by mouth daily. Swallow whole.    [provider]  busPIRone (BUSPAR) 7.5 MG tablet Take 7.5 mg by mouth as needed. 10/24/22   [provider]  clidinium-chlordiazePOXIDE (LIBRAX) 5-2.5 MG capsule Take 2 capsules by mouth daily as needed. Takes for  IBS    [provider]  clopidogrel (PLAVIX) 75 MG tablet TAKE 1 TABLET BY MOUTH EVERY DAY 03/28/23   Kathleene Hazel, MD  Colchicine 0.6 MG CAPS Take 1 tablet by mouth as needed. 04/18/21   [provider]  diclofenac Sodium (VOLTAREN) 1 % GEL as directed Externally 04/20/21   [provider]  dicyclomine (BENTYL) 20 MG tablet  01/01/20   [provider]  diphenhydrAMINE (BENADRYL) 25 MG tablet Take 25 mg by mouth at bedtime.    [provider]  Docusate Sodium (DSS) 100 MG CAPS  02/10/20   [provider]  ferrous sulfate 325 (65 FE) MG tablet Take 1 tablet by mouth daily. 08/01/21   [provider]   hydrOXYzine (ATARAX) 25 MG tablet Take 25 mg by mouth as needed.    [provider]  ibuprofen (ADVIL) 200 MG tablet Take 200-400 mg by mouth every 6 (six) hours as needed.    [provider]  levothyroxine (SYNTHROID) 150 MCG tablet Take 150 mcg by mouth daily before breakfast.    [provider]  nitroGLYCERIN (NITROSTAT) 0.4 MG SL tablet Place 0.4 mg under the tongue every 5 (five) minutes as needed for chest pain.    [provider]  olmesartan (BENICAR) 5 MG tablet Take 2 tablets by mouth daily. 08/01/21   [provider]  omeprazole (PRILOSEC) 20 MG capsule Take 20 mg by mouth 2 (two) times daily.     [provider]  rosuvastatin (CRESTOR) 10 MG tablet Take  10 mg by mouth daily.    [provider]  simethicone (MYLICON) 125 MG chewable tablet Chew 125 mg by mouth every 6 (six) hours as needed for flatulence.    [provider]  valACYclovir (VALTREX) 500 MG tablet Take 500 mg by mouth 2 (two) times daily as needed (fever blisters). 12/22/14   [provider]  Vibegron (GEMTESA) 75 MG TABS Take 1 tablet by mouth daily.    [provider]    Physical Exam    Vital Signs:  Iver Nestle does not have vital signs available for review today.  Given telephonic nature of communication, physical exam is limited. AAOx3. NAD. Normal affect.  Speech and respirations are unlabored.  Accessory Clinical Findings    None  Assessment & Plan    1.  Preoperative Cardiovascular Risk Assessment:  Patient's RCRI score is 0.4% The patient affirms he has been doing well without any new cardiac symptoms. They are able to achieve 5 METS without cardiac limitations. Therefore, based on ACC/AHA guidelines, the patient would be at acceptable risk for the planned procedure without further cardiovascular testing. The patient was advised that if he develops new symptoms prior to surgery to contact our office to arrange  for a follow-up visit, and he verbalized understanding.   The patient was advised that if he develops new symptoms prior to surgery to contact our office to arrange for a follow-up visit, and he verbalized understanding.  Patient can hold Plavix 5 days prior to procedure and should restart postprocedure when surgically safe.  A copy of this note will be routed to requesting surgeon.  Time:   Today, I have spent 6 minutes with the patient with telehealth technology discussing medical history, symptoms, and management plan.     Napoleon Form, Leodis Rains, NP  06/21/2023, 7:12 AM

## 2023-06-26 ENCOUNTER — Ambulatory Visit: Payer: Medicare PPO | Admitting: Physician Assistant

## 2023-06-26 ENCOUNTER — Telehealth: Payer: Self-pay | Admitting: *Deleted

## 2023-06-26 ENCOUNTER — Encounter: Payer: Self-pay | Admitting: Physician Assistant

## 2023-06-26 VITALS — BP 110/50 | HR 100 | Ht 65.0 in | Wt 185.2 lb

## 2023-06-26 DIAGNOSIS — Z1211 Encounter for screening for malignant neoplasm of colon: Secondary | ICD-10-CM

## 2023-06-26 DIAGNOSIS — Z7901 Long term (current) use of anticoagulants: Secondary | ICD-10-CM | POA: Diagnosis not present

## 2023-06-26 MED ORDER — NA SULFATE-K SULFATE-MG SULF 17.5-3.13-1.6 GM/177ML PO SOLN: 1.0000 | Freq: Once | ORAL | 0 refills | Status: AC

## 2023-06-26 NOTE — Progress Notes (Signed)
Chief Complaint: Discuss colonoscopy in a patient on chronic anticoagulation  HPI:    Bradley Irwin a 77 y/o male, known to Dr. Russella Dar, with a past medical history as listed below including GERD, IBS, PVD (01/25/2021 echo with LVEF 65-70%) and renal cell cancer on Plavix, who was referred to me by [name], MD for consideration of colonoscopy on chronic anticoagulation.    01/14/2013 colonoscopy with Dr. Russella Dar with mild diverticulosis in the sigmoid colon small internal hemorrhoids.  Repeat recommended in 10 years.    02/08/2023 CBC with a white count of 15.2 and CMP with a creatinine of 1.84 and glucose 101.    06/21/2023 preoperative clearance from cardiology with recommendations to hold Plavix for 5 days prior to procedure.    Today, patient presents to clinic and explains that he has already been scheduled for his colonoscopy with Dr. Russella Dar.  He is aware that he needs to hold his Plavix for 5 days.  Tells me he has actually already heard from his cardiologist who told him that was fine.  Denies any acute GI issues.  Describes chronic IBS and radiating back-and-forth from diarrhea to constipation, currently slightly more constipated but still having bowel movements with no need of his regular stool softeners.    Denies fever, chills, weight loss, abdominal pain, heartburn or reflux.  Past Medical History:  Diagnosis Date   Anxiety    Atypical mole 06/08/2010   Right Abdomen(moderate) Nino Glow)   Atypical mole 04/25/1995   Left Upper ShoulderBlade(slight)   Atypical mole 12/31/2013   Left Mid Back(Moderate) Nino Glow)   Colon polyps    Coronary artery disease    Eczema    GERD (gastroesophageal reflux disease)    Gout    Hearing loss    Hx of cardiovascular stress test    Lexiscan Myoview 5/16:  The study is normal. No ischemia identified. This is a low risk study. Overall left ventricular systolic function was normal. LV cavity size is normal. The left ventricular ejection fraction  is normal (55-65%). Sensitivity reduced by bowel loop attenuation artifact.   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   Pneumonia    Psoriasis    PVD (peripheral vascular disease) (HCC)    Rash    Renal cell cancer (HCC)    Squamous cell carcinoma in situ    Wears glasses    Wears hearing aid    both ears    Past Surgical History:  Procedure Laterality Date   CARDIAC CATHETERIZATION     CHOLECYSTECTOMY  10/26/2011   COLONOSCOPY     CORONARY STENT PLACEMENT  2006   FINGER SURGERY Right 77 y/o   cut index rt   INTERCOSTAL NERVE BLOCK  06/16/2019   Procedure: Intercostal Nerve Block;  Surgeon: Delight Ovens, MD;  Location: [facility] OR;  Service: Thoracic;;   LIPOMA EXCISION Left 11/18/2013   Procedure: EXCISION POSTERIOR NECK LIPOMA ;  Surgeon: Almond Lint, MD;  Location:  SUR[facility]ERY [facility];  Service: General;  Laterality: Left;   LIVER BIOPSY  09/18/2011   LIVER LOBECTOMY  10/26/2011   LYMPH NODE DISSECTION  06/16/2019   Procedure: Lymph Node Dissection;  Surgeon: Delight Ovens, MD;  Location: [facility] OR;  Service: Thoracic;;   NEPHRECTOMY Right 2008   right   TONSILLECTOMY  VIDEO ASSISTED THORACOSCOPY (VATS)/WEDGE RESECTION Left 06/16/2019   Procedure: VIDEO ASSISTED THORACOSCOPY (VATS)/MINI THOROCOTOMY WITH LEFT UPPER LOBE WEDGE RESECTION;  Surgeon: Delight Ovens, MD;  Location: [facility] OR;  Service: Thoracic;  Laterality: Left;   VIDEO BRONCHOSCOPY N/A 06/16/2019   Procedure: VIDEO BRONCHOSCOPY;  Surgeon: Delight Ovens, MD;  Location: [facility] OR;  Service: Thoracic;  Laterality: N/A;    Current Outpatient Medications  Medication Sig Dispense Refill   alfuzosin (UROXATRAL) 10 MG 24 hr tablet Take 10 mg by mouth daily.     allopurinol (ZYLOPRIM) 100 MG tablet Take 100 mg by mouth daily.     ALPRAZolam (XANAX) 0.5 MG  tablet Take 0.5 mg by mouth 2 (two) times daily as needed for anxiety.      amLODipine (NORVASC) 10 MG tablet Take 10 mg by mouth daily. Taking (1/2 tab) 5mg  daily     aspirin EC 81 MG tablet Take 81 mg by mouth daily. Swallow whole.     busPIRone (BUSPAR) 7.5 MG tablet Take 7.5 mg by mouth as needed.     clidinium-chlordiazePOXIDE (LIBRAX) 5-2.5 MG capsule Take 2 capsules by mouth daily as needed. Takes for  IBS     clopidogrel (PLAVIX) 75 MG tablet TAKE 1 TABLET BY MOUTH EVERY DAY 90 tablet 3   Colchicine 0.6 MG CAPS Take 1 tablet by mouth as needed.     diclofenac Sodium (VOLTAREN) 1 % GEL as directed Externally     dicyclomine (BENTYL) 20 MG tablet      diphenhydrAMINE (BENADRYL) 25 MG tablet Take 25 mg by mouth at bedtime.     Docusate Sodium (DSS) 100 MG CAPS      ferrous sulfate 325 (65 FE) MG tablet Take 1 tablet by mouth daily.     hydrOXYzine (ATARAX) 25 MG tablet Take 25 mg by mouth as needed.     ibuprofen (ADVIL) 200 MG tablet Take 200-400 mg by mouth every 6 (six) hours as needed.     levothyroxine (SYNTHROID) 150 MCG tablet Take 150 mcg by mouth daily before breakfast.     nitroGLYCERIN (NITROSTAT) 0.4 MG SL tablet Place 0.4 mg under the tongue every 5 (five) minutes as needed for chest pain.     olmesartan (BENICAR) 5 MG tablet Take 2 tablets by mouth daily.     omeprazole (PRILOSEC) 20 MG capsule Take 20 mg by mouth 2 (two) times daily.      rosuvastatin (CRESTOR) 10 MG tablet Take 10 mg by mouth daily.     simethicone (MYLICON) 125 MG chewable tablet Chew 125 mg by mouth every 6 (six) hours as needed for flatulence.     valACYclovir (VALTREX) 500 MG tablet Take 500 mg by mouth 2 (two) times daily as needed (fever blisters).  0   Vibegron (GEMTESA) 75 MG TABS Take 1 tablet by mouth daily.     No current facility-administered medications for this visit.    Allergies as of 06/26/2023 - Review Complete 06/26/2023  Allergen Reaction Noted   Sulfa antibiotics Other (See  Comments) 05/18/2011   Demerol Other (See Comments) 09/12/2011   Pseudoephedrine Other (See Comments) 10/27/2011    Family History  Problem Relation Age of Onset   Hypertension Mother    Heart failure Mother    Heart disease Mother    Aneurysm Mother        brain   Peripheral vascular disease Father    Lung cancer Father    Hypertension Sister    Breast cancer  Sister    Heart attack Maternal Grandfather    Heart attack Maternal Uncle    Colon cancer Neg Hx     Social History   Socioeconomic History   Marital status: Married    Spouse name: Not on file   Number of children: 0   Years of education: Not on file   Highest education level: Not on file  Occupational History   Occupation: Retired from state of [place]  Tobacco Use   Smoking status: Former    Packs/day: 1.00    Years: 15.00    Additional pack years: 0.00    Total pack years: 15.00    Types: Cigarettes    Quit date: 10/26/1983    Years since quitting: 39.6   Smokeless tobacco: Never  Vaping Use   Vaping Use: Never used  Substance and Sexual Activity   Alcohol use: Not Currently    Alcohol/week: 14.0 - 20.0 standard drinks of alcohol    Types: 14 - 20 Glasses of wine per week    Comment: 2-3 per day - wine, beer, liquor   Drug use: No   Sexual activity: Not on file  Other Topics Concern   Not on file  Social History Narrative   Not on file   Social Determinants of Health   Financial Resource Strain: Not on file  Food Insecurity: Not on file  Transportation Needs: Not on file  Physical Activity: Not on file  Stress: Not on file  Social Connections: Not on file  Intimate Partner Violence: Not on file    Review of Systems:    Constitutional: No weight loss, fever or chills Skin: No rash  Cardiovascular: No chest pain Respiratory: No SOB  Gastrointestinal: See HPI and otherwise negative Genitourinary: No dysuria Neurological: No headache, dizziness or syncope Musculoskeletal: No new muscle or  joint pain Hematologic: No bleeding  Psychiatric: No history of depression or anxiety   Physical Exam:  Vital signs: BP (!) 110/50 (BP Location: Left Arm, Patient Position: Sitting, Cuff Size: Normal)   Pulse 100   Ht 5\' 5"  (1.651 m)   Wt 185 lb 4 oz (84 kg)   BMI 30.83 kg/m    Constitutional:   Pleasant elderly Caucasian male appears to be in NAD, Well developed, Well nourished, alert and cooperative Respiratory: Respirations even and unlabored. Lungs clear to auscultation bilaterally.   No wheezes, crackles, or rhonchi.  Cardiovascular: Normal S1, S2. No MRG. Regular rate and rhythm. No peripheral edema, cyanosis or pallor.  Gastrointestinal:  Soft, nondistended, nontender. No rebound or guarding. Normal bowel sounds. No appreciable masses or hepatomegaly. Rectal:  Not performed.  Psychiatric: Oriented to person, place and time. Demonstrates good judgement and reason without abnormal affect or behaviors.  RELEVANT LABS AND IMAGING: CBC    Component Value Date/Time   WBC 15.2 (H) 02/08/2023 1255   WBC 12.2 (H) 02/09/2020 1205   RBC 4.93 02/08/2023 1255   HGB 13.6 02/08/2023 1255   HCT 42.0 02/08/2023 1255   PLT 165 02/08/2023 1255   MCV 85.2 02/08/2023 1255   MCV 85.5 02/06/2016 1715   MCH 27.6 02/08/2023 1255   MCHC 32.4 02/08/2023 1255   RDW 14.9 02/08/2023 1255   LYMPHSABS 11.0 (H) 02/08/2023 1255   MONOABS 0.4 02/08/2023 1255   EOSABS 0.2 02/08/2023 1255   BASOSABS 0.0 02/08/2023 1255    CMP     Component Value Date/Time   NA 139 02/08/2023 1255   K 4.4 02/08/2023 1255  CL 108 02/08/2023 1255   CO2 24 02/08/2023 1255   GLUCOSE 101 (H) 02/08/2023 1255   BUN 29 (H) 02/08/2023 1255   CREATININE 1.84 (H) 02/08/2023 1255   CREATININE 1.30 (H) 02/06/2016 1714   CALCIUM 9.6 02/08/2023 1255   PROT 6.6 02/08/2023 1255   ALBUMIN 4.1 02/08/2023 1255   AST 23 02/08/2023 1255   ALT 21 02/08/2023 1255   ALKPHOS 142 (H) 02/08/2023 1255   BILITOT 0.5 02/08/2023 1255    GFRNONAA 38 (L) 02/08/2023 1255   GFRNONAA 56 (L) 02/06/2016 1714   GFRAA 47 (L) 07/30/2020 0958   GFRAA 65 02/06/2016 1714    Assessment: 1.  Screening for colorectal cancer: Patient due for screening colonoscopy, last was 10 years ago normal 2.  Chronic anticoagulation: On Plavix  Plan: 1.  Patient already scheduled for screening colonoscopy on 07/03/2023.  Did discuss risks, benefits, limitations and alternatives and he agrees to proceed. 2.  Patient was advised to hold his Plavix for 5 days prior to time of procedure.  We have already received clearance from his cardiologist. 3.  Patient to follow in clinic per recommendations after time of procedure.  Hyacinth Meeker, PA-C [facility] 06/26/2023, 10:08 AM  Cc: [name], MD

## 2023-06-26 NOTE — Telephone Encounter (Signed)
[facility] Pre-operative Risk Assessment     Request for surgical clearance:     Endoscopy Procedure  What type of surgery is being performed?     Colonoscopy  When is this surgery scheduled?     07/03/2023  What type of clearance is required ?   Pharmacy  Are there any medications that need to be held prior to surgery and how long? PLAVIX  5 DAYS  Practice name and name of physician performing surgery?      [facility]  Dr Russella Dar   What is your office phone and fax number?      Phone- [phone]  Fax- [phone]  Anesthesia type (None, local, MAC, general) ?       MAC

## 2023-06-26 NOTE — Patient Instructions (Signed)
You will be contacted by our office prior to your procedure for directions on holding your Plavix.  If you do not hear from our office 1 week prior to your scheduled procedure, please call [phone] to discuss.    Due to recent changes in healthcare laws, you may see the results of your imaging and laboratory studies on MyChart before your provider has had a chance to review them.  We understand that in some cases there may be results that are confusing or concerning to you. Not all laboratory results come back in the same time frame and the provider may be waiting for multiple results in order to interpret others.  Please give [place] 48 hours in order for your provider to thoroughly review all the results before contacting the office for clarification of your results.    Follow up per recommendations after your procedure   You have been scheduled for a colonoscopy. Please follow written instructions given to you at your visit today.   Please pick up your prep supplies at the pharmacy within the next 1-3 days.  If you use inhalers (even only as needed), please bring them with you on the day of your procedure.  DO NOT TAKE 7 DAYS PRIOR TO TEST- Trulicity (dulaglutide) Ozempic, Wegovy (semaglutide) Mounjaro (tirzepatide) Bydureon Bcise (exanatide extended release)  DO NOT TAKE 1 DAY PRIOR TO YOUR TEST Rybelsus (semaglutide) Adlyxin (lixisenatide) Victoza (liraglutide) Byetta (exanatide) ___________________________________________________________________________   I appreciate the  opportunity to care for you  Thank You   Jacelyn Grip

## 2023-06-26 NOTE — Telephone Encounter (Signed)
See 6/27 telephone encounter Patient already informed ok to hold his Plavix 5 days before procedure

## 2023-07-02 ENCOUNTER — Telehealth: Payer: Self-pay | Admitting: Physician Assistant

## 2023-07-02 NOTE — Telephone Encounter (Addendum)
Returned patients call he asked could he chew gum before his procedure I told him no gum,hard candy,mints,water or anything 3 hours before his procedure. I Told him he doesn't  have to hold Doxycycline or Probiotic before his procedure he said he takes his meds at 8 am . He has already been holding his blood thinner

## 2023-07-02 NOTE — Telephone Encounter (Signed)
Inbound call from patient wanting to inform that the recently started a new antibiotic medication. Also requesting a call back to discuss if he can still take his probiotic supplement before upcoming procedure tomorrow 7/9. Please advise, thank you.

## 2023-07-03 ENCOUNTER — Encounter: Payer: Self-pay | Admitting: Gastroenterology

## 2023-07-03 ENCOUNTER — Ambulatory Visit (AMBULATORY_SURGERY_CENTER): Payer: Medicare PPO | Admitting: Gastroenterology

## 2023-07-03 VITALS — BP 94/53 | HR 73 | Temp 98.5°F | Resp 14 | Ht 65.0 in | Wt 185.0 lb

## 2023-07-03 DIAGNOSIS — D122 Benign neoplasm of ascending colon: Secondary | ICD-10-CM | POA: Diagnosis not present

## 2023-07-03 DIAGNOSIS — D123 Benign neoplasm of transverse colon: Secondary | ICD-10-CM | POA: Diagnosis not present

## 2023-07-03 DIAGNOSIS — Z1211 Encounter for screening for malignant neoplasm of colon: Secondary | ICD-10-CM

## 2023-07-03 DIAGNOSIS — D120 Benign neoplasm of cecum: Secondary | ICD-10-CM | POA: Diagnosis not present

## 2023-07-03 MED ORDER — SODIUM CHLORIDE 0.9 % IV SOLN
500.0000 mL | Freq: Once | INTRAVENOUS | Status: DC
Start: 2023-07-03 — End: 2023-07-03

## 2023-07-03 NOTE — Progress Notes (Signed)
Called to room to assist during endoscopic procedure.  Patient ID and intended procedure confirmed with present staff. Received instructions for my participation in the procedure from the performing physician.  

## 2023-07-03 NOTE — Op Note (Signed)
[facility] Patient Name: Bradley Irwin Procedure Date: 07/03/2023 3:14 PM MRN: [id number] Endoscopist: Meryl Dare , MD, [phone] Age: 77 Referring MD:  Date of Birth: 1946/05/02 Gender: Male Account #: [id number] Procedure:                Colonoscopy Indications:              Screening for colorectal malignant neoplasm Medicines:                Monitored Anesthesia Care Procedure:                Pre-Anesthesia Assessment:                           - Prior to the procedure, a History and Physical                            was performed, and patient medications and                            allergies were reviewed. The patient's tolerance of                            previous anesthesia was also reviewed. The risks                            and benefits of the procedure and the sedation                            options and risks were discussed with the patient.                            All questions were answered, and informed consent                            was obtained. Prior Anticoagulants: The patient has                            taken Plavix (clopidogrel), last dose was 5 days                            prior to procedure. ASA Grade Assessment: II - A                            patient with mild systemic disease. After reviewing                            the risks and benefits, the patient was deemed in                            satisfactory condition to undergo the procedure.                           After obtaining informed consent, the colonoscope  was passed under direct vision. Throughout the                            procedure, the patient's blood pressure, pulse, and                            oxygen saturations were monitored continuously. The                            CF [id number] #[id number] was introduced through the anus                            and advanced to the the cecum, identified by                             appendiceal orifice and ileocecal valve. The                            ileocecal valve, appendiceal orifice, and rectum                            were photographed. The quality of the bowel                            preparation was good. The colonoscopy was performed                            without difficulty. The patient tolerated the                            procedure well. Scope In: 3:20:11 PM Scope Out: 3:37:55 PM Scope Withdrawal Time: 0 hours 13 minutes 53 seconds  Total Procedure Duration: 0 hours 17 minutes 44 seconds  Findings:                 The perianal and digital rectal examinations were                            normal.                           A 2 mm polyp was found in the ascending colon. The                            polyp was sessile. The polyp was removed with a                            cold biopsy forceps. Resection and retrieval were                            complete.                           Three sessile polyps were found in the transverse  colon (1) and ileocecal valve (2). The polyps were                            5 to 6 mm in size. These polyps were removed with a                            cold snare. Resection and retrieval were complete.                           Multiple medium-mouthed and small-mouthed                            diverticula were found in the left colon. There was                            evidence of diverticular spasm. There was no                            evidence of diverticular bleeding.                           Internal hemorrhoids were found during                            retroflexion. The hemorrhoids were moderate and                            Grade I (internal hemorrhoids that do not prolapse).                           The exam was otherwise without abnormality on                            direct and retroflexion views. Complications:            No immediate complications.  Estimated blood loss:                            None. Estimated Blood Loss:     Estimated blood loss: none. Impression:               - One 2 mm polyp in the ascending colon, removed                            with a cold biopsy forceps. Resected and retrieved.                           - Three 5 to 6 mm polyps in the transverse colon                            and at the ileocecal valve, removed with a cold                            snare. Resected and retrieved.                           -  Moderate diverticulosis in the left colon.                           - Internal hemorrhoids.                           - The examination was otherwise normal on direct                            and retroflexion views. Recommendation:           - Repeat colonoscopy vs no repeat due to age after                            studies are complete for surveillance based on                            pathology results.                           - Resume Plavix (clopidogrel) tomorrow at prior                            dose. Refer to managing physician for further                            adjustment of therapy.                           - Patient has a contact number available for                            emergencies. The signs and symptoms of potential                            delayed complications were discussed with the                            patient. Return to normal activities tomorrow.                            Written discharge instructions were provided to the                            patient.                           - High fiber diet.                           - Continue present medications.                           - Await pathology results. Meryl Dare, MD 07/03/2023 3:41:59 PM This report has been signed electronically.

## 2023-07-03 NOTE — Progress Notes (Signed)
A and O x3. Report to RN. Tolerated MAC anesthesia well. 

## 2023-07-03 NOTE — Progress Notes (Signed)
See 06/26/2023 H&P no changes

## 2023-07-03 NOTE — Patient Instructions (Signed)
Discharge instructions given. Handouts on polyps,diverticulosis and hemorrhoids. Resume Plavix tomorrow at prior dose. Resume previous medications. YOU HAD AN ENDOSCOPIC PROCEDURE TODAY AT THE [facility]:   Refer to the procedure report that was given to you for any specific questions about what was found during the examination.  If the procedure report does not answer your questions, please call your gastroenterologist to clarify.  If you requested that your care partner not be given the details of your procedure findings, then the procedure report has been included in a sealed envelope for you to review at your convenience later.  YOU SHOULD EXPECT: Some feelings of bloating in the abdomen. Passage of more gas than usual.  Walking can help get rid of the air that was put into your GI tract during the procedure and reduce the bloating. If you had a lower endoscopy (such as a colonoscopy or flexible sigmoidoscopy) you may notice spotting of blood in your stool or on the toilet paper. If you underwent a bowel prep for your procedure, you may not have a normal bowel movement for a few days.  Please Note:  You might notice some irritation and congestion in your nose or some drainage.  This is from the oxygen used during your procedure.  There is no need for concern and it should clear up in a day or so.  SYMPTOMS TO REPORT IMMEDIATELY:  Following lower endoscopy (colonoscopy or flexible sigmoidoscopy):  Excessive amounts of blood in the stool  Significant tenderness or worsening of abdominal pains  Swelling of the abdomen that is new, acute  Fever of 100F or higher   For urgent or emergent issues, a gastroenterologist can be reached at any hour by calling [phone]. Do not use MyChart messaging for urgent concerns.    DIET:  We do recommend a small meal at first, but then you may proceed to your regular diet.  Drink plenty of fluids but you should avoid alcoholic beverages for  24 hours.  ACTIVITY:  You should plan to take it easy for the rest of today and you should NOT DRIVE or use heavy machinery until tomorrow (because of the sedation medicines used during the test).    FOLLOW UP: Our staff will call the number listed on your records the next business day following your procedure.  We will call around 7:15- 8:00 am to check on you and address any questions or concerns that you may have regarding the information given to you following your procedure. If we do not reach you, we will leave a message.     If any biopsies were taken you will be contacted by phone or by letter within the next 1-3 weeks.  Please call us at [phone] if you have not heard about the biopsies in 3 weeks.    SIGNATURES/CONFIDENTIALITY: You and/or your care partner have signed paperwork which will be entered into your electronic medical record.  These signatures attest to the fact that that the information above on your After Visit Summary has been reviewed and is understood.  Full responsibility of the confidentiality of this discharge information lies with you and/or your care-partner.

## 2023-07-03 NOTE — Progress Notes (Signed)
Pt's states no medical or surgical changes since previsit or office visit. 

## 2023-07-04 ENCOUNTER — Telehealth: Payer: Self-pay | Admitting: *Deleted

## 2023-07-04 NOTE — Telephone Encounter (Signed)
  Follow up Call-     07/03/2023    2:20 PM  Call back number  Post procedure Call Back phone  # [phone]  Permission to leave phone message Yes     Patient questions:  Do you have a fever, pain , or abdominal swelling? No. Pain Score  0 *  Have you tolerated food without any problems? Yes.    Have you been able to return to your normal activities? Yes.    Do you have any questions about your discharge instructions: Diet   No. Medications  No. Follow up visit  No.  Do you have questions or concerns about your Care? No.  Actions: * If pain score is 4 or above: No action needed, pain <4.

## 2023-07-09 ENCOUNTER — Ambulatory Visit (HOSPITAL_COMMUNITY)
Admission: RE | Admit: 2023-07-09 | Discharge: 2023-07-09 | Disposition: A | Discharge status: Home / Self Care | Payer: Medicare PPO | Source: Ambulatory Visit | Attending: Gastroenterology | Admitting: Gastroenterology

## 2023-07-09 ENCOUNTER — Other Ambulatory Visit: Payer: Self-pay | Admitting: Gastroenterology

## 2023-07-09 DIAGNOSIS — K769 Liver disease, unspecified: Secondary | ICD-10-CM | POA: Diagnosis present

## 2023-07-09 MED ORDER — GADOBUTROL 1 MMOL/ML IV SOLN
8.0000 mL | Freq: Once | INTRAVENOUS | Status: AC | PRN
  Administered 2023-07-09: 8 mL via INTRAVENOUS

## 2023-07-16 ENCOUNTER — Encounter: Payer: Self-pay | Admitting: Gastroenterology

## 2023-07-25 ENCOUNTER — Telehealth: Payer: Self-pay | Admitting: Internal Medicine

## 2023-07-25 NOTE — Telephone Encounter (Signed)
Called patient regarding August appointments, patient is notified.  

## 2023-08-07 ENCOUNTER — Other Ambulatory Visit: Payer: Self-pay

## 2023-08-07 ENCOUNTER — Inpatient Hospital Stay: Payer: Medicare PPO | Attending: Internal Medicine

## 2023-08-07 DIAGNOSIS — Z85528 Personal history of other malignant neoplasm of kidney: Secondary | ICD-10-CM | POA: Insufficient documentation

## 2023-08-07 DIAGNOSIS — Z7902 Long term (current) use of antithrombotics/antiplatelets: Secondary | ICD-10-CM | POA: Diagnosis not present

## 2023-08-07 DIAGNOSIS — Z9089 Acquired absence of other organs: Secondary | ICD-10-CM | POA: Insufficient documentation

## 2023-08-07 DIAGNOSIS — C9110 Chronic lymphocytic leukemia of B-cell type not having achieved remission: Secondary | ICD-10-CM | POA: Insufficient documentation

## 2023-08-07 DIAGNOSIS — Z79899 Other long term (current) drug therapy: Secondary | ICD-10-CM | POA: Diagnosis not present

## 2023-08-07 DIAGNOSIS — Z9049 Acquired absence of other specified parts of digestive tract: Secondary | ICD-10-CM | POA: Insufficient documentation

## 2023-08-07 DIAGNOSIS — Z885 Allergy status to narcotic agent status: Secondary | ICD-10-CM | POA: Insufficient documentation

## 2023-08-07 DIAGNOSIS — I2510 Atherosclerotic heart disease of native coronary artery without angina pectoris: Secondary | ICD-10-CM | POA: Diagnosis not present

## 2023-08-07 DIAGNOSIS — I700 Atherosclerosis of aorta: Secondary | ICD-10-CM | POA: Diagnosis not present

## 2023-08-07 DIAGNOSIS — R319 Hematuria, unspecified: Secondary | ICD-10-CM | POA: Diagnosis not present

## 2023-08-07 DIAGNOSIS — Z8719 Personal history of other diseases of the digestive system: Secondary | ICD-10-CM | POA: Insufficient documentation

## 2023-08-07 DIAGNOSIS — Z882 Allergy status to sulfonamides status: Secondary | ICD-10-CM | POA: Insufficient documentation

## 2023-08-07 DIAGNOSIS — C3490 Malignant neoplasm of unspecified part of unspecified bronchus or lung: Secondary | ICD-10-CM

## 2023-08-07 DIAGNOSIS — C3412 Malignant neoplasm of upper lobe, left bronchus or lung: Secondary | ICD-10-CM | POA: Insufficient documentation

## 2023-08-07 DIAGNOSIS — R161 Splenomegaly, not elsewhere classified: Secondary | ICD-10-CM | POA: Insufficient documentation

## 2023-08-07 LAB — CBC WITH DIFFERENTIAL (CANCER CENTER ONLY)
Abs Immature Granulocytes: 0.01 10*3/uL (ref 0.00–0.07)
Basophils Absolute: 0 10*3/uL (ref 0.0–0.1)
Basophils Relative: 0 %
Eosinophils Absolute: 0.2 10*3/uL (ref 0.0–0.5)
Eosinophils Relative: 1 %
HCT: 37.5 % — ABNORMAL LOW (ref 39.0–52.0)
Hemoglobin: 12.5 g/dL — ABNORMAL LOW (ref 13.0–17.0)
Immature Granulocytes: 0 %
Lymphocytes Relative: 74 %
Lymphs Abs: 9.9 10*3/uL — ABNORMAL HIGH (ref 0.7–4.0)
MCH: 28.5 pg (ref 26.0–34.0)
MCHC: 33.3 g/dL (ref 30.0–36.0)
MCV: 85.6 fL (ref 80.0–100.0)
Monocytes Absolute: 0.3 10*3/uL (ref 0.1–1.0)
Monocytes Relative: 3 %
Neutro Abs: 3 10*3/uL (ref 1.7–7.7)
Neutrophils Relative %: 22 %
Platelet Count: 146 10*3/uL — ABNORMAL LOW (ref 150–400)
RBC: 4.38 MIL/uL (ref 4.22–5.81)
RDW: 14.8 % (ref 11.5–15.5)
Smear Review: NORMAL
WBC Count: 13.4 10*3/uL — ABNORMAL HIGH (ref 4.0–10.5)
nRBC: 0 % (ref 0.0–0.2)

## 2023-08-07 LAB — CMP (CANCER CENTER ONLY)
ALT: 21 U/L (ref 0–44)
AST: 23 U/L (ref 15–41)
Albumin: 4 g/dL (ref 3.5–5.0)
Alkaline Phosphatase: 132 U/L — ABNORMAL HIGH (ref 38–126)
Anion gap: 6 (ref 5–15)
BUN: 34 mg/dL — ABNORMAL HIGH (ref 8–23)
CO2: 25 mmol/L (ref 22–32)
Calcium: 9.6 mg/dL (ref 8.9–10.3)
Chloride: 109 mmol/L (ref 98–111)
Creatinine: 1.78 mg/dL — ABNORMAL HIGH (ref 0.61–1.24)
GFR, Estimated: 39 mL/min — ABNORMAL LOW (ref >60)
Glucose, Bld: 165 mg/dL — ABNORMAL HIGH (ref 70–99)
Potassium: 4 mmol/L (ref 3.5–5.1)
Sodium: 140 mmol/L (ref 135–145)
Total Bilirubin: 0.4 mg/dL (ref 0.3–1.2)
Total Protein: 6.5 g/dL (ref 6.5–8.1)

## 2023-08-07 LAB — LACTATE DEHYDROGENASE: LDH: 119 U/L (ref 98–192)

## 2023-08-08 ENCOUNTER — Ambulatory Visit (HOSPITAL_COMMUNITY)
Admission: RE | Admit: 2023-08-08 | Discharge: 2023-08-08 | Disposition: A | Discharge status: Home / Self Care | Payer: Medicare PPO | Source: Ambulatory Visit | Attending: Internal Medicine | Admitting: Internal Medicine

## 2023-08-08 DIAGNOSIS — C3490 Malignant neoplasm of unspecified part of unspecified bronchus or lung: Secondary | ICD-10-CM | POA: Insufficient documentation

## 2023-08-09 ENCOUNTER — Ambulatory Visit: Payer: Medicare PPO | Admitting: Internal Medicine

## 2023-08-10 ENCOUNTER — Encounter: Payer: Self-pay | Admitting: Cardiovascular Disease

## 2023-08-10 NOTE — Telephone Encounter (Signed)
Pt sent update in separate MyChart message:  Per your request this is a follow up to indicate the response I got from [facility]/ Dr. Mena Goes.  To preface this I also called my general practice doctor's office Dr. Dorinda Hill and let her know of my condition, urinating blood. I also talked to the nurse at [facility] at about 8am today. The nurse at [facility] whose name is Vernona Rieger said she would message Dr. Mena Goes about my urinating blood and get back to me as to if I should stop Plavix. I got a call at 11:30am today from Dr. Sigurd Sos nurse Brayton Caves who informed me not to take Plavix and that she had made an appointment for me with Peyton Najjar, PA at [facility] for 3:15pm on Monday Aug. 19, 2024.   I have not seen Dr. Mena Goes in over a year.  I have seen Peyton Najjar for urinary urgency etc multiple times over the last several months.  I have an appointment with Dr. Mena Goes for August 16, 2023 and called today and confirmed that appointment still stands.  I will go to both appointments, that is, if Dr. Mena Goes does not cancel his appointment with me as he did last time. Thank you Bradley Irwin   __________________________________________________  Per Dr. Clifton James - agree w holding Plavix.

## 2023-08-10 NOTE — Telephone Encounter (Signed)
Noting blood in urine w clots in the morning when waking up.  He has called the urologist at [facility] and told PCP.  The hematuria started on 08/07/23.  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  I adv him to hold Plavix today and until he is not urinating clots and to let us know what urology plan is.

## 2023-08-13 ENCOUNTER — Encounter: Payer: Self-pay | Admitting: Cardiovascular Disease

## 2023-08-13 NOTE — Telephone Encounter (Signed)
Called Dr. Mena Goes office at [facility] and left message that the patient needs to know if he is okay to restart Plavix or if he needs to hold it for his upcoming cystoscopy on 08/16/23.  Will also route to PharmD in case they have any other recommendations.

## 2023-08-14 ENCOUNTER — Other Ambulatory Visit: Payer: Self-pay | Admitting: Medical Oncology

## 2023-08-14 ENCOUNTER — Inpatient Hospital Stay: Payer: Medicare PPO | Admitting: Internal Medicine

## 2023-08-14 ENCOUNTER — Telehealth: Payer: Self-pay | Admitting: Cardiovascular Disease

## 2023-08-14 VITALS — BP 91/53 | HR 76 | Temp 98.2°F | Resp 16 | Ht 65.0 in | Wt 184.0 lb

## 2023-08-14 DIAGNOSIS — C3412 Malignant neoplasm of upper lobe, left bronchus or lung: Secondary | ICD-10-CM | POA: Diagnosis not present

## 2023-08-14 DIAGNOSIS — C3490 Malignant neoplasm of unspecified part of unspecified bronchus or lung: Secondary | ICD-10-CM | POA: Diagnosis not present

## 2023-08-14 NOTE — Telephone Encounter (Signed)
Lauren from [facility] is calling to verify if the patient can hold his Plavix.

## 2023-08-14 NOTE — Progress Notes (Signed)
[facility] [phone]   Fax:([phone]) [phone]  OFFICE PROGRESS NOTE  Bradley Quitter, MD [address] 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  PRIOR THERAPY: Status post left upper lobe wedge resection with lymph node sampling.  CURRENT THERAPY: Observation.  INTERVAL HISTORY: Bradley Irwin 77 y.o. male returns to the clinic today for follow-up visit.  The patient is feeling fine today with no concerning complaints except for recent hematuria and he is seen by urology.  He denied having any current chest pain, shortness of breath, cough or hemoptysis.  He has no nausea, vomiting, diarrhea or constipation.  He has no headache or visual changes.  He has no recent weight loss or night sweats.  He is here today for evaluation with repeat CT scan of the chest for restaging of his disease.  MEDICAL HISTORY: Past Medical History:  Diagnosis Date   Anxiety    Atypical mole 06/08/2010   Right Abdomen(moderate) Nino Glow)   Atypical mole 04/25/1995   Left Upper ShoulderBlade(slight)   Atypical mole 12/31/2013   Left Mid Back(Moderate) Nino Glow)   Colon polyps    Coronary artery disease    Eczema    GERD (gastroesophageal reflux disease)    Gout    Hearing loss    Hx of cardiovascular stress test    Lexiscan Myoview 5/16:  The study is normal. No ischemia identified. This is a low risk study. Overall left ventricular systolic function was normal. LV cavity size is normal. The left ventricular ejection fraction is normal (55-65%). Sensitivity reduced by bowel loop attenuation artifact.   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  MEDICATIONS:  Current Outpatient Medications  Medication Sig Dispense Refill   acetaminophen (TYLENOL) 500 MG tablet Take 500 mg by mouth every 6 (six) hours as needed.     alfuzosin (UROXATRAL) 10 MG 24 hr tablet Take 10 mg by mouth daily.     allopurinol (ZYLOPRIM) 100 MG tablet Take 100 mg by mouth daily.     ALPRAZolam (XANAX) 0.5 MG tablet Take 0.5 mg by mouth 2 (two) times daily as needed for anxiety.      amLODipine (NORVASC) 10 MG tablet Take 10 mg by mouth daily. Taking (1/2 tab) 5mg  daily     aspirin EC 81 MG tablet Take 81 mg by mouth daily. Swallow whole.     busPIRone (BUSPAR) 7.5 MG tablet Take 7.5 mg by mouth as needed.     clidinium-chlordiazePOXIDE (LIBRAX) 5-2.5 MG capsule Take 2 capsules by mouth daily as needed. Takes for  IBS     clopidogrel (PLAVIX) 75 MG tablet TAKE 1 TABLET BY MOUTH EVERY DAY 90 tablet 3   Colchicine 0.6 MG CAPS Take  1 tablet by mouth as needed.     Cranberry-Vitamin C-Probiotic (AZO CRANBERRY PO) Take 1 tablet by mouth as needed.     diclofenac Sodium (VOLTAREN) 1 % GEL as directed Externally     dicyclomine (BENTYL) 20 MG tablet      diphenhydrAMINE (BENADRYL) 25 MG tablet Take 25 mg by mouth at bedtime.     Docusate Sodium (DSS) 100 MG CAPS      doxycycline (VIBRA-TABS) 100 MG tablet Take 100 mg by mouth 2 (two) times daily.     ferrous sulfate 325 (65 FE) MG tablet Take 1 tablet by mouth daily.     fluticasone (FLONASE) 50 MCG/ACT nasal spray Place 1 spray into both nostrils daily.     hydrOXYzine (ATARAX) 25 MG tablet Take 25 mg by mouth as needed.     levothyroxine (SYNTHROID) 150 MCG tablet Take 150 mcg by mouth daily before breakfast.     Methenamine-Sodium Salicylate  (AZO URINARY TRACT DEFENSE) 162-162.5 MG TABS Take 1 tablet by mouth as needed.     nitroGLYCERIN (NITROSTAT) 0.4 MG SL tablet Place 0.4 mg under the tongue every 5 (five) minutes as needed for chest pain.     olmesartan (BENICAR) 5 MG tablet Take 2 tablets by mouth daily.     omeprazole (PRILOSEC) 20 MG capsule Take 20 mg by mouth 2 (two) times daily.      rosuvastatin (CRESTOR) 10 MG tablet Take 10 mg by mouth daily.     simethicone (MYLICON) 125 MG chewable tablet Chew 125 mg by mouth every 6 (six) hours as needed for flatulence.     valACYclovir (VALTREX) 500 MG tablet Take 500 mg by mouth 2 (two) times daily as needed (fever blisters).  0   Vibegron (GEMTESA) 75 MG TABS Take 1 tablet by mouth daily.     No current facility-administered medications for this visit.    SURGICAL HISTORY:  Past Surgical History:  Procedure Laterality Date   CARDIAC CATHETERIZATION     CHOLECYSTECTOMY  10/26/2011   COLONOSCOPY     CORONARY STENT PLACEMENT  2006   FINGER SURGERY Right 77 y/o   cut index rt   INTERCOSTAL NERVE BLOCK  06/16/2019   Procedure: Intercostal Nerve Block;  Surgeon: Delight Ovens, MD;  Location: [facility] OR;  Service: Thoracic;;   LIPOMA EXCISION Left 11/18/2013   Procedure: EXCISION POSTERIOR NECK LIPOMA ;  Surgeon: Almond Lint, MD;  Location: [facility];  Service: General;  Laterality: Left;   LIVER BIOPSY  09/18/2011   LIVER LOBECTOMY  10/26/2011   LYMPH NODE DISSECTION  06/16/2019   Procedure: Lymph Node Dissection;  Surgeon: Delight Ovens, MD;  Location: [facility] OR;  Service: Thoracic;;   NEPHRECTOMY Right 2008   right   TONSILLECTOMY     VIDEO ASSISTED THORACOSCOPY (VATS)/WEDGE RESECTION Left 06/16/2019   Procedure: VIDEO ASSISTED THORACOSCOPY (VATS)/MINI THOROCOTOMY WITH LEFT UPPER LOBE WEDGE RESECTION;  Surgeon: Delight Ovens, MD;  Location: [facility] OR;  Service: Thoracic;  Laterality: Left;   VIDEO BRONCHOSCOPY N/A 06/16/2019   Procedure: VIDEO  BRONCHOSCOPY;  Surgeon: Delight Ovens, MD;  Location: [facility] OR;  Service: Thoracic;  Laterality: N/A;    REVIEW OF SYSTEMS:  A comprehensive review of systems was negative except for: Respiratory: positive for dyspnea on exertion Genitourinary: positive for hematuria   PHYSICAL EXAMINATION: General appearance: alert, cooperative, and no distress Head: Normocephalic, without obvious abnormality, atraumatic Neck: no adenopathy, no JVD, supple, symmetrical, trachea midline, and thyroid  not enlarged, symmetric, no tenderness/mass/nodules Lymph nodes: Cervical, supraclavicular, and axillary nodes normal. Resp: clear to auscultation bilaterally Back: symmetric, no curvature. ROM normal. No CVA tenderness. Cardio: regular rate and rhythm, S1, S2 normal, no murmur, click, rub or gallop GI: soft, non-tender; bowel sounds normal; no masses,  no organomegaly Extremities: extremities normal, atraumatic, no cyanosis or edema  ECOG PERFORMANCE STATUS: 1 - Symptomatic but completely ambulatory  Blood pressure (!) 91/53, pulse 76, temperature 98.2 F (36.8 C), resp. rate 16, height 5\' 5"  (1.651 m), weight 184 lb (83.5 kg), SpO2 99%.  LABORATORY DATA: Lab Results  Component Value Date   WBC 13.4 (H) 08/07/2023   HGB 12.5 (L) 08/07/2023   HCT 37.5 (L) 08/07/2023   MCV 85.6 08/07/2023   PLT 146 (L) 08/07/2023      Chemistry      Component Value Date/Time   NA 140 08/07/2023 0941   K 4.0 08/07/2023 0941   CL 109 08/07/2023 0941   CO2 25 08/07/2023 0941   BUN 34 (H) 08/07/2023 0941   CREATININE 1.78 (H) 08/07/2023 0941   CREATININE 1.30 (H) 02/06/2016 1714      Component Value Date/Time   CALCIUM 9.6 08/07/2023 0941   ALKPHOS 132 (H) 08/07/2023 0941   AST 23 08/07/2023 0941   ALT 21 08/07/2023 0941   BILITOT 0.4 08/07/2023 0941       RADIOGRAPHIC STUDIES: CT Chest Wo Contrast  Result Date: 08/13/2023 CLINICAL DATA:  Non-small cell lung cancer staging. Additional history of small  lymphocytic lymphoma. * Tracking Code: BO * EXAM: CT CHEST WITHOUT CONTRAST TECHNIQUE: Multidetector CT imaging of the chest was performed following the standard protocol without IV contrast. RADIATION DOSE REDUCTION: This exam was performed according to the departmental dose-optimization program which includes automated exposure control, adjustment of the mA and/or kV according to patient size and/or use of iterative reconstruction technique. COMPARISON:  None Available. FINDINGS: Cardiovascular: Coronary artery calcification and aortic atherosclerotic calcification. Mediastinum/Nodes: Again demonstrated bilateral prominent axillary lymph nodes not changed in number size compared to prior for and example node in the LEFT axilla measures 15 mm (image 27/2) compared to 16 mm. A small mediastinal lymph nodes are unchanged. Largest subcarinal node measures 13 mm (image 81) and unchanged. Lungs/Pleura: Postsurgical change in the LEFT upper lobe again noted with than linear thickening along the resection margin. Along the inferior margin of the postsurgical change there is a 15 mm x 11 mm focus of nodular thickening (image 66/8) which is more prominent than prior. Ground-glass nodule in the RIGHT upper lobe is stable measuring 16 mm on image 37/8) Upper Abdomen: limited view of the upper abdomen demonstrate no upper abdominal adenopathy. Low-density lesion in the spleen is unchanged. The spleen is enlarged at 14 cm not changed from prior. Adrenal glands normal. Musculoskeletal: No aggressive osseous lesion. IMPRESSION: 1. nodular thickening along the inferior margin of the LEFT upper lobe surgical resection is more prominent than prior. Recommend FDG PET scan versus interval short-term surveillance (3 to 6 months). 2. Stable ground-glass nodule in the RIGHT upper lobe. 3. 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 4. Stable splenomegaly and low-density lesion in the spleen. Electronically  Signed   By: Genevive Bi M.D.   On: 08/13/2023 12:28     ASSESSMENT AND PLAN: This is a very pleasant 77 years old white male with stage Ia non-small cell lung cancer, adenocarcinoma with positive MET 14 splice mutation status post wedge resection of the  left upper lobe with lymph node sampling in June 2020.  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 The patient is currently on observation and he is feeling fine with no concerning complaints except for the recent hematuria followed by urology. He had repeat CT scan of the chest performed recently.  I personally and independently reviewed the scan images and discussed the results with the patient today. His scan showed no concerning findings for disease recurrence or metastasis but there was nodular thickening along the inferior margin of the left upper lobe surgical resection that is more prominent than before. I recommended for the patient to have repeat CT scan of the chest in 3 months for further evaluation of this area and if concerning on the upcoming scan, I may consider him for a PET scan followed by biopsy. The patient was advised to call immediately if he has any other concerning symptoms in the interval. The patient voices understanding of current disease status and treatment options and is in agreement with the current care plan.  All questions were answered. The patient knows to call the clinic with any problems, questions or concerns. We can certainly see the patient much sooner if necessary.  Disclaimer: This note was dictated with voice recognition software. Similar sounding words can inadvertently be transcribed and may not be corrected upon review.

## 2023-08-16 NOTE — Telephone Encounter (Signed)
Called and spoke w Leotis Shames at [facility].  She has the patient there at this time.  Pt had cysto this am and had stayed off Plavix up to this point.  She said they are sending off cytology because there was bleeding in the bladder.  She wanted to know if he should remain off Plavix.   I adv that we would want pt to resume Plavix when Dr. Mena Goes feels it is safe for him to do so.

## 2023-08-24 ENCOUNTER — Telehealth: Payer: Self-pay | Admitting: *Deleted

## 2023-08-24 ENCOUNTER — Other Ambulatory Visit: Payer: Self-pay | Admitting: Urology

## 2023-08-24 ENCOUNTER — Telehealth: Payer: Self-pay | Admitting: Cardiovascular Disease

## 2023-08-24 NOTE — Telephone Encounter (Signed)
I s/w the pt and he has been scheduled a tele pre op appt 09/14/23 @ 1:40. Med rec and consent are done. In reviewing medications pt tells me that hi BP has been running low and his PCP stopped his BP meds, as well he states PCP stopped Plavix due to hematuria. Pt asked if Dr. Clifton James will be ok stopping the Plavix. I assured the pt that I will let Dr. Clifton James know of meds that have been stopped. Pt thanked me for the help and the call.   I will update all parties involved.

## 2023-08-24 NOTE — Telephone Encounter (Signed)
   Pre-operative Risk Assessment    Patient Name: Bradley Irwin  DOB: 04/17/1946 MRN: [id number]      Request for Surgical Clearance    Procedure:   Bladder biopsy  Date of Surgery:  Clearance 10/02/23                                 Surgeon:  Dr. Mena Goes Surgeon's Group or Practice Name:  [facility] Phone number:  [phone]  Fax number:  [phone]   Type of Clearance Requested:   - Medical  - Pharmacy:  Hold Aspirin and Clopidogrel (Plavix)     Type of Anesthesia:  General    Additional requests/questions:   Caller Elita Quick) states they will need to hold patient's medication for 5 days.    Signed, Annetta Maw   08/24/2023, 1:08 PM

## 2023-08-24 NOTE — Telephone Encounter (Signed)
  Patient Consent for Virtual Visit        Bradley Irwin has provided verbal consent on 08/24/2023 for a virtual visit (video or telephone).   CONSENT FOR VIRTUAL VISIT FOR:  Bradley Irwin  By participating in this virtual visit I agree to the following:  I hereby voluntarily request, consent and authorize [facility] and its employed or contracted physicians, physician assistants, nurse practitioners or other licensed health care professionals (the Practitioner), to provide me with telemedicine health care services (the "Services") as deemed necessary by the treating Practitioner. I acknowledge and consent to receive the Services by the Practitioner via telemedicine. I understand that the telemedicine visit will involve communicating with the Practitioner through live audiovisual communication technology and the disclosure of certain medical information by electronic transmission. I acknowledge that I have been given the opportunity to request an in-person assessment or other available alternative prior to the telemedicine visit and am voluntarily participating in the telemedicine visit.  I understand that I have the right to withhold or withdraw my consent to the use of telemedicine in the course of my care at any time, without affecting my right to future care or treatment, and that the Practitioner or I may terminate the telemedicine visit at any time. I understand that I have the right to inspect all information obtained and/or recorded in the course of the telemedicine visit and may receive copies of available information for a reasonable fee.  I understand that some of the potential risks of receiving the Services via telemedicine include:  Delay or interruption in medical evaluation due to technological equipment failure or disruption; Information transmitted may not be sufficient (e.g. poor resolution of images) to allow for appropriate medical decision making by the  Practitioner; and/or  In rare instances, security protocols could fail, causing a breach of personal health information.  Furthermore, I acknowledge that it is my responsibility to provide information about my medical history, conditions and care that is complete and accurate to the best of my ability. I acknowledge that Practitioner's advice, recommendations, and/or decision may be based on factors not within their control, such as incomplete or inaccurate data provided by me or distortions of diagnostic images or specimens that may result from electronic transmissions. I understand that the practice of medicine is not an exact science and that Practitioner makes no warranties or guarantees regarding treatment outcomes. I acknowledge that a copy of this consent can be made available to me via my patient portal [facility] MyChart), or I can request a printed copy by calling the office of [facility].    I understand that my insurance will be billed for this visit.   I have read or had this consent read to me. I understand the contents of this consent, which adequately explains the benefits and risks of the Services being provided via telemedicine.  I have been provided ample opportunity to ask questions regarding this consent and the Services and have had my questions answered to my satisfaction. I give my informed consent for the services to be provided through the use of telemedicine in my medical care

## 2023-08-24 NOTE — Telephone Encounter (Signed)
   Name: Bradley Irwin  DOB: Dec 29, 1945  MRN: [id number]  Primary Cardiologist: Verne Carrow, MD   Preoperative team, please contact this patient and set up a phone call appointment for further preoperative risk assessment. Please obtain consent and complete medication review. Thank you for your help.  I confirm that guidance regarding antiplatelet and oral anticoagulation therapy has been completed and, if necessary, noted below.  Per office protocol, if patient is without any new symptoms or concerns at the time of their virtual visit, he may hold Plavix for 5 days prior to procedure. Please resume Plavix as soon as possible postprocedure, at the discretion of the surgeon.     Joylene Grapes, NP 08/24/2023, 3:51 PM [facility]

## 2023-09-07 NOTE — Patient Instructions (Signed)
DUE TO COVID-19 ONLY TWO VISITORS  (aged 76 and older)  ARE ALLOWED TO COME WITH YOU AND STAY IN THE WAITING ROOM ONLY DURING PRE OP AND PROCEDURE.   **NO VISITORS ARE ALLOWED IN THE SHORT STAY AREA OR RECOVERY ROOM!!**  IF YOU WILL BE ADMITTED INTO THE HOSPITAL YOU ARE ALLOWED ONLY FOUR SUPPORT PEOPLE DURING VISITATION HOURS ONLY (7 AM -8PM)   The support person(s) must pass our screening, gel in and out, and wear a mask at all times, including in the patient's room. Patients must also wear a mask when staff or their support person are in the room. Visitors GUEST BADGE MUST BE WORN VISIBLY  One adult visitor may remain with you overnight and MUST be in the room by 8 P.M.     Your procedure is scheduled on: 10/02/23   Report to [facility] Main Entrance    Report to admitting at : 5:15 AM   Call this number if you have problems the morning of surgery [phone]   Do not eat food or drink :After Midnight.  FOLLOW ANY ADDITIONAL PRE OP INSTRUCTIONS YOU RECEIVED FROM YOUR SURGEON'S OFFICE!!!   Oral Hygiene is also important to reduce your risk of infection.                                    Remember - BRUSH YOUR TEETH THE MORNING OF SURGERY WITH YOUR REGULAR TOOTHPASTE  DENTURES WILL BE REMOVED PRIOR TO SURGERY PLEASE DO NOT APPLY "Poly grip" OR ADHESIVES!!!   Do NOT smoke after Midnight   Take these medicines the morning of surgery with A SIP OF WATER: allopurinol,vibegron,alfuzosin,levothyroxine,omeprazole.Tylenol,alprazolam,hydroxyzine,colchicine as needed.                              You may not have any metal on your body including hair pins, jewelry, and body piercing             Do not wear lotions, powders, perfumes/cologne, or deodorant              Men may shave face and neck.   Do not bring valuables to the hospital.  IS NOT             RESPONSIBLE   FOR VALUABLES.   Contacts, glasses, or bridgework may not be worn into surgery.   Bring small  overnight bag day of surgery.   DO NOT BRING YOUR HOME MEDICATIONS TO THE HOSPITAL. PHARMACY WILL DISPENSE MEDICATIONS LISTED ON YOUR MEDICATION LIST TO YOU DURING YOUR ADMISSION IN THE HOSPITAL!    Patients discharged on the day of surgery will not be allowed to drive home.  Someone NEEDS to stay with you for the first 24 hours after anesthesia.   Special Instructions: Bring a copy of your healthcare power of attorney and living will documents         the day of surgery if you haven't scanned them before.              Please read over the following fact sheets you were given: IF YOU HAVE QUESTIONS ABOUT YOUR PRE-OP INSTRUCTIONS PLEASE CALL [phone]    [facility] - Preparing for Surgery Before surgery, you can play an important role.  Because skin is not sterile, your skin needs to be as free of germs as possible.  You  can reduce the number of germs on your skin by washing with CHG (chlorahexidine gluconate) soap before surgery.  CHG is an antiseptic cleaner which kills germs and bonds with the skin to continue killing germs even after washing. Please DO NOT use if you have an allergy to CHG or antibacterial soaps.  If your skin becomes reddened/irritated stop using the CHG and inform your nurse when you arrive at Short Stay. Do not shave (including legs and underarms) for at least 48 hours prior to the first CHG shower.  You may shave your face/neck. Please follow these instructions carefully:  1.  Shower with CHG Soap the night before surgery and the  morning of Surgery.  2.  If you choose to wash your hair, wash your hair first as usual with your  normal  shampoo.  3.  After you shampoo, rinse your hair and body thoroughly to remove the  shampoo.                           4.  Use CHG as you would any other liquid soap.  You can apply chg directly  to the skin and wash                       Gently with a scrungie or clean washcloth.  5.  Apply the CHG Soap to your body ONLY FROM THE NECK  DOWN.   Do not use on face/ open                           Wound or open sores. Avoid contact with eyes, ears mouth and genitals (private parts).                       Wash face,  Genitals (private parts) with your normal soap.             6.  Wash thoroughly, paying special attention to the area where your surgery  will be performed.  7.  Thoroughly rinse your body with warm water from the neck down.  8.  DO NOT shower/wash with your normal soap after using and rinsing off  the CHG Soap.                9.  Pat yourself dry with a clean towel.            10.  Wear clean pajamas.            11.  Place clean sheets on your bed the night of your first shower and do not  sleep with pets. Day of Surgery : Do not apply any lotions/deodorants the morning of surgery.  Please wear clean clothes to the hospital/surgery center.  FAILURE TO FOLLOW THESE INSTRUCTIONS MAY RESULT IN THE CANCELLATION OF YOUR SURGERY PATIENT SIGNATURE_________________________________  NURSE SIGNATURE__________________________________  ________________________________________________________________________

## 2023-09-10 ENCOUNTER — Other Ambulatory Visit: Payer: Self-pay

## 2023-09-10 ENCOUNTER — Encounter (HOSPITAL_COMMUNITY): Payer: Self-pay

## 2023-09-10 ENCOUNTER — Encounter (HOSPITAL_COMMUNITY)
Admission: RE | Admit: 2023-09-10 | Discharge: 2023-09-10 | Disposition: A | Discharge status: Home / Self Care | Payer: Medicare PPO | Source: Ambulatory Visit | Attending: Urology

## 2023-09-10 VITALS — BP 120/57 | HR 77 | Temp 97.6°F | Ht 65.0 in | Wt 181.0 lb

## 2023-09-10 DIAGNOSIS — Z01812 Encounter for preprocedural laboratory examination: Secondary | ICD-10-CM | POA: Insufficient documentation

## 2023-09-10 DIAGNOSIS — I10 Essential (primary) hypertension: Secondary | ICD-10-CM | POA: Diagnosis not present

## 2023-09-10 DIAGNOSIS — Z01818 Encounter for other preprocedural examination: Secondary | ICD-10-CM

## 2023-09-10 LAB — CBC
HCT: 43 % (ref 39.0–52.0)
Hemoglobin: 13.4 g/dL (ref 13.0–17.0)
MCH: 27.5 pg (ref 26.0–34.0)
MCHC: 31.2 g/dL (ref 30.0–36.0)
MCV: 88.3 fL (ref 80.0–100.0)
Platelets: 152 10*3/uL (ref 150–400)
RBC: 4.87 MIL/uL (ref 4.22–5.81)
RDW: 14.6 % (ref 11.5–15.5)
WBC: 13.4 10*3/uL — ABNORMAL HIGH (ref 4.0–10.5)
nRBC: 0 % (ref 0.0–0.2)

## 2023-09-10 LAB — BASIC METABOLIC PANEL
Anion gap: 6 (ref 5–15)
BUN: 29 mg/dL — ABNORMAL HIGH (ref 8–23)
CO2: 22 mmol/L (ref 22–32)
Calcium: 9.5 mg/dL (ref 8.9–10.3)
Chloride: 109 mmol/L (ref 98–111)
Creatinine, Ser: 1.64 mg/dL — ABNORMAL HIGH (ref 0.61–1.24)
GFR, Estimated: 43 mL/min — ABNORMAL LOW (ref >60)
Glucose, Bld: 93 mg/dL (ref 70–99)
Potassium: 4.1 mmol/L (ref 3.5–5.1)
Sodium: 137 mmol/L (ref 135–145)

## 2023-09-10 LAB — SURGICAL PCR SCREEN
MRSA, PCR: NEGATIVE
Staphylococcus aureus: NEGATIVE

## 2023-09-10 NOTE — Progress Notes (Addendum)
For Short Stay: COVID SWAB appointment date:  Bowel Prep reminder:   For Anesthesia: PCP - [name], MD  Cardiologist - Verne Carrow  Clearance: Dutch Gray: NP: 08/24/23 Chest x-ray - CT chest: 08/13/23 EKG - 02/16/23 Stress Test -  ECHO - 01/25/21 Cardiac Cath - 01/2005 Pacemaker/ICD device last checked: Pacemaker orders received: Device Rep notified:  Spinal Cord Stimulator: N/A  Sleep Study - N/A CPAP -   Fasting Blood Sugar - N/A Checks Blood Sugar _____ times a day Date and result of last Hgb A1c-  Last dose of GLP1 agonist-  GLP1 instructions:   Last dose of SGLT-2 inhibitors- N/A SGLT-2 instructions:   Blood Thinner Instructions: Plavix will be hold 5 days before surgery. Aspirin Instructions: Last Dose:  Activity level: Can go up a flight of stairs and activities of daily living without stopping and without chest pain and/or shortness of breath   Able to exercise without chest pain and/or shortness of breath   Unable to go up a flight of stairs without shortness of breath    Anesthesia review: Hx: HTN,PVD,CAD  Patient denies shortness of breath, fever, cough and chest pain at PAT appointment   Patient verbalized understanding of instructions that were given to them at the PAT appointment. Patient was also instructed that they will need to review over the PAT instructions again at home before surgery.

## 2023-09-14 ENCOUNTER — Ambulatory Visit: Payer: Medicare PPO | Attending: Cardiovascular Disease | Admitting: Student

## 2023-09-14 DIAGNOSIS — Z01810 Encounter for preprocedural cardiovascular examination: Secondary | ICD-10-CM

## 2023-09-14 NOTE — Progress Notes (Signed)
Virtual Visit via Telephone Note   Because of Bradley Irwin's co-morbid illnesses, he is at least at moderate risk for complications without adequate follow up.  This format is felt to be most appropriate for this patient at this time.  The patient did not have access to video technology/had technical difficulties with video requiring transitioning to audio format only (telephone).  All issues noted in this document were discussed and addressed.  No physical exam could be performed with this format.  Please refer to the patient's chart for his consent to telehealth for [facility].  Evaluation Performed:  Preoperative cardiovascular risk assessment _____________   Date:  09/14/2023   Patient ID:  Bradley Irwin, DOB 1946/07/17, MRN [id number] Patient Location:  Home Provider location:   Office  Primary Care Provider:  [name], MD Primary Cardiologist:  Verne Carrow, MD  Chief Complaint / Patient Profile   77 y.o. y/o male with a h/o CAD s/p DES to LAD February 2006, PVD, hyperlipidemia, lung cancer s/p resection, renal cell carcinoma s/p nephrectomy, GERD, lymphocytic lymphoma/CLL who is pending cystoscopy with bladder biopsy by Dr. Mena Goes and presents today for telephonic preoperative cardiovascular risk assessment.  History of Present Illness    Bradley Irwin is a 77 y.o. male who presents via audio/video conferencing for a telehealth visit today.  Pt was last seen in cardiology clinic on 02/16/2023 by Dr. Clifton James.  At that time Bradley Irwin was stable from a cardiac standpoint.  The patient is now pending procedure as outlined above. Since his last visit, he is doing well. Patient denies  lower extremity edema, orthopnea or PND. No chest pain, pressure, or tightness. No palpitations. Patient has interstitial lung disease and has shortness of breath at baseline and unchanged.    Past Medical History    Past Medical History:  Diagnosis Date    Anxiety    Atypical mole 06/08/2010   Right Abdomen(moderate) Bradley Irwin)   Atypical mole 04/25/1995   Left Upper ShoulderBlade(slight)   Atypical mole 12/31/2013   Left Mid Back(Moderate) Bradley Irwin)   Colon polyps    Coronary artery disease    Eczema    GERD (gastroesophageal reflux disease)    Gout    Hearing loss    Hx of cardiovascular stress test    Lexiscan Myoview 5/16:  The study is normal. No ischemia identified. This is a low risk study. Overall left ventricular systolic function was normal. LV cavity size is normal. The left ventricular ejection fraction is normal (55-65%). Sensitivity reduced by bowel loop attenuation artifact.   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   Pneumonia    Psoriasis    PVD (peripheral vascular disease) (HCC)    Rash    Renal cell cancer (HCC)    Squamous cell carcinoma in situ    Wears glasses    Wears hearing aid    both ears   Past Surgical History:  Procedure Laterality Date   CARDIAC CATHETERIZATION     CHOLECYSTECTOMY  10/26/2011   COLONOSCOPY     CORONARY STENT PLACEMENT  2006   FINGER SURGERY Right 77 y/o   cut index rt   INTERCOSTAL NERVE BLOCK  06/16/2019   Procedure: Intercostal Nerve Block;  Surgeon: Delight Ovens, MD;  Location: [facility] OR;  Service: Thoracic;;   LIPOMA EXCISION Left 11/18/2013   Procedure: EXCISION POSTERIOR NECK LIPOMA ;  Surgeon: Almond Lint, MD;  Location: [facility];  Service: General;  Laterality: Left;   LIVER BIOPSY  09/18/2011   LIVER LOBECTOMY  10/26/2011   LYMPH NODE DISSECTION  06/16/2019   Procedure: Lymph Node Dissection;  Surgeon: Delight Ovens, MD;  Location: [facility] OR;  Service: Thoracic;;   NEPHRECTOMY Right 2008   right   TONSILLECTOMY     VIDEO ASSISTED THORACOSCOPY (VATS)/WEDGE RESECTION Left 06/16/2019   Procedure:  VIDEO ASSISTED THORACOSCOPY (VATS)/MINI THOROCOTOMY WITH LEFT UPPER LOBE WEDGE RESECTION;  Surgeon: Delight Ovens, MD;  Location: [facility] OR;  Service: Thoracic;  Laterality: Left;   VIDEO BRONCHOSCOPY N/A 06/16/2019   Procedure: VIDEO BRONCHOSCOPY;  Surgeon: Delight Ovens, MD;  Location: [facility] OR;  Service: Thoracic;  Laterality: N/A;    Allergies  Allergies  Allergen Reactions   Sulfa Antibiotics Other (See Comments)    Happened when he was child. Does not remember reaction.   Demerol Other (See Comments)    Severe headache.   Pseudoephedrine Other (See Comments)    Hypertension     Home Medications    Prior to Admission medications   Medication Sig Start Date End Date Taking? Authorizing Provider  acetaminophen (TYLENOL) 500 MG tablet Take 500-1,000 mg by mouth every 6 (six) hours as needed for moderate pain.    [provider]  alfuzosin (UROXATRAL) 10 MG 24 hr tablet Take 10 mg by mouth daily. 08/07/22   [provider]  allopurinol (ZYLOPRIM) 100 MG tablet Take 100 mg by mouth daily.    [provider]  ALPRAZolam Prudy Feeler) 0.5 MG tablet Take 0.5 mg by mouth 2 (two) times daily as needed for anxiety.     [provider]  aspirin EC 81 MG tablet Take 81 mg by mouth daily. Swallow whole.    [provider]  clidinium-chlordiazePOXIDE (LIBRAX) 5-2.5 MG capsule Take 1 capsule by mouth daily as needed (ibs).    [provider]  clopidogrel (PLAVIX) 75 MG tablet TAKE 1 TABLET BY MOUTH EVERY DAY 03/28/23   Kathleene Hazel, MD  Colchicine 0.6 MG CAPS Take 1 tablet by mouth as needed (gout). 04/18/21   [provider]  Cranberry-Vitamin C-Probiotic (AZO CRANBERRY PO) Take 2 tablets by mouth daily.    [provider]  diclofenac Sodium (VOLTAREN) 1 % GEL Apply 1 Application topically 4 (four) times daily as needed (pain). 04/20/21   [provider]  dicyclomine (BENTYL) 20 MG tablet Take 20 mg by mouth  daily as needed for spasms. 01/01/20   [provider]  Docosanol (ABREVA) 10 % CREA Apply 1 Application topically daily as needed (fever blisters).    [provider]  Docusate Sodium (DSS) 100 MG CAPS Take 100 mg by mouth 2 (two) times daily as needed (constipation). 02/10/20   [provider]  ferrous sulfate 325 (65 FE) MG tablet Take 325 mg by mouth daily. 08/01/21   [provider]  fluticasone (FLONASE) 50 MCG/ACT nasal spray Place 1 spray into both nostrils daily.    [provider]  hydrOXYzine (ATARAX) 25 MG tablet Take 25 mg by mouth as needed for itching.    [provider]  levothyroxine (SYNTHROID) 150 MCG tablet Take 150 mcg by mouth daily before breakfast.    [provider]  Methenamine-Sodium Salicylate (AZO URINARY TRACT DEFENSE) 162-162.5 MG TABS Take 1 tablet by  mouth as needed (urinary pain).    [provider]  nitroGLYCERIN (NITROSTAT) 0.4 MG SL tablet Place 0.4 mg under the tongue every 5 (five) minutes as needed for chest pain.    [provider]  omeprazole (PRILOSEC) 20 MG capsule Take 20 mg by mouth 2 (two) times daily.     [provider]  Probiotic Product (PROBIOTIC PO) Take 1 capsule by mouth daily.    [provider]  rosuvastatin (CRESTOR) 10 MG tablet Take 10 mg by mouth daily.    [provider]  simethicone (MYLICON) 125 MG chewable tablet Chew 125 mg by mouth every 6 (six) hours as needed for flatulence.    [provider]  Vibegron (GEMTESA) 75 MG TABS Take 1 tablet by mouth daily.    [provider]    Physical Exam    Vital Signs:  Iver Nestle does not have vital signs available for review today.  Given telephonic nature of communication, physical exam is limited. AAOx3. NAD. Normal affect.  Speech and respirations are unlabored.  Accessory Clinical Findings    None  Assessment & Plan    Primary Cardiologist: Verne Carrow, MD  Preoperative cardiovascular risk assessment. Cystoscopy with bladder biopsy by Dr. Mena Goes on 10/02/2023.  Chart reviewed as part of pre-operative protocol coverage. According to the RCRI, patient has a 0.9% risk of MACE. Patient reports activity equivalent to 5.07 METS (per DASI).   Given past medical history and time since last visit, based on ACC/AHA guidelines, Bradley Irwin would be at acceptable risk for the planned procedure without further cardiovascular testing.   Patient was advised that if he develops new symptoms prior to surgery to contact our office to arrange a follow-up appointment.  he verbalized understanding.  Per office protocol, he may hold Plavix for 5 days prior to procedure and should resume as soon as hemodynamically stable postoperatively. Ideally aspirin should be continued without interruption, however if the bleeding risk is too great, aspirin may be held for 5-7 days prior to surgery. Please resume aspirin post operatively when it is felt to be safe from a bleeding standpoint.    I will route this recommendation to the requesting party via Epic fax function.  Please call with questions.  Time:   Today, I have spent 5 minutes with the patient with telehealth technology discussing medical history, symptoms, and management plan.     Carlos Levering, NP  09/14/2023, 1:40 PM

## 2023-09-24 ENCOUNTER — Encounter (HOSPITAL_COMMUNITY): Payer: Self-pay

## 2023-09-26 ENCOUNTER — Ambulatory Visit: Payer: Medicare PPO | Admitting: Pulmonary Disease

## 2023-09-26 ENCOUNTER — Encounter: Payer: Self-pay | Admitting: Pulmonary Disease

## 2023-09-26 VITALS — BP 129/81 | HR 80 | Temp 97.0°F | Ht 66.0 in | Wt 184.2 lb

## 2023-09-26 DIAGNOSIS — J849 Interstitial pulmonary disease, unspecified: Secondary | ICD-10-CM

## 2023-09-26 NOTE — Patient Instructions (Signed)
Your CT scan shows stable interstitial changes in the lungs which is good news You have a follow-up CT scan already ordered for November and follow-up with Dr. Arbutus Ped I will see you back in clinic in 1 year.

## 2023-09-26 NOTE — Progress Notes (Signed)
Bradley Irwin    [id number]    Jun 25, 1946  Primary Care Physician:[name], Nyoka Cowden, MD  Referring Physician: [name], MD [address] [place],  [place] [address]  Chief complaint: Follow-up for interstitial lung disease  HPI: 77 y.o. with history of CL, lung cancer Diagnosed with stage Ia non-small cell lung cancer, adenocarcinoma s/p wedge resection of the left upper lobe with lymph node sampling in June 2020.  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  He is currently on observation under the care of Dr. Arbutus Ped with no active treatment.  Other history significant for kidney cancer status post right nephrectomy in 2008  Recent CT showed some interstitial abnormalities and has been referred here for further evaluation  Pets: No pets.  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  He has a feather pillow for the past 2 to 3 years Exposures: Possible asbestos and feather pillow exposure as above Smoking history: Quit smoking in 1984.  13-pack-year smoker Travel history: No significant travel Relevant family history: No family history of lung disease  Interim history: Discussed the use of AI scribe software for clinical note transcription with the patient, who gave verbal consent to proceed.  The patient, with a history of lung cancer, kidney cancer, and CLL, presents for a follow-up visit. He reports stable breathing and has been able to walk half a mile without difficulty. However, he expresses concern about a suspicious area found on his last scan, which is being monitored. The area is located on the left lung, the same side where he previously had lung cancer.  In addition to his lung concerns, the patient is scheduled for a bladder biopsy due to recent urinary problems and a cystoscopy that suggested possible bladder cancer. The biopsy is scheduled for the  upcoming week.       Outpatient Encounter Medications as of 09/26/2023  Medication Sig   acetaminophen (TYLENOL) 500 MG tablet Take 500-1,000 mg by mouth every 6 (six) hours as needed for moderate pain.   alfuzosin (UROXATRAL) 10 MG 24 hr tablet Take 10 mg by mouth daily.   allopurinol (ZYLOPRIM) 100 MG tablet Take 100 mg by mouth daily.   ALPRAZolam (XANAX) 0.5 MG tablet Take 0.5 mg by mouth 2 (two) times daily as needed for anxiety.    aspirin EC 81 MG tablet Take 81 mg by mouth daily. Swallow whole.   clidinium-chlordiazePOXIDE (LIBRAX) 5-2.5 MG capsule Take 1 capsule by mouth daily as needed (ibs).   clopidogrel (PLAVIX) 75 MG tablet TAKE 1 TABLET BY MOUTH EVERY DAY   Colchicine 0.6 MG CAPS Take 1 tablet by mouth as needed (gout).   Cranberry-Vitamin C-Probiotic (AZO CRANBERRY PO) Take 2 tablets by mouth daily.   diclofenac Sodium (VOLTAREN) 1 % GEL Apply 1 Application topically 4 (four) times daily as needed (pain).   dicyclomine (BENTYL) 20 MG tablet Take 20 mg by mouth daily as needed for spasms.   Docosanol (ABREVA) 10 % CREA Apply 1 Application topically daily as needed (fever blisters).   Docusate Sodium (DSS) 100 MG CAPS Take 100 mg by mouth 2 (two) times daily as needed (constipation).   ferrous sulfate 325 (65 FE) MG tablet Take 325 mg by mouth daily.   fluticasone (FLONASE) 50 MCG/ACT nasal spray Place 1 spray into both nostrils daily.   hydrOXYzine (  ATARAX) 25 MG tablet Take 25 mg by mouth as needed for itching.   levothyroxine (SYNTHROID) 150 MCG tablet Take 150 mcg by mouth daily before breakfast.   Methenamine-Sodium Salicylate (AZO URINARY TRACT DEFENSE) 162-162.5 MG TABS Take 1 tablet by mouth as needed (urinary pain).   nitroGLYCERIN (NITROSTAT) 0.4 MG SL tablet Place 0.4 mg under the tongue every 5 (five) minutes as needed for chest pain.   omeprazole (PRILOSEC) 20 MG capsule Take 20 mg by mouth 2 (two) times daily.    Probiotic Product (PROBIOTIC PO) Take 1 capsule by  mouth daily.   rosuvastatin (CRESTOR) 10 MG tablet Take 10 mg by mouth daily.   simethicone (MYLICON) 125 MG chewable tablet Chew 125 mg by mouth every 6 (six) hours as needed for flatulence.   Vibegron (GEMTESA) 75 MG TABS Take 1 tablet by mouth daily.   No facility-administered encounter medications on file as of 09/26/2023.   Physical Exam: Blood pressure 138/64, pulse (!) 101, temperature 97.7 F (36.5 C), temperature source Oral, height 5\' 6"  (1.676 m), weight 183 lb 3.2 oz (83.1 kg), SpO2 100 %. Gen:      No acute distress HEENT:  EOMI, sclera anicteric Neck:     No masses; no thyromegaly Lungs:    Clear to auscultation bilaterally; normal respiratory effort CV:         Regular rate and rhythm; no murmurs Abd:      + bowel sounds; soft, non-tender; no palpable masses, no distension Ext:    No edema; adequate peripheral perfusion Skin:      Warm and dry; no rash Neuro: alert and oriented x 3 Psych: normal mood and affect  Data Reviewed: Imaging: CT chest 02/03/2022-groundglass attenuation nodule in the right upper lobe.  Worsening lymphadenopathy in bilateral axilla and subpectoral nodal station.  Widespread areas of groundglass attenuation and septal thickening bilaterally.  Indeterminate pattern.    CT chest 08/02/2022-areas of groundglass attenuation, septal thickening.  CT chest 08/08/2023-nodular thickening along the inferior margin of the left upper lobe, stable groundglass nodule in the right upper lobe, stable lymph nodes, stable interstitial changes at the base I reviewed the images personally.  PFTs: 05/14/2019 FVC 3.54 [94%], FEV1 2.97 [108%], F/F 84, TLC 5.58 [89%], DLCO 20.25 [89%] Normal test  Labs: CTD serologies 03/13/2022-ANA 1: 40, cytoplasmic speckled  Assessment:  Evaluation for interstitial lung disease.   His CT scan does show some groundglass attenuation and septal thickening in the lower lobes bilaterally.  On review of his prior imaging this has been  present since 2017 and has not changed significantly.  I suspect he may have had pneumonia with postinflammatory fibrosis.  I am not convinced that this represents interstitial lung disease.  He is also asymptomatic with no symptoms of dyspnea.  CTD serologies with borderline elevation of ANA which is nonspecific.  He is getting routine CTs through Dr. Arbutus Ped, oncology and we will review those for any progression  Possible Bladder Cancer Scheduled for bladder biopsy on due to urinary problems and suspicious findings on cystoscopy. -Follow-up after biopsy for results and further management.  Lung Nodule Suspicious area on recent scan in the same lung previously affected by lung cancer. Concern for possible recurrence. -CT scan scheduled for November 2024 for further evaluation. -Follow-up with Dr. Jerolyn Center on 11/20/2023 for review of scan and further management.  General Health Maintenance / Followup Plans -Continue current level of physical activity as tolerated. -Follow-up in 1 year for routine pulmonary evaluation.  Plan/Recommendations: Follow-up CT scan already scheduled Return to clinic in 1 year  Chilton Greathouse MD [facility] 09/26/2023, 10:45 AM  CC: [name], MD

## 2023-09-27 ENCOUNTER — Encounter (HOSPITAL_COMMUNITY): Payer: Self-pay

## 2023-09-27 NOTE — Anesthesia Preprocedure Evaluation (Addendum)
Anesthesia Evaluation  Patient identified by MRN, date of birth, ID band Patient awake    Reviewed: Allergy & Precautions, NPO status , Patient's Chart, lab work & pertinent test results  History of Anesthesia Complications Negative for: history of anesthetic complications  Airway Mallampati: II  TM Distance: >3 FB Neck ROM: Full    Dental  (+) Dental Advisory Given, Teeth Intact   Pulmonary former smoker  Lung cancer s/p wedge resection     Pulmonary exam normal        Cardiovascular hypertension (recently off meds), + CAD, + Cardiac Stents and + Peripheral Vascular Disease  Normal cardiovascular exam     Neuro/Psych  PSYCHIATRIC DISORDERS Anxiety     negative neurological ROS     GI/Hepatic Neg liver ROS,GERD  Medicated and Controlled,,  Endo/Other  Hypothyroidism    Renal/GU Renal disease RCC s/p right nephrectomy       Musculoskeletal negative musculoskeletal ROS (+)    Abdominal   Peds  Hematology  On plavix CLL    Anesthesia Other Findings   Reproductive/Obstetrics                             Anesthesia Physical Anesthesia Plan  ASA: 3  Anesthesia Plan: General   Post-op Pain Management: Tylenol PO (pre-op)* and Minimal or no pain anticipated   Induction: Intravenous  PONV Risk Score and Plan: 2 and Treatment may vary due to age or medical condition, Ondansetron and Propofol infusion  Airway Management Planned: LMA  Additional Equipment: None  Intra-op Plan:   Post-operative Plan: Extubation in OR  Informed Consent: I have reviewed the patients History and Physical, chart, labs and discussed the procedure including the risks, benefits and alternatives for the proposed anesthesia with the patient or authorized representative who has indicated his/her understanding and acceptance.     Dental advisory given  Plan Discussed with: CRNA and  Anesthesiologist  Anesthesia Plan Comments: (See PAT note )        Anesthesia Quick Evaluation

## 2023-09-27 NOTE — Progress Notes (Signed)
DISCUSSION: Bradley Irwin is a 77 yo male who presents to PAT prior to CYSTOSCOPY WITH BLADDER BIOPSY FULGURATION on 10/02/23 with Dr. Mena Goes, PMH of former smoking, HTN, CAD s/p PCI to LAD (2006), PVD, lung cancer s/p VATS with LUL wedge resection (05/2019), GERD, hypothyroidism, RCC s/p right nephrectomy (2008), CKD, hepatic steatosis s/p right hepatic lobectomy, CLL (under observation).  Pt was last seen in cardiology clinic on 02/16/2023 by Dr. Clifton James.  At that time Bradley Irwin was stable from a cardiac standpoint.   "Preoperative cardiovascular risk assessment. Cystoscopy with bladder biopsy by Dr. Mena Goes on 10/02/2023.   Chart reviewed as part of pre-operative protocol coverage. According to the RCRI, patient has a 0.9% risk of MACE. Patient reports activity equivalent to 5.07 METS (per DASI).   Given past medical history and time since last visit, based on ACC/AHA guidelines, Bradley Irwin would be at acceptable risk for the planned procedure without further cardiovascular testing.  Per office protocol, he may hold Plavix for 5 days prior to procedure and should resume as soon as hemodynamically stable postoperatively. Ideally aspirin should be continued without interruption, however if the bleeding risk is too great, aspirin may be held for 5-7 days prior to surgery. Please resume aspirin post operatively when it is felt to be safe from a bleeding standpoint. "  He was referred to Pulmonology due to abnormal Chest CT which showed ground glass opacities c/f ILD. He saw Dr. Isaiah Serge on 10/2 who states: "His CT scan does show some groundglass attenuation and septal thickening in the lower lobes bilaterally.  On review of his prior imaging this has been present since 2017 and has not changed significantly.  I suspect he may have had pneumonia with postinflammatory fibrosis.  I am not convinced that this represents interstitial lung disease.  He is also asymptomatic with no symptoms of  dyspnea.  CTD serologies with borderline elevation of ANA which is nonspecific." Advised repeat CT scan in November (already scheduled), f/u with Oncology and f/u in 1 year.  VS: BP (!) 120/57   Pulse 77   Temp 36.4 C (Oral)   Ht 5\' 5"  (1.651 m)   Wt 82.1 kg   SpO2 (!) 9%   BMI 30.12 kg/m   PROVIDERS: [name], MD Cardiology: Verne Carrow, MD  Pulmonology: Chilton Greathouse, MD Oncology: Si Gaul, MD  LABS: Labs reviewed: Acceptable for surgery. 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 (all labs ordered are listed, but only abnormal results are displayed)  Labs Reviewed  BASIC METABOLIC PANEL - Abnormal; Notable for the following components:      Result Value   BUN 29 (*)    Creatinine, Ser 1.64 (*)    GFR, Estimated 43 (*)    All other components within normal limits  CBC - Abnormal; Notable for the following components:   WBC 13.4 (*)    All other components within normal limits  SURGICAL PCR SCREEN     IMAGES:  CT Chest 08/08/23:  IMPRESSION: 1. nodular thickening along the inferior margin of the LEFT upper lobe surgical resection is more prominent than prior. Recommend FDG PET scan versus interval short-term surveillance (3 to 6 months). 2. Stable ground-glass nodule in the RIGHT upper lobe. 3. 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 4. Stable splenomegaly and low-density lesion in the spleen.   EKG:   CV:  Echo 01/25/2021:  IMPRESSIONS     1. Left ventricular ejection fraction, by estimation,  is 65 to 70%. The  left ventricle has normal function. The left ventricle has no regional  wall motion abnormalities. There is mild left ventricular hypertrophy.  Left ventricular diastolic parameters  are consistent with Grade I diastolic dysfunction (impaired relaxation).   2. Right ventricular systolic function is normal. The right ventricular  size is normal. Tricuspid regurgitation signal is  inadequate for assessing  PA pressure.   3. The mitral valve is grossly normal. No evidence of mitral valve  regurgitation.   4. The aortic valve is tricuspid. Aortic valve regurgitation is trivial.  No aortic stenosis is present.   5. The inferior vena cava is normal in size with greater than 50%  respiratory variability, suggesting right atrial pressure of 3 mmHg.   Comparison(s): Changes from prior study are noted. 03/18/2013: LVEF 65%,  trivial AI.   Stress test 04/23/2019:  The left ventricular ejection fraction is normal (55-65%). Nuclear stress EF: 64%. No T wave inversion was noted during stress. This is a low risk study.   Normal perfusion. LVEF 64% with normal wall motion. This is a low risk study.  Past Medical History:  Diagnosis Date   Anxiety    Atypical mole 06/08/2010   Right Abdomen(moderate) Nino Glow)   Atypical mole 04/25/1995   Left Upper ShoulderBlade(slight)   Atypical mole 12/31/2013   Left Mid Back(Moderate) Nino Glow)   Colon polyps    Coronary artery disease    Eczema    GERD (gastroesophageal reflux disease)    Gout    Hearing loss    Hx of cardiovascular stress test    Lexiscan Myoview 5/16:  The study is normal. No ischemia identified. This is a low risk study. Overall left ventricular systolic function was normal. LV cavity size is normal. The left ventricular ejection fraction is normal (55-65%). Sensitivity reduced by bowel loop attenuation artifact.   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   Pneumonia    Psoriasis    PVD (peripheral vascular disease) (HCC)    Rash    Renal cell cancer (HCC)    Squamous cell carcinoma in situ    Wears glasses    Wears hearing aid    both ears    Past Surgical History:  Procedure Laterality Date   CARDIAC CATHETERIZATION     CHOLECYSTECTOMY  10/26/2011    COLONOSCOPY     CORONARY STENT PLACEMENT  2006   FINGER SURGERY Right 77 y/o   cut index rt   INTERCOSTAL NERVE BLOCK  06/16/2019   Procedure: Intercostal Nerve Block;  Surgeon: Delight Ovens, MD;  Location: [facility] OR;  Service: Thoracic;;   LIPOMA EXCISION Left 11/18/2013   Procedure: EXCISION POSTERIOR NECK LIPOMA ;  Surgeon: Almond Lint, MD;  Location: [facility];  Service: General;  Laterality: Left;   LIVER BIOPSY  09/18/2011   LIVER LOBECTOMY  10/26/2011   LYMPH NODE DISSECTION  06/16/2019   Procedure: Lymph Node Dissection;  Surgeon: Delight Ovens, MD;  Location: [facility] OR;  Service: Thoracic;;   NEPHRECTOMY Right 2008   right   TONSILLECTOMY     VIDEO ASSISTED THORACOSCOPY (VATS)/WEDGE RESECTION Left 06/16/2019   Procedure: VIDEO ASSISTED THORACOSCOPY (VATS)/MINI THOROCOTOMY WITH LEFT UPPER LOBE WEDGE RESECTION;  Surgeon: Delight Ovens, MD;  Location: [facility] OR;  Service: Thoracic;  Laterality: Left;   VIDEO BRONCHOSCOPY N/A 06/16/2019   Procedure: VIDEO BRONCHOSCOPY;  Surgeon: Delight Ovens, MD;  Location: [facility] OR;  Service: Thoracic;  Laterality: N/A;    MEDICATIONS:  acetaminophen (TYLENOL) 500 MG tablet   alfuzosin (UROXATRAL) 10 MG 24 hr tablet   allopurinol (ZYLOPRIM) 100 MG tablet   ALPRAZolam (XANAX) 0.5 MG tablet   aspirin EC 81 MG tablet   clidinium-chlordiazePOXIDE (LIBRAX) 5-2.5 MG capsule   clopidogrel (PLAVIX) 75 MG tablet   Colchicine 0.6 MG CAPS   Cranberry-Vitamin C-Probiotic (AZO CRANBERRY PO)   diclofenac Sodium (VOLTAREN) 1 % GEL   dicyclomine (BENTYL) 20 MG tablet   Docosanol (ABREVA) 10 % CREA   Docusate Sodium (DSS) 100 MG CAPS   ferrous sulfate 325 (65 FE) MG tablet   fluticasone (FLONASE) 50 MCG/ACT nasal spray   hydrOXYzine (ATARAX) 25 MG tablet   levothyroxine (SYNTHROID) 150 MCG tablet   Methenamine-Sodium Salicylate (AZO URINARY TRACT DEFENSE) 162-162.5 MG TABS   nitroGLYCERIN (NITROSTAT) 0.4 MG SL tablet    omeprazole (PRILOSEC) 20 MG capsule   Probiotic Product (PROBIOTIC PO)   rosuvastatin (CRESTOR) 10 MG tablet   simethicone (MYLICON) 125 MG chewable tablet   Vibegron (GEMTESA) 75 MG TABS   No current facility-administered medications for this encounter.   Marcille Blanco [facility] Surgical Short Stay/Anesthesiology [facility] Phone [phone] 09/27/2023 9:08 AM

## 2023-10-02 ENCOUNTER — Encounter (HOSPITAL_COMMUNITY)
Admission: RE | Disposition: A | Discharge status: Home / Self Care | Payer: Self-pay | Source: Ambulatory Visit | Attending: Urology

## 2023-10-02 ENCOUNTER — Ambulatory Visit (HOSPITAL_COMMUNITY): Payer: Medicare PPO | Admitting: Physician Assistant

## 2023-10-02 ENCOUNTER — Ambulatory Visit (HOSPITAL_BASED_OUTPATIENT_CLINIC_OR_DEPARTMENT_OTHER): Payer: Medicare PPO | Admitting: Physician Assistant

## 2023-10-02 ENCOUNTER — Encounter (HOSPITAL_COMMUNITY): Payer: Self-pay | Admitting: Urology

## 2023-10-02 ENCOUNTER — Ambulatory Visit (HOSPITAL_COMMUNITY)
Admission: RE | Admit: 2023-10-02 | Discharge: 2023-10-02 | Disposition: A | Discharge status: Home / Self Care | Payer: Medicare PPO | Source: Ambulatory Visit | Attending: Urology | Admitting: Urology

## 2023-10-02 ENCOUNTER — Ambulatory Visit (HOSPITAL_COMMUNITY): Payer: Medicare PPO

## 2023-10-02 DIAGNOSIS — N3080 Other cystitis without hematuria: Secondary | ICD-10-CM | POA: Diagnosis not present

## 2023-10-02 DIAGNOSIS — N3081 Other cystitis with hematuria: Secondary | ICD-10-CM | POA: Insufficient documentation

## 2023-10-02 DIAGNOSIS — C9110 Chronic lymphocytic leukemia of B-cell type not having achieved remission: Secondary | ICD-10-CM | POA: Insufficient documentation

## 2023-10-02 DIAGNOSIS — Z905 Acquired absence of kidney: Secondary | ICD-10-CM | POA: Diagnosis not present

## 2023-10-02 DIAGNOSIS — D090 Carcinoma in situ of bladder: Secondary | ICD-10-CM | POA: Diagnosis not present

## 2023-10-02 DIAGNOSIS — Z79899 Other long term (current) drug therapy: Secondary | ICD-10-CM | POA: Insufficient documentation

## 2023-10-02 DIAGNOSIS — I2510 Atherosclerotic heart disease of native coronary artery without angina pectoris: Secondary | ICD-10-CM

## 2023-10-02 DIAGNOSIS — N135 Crossing vessel and stricture of ureter without hydronephrosis: Secondary | ICD-10-CM | POA: Diagnosis not present

## 2023-10-02 DIAGNOSIS — F419 Anxiety disorder, unspecified: Secondary | ICD-10-CM | POA: Insufficient documentation

## 2023-10-02 DIAGNOSIS — Z85118 Personal history of other malignant neoplasm of bronchus and lung: Secondary | ICD-10-CM | POA: Insufficient documentation

## 2023-10-02 DIAGNOSIS — N3289 Other specified disorders of bladder: Secondary | ICD-10-CM | POA: Diagnosis not present

## 2023-10-02 DIAGNOSIS — K219 Gastro-esophageal reflux disease without esophagitis: Secondary | ICD-10-CM | POA: Insufficient documentation

## 2023-10-02 DIAGNOSIS — N3021 Other chronic cystitis with hematuria: Secondary | ICD-10-CM | POA: Diagnosis not present

## 2023-10-02 DIAGNOSIS — Z7902 Long term (current) use of antithrombotics/antiplatelets: Secondary | ICD-10-CM | POA: Insufficient documentation

## 2023-10-02 DIAGNOSIS — R310 Gross hematuria: Secondary | ICD-10-CM

## 2023-10-02 DIAGNOSIS — E039 Hypothyroidism, unspecified: Secondary | ICD-10-CM | POA: Diagnosis not present

## 2023-10-02 DIAGNOSIS — I10 Essential (primary) hypertension: Secondary | ICD-10-CM | POA: Diagnosis not present

## 2023-10-02 DIAGNOSIS — N3020 Other chronic cystitis without hematuria: Secondary | ICD-10-CM | POA: Diagnosis not present

## 2023-10-02 DIAGNOSIS — Z87891 Personal history of nicotine dependence: Secondary | ICD-10-CM | POA: Diagnosis not present

## 2023-10-02 DIAGNOSIS — D3020 Benign neoplasm of unspecified ureter: Secondary | ICD-10-CM | POA: Diagnosis not present

## 2023-10-02 HISTORY — PX: CYSTOSCOPY WITH BIOPSY: SHX5122

## 2023-10-02 HISTORY — PX: CYSTOSCOPY W/ RETROGRADES: SHX1426

## 2023-10-02 HISTORY — PX: CYSTOSCOPY/URETEROSCOPY/HOLMIUM LASER/STENT PLACEMENT: SHX6546

## 2023-10-02 SURGERY — CYSTOSCOPY, WITH BIOPSY
Anesthesia: General

## 2023-10-02 MED ORDER — FENTANYL CITRATE PF 50 MCG/ML IJ SOSY: 25.0000 ug | PREFILLED_SYRINGE | INTRAMUSCULAR | Status: DC | PRN

## 2023-10-02 MED ORDER — CLOPIDOGREL BISULFATE 75 MG PO TABS: 75.0000 mg | ORAL_TABLET | Freq: Every day | ORAL | Status: DC

## 2023-10-02 MED ORDER — PHENYLEPHRINE 80 MCG/ML (10ML) SYRINGE FOR IV PUSH (FOR BLOOD PRESSURE SUPPORT)
PREFILLED_SYRINGE | INTRAVENOUS | Status: DC | PRN
Start: 2023-10-02 — End: 2023-10-02
  Administered 2023-10-02: 80 ug via INTRAVENOUS

## 2023-10-02 MED ORDER — SODIUM CHLORIDE 0.9 % IR SOLN
Status: DC | PRN
  Administered 2023-10-02: 3000 mL via INTRAVESICAL

## 2023-10-02 MED ORDER — EPHEDRINE SULFATE-NACL 50-0.9 MG/10ML-% IV SOSY
PREFILLED_SYRINGE | INTRAVENOUS | Status: DC | PRN
Start: 2023-10-02 — End: 2023-10-02
  Administered 2023-10-02 (×3): 5 mg via INTRAVENOUS

## 2023-10-02 MED ORDER — PHENYLEPHRINE 80 MCG/ML (10ML) SYRINGE FOR IV PUSH (FOR BLOOD PRESSURE SUPPORT)
PREFILLED_SYRINGE | INTRAVENOUS | Status: AC
  Filled 2023-10-02: qty 10

## 2023-10-02 MED ORDER — OXYCODONE HCL 5 MG/5ML PO SOLN: 5.0000 mg | Freq: Once | ORAL | Status: DC | PRN

## 2023-10-02 MED ORDER — ONDANSETRON HCL 4 MG/2ML IJ SOLN: 4.0000 mg | Freq: Once | INTRAMUSCULAR | Status: DC | PRN

## 2023-10-02 MED ORDER — FENTANYL CITRATE (PF) 100 MCG/2ML IJ SOLN
INTRAMUSCULAR | Status: AC
  Filled 2023-10-02: qty 2

## 2023-10-02 MED ORDER — LIDOCAINE HCL URETHRAL/MUCOSAL 2 % EX GEL
CUTANEOUS | Status: DC | PRN
  Administered 2023-10-02: 1

## 2023-10-02 MED ORDER — EPHEDRINE 5 MG/ML INJ
INTRAVENOUS | Status: AC
  Filled 2023-10-02: qty 5

## 2023-10-02 MED ORDER — ACETAMINOPHEN 500 MG PO TABS
1000.0000 mg | ORAL_TABLET | Freq: Once | ORAL | Status: AC
  Administered 2023-10-02: 1000 mg via ORAL
  Filled 2023-10-02: qty 2

## 2023-10-02 MED ORDER — IOHEXOL 300 MG/ML  SOLN
INTRAMUSCULAR | Status: DC | PRN
Start: 2023-10-02 — End: 2023-10-02
  Administered 2023-10-02: 14 mL

## 2023-10-02 MED ORDER — OXYCODONE HCL 5 MG PO TABS: 5.0000 mg | ORAL_TABLET | Freq: Once | ORAL | Status: DC | PRN

## 2023-10-02 MED ORDER — CEFAZOLIN SODIUM-DEXTROSE 2-4 GM/100ML-% IV SOLN
2.0000 g | INTRAVENOUS | Status: AC
  Administered 2023-10-02: 2 g via INTRAVENOUS
  Filled 2023-10-02: qty 100

## 2023-10-02 MED ORDER — DEXAMETHASONE SODIUM PHOSPHATE 10 MG/ML IJ SOLN
INTRAMUSCULAR | Status: AC
  Filled 2023-10-02: qty 1

## 2023-10-02 MED ORDER — ONDANSETRON HCL 4 MG/2ML IJ SOLN
INTRAMUSCULAR | Status: AC
  Filled 2023-10-02: qty 2

## 2023-10-02 MED ORDER — OXYBUTYNIN CHLORIDE 5 MG PO TABS: 5.0000 mg | ORAL_TABLET | Freq: Two times a day (BID) | ORAL | 1 refills | Status: AC | PRN

## 2023-10-02 MED ORDER — LACTATED RINGERS IV SOLN
INTRAVENOUS | Status: DC | PRN
Start: 2023-10-02 — End: 2023-10-02

## 2023-10-02 MED ORDER — LIDOCAINE HCL (CARDIAC) PF 100 MG/5ML IV SOSY
PREFILLED_SYRINGE | INTRAVENOUS | Status: DC | PRN
Start: 2023-10-02 — End: 2023-10-02
  Administered 2023-10-02: 80 mg via INTRATRACHEAL

## 2023-10-02 MED ORDER — ONDANSETRON HCL 4 MG/2ML IJ SOLN
INTRAMUSCULAR | Status: DC | PRN
  Administered 2023-10-02: 4 mg via INTRAVENOUS

## 2023-10-02 MED ORDER — LACTATED RINGERS IV SOLN: INTRAVENOUS | Status: DC

## 2023-10-02 MED ORDER — PROPOFOL 10 MG/ML IV BOLUS
INTRAVENOUS | Status: DC | PRN
  Administered 2023-10-02: 120 mg via INTRAVENOUS

## 2023-10-02 MED ORDER — ORAL CARE MOUTH RINSE: 15.0000 mL | Freq: Once | OROMUCOSAL | Status: AC

## 2023-10-02 MED ORDER — PROPOFOL 10 MG/ML IV BOLUS
INTRAVENOUS | Status: AC
  Filled 2023-10-02: qty 20

## 2023-10-02 MED ORDER — LIDOCAINE HCL URETHRAL/MUCOSAL 2 % EX GEL
CUTANEOUS | Status: AC
  Filled 2023-10-02: qty 30

## 2023-10-02 MED ORDER — LIDOCAINE HCL (PF) 2 % IJ SOLN
INTRAMUSCULAR | Status: AC
  Filled 2023-10-02: qty 5

## 2023-10-02 MED ORDER — HYDROCODONE-ACETAMINOPHEN 5-325 MG PO TABS: 1.0000 | ORAL_TABLET | Freq: Four times a day (QID) | ORAL | 0 refills | Status: DC | PRN

## 2023-10-02 MED ORDER — CHLORHEXIDINE GLUCONATE 0.12 % MT SOLN
15.0000 mL | Freq: Once | OROMUCOSAL | Status: AC
  Administered 2023-10-02: 15 mL via OROMUCOSAL

## 2023-10-02 MED ORDER — FENTANYL CITRATE (PF) 100 MCG/2ML IJ SOLN
INTRAMUSCULAR | Status: DC | PRN
  Administered 2023-10-02 (×2): 25 ug via INTRAVENOUS

## 2023-10-02 MED ORDER — STERILE WATER FOR IRRIGATION IR SOLN
Status: DC | PRN
Start: 2023-10-02 — End: 2023-10-02
  Administered 2023-10-02: 6000 mL

## 2023-10-02 MED ORDER — DEXAMETHASONE SODIUM PHOSPHATE 10 MG/ML IJ SOLN
INTRAMUSCULAR | Status: DC | PRN
  Administered 2023-10-02: 10 mg via INTRAVENOUS

## 2023-10-02 SURGICAL SUPPLY — 23 items
BAG DRN RND TRDRP ANRFLXCHMBR (UROLOGICAL SUPPLIES)
BAG URINE DRAIN 2000ML AR STRL (UROLOGICAL SUPPLIES) IMPLANT
BAG URO CATCHER STRL LF (MISCELLANEOUS) ×3 IMPLANT
CATH URETERAL DUAL LUMEN 10F (MISCELLANEOUS) IMPLANT
CATH URETL OPEN END 6FR 70 (CATHETERS) IMPLANT
CLOTH BEACON ORANGE TIMEOUT ST (SAFETY) ×3 IMPLANT
DRAPE FOOT SWITCH (DRAPES) ×3 IMPLANT
GLOVE SURG LX STRL 7.5 STRW (GLOVE) ×3 IMPLANT
GOWN STRL REUS W/ TWL XL LVL3 (GOWN DISPOSABLE) ×3 IMPLANT
GOWN STRL REUS W/TWL XL LVL3 (GOWN DISPOSABLE) ×2
GUIDEWIRE STR DUAL SENSOR (WIRE) ×3 IMPLANT
KIT TURNOVER KIT A (KITS) IMPLANT
LASER FIB FLEXIVA PULSE ID 365 (Laser) IMPLANT
LOOP CUT BIPOLAR 24F LRG (ELECTROSURGICAL) IMPLANT
MANIFOLD NEPTUNE II (INSTRUMENTS) ×3 IMPLANT
NS IRRIG 1000ML POUR BTL (IV SOLUTION) IMPLANT
PACK CYSTO (CUSTOM PROCEDURE TRAY) ×3 IMPLANT
PAD PREP 24X48 CUFFED NSTRL (MISCELLANEOUS) ×3 IMPLANT
STENT URET 6FRX26 CONTOUR (STENTS) IMPLANT
TRACTIP FLEXIVA PULS ID 200XHI (Laser) IMPLANT
TRACTIP FLEXIVA PULSE ID 200 (Laser)
TUBING CONNECTING 10 (TUBING) ×3 IMPLANT
TUBING UROLOGY SET (TUBING) ×3 IMPLANT

## 2023-10-02 NOTE — H&P (Signed)
H&P  Chief Complaint: Gross hematuria, bladder erythema  History of Present Illness: Bradley Irwin is a 77 year old male he has a history of a right nephrectomy in 2008.  He had recent gross hematuria.  MRCP in July 2024 showed a normal left kidney.  Office cystoscopy revealed some posterior bladder erythema and ulceration.  Cytology was atypical.  Urine culture was negative.  He also had urgency and bladder spasms which radiate out into his penis.  He has some mild dysuria when he voids and recent tea colored urine.  No fever.  Overall he feels like his symptoms are improved.  He is on alfuzosin and Gemtesa.  He was brought today for cystoscopy with bladder biopsy and fulguration and left retrograde pyelogram.  Past Medical History:  Diagnosis Date   Anxiety    Atypical mole 06/08/2010   Right Abdomen(moderate) Nino Glow)   Atypical mole 04/25/1995   Left Upper ShoulderBlade(slight)   Atypical mole 12/31/2013   Left Mid Back(Moderate) Nino Glow)   Colon polyps    Coronary artery disease    Eczema    GERD (gastroesophageal reflux disease)    Gout    Hearing loss    Hx of cardiovascular stress test    Lexiscan Myoview 5/16:  The study is normal. No ischemia identified. This is a low risk study. Overall left ventricular systolic function was normal. LV cavity size is normal. The left ventricular ejection fraction is normal (55-65%). Sensitivity reduced by bowel loop attenuation artifact.   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   Pneumonia    Psoriasis    PVD (peripheral vascular disease) (HCC)    Rash    Renal cell cancer (HCC)    Squamous cell carcinoma in situ    Wears glasses    Wears hearing aid    both ears   Past Surgical History:  Procedure Laterality Date   CARDIAC CATHETERIZATION     CHOLECYSTECTOMY  10/26/2011   COLONOSCOPY      CORONARY STENT PLACEMENT  2006   FINGER SURGERY Right 77 y/o   cut index rt   INTERCOSTAL NERVE BLOCK  06/16/2019   Procedure: Intercostal Nerve Block;  Surgeon: Delight Ovens, MD;  Location: [facility] OR;  Service: Thoracic;;   LIPOMA EXCISION Left 11/18/2013   Procedure: EXCISION POSTERIOR NECK LIPOMA ;  Surgeon: Almond Lint, MD;  Location: [facility];  Service: General;  Laterality: Left;   LIVER BIOPSY  09/18/2011   LIVER LOBECTOMY  10/26/2011   LYMPH NODE DISSECTION  06/16/2019   Procedure: Lymph Node Dissection;  Surgeon: Delight Ovens, MD;  Location: [facility] OR;  Service: Thoracic;;   NEPHRECTOMY Right 2008   right   TONSILLECTOMY     VIDEO ASSISTED THORACOSCOPY (VATS)/WEDGE RESECTION Left 06/16/2019   Procedure: VIDEO ASSISTED THORACOSCOPY (VATS)/MINI THOROCOTOMY WITH LEFT UPPER LOBE WEDGE RESECTION;  Surgeon: Delight Ovens, MD;  Location: [facility] OR;  Service: Thoracic;  Laterality: Left;   VIDEO BRONCHOSCOPY N/A 06/16/2019   Procedure: VIDEO BRONCHOSCOPY;  Surgeon: Delight Ovens, MD;  Location: [facility] OR;  Service: Thoracic;  Laterality: N/A;    Home Medications:  Medications Prior to Admission  Medication Sig Dispense Refill Last Dose   acetaminophen (TYLENOL) 500 MG tablet Take 500-1,000 mg by mouth every 6 (six) hours as needed for moderate pain.  Past Week   alfuzosin (UROXATRAL) 10 MG 24 hr tablet Take 10 mg by mouth daily.   10/01/2023 at 0800   allopurinol (ZYLOPRIM) 100 MG tablet Take 100 mg by mouth daily.   10/02/2023 at 0800   ALPRAZolam (XANAX) 0.5 MG tablet Take 0.5 mg by mouth 2 (two) times daily as needed for anxiety.    10/02/2023 at 0800   aspirin EC 81 MG tablet Take 81 mg by mouth daily. Swallow whole.   09/27/2023   clidinium-chlordiazePOXIDE (LIBRAX) 5-2.5 MG capsule Take 1 capsule by mouth daily as needed (ibs).      Colchicine 0.6 MG CAPS Take 1 tablet by mouth as needed (gout).   Past Week   Cranberry-Vitamin C-Probiotic (AZO CRANBERRY PO)  Take 2 tablets by mouth daily.   Past Week   diclofenac Sodium (VOLTAREN) 1 % GEL Apply 1 Application topically 4 (four) times daily as needed (pain).   Past Week   dicyclomine (BENTYL) 20 MG tablet Take 20 mg by mouth daily as needed for spasms.   Past Week   Docosanol (ABREVA) 10 % CREA Apply 1 Application topically daily as needed (fever blisters).   Past Month   Docusate Sodium (DSS) 100 MG CAPS Take 100 mg by mouth 2 (two) times daily as needed (constipation).   Past Month   fluticasone (FLONASE) 50 MCG/ACT nasal spray Place 1 spray into both nostrils daily.   Past Month   hydrOXYzine (ATARAX) 25 MG tablet Take 25 mg by mouth as needed for itching.   Past Month   levothyroxine (SYNTHROID) 150 MCG tablet Take 150 mcg by mouth daily before breakfast.   10/01/2023 at 2300   Methenamine-Sodium Salicylate (AZO URINARY TRACT DEFENSE) 162-162.5 MG TABS Take 1 tablet by mouth as needed (urinary pain).      omeprazole (PRILOSEC) 20 MG capsule Take 20 mg by mouth 2 (two) times daily.    10/02/2023 at 0800   Probiotic Product (PROBIOTIC PO) Take 1 capsule by mouth daily.   Past Week   rosuvastatin (CRESTOR) 10 MG tablet Take 10 mg by mouth daily.   10/01/2023 at 0800   simethicone (MYLICON) 125 MG chewable tablet Chew 125 mg by mouth every 6 (six) hours as needed for flatulence.   Past Month   Vibegron (GEMTESA) 75 MG TABS Take 1 tablet by mouth daily.   10/02/2023 at 0500   clopidogrel (PLAVIX) 75 MG tablet TAKE 1 TABLET BY MOUTH EVERY DAY 90 tablet 3    ferrous sulfate 325 (65 FE) MG tablet Take 325 mg by mouth daily.      nitroGLYCERIN (NITROSTAT) 0.4 MG SL tablet Place 0.4 mg under the tongue every 5 (five) minutes as needed for chest pain.   More than a month   Allergies:  Allergies  Allergen Reactions   Sulfa Antibiotics Other (See Comments)    Happened when he was child. Does not remember reaction.   Demerol Other (See Comments)    Severe headache.   Pseudoephedrine Other (See Comments)     Hypertension     Family History  Problem Relation Age of Onset   Hypertension Mother    Heart failure Mother    Heart disease Mother    Aneurysm Mother        brain   Peripheral vascular disease Father    Lung cancer Father    Hypertension Sister    Breast cancer Sister    Heart attack Maternal Grandfather    Heart attack Maternal Uncle  Colon cancer Neg Hx    Social History:  reports that he quit smoking about 39 years ago. His smoking use included cigarettes. He started smoking about 54 years ago. He has a 15 pack-year smoking history. He has never used smokeless tobacco. He reports that he does not currently use alcohol after a past usage of about 14.0 - 20.0 standard drinks of alcohol per week. He reports that he does not use drugs.  ROS: A complete review of systems was performed.  All systems are negative except for pertinent findings as noted. Review of Systems  All other systems reviewed and are negative.    Physical Exam:  Vital signs in last 24 hours: Temp:  [99.2 F (37.3 C)] 99.2 F (37.3 C) (10/08 0558) Pulse Rate:  [73] 73 (10/08 0558) Resp:  [18] 18 (10/08 0558) BP: (127)/(73) 127/73 (10/08 0558) SpO2:  [96 %] 96 % (10/08 0558) Weight:  [83.6 kg] 83.6 kg (10/08 0558) General:  Alert and oriented, No acute distress HEENT: Normocephalic, atraumatic Cardiovascular: Regular rate and rhythm Lungs: Regular rate and effort Abdomen: Soft, nontender, nondistended, no abdominal masses Back: No CVA tenderness Extremities: No edema Neurologic: Grossly intact  Laboratory Data:  No results found for this or any previous visit (from the past 24 hour(s)). No results found for this or any previous visit (from the past 240 hour(s)). Creatinine: No results for input(s): "CREATININE" in the last 168 hours.  Impression/Assessment:  Gross hematuria with urinary urgency and bladder erythema with ulceration-  Plan:  I discussed with the patient and his wife the  nature, potential benefits, risks and alternatives to cystoscopy with bladder biopsy and fulguration and left retrograde pyelogram, including side effects of the proposed treatment, the likelihood of the patient achieving the goals of the procedure, and any potential problems that might occur during the procedure or recuperation. All questions answered. Patient elects to proceed.   Jerilee Field 10/02/2023

## 2023-10-02 NOTE — Transfer of Care (Signed)
Immediate Anesthesia Transfer of Care Note  Patient: Bradley Irwin  Procedure(s) Performed: CYSTOSCOPY WITH BLADDER BIOPSY FULGURATION CYSTOSCOPY WITH RETROGRADE PYELOGRAM (Left) CYSTOSCOPY, LEFT DIAGNOSTIC URETEROSCOPY/STENT PLACEMENT (Left)  Patient Location: PACU  Anesthesia Type:General  Level of Consciousness: drowsy  Airway & Oxygen Therapy: Patient Spontanous Breathing and Patient connected to face mask oxygen  Post-op Assessment: Report given to RN and Post -op Vital signs reviewed and stable  Post vital signs: Reviewed and stable  Last Vitals:  Vitals Value Taken Time  BP 147/75 10/02/23 0846  Temp    Pulse 97 10/02/23 0848  Resp 17 10/02/23 0848  SpO2 97 % 10/02/23 0848  Vitals shown include unfiled device data.  Last Pain:  Vitals:   10/02/23 0558  TempSrc: Oral  PainSc: 0-No pain      Patients Stated Pain Goal: 4 (10/02/23 0558)  Complications: No notable events documented.

## 2023-10-02 NOTE — Anesthesia Procedure Notes (Signed)
Procedure Name: LMA Insertion Date/Time: 10/02/2023 7:40 AM  Performed by: Florene Route, CRNAPatient Re-evaluated:Patient Re-evaluated prior to induction Oxygen Delivery Method: Circle system utilized Preoxygenation: Pre-oxygenation with 100% oxygen Induction Type: IV induction LMA: LMA inserted LMA Size: 4.0 Number of attempts: 1 Placement Confirmation: positive ETCO2 and breath sounds checked- equal and bilateral Tube secured with: Tape Dental Injury: Teeth and Oropharynx as per pre-operative assessment

## 2023-10-02 NOTE — Anesthesia Postprocedure Evaluation (Signed)
Anesthesia Post Note  Patient: Bradley Irwin  Procedure(s) Performed: CYSTOSCOPY WITH BLADDER BIOPSY FULGURATION CYSTOSCOPY WITH RETROGRADE PYELOGRAM (Left) CYSTOSCOPY, LEFT DIAGNOSTIC URETEROSCOPY/STENT PLACEMENT (Left)     Patient location during evaluation: PACU Anesthesia Type: General Level of consciousness: awake and alert Pain management: pain level controlled Vital Signs Assessment: post-procedure vital signs reviewed and stable Respiratory status: spontaneous breathing, nonlabored ventilation and respiratory function stable Cardiovascular status: stable and blood pressure returned to baseline Anesthetic complications: no   No notable events documented.  Last Vitals:  Vitals:   10/02/23 0915 10/02/23 0938  BP: (!) 149/68 (!) 142/77  Pulse: 76 76  Resp: 17 17  Temp:  36.6 C  SpO2: (!) 82% 99%    Last Pain:  Vitals:   10/02/23 0938  TempSrc:   PainSc: 0-No pain                 Beryle Lathe

## 2023-10-02 NOTE — Op Note (Signed)
Preoperative diagnosis: Bladder erythema, gross hematuria Postoperative diagnosis: Same  Procedure: Cystoscopy, left retrograde pyelogram, bladder biopsy and fulguration 0.5 to 2 cm, left diagnostic ureteroscopy, left ureteral stent placement  Surgeon: Mena Goes  Anesthesia: General  Indication for procedure: Bradley Irwin a 77 year old male with gross hematuria.  Has been passing pink and tea colored urine.  Office cystoscopy revealed erythematous bladder mucosa.  He has a history of right nephrectomy.  Prior abdominal MRCP July 2024 showed a normal left kidney.  Findings: On cystoscopy the urethra was unremarkable, prostate with mild BPH but no obstruction.  Bladder with some erythematous mucosa primarily on the right side and at the left anterior.  When the bladder was filled and emptied these areas bled.  No papillary lesions.  No ulceration.  Slight cystitis cystica changes at the left anterior site.  Left retrograde pyelogram-this outlined a single ureter single collecting system unit.  The distal ureter was narrow but no proximal dilation.  The the renal pelvis and collecting system appeared normal except the midpole calyces looks smudged.  It was difficult to tell if it was a filling defect or some pyelovenous backflow.  Because of the narrowing in the distal ureter it took a little more pressure on the syringe to do the retrograde.  There was brisk drainage, but the efflux was bloody.  No obstruction.  Left ureteroscopy-the distal ureter was normal.  There were no mucosal lesions.  In the distal ureter heading up toward the mid ureter again it was narrow and I could not get the single channel semirigid scope to pass and I did not want to force it. I also tried to pass a single channel digital over a wire and it would not pass and then passed a dual-lumen exchange catheter to see if I could gently dilate the distal ureter but this would not pass.  I did not want to formally balloon dilate his ureter  and risk causing further trauma and scarring and therefore a stent was placed.  Exam under anesthesia revealed the penis was circumcised.  The glans, meatus and foreskin all appeared normal without lesion.  Testicles were descended bilaterally and palpably normal.  Scrotum was normal.  On DRE the prostate was about 30 g and smooth without hard area or nodule.  No palpable bladder or pelvic mass.  Description of procedure: After consent was obtained patient brought to the operating room.  After adequate anesthesia he was placed in lithotomy position and prepped and draped in the usual sterile fashion.  Timeout was performed to confirm the patient and procedure.  Cystoscope was passed per urethra and the bladder was carefully inspected with a 30 degree and 70 degree lens.  Left ureteral orifice was then cannulated with the 5 [place] open-ended catheter and left retrograde injection of contrast was performed.   We then turned our attention back to the bladder and the right bladder wall was biopsied and these were sent as right bladder wall.  Then the areas at the left anterior were biopsied and sent as left anterior.  Biopsy sites were fulgurated with the Bugbee electrode.  I then turned my attention back to the left kidney.  The left ureteral orifice is recannulated with a 5 [place] open-ended catheter and a second retrograde was performed.  The midpole looked more normal but I could not quite get it to fill out and it did not look as sharp as I would have liked.  I passed a single channel needlepoint semirigid scope into the  left distal ureter.  This was a no touch technique without a wire.  There was a narrow point I could not quite get up into the mid ureter.  I could see distally and the mucosa appeared normal but the ureter was buckling slightly and would not allow the scope to pass.  Therefore did not want to force it.  I passed a sensor wire under direct vision and backed the needlepoint scope out.  I  then passed a single channel digital ureteroscope over the wire but it would also not pass up into the mid ureter.  I swapped that out for a dual-lumen exchange catheter to see if I can gently dilate the ureter but it would not pass.  Given the manipulation the wire was backloaded on the cystoscope and a 6 x 26 cm stent advanced.  The wire was removed with good coil seen in the kidney and a good coil in the bladder.  The biopsy sites were inspected and noted to have excellent hemostasis under low pressure.  I instilled some lidocaine jelly per urethra and did an exam under anesthesia.  He was then awakened taken the cover room in stable condition.  Complications: None  Blood loss: Minimal   Specimens to pathology: #1 right bladder biopsy #2 left anterior bladder biopsy  Drains: 6 x 26 cm left ureteral stent  Disposition: Patient stable to PACU.  Discussed the procedure, postop care and follow-up with his wife.

## 2023-10-03 ENCOUNTER — Encounter (HOSPITAL_COMMUNITY): Payer: Self-pay | Admitting: Urology

## 2023-10-05 LAB — SURGICAL PATHOLOGY

## 2023-10-09 ENCOUNTER — Encounter: Payer: Self-pay | Admitting: Internal Medicine

## 2023-10-10 DIAGNOSIS — R350 Frequency of micturition: Secondary | ICD-10-CM | POA: Diagnosis not present

## 2023-10-10 DIAGNOSIS — Z85528 Personal history of other malignant neoplasm of kidney: Secondary | ICD-10-CM | POA: Diagnosis not present

## 2023-10-10 DIAGNOSIS — C672 Malignant neoplasm of lateral wall of bladder: Secondary | ICD-10-CM | POA: Diagnosis not present

## 2023-10-12 ENCOUNTER — Other Ambulatory Visit: Payer: Self-pay | Admitting: Urology

## 2023-10-12 ENCOUNTER — Telehealth: Payer: Self-pay

## 2023-10-12 NOTE — Telephone Encounter (Signed)
Pre-operative Risk Assessment    Patient Name: Bradley Irwin  DOB: 16-Sep-1946 MRN: [id number]      Request for Surgical Clearance    Procedure:   CYSTO, LEFT URETEROSCOPY POSSIBLE BIOPSY, STENT EXCHANGE  Date of Surgery:  Clearance 10/30/23                                 Surgeon:  DR. MATTHEW ESKRIDGE  Surgeon's Group or Practice Name:  [facility] Phone number:  [phone] Fax number:  [phone]   Type of Clearance Requested:   - Pharmacy:  Hold Aspirin and Clopidogrel (Plavix) HOLD ASA FOR 5 DAYS AND NEEDS INSTRUCTIONS ON WHEN TO HOLD PLAVIX   Type of Anesthesia:  Not Indicated   Additional requests/questions:    Signed, Michaelle Copas   10/12/2023, 4:16 PM

## 2023-10-15 ENCOUNTER — Telehealth: Payer: Self-pay | Admitting: *Deleted

## 2023-10-15 NOTE — Telephone Encounter (Signed)
Pt has been scheduled tele pre op appt add on ok per Edd Fabian, FNP due to med hold and procedure date. Tele appt 10/22/23. Med rec and consent are done.     Patient Consent for Virtual Visit        Bradley Irwin has provided verbal consent on 10/15/2023 for a virtual visit (video or telephone).   CONSENT FOR VIRTUAL VISIT FOR:  Bradley Irwin  By participating in this virtual visit I agree to the following:  I hereby voluntarily request, consent and authorize [facility] and its employed or contracted physicians, physician assistants, nurse practitioners or other licensed health care professionals (the Practitioner), to provide me with telemedicine health care services (the "Services") as deemed necessary by the treating Practitioner. I acknowledge and consent to receive the Services by the Practitioner via telemedicine. I understand that the telemedicine visit will involve communicating with the Practitioner through live audiovisual communication technology and the disclosure of certain medical information by electronic transmission. I acknowledge that I have been given the opportunity to request an in-person assessment or other available alternative prior to the telemedicine visit and am voluntarily participating in the telemedicine visit.  I understand that I have the right to withhold or withdraw my consent to the use of telemedicine in the course of my care at any time, without affecting my right to future care or treatment, and that the Practitioner or I may terminate the telemedicine visit at any time. I understand that I have the right to inspect all information obtained and/or recorded in the course of the telemedicine visit and may receive copies of available information for a reasonable fee.  I understand that some of the potential risks of receiving the Services via telemedicine include:  Delay or interruption in medical evaluation due to technological equipment  failure or disruption; Information transmitted may not be sufficient (e.g. poor resolution of images) to allow for appropriate medical decision making by the Practitioner; and/or  In rare instances, security protocols could fail, causing a breach of personal health information.  Furthermore, I acknowledge that it is my responsibility to provide information about my medical history, conditions and care that is complete and accurate to the best of my ability. I acknowledge that Practitioner's advice, recommendations, and/or decision may be based on factors not within their control, such as incomplete or inaccurate data provided by me or distortions of diagnostic images or specimens that may result from electronic transmissions. I understand that the practice of medicine is not an exact science and that Practitioner makes no warranties or guarantees regarding treatment outcomes. I acknowledge that a copy of this consent can be made available to me via my patient portal [facility] MyChart), or I can request a printed copy by calling the office of [facility].    I understand that my insurance will be billed for this visit.   I have read or had this consent read to me. I understand the contents of this consent, which adequately explains the benefits and risks of the Services being provided via telemedicine.  I have been provided ample opportunity to ask questions regarding this consent and the Services and have had my questions answered to my satisfaction. I give my informed consent for the services to be provided through the use of telemedicine in my medical care

## 2023-10-15 NOTE — Telephone Encounter (Signed)
Pt has been scheduled tele pre op appt add on ok per Edd Fabian, FNP due to med hold and procedure date. Tele appt 10/22/23. Med rec and consent are done.

## 2023-10-15 NOTE — Telephone Encounter (Signed)
Name: Bradley Irwin  DOB: March 18, 1946  MRN: [id number]  Primary Cardiologist: Verne Carrow, MD   Preoperative team, please contact this patient and set up a phone call appointment for further preoperative risk assessment. Please obtain consent and complete medication review. Thank you for your help.  I confirm that guidance regarding antiplatelet and oral anticoagulation therapy has been completed and, if necessary, noted below.  Per office protocol, he may hold Plavix for 5 days prior to procedure and should resume as soon as hemodynamically stable postoperatively. Ideally aspirin should be continued without interruption, however if the bleeding risk is too great, aspirin may be held for 5-7 days prior to surgery. Please resume aspirin post operatively when it is felt to be safe from a bleeding standpoint.   I also confirmed the patient resides in the state of [place]. As per [facility] Medical Board telemedicine laws, the patient must reside in the state in which the provider is licensed.   Ronney Asters, NP 10/15/2023, 11:06 AM [facility]

## 2023-10-19 DIAGNOSIS — Z125 Encounter for screening for malignant neoplasm of prostate: Secondary | ICD-10-CM | POA: Diagnosis not present

## 2023-10-19 DIAGNOSIS — Z1389 Encounter for screening for other disorder: Secondary | ICD-10-CM | POA: Diagnosis not present

## 2023-10-19 DIAGNOSIS — R7301 Impaired fasting glucose: Secondary | ICD-10-CM | POA: Diagnosis not present

## 2023-10-19 DIAGNOSIS — E039 Hypothyroidism, unspecified: Secondary | ICD-10-CM | POA: Diagnosis not present

## 2023-10-19 DIAGNOSIS — E781 Pure hyperglyceridemia: Secondary | ICD-10-CM | POA: Diagnosis not present

## 2023-10-22 ENCOUNTER — Ambulatory Visit: Payer: Medicare PPO | Attending: Cardiology

## 2023-10-22 DIAGNOSIS — Z01810 Encounter for preprocedural cardiovascular examination: Secondary | ICD-10-CM

## 2023-10-22 NOTE — Progress Notes (Signed)
Virtual Visit via Telephone Note   Because of Trea Kenworthy Payer's co-morbid illnesses, he is at least at moderate risk for complications without adequate follow up.  This format is felt to be most appropriate for this patient at this time.  The patient did not have access to video technology/had technical difficulties with video requiring transitioning to audio format only (telephone).  All issues noted in this document were discussed and addressed.  No physical exam could be performed with this format.  Please refer to the patient's chart for his consent to telehealth for [facility].  Evaluation Performed:  Preoperative cardiovascular risk assessment _____________   Date:  10/22/2023   Patient ID:  Bradley Irwin, DOB 04-15-1946, MRN [id number] Patient Location:  Home Provider location:   Office  Primary Care Provider:  [name], MD Primary Cardiologist:  Verne Carrow, MD  Chief Complaint / Patient Profile   77 y.o. y/o male with a h/o CAD, HTN, HLD, renal cell carcinoma s/p right nephrectomy, lung cancer s/p resection, lymphocytic lymphoma/CLL   who is pending cystoscopy with left ureteroscopy and stent exchange and presents today for telephonic preoperative cardiovascular risk assessment.  History of Present Illness    Bradley Irwin is a 77 y.o. male who presents via audio/video conferencing for a telehealth visit today.  Pt was last seen in cardiology clinic on 02/16/2023 by Dr. Clifton James.  At that time Bradley Irwin was doing well with no new cardiac complaints.  The patient is now pending procedure as outlined above. Since his last visit, he is doing well from a cardiac perspective however he is dealing with bladder cancer and also had a suspicious spot on his lung which he is being evaluated for currently.  He denies chest pain, shortness of breath, lower extremity edema, fatigue, palpitations, melena, hematuria, hemoptysis, diaphoresis, weakness,  presyncope, syncope, orthopnea, and PND.     Past Medical History    Past Medical History:  Diagnosis Date   Anxiety    Atypical mole 06/08/2010   Right Abdomen(moderate) Bradley Irwin)   Atypical mole 04/25/1995   Left Upper ShoulderBlade(slight)   Atypical mole 12/31/2013   Left Mid Back(Moderate) Bradley Irwin)   Colon polyps    Coronary artery disease    Eczema    GERD (gastroesophageal reflux disease)    Gout    Hearing loss    Hx of cardiovascular stress test    Lexiscan Myoview 5/16:  The study is normal. No ischemia identified. This is a low risk study. Overall left ventricular systolic function was normal. LV cavity size is normal. The left ventricular ejection fraction is normal (55-65%). Sensitivity reduced by bowel loop attenuation artifact.   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   Pneumonia    Psoriasis    PVD (peripheral vascular disease) (HCC)    Rash    Renal cell cancer (HCC)    Squamous cell carcinoma in situ    Wears glasses    Wears hearing aid    both ears   Past Surgical History:  Procedure Laterality Date   CARDIAC CATHETERIZATION     CHOLECYSTECTOMY  10/26/2011   COLONOSCOPY     CORONARY STENT PLACEMENT  2006   CYSTOSCOPY W/ RETROGRADES Left 10/02/2023   Procedure: CYSTOSCOPY WITH RETROGRADE PYELOGRAM;  Surgeon: Mena Goes,  Molli Hazard, MD;  Location: [facility];  Service: Urology;  Laterality: Left;  60 MINS FOR CASE   CYSTOSCOPY WITH BIOPSY N/A 10/02/2023   Procedure: CYSTOSCOPY WITH BLADDER BIOPSY FULGURATION;  Surgeon: Jerilee Field, MD;  Location: [facility];  Service: Urology;  Laterality: N/A;   CYSTOSCOPY/URETEROSCOPY/HOLMIUM LASER/STENT PLACEMENT Left 10/02/2023   Procedure: CYSTOSCOPY, LEFT DIAGNOSTIC URETEROSCOPY/STENT PLACEMENT;  Surgeon: Jerilee Field, MD;  Location: [facility];  Service: Urology;   Laterality: Left;   FINGER SURGERY Right 77 y/o   cut index rt   INTERCOSTAL NERVE BLOCK  06/16/2019   Procedure: Intercostal Nerve Block;  Surgeon: Delight Ovens, MD;  Location: [facility] OR;  Service: Thoracic;;   LIPOMA EXCISION Left 11/18/2013   Procedure: EXCISION POSTERIOR NECK LIPOMA ;  Surgeon: Almond Lint, MD;  Location: [facility];  Service: General;  Laterality: Left;   LIVER BIOPSY  09/18/2011   LIVER LOBECTOMY  10/26/2011   LYMPH NODE DISSECTION  06/16/2019   Procedure: Lymph Node Dissection;  Surgeon: Delight Ovens, MD;  Location: [facility] OR;  Service: Thoracic;;   NEPHRECTOMY Right 2008   right   TONSILLECTOMY     VIDEO ASSISTED THORACOSCOPY (VATS)/WEDGE RESECTION Left 06/16/2019   Procedure: VIDEO ASSISTED THORACOSCOPY (VATS)/MINI THOROCOTOMY WITH LEFT UPPER LOBE WEDGE RESECTION;  Surgeon: Delight Ovens, MD;  Location: [facility] OR;  Service: Thoracic;  Laterality: Left;   VIDEO BRONCHOSCOPY N/A 06/16/2019   Procedure: VIDEO BRONCHOSCOPY;  Surgeon: Delight Ovens, MD;  Location: [facility] OR;  Service: Thoracic;  Laterality: N/A;    Allergies  Allergies  Allergen Reactions   Sulfa Antibiotics Other (See Comments)    Happened when he was child. Does not remember reaction.   Demerol Other (See Comments)    Severe headache.   Pseudoephedrine Other (See Comments)    Hypertension     Home Medications    Prior to Admission medications   Medication Sig Start Date End Date Taking? Authorizing Provider  acetaminophen (TYLENOL) 500 MG tablet Take 500-1,000 mg by mouth every 6 (six) hours as needed for moderate pain.    [provider]  alfuzosin (UROXATRAL) 10 MG 24 hr tablet Take 10 mg by mouth daily. 08/07/22   [provider]  allopurinol (ZYLOPRIM) 100 MG tablet Take 100 mg by mouth daily.    [provider]  ALPRAZolam Prudy Feeler) 0.5 MG tablet Take 0.5 mg by mouth 2 (two) times daily as needed for anxiety.     [provider]   amitriptyline (ELAVIL) 10 MG tablet Take 10 mg by mouth at bedtime.    [provider]  aspirin EC 81 MG tablet Take 81 mg by mouth daily. Swallow whole.    [provider]  clidinium-chlordiazePOXIDE (LIBRAX) 5-2.5 MG capsule Take 1 capsule by mouth daily as needed (ibs).    [provider]  clopidogrel (PLAVIX) 75 MG tablet Take 1 tablet (75 mg total) by mouth daily. 10/04/23   Jerilee Field, MD  Colchicine 0.6 MG CAPS Take 1 tablet by mouth as needed (gout). 04/18/21   [provider]  Cranberry-Vitamin C-Probiotic (AZO CRANBERRY PO) Take 2 tablets by mouth daily.    [provider]  diclofenac Sodium (VOLTAREN) 1 % GEL Apply 1 Application topically 4 (four) times daily as needed (pain). 04/20/21   [provider]  dicyclomine (BENTYL) 20 MG tablet Take 20 mg by mouth daily as needed for spasms. 01/01/20   [provider]  Docosanol (ABREVA) 10 % CREA Apply 1  Application topically daily as needed (fever blisters).    [provider]  Docusate Sodium (DSS) 100 MG CAPS Take 100 mg by mouth 2 (two) times daily as needed (constipation). 02/10/20   [provider]  ferrous sulfate 325 (65 FE) MG tablet Take 325 mg by mouth daily. 08/01/21   [provider]  fluticasone (FLONASE) 50 MCG/ACT nasal spray Place 1 spray into both nostrils daily.    [provider]  HYDROcodone-acetaminophen (NORCO) 5-325 MG tablet Take 1 tablet by mouth every 6 (six) hours as needed for moderate pain. 10/02/23   Jerilee Field, MD  hydrOXYzine (ATARAX) 25 MG tablet Take 25 mg by mouth as needed for itching.    [provider]  levothyroxine (SYNTHROID) 150 MCG tablet Take 150 mcg by mouth daily before breakfast.    [provider]  Methenamine-Sodium Salicylate (AZO URINARY TRACT DEFENSE) 162-162.5 MG TABS Take 1 tablet by mouth as needed (urinary pain).    [provider]  nitroGLYCERIN (NITROSTAT)  0.4 MG SL tablet Place 0.4 mg under the tongue every 5 (five) minutes as needed for chest pain.    [provider]  omeprazole (PRILOSEC) 20 MG capsule Take 20 mg by mouth 2 (two) times daily.     [provider]  oxybutynin (DITROPAN) 5 MG tablet Take 1 tablet (5 mg total) by mouth 2 (two) times daily as needed for bladder spasms. 10/02/23   Jerilee Field, MD  Probiotic Product (PROBIOTIC PO) Take 1 capsule by mouth daily.    [provider]  rosuvastatin (CRESTOR) 10 MG tablet Take 10 mg by mouth daily.    [provider]  simethicone (MYLICON) 125 MG chewable tablet Chew 125 mg by mouth every 6 (six) hours as needed for flatulence.    [provider]  Vibegron (GEMTESA) 75 MG TABS Take 1 tablet by mouth daily.    [provider]    Physical Exam    Vital Signs:  Iver Nestle does not have vital signs available for review today.  Given telephonic nature of communication, physical exam is limited. AAOx3. NAD. Normal affect.  Speech and respirations are unlabored.  Accessory Clinical Findings    None  Assessment & Plan    1.  Preoperative Cardiovascular Risk Assessment: -Patient's RCRI score is 0.9%  The patient affirms he has been doing well without any new cardiac symptoms. They are able to achieve 5 METS without cardiac limitations. Therefore, based on ACC/AHA guidelines, the patient would be at acceptable risk for the planned procedure without further cardiovascular testing. The patient was advised that if he develops new symptoms prior to surgery to contact our office to arrange for a follow-up visit, and he verbalized understanding.   The patient was advised that if he develops new symptoms prior to surgery to contact our office to arrange for a follow-up visit, and he verbalized understanding.  Patient can hold Plavix 5 days prior to procedure and can hold ASA 81 mg 7 days prior if applicable  A copy of this note will  be routed to requesting surgeon.  Time:   Today, I have spent 7 minutes with the patient with telehealth technology discussing medical history, symptoms, and management plan.     Napoleon Form, Leodis Rains, NP  10/22/2023, 7:45 AM

## 2023-10-24 ENCOUNTER — Other Ambulatory Visit: Payer: Self-pay

## 2023-10-24 ENCOUNTER — Encounter (HOSPITAL_BASED_OUTPATIENT_CLINIC_OR_DEPARTMENT_OTHER): Payer: Self-pay | Admitting: Urology

## 2023-10-24 NOTE — Progress Notes (Signed)
Spoke w/ via phone for pre-op interview---pt Lab needs dos---- I stat         Lab results------ COVID test -----patient states asymptomatic no test needed Arrive at -------1200 10-30-2023 NPO after MN NO Solid Food.  Clear liquids from MN until---1100 Med rec completed Medications to take morning of surgery -----xanax prn, afuuzossan,, allopurinol, levothyroxine, levothyroximan, omeprazole, gemtesa Diabetic medication ----- Patient instructed no nail polish to be worn day of surgery Patient instructed to bring photo id and insurance card day of surgery Patient aware to have Driver (ride ) / caregiver    for 24 hours after surgery - , Evelena Asa, wife Patient Special Instructions -----none Pre-Op special Instructions -----none Patient verbalized understanding of instructions that were given at this phone interview. Patient denies chest pain, sob, fever, cough at the interview.   Cardiac clearance note ernest pick np  chart/epic to hold plavix 5 days, last dose to be 10-25-2023, note to hold 81 mg asa 7 days10-29-2024   Chest ct 08-13-2023 epic Echo 02-14-2023 epic Stress test 04-23-2019 epic EKG 02-16-2023 epic

## 2023-10-26 DIAGNOSIS — Z1339 Encounter for screening examination for other mental health and behavioral disorders: Secondary | ICD-10-CM | POA: Diagnosis not present

## 2023-10-26 DIAGNOSIS — C3492 Malignant neoplasm of unspecified part of left bronchus or lung: Secondary | ICD-10-CM | POA: Diagnosis not present

## 2023-10-26 DIAGNOSIS — I2510 Atherosclerotic heart disease of native coronary artery without angina pectoris: Secondary | ICD-10-CM | POA: Diagnosis not present

## 2023-10-26 DIAGNOSIS — F419 Anxiety disorder, unspecified: Secondary | ICD-10-CM | POA: Diagnosis not present

## 2023-10-26 DIAGNOSIS — I700 Atherosclerosis of aorta: Secondary | ICD-10-CM | POA: Diagnosis not present

## 2023-10-26 DIAGNOSIS — C8300 Small cell B-cell lymphoma, unspecified site: Secondary | ICD-10-CM | POA: Diagnosis not present

## 2023-10-26 DIAGNOSIS — N1831 Chronic kidney disease, stage 3a: Secondary | ICD-10-CM | POA: Diagnosis not present

## 2023-10-26 DIAGNOSIS — Z0000 Encounter for general adult medical examination without abnormal findings: Secondary | ICD-10-CM | POA: Diagnosis not present

## 2023-10-26 DIAGNOSIS — Z23 Encounter for immunization: Secondary | ICD-10-CM | POA: Diagnosis not present

## 2023-10-26 DIAGNOSIS — E039 Hypothyroidism, unspecified: Secondary | ICD-10-CM | POA: Diagnosis not present

## 2023-10-26 DIAGNOSIS — E7800 Pure hypercholesterolemia, unspecified: Secondary | ICD-10-CM | POA: Diagnosis not present

## 2023-10-26 DIAGNOSIS — Z1331 Encounter for screening for depression: Secondary | ICD-10-CM | POA: Diagnosis not present

## 2023-10-30 ENCOUNTER — Encounter (HOSPITAL_BASED_OUTPATIENT_CLINIC_OR_DEPARTMENT_OTHER): Payer: Self-pay | Admitting: Urology

## 2023-10-30 ENCOUNTER — Ambulatory Visit (HOSPITAL_BASED_OUTPATIENT_CLINIC_OR_DEPARTMENT_OTHER): Payer: Medicare PPO | Admitting: Anesthesiology

## 2023-10-30 ENCOUNTER — Ambulatory Visit (HOSPITAL_BASED_OUTPATIENT_CLINIC_OR_DEPARTMENT_OTHER)
Admission: RE | Admit: 2023-10-30 | Discharge: 2023-10-30 | Disposition: A | Discharge status: Home / Self Care | Payer: Medicare PPO | Attending: Urology | Admitting: Urology

## 2023-10-30 ENCOUNTER — Other Ambulatory Visit: Payer: Self-pay

## 2023-10-30 ENCOUNTER — Encounter (HOSPITAL_BASED_OUTPATIENT_CLINIC_OR_DEPARTMENT_OTHER)
Admission: RE | Disposition: A | Discharge status: Home / Self Care | Payer: Self-pay | Source: Home / Self Care | Attending: Urology

## 2023-10-30 DIAGNOSIS — R93421 Abnormal radiologic findings on diagnostic imaging of right kidney: Secondary | ICD-10-CM | POA: Diagnosis not present

## 2023-10-30 DIAGNOSIS — Z01818 Encounter for other preprocedural examination: Secondary | ICD-10-CM

## 2023-10-30 DIAGNOSIS — D0919 Carcinoma in situ of other urinary organs: Secondary | ICD-10-CM | POA: Diagnosis not present

## 2023-10-30 DIAGNOSIS — C642 Malignant neoplasm of left kidney, except renal pelvis: Secondary | ICD-10-CM | POA: Diagnosis not present

## 2023-10-30 DIAGNOSIS — Z466 Encounter for fitting and adjustment of urinary device: Secondary | ICD-10-CM | POA: Diagnosis not present

## 2023-10-30 DIAGNOSIS — Z87891 Personal history of nicotine dependence: Secondary | ICD-10-CM | POA: Insufficient documentation

## 2023-10-30 DIAGNOSIS — D49512 Neoplasm of unspecified behavior of left kidney: Secondary | ICD-10-CM | POA: Diagnosis not present

## 2023-10-30 DIAGNOSIS — I129 Hypertensive chronic kidney disease with stage 1 through stage 4 chronic kidney disease, or unspecified chronic kidney disease: Secondary | ICD-10-CM | POA: Diagnosis not present

## 2023-10-30 DIAGNOSIS — D090 Carcinoma in situ of bladder: Secondary | ICD-10-CM | POA: Diagnosis not present

## 2023-10-30 DIAGNOSIS — N189 Chronic kidney disease, unspecified: Secondary | ICD-10-CM | POA: Insufficient documentation

## 2023-10-30 DIAGNOSIS — Z905 Acquired absence of kidney: Secondary | ICD-10-CM | POA: Diagnosis not present

## 2023-10-30 DIAGNOSIS — Z955 Presence of coronary angioplasty implant and graft: Secondary | ICD-10-CM | POA: Diagnosis not present

## 2023-10-30 HISTORY — PX: CYSTOSCOPY WITH BIOPSY: SHX5122

## 2023-10-30 HISTORY — DX: Chronic lymphocytic leukemia of B-cell type not having achieved remission: C91.10

## 2023-10-30 HISTORY — PX: CYSTOSCOPY WITH URETEROSCOPY AND STENT PLACEMENT: SHX6377

## 2023-10-30 HISTORY — DX: Malignant neoplasm of bladder, unspecified: C67.9

## 2023-10-30 LAB — POCT I-STAT, CHEM 8
BUN: 41 mg/dL — ABNORMAL HIGH (ref 8–23)
Calcium, Ion: 1.45 mmol/L — ABNORMAL HIGH (ref 1.15–1.40)
Chloride: 108 mmol/L (ref 98–111)
Creatinine, Ser: 1.8 mg/dL — ABNORMAL HIGH (ref 0.61–1.24)
Glucose, Bld: 110 mg/dL — ABNORMAL HIGH (ref 70–99)
HCT: 41 % (ref 39.0–52.0)
Hemoglobin: 13.9 g/dL (ref 13.0–17.0)
Potassium: 3.8 mmol/L (ref 3.5–5.1)
Sodium: 141 mmol/L (ref 135–145)
TCO2: 21 mmol/L — ABNORMAL LOW (ref 22–32)

## 2023-10-30 LAB — SURGICAL PCR SCREEN
MRSA, PCR: NEGATIVE
Staphylococcus aureus: NEGATIVE

## 2023-10-30 SURGERY — CYSTOURETEROSCOPY, WITH STENT INSERTION
Anesthesia: General | Laterality: Left

## 2023-10-30 MED ORDER — ACETAMINOPHEN 10 MG/ML IV SOLN: 1000.0000 mg | Freq: Once | INTRAVENOUS | Status: DC | PRN

## 2023-10-30 MED ORDER — PROPOFOL 10 MG/ML IV BOLUS
INTRAVENOUS | Status: DC | PRN
  Administered 2023-10-30: 140 mg via INTRAVENOUS

## 2023-10-30 MED ORDER — STERILE WATER FOR IRRIGATION IR SOLN
Status: DC | PRN
  Administered 2023-10-30: 500 mL

## 2023-10-30 MED ORDER — FENTANYL CITRATE (PF) 100 MCG/2ML IJ SOLN
INTRAMUSCULAR | Status: AC
  Filled 2023-10-30: qty 2

## 2023-10-30 MED ORDER — SODIUM CHLORIDE 0.9 % IV SOLN: INTRAVENOUS | Status: DC

## 2023-10-30 MED ORDER — OXYCODONE HCL 5 MG/5ML PO SOLN
5.0000 mg | Freq: Once | ORAL | Status: DC | PRN
Start: 2023-10-30 — End: 2023-10-30

## 2023-10-30 MED ORDER — MUPIROCIN 2 % EX OINT: 1.0000 | TOPICAL_OINTMENT | Freq: Two times a day (BID) | CUTANEOUS | Status: DC

## 2023-10-30 MED ORDER — ONDANSETRON HCL 4 MG/2ML IJ SOLN
INTRAMUSCULAR | Status: AC
  Filled 2023-10-30: qty 2

## 2023-10-30 MED ORDER — FENTANYL CITRATE (PF) 100 MCG/2ML IJ SOLN
INTRAMUSCULAR | Status: DC | PRN
  Administered 2023-10-30 (×2): 50 ug via INTRAVENOUS

## 2023-10-30 MED ORDER — PROPOFOL 10 MG/ML IV BOLUS
INTRAVENOUS | Status: AC
  Filled 2023-10-30: qty 20

## 2023-10-30 MED ORDER — OXYCODONE HCL 5 MG PO TABS: 5.0000 mg | ORAL_TABLET | Freq: Once | ORAL | Status: DC | PRN

## 2023-10-30 MED ORDER — ONDANSETRON HCL 4 MG/2ML IJ SOLN
INTRAMUSCULAR | Status: DC | PRN
  Administered 2023-10-30: 4 mg via INTRAVENOUS

## 2023-10-30 MED ORDER — SODIUM CHLORIDE 0.9 % IR SOLN
Status: DC | PRN
  Administered 2023-10-30: 3000 mL via INTRAVESICAL

## 2023-10-30 MED ORDER — ONDANSETRON HCL 4 MG/2ML IJ SOLN: 4.0000 mg | Freq: Once | INTRAMUSCULAR | Status: DC | PRN

## 2023-10-30 MED ORDER — SODIUM CHLORIDE 0.9 % IV SOLN
2.0000 g | INTRAVENOUS | Status: AC
  Administered 2023-10-30: 2 g via INTRAVENOUS

## 2023-10-30 MED ORDER — FENTANYL CITRATE (PF) 100 MCG/2ML IJ SOLN: 25.0000 ug | INTRAMUSCULAR | Status: DC | PRN

## 2023-10-30 MED ORDER — LIDOCAINE 2% (20 MG/ML) 5 ML SYRINGE
INTRAMUSCULAR | Status: DC | PRN
  Administered 2023-10-30: 60 mg via INTRAVENOUS

## 2023-10-30 MED ORDER — SODIUM CHLORIDE 0.9 % IV SOLN
INTRAVENOUS | Status: AC
  Filled 2023-10-30: qty 100

## 2023-10-30 MED ORDER — CEFTRIAXONE SODIUM 2 G IJ SOLR
INTRAMUSCULAR | Status: AC
  Filled 2023-10-30: qty 20

## 2023-10-30 MED ORDER — LIDOCAINE HCL (PF) 2 % IJ SOLN
INTRAMUSCULAR | Status: AC
  Filled 2023-10-30: qty 5

## 2023-10-30 SURGICAL SUPPLY — 30 items
BAG DRAIN URO-CYSTO SKYTR STRL (DRAIN) ×2 IMPLANT
BAG DRN RND TRDRP ANRFLXCHMBR (UROLOGICAL SUPPLIES)
BAG DRN UROCATH (DRAIN) ×1
BAG URINE DRAIN 2000ML AR STRL (UROLOGICAL SUPPLIES) IMPLANT
BAG URINE LEG 500ML (DRAIN) IMPLANT
CATH FOLEY 2WAY SLVR 5CC 16FR (CATHETERS) IMPLANT
CATH URETERAL DUAL LUMEN 10F (MISCELLANEOUS) IMPLANT
CATH URETL OPEN 5X70 (CATHETERS) IMPLANT
CLOTH BEACON ORANGE TIMEOUT ST (SAFETY) ×2 IMPLANT
COVER FOOTSWITCH UNIV (MISCELLANEOUS) ×2 IMPLANT
ELECT REM PT RETURN 9FT ADLT (ELECTROSURGICAL) ×1
ELECTRODE REM PT RTRN 9FT ADLT (ELECTROSURGICAL) ×2 IMPLANT
GLOVE BIO SURGEON STRL SZ7.5 (GLOVE) ×2 IMPLANT
GLOVE BIO SURGEON STRL SZ8 (GLOVE) IMPLANT
GOWN STRL REUS W/TWL LRG LVL3 (GOWN DISPOSABLE) ×2 IMPLANT
GUIDEWIRE ANG ZIPWIRE 038X150 (WIRE) IMPLANT
GUIDEWIRE STR DUAL SENSOR (WIRE) ×2 IMPLANT
GUIDEWIRE ZIPWRE .038 STRAIGHT (WIRE) IMPLANT
IV NS IRRIG 3000ML ARTHROMATIC (IV SOLUTION) ×4 IMPLANT
KIT TURNOVER CYSTO (KITS) ×2 IMPLANT
MANIFOLD NEPTUNE II (INSTRUMENTS) ×2 IMPLANT
NDL HYPO 22X1.5 SAFETY MO (MISCELLANEOUS) IMPLANT
NEEDLE HYPO 22X1.5 SAFETY MO (MISCELLANEOUS)
NS IRRIG 500ML POUR BTL (IV SOLUTION) ×2 IMPLANT
PACK CYSTO (CUSTOM PROCEDURE TRAY) ×2 IMPLANT
SLEEVE SCD COMPRESS KNEE MED (STOCKING) ×2 IMPLANT
STENT URET 6FRX26 CONTOUR (STENTS) IMPLANT
TUBE CONNECTING 12X1/4 (SUCTIONS) ×2 IMPLANT
TUBING UROLOGY SET (TUBING) ×2 IMPLANT
WATER STERILE IRR 3000ML UROMA (IV SOLUTION) ×2 IMPLANT

## 2023-10-30 NOTE — H&P (Signed)
H&P  Chief Complaint: Bladder CIS, solitary left kidney, possible left renal neoplasm  History of Present Illness: Bradley Irwin is a 77 year old male with a history of solitary left kidney.  He had irritative voiding symptoms and gross hematuria.  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 Arbutus Ped is following.  To complete the workup he underwent a left retrograde pyelogram which showed some possible abnormality in the left midpole calyx.  I could not get the scope up the left ureter because it was tight distally and elected to passively dilate it with a stent rather than formal dilation.  He presents today for follow-up left diagnostic ureteroscopy possible biopsy.  His follow-up urine culture was negative.  He has been well without fever or dysuria. Tolerating stent better than he was immediate post-op.   Past Medical History:  Diagnosis Date   Anxiety    Atypical mole 06/08/2010   Right Abdomen(moderate) Nino Glow)   Atypical mole 04/25/1995   Left Upper ShoulderBlade(slight)   Atypical mole 12/31/2013   Left Mid Back(Moderate) Nino Glow)   Bladder cancer (HCC)    CLL (chronic lymphocytic leukemia) (HCC)    Colon polyps    Coronary artery disease    Eczema    GERD (gastroesophageal reflux disease)    Gout    Hearing loss    Hx of cardiovascular stress test    Lexiscan Myoview 5/16:  The study is normal. No ischemia identified. This is a low risk study. Overall left ventricular systolic function was normal. LV cavity size is normal. The left ventricular ejection fraction is normal (55-65%). Sensitivity reduced by bowel loop attenuation artifact.   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   Pneumonia    Psoriasis     PVD (peripheral vascular disease) (HCC)    Renal cell cancer (HCC) 2007   Squamous cell carcinoma in situ    Wears glasses    Wears hearing aid    both ears   Past Surgical History:  Procedure Laterality Date   CARDIAC CATHETERIZATION     CHOLECYSTECTOMY  10/26/2011   COLONOSCOPY     CORONARY STENT PLACEMENT  2006   CYSTOSCOPY W/ RETROGRADES Left 10/02/2023   Procedure: CYSTOSCOPY WITH RETROGRADE PYELOGRAM;  Surgeon: Jerilee Field, MD;  Location: [facility];  Service: Urology;  Laterality: Left;  60 MINS FOR CASE   CYSTOSCOPY WITH BIOPSY N/A 10/02/2023   Procedure: CYSTOSCOPY WITH BLADDER BIOPSY FULGURATION;  Surgeon: Jerilee Field, MD;  Location: [facility];  Service: Urology;  Laterality: N/A;   CYSTOSCOPY/URETEROSCOPY/HOLMIUM LASER/STENT PLACEMENT Left 10/02/2023   Procedure: CYSTOSCOPY, LEFT DIAGNOSTIC URETEROSCOPY/STENT PLACEMENT;  Surgeon: Jerilee Field, MD;  Location: [facility];  Service: Urology;  Laterality: Left;   FINGER SURGERY Right 77 y/o   cut index rt   INTERCOSTAL NERVE BLOCK  06/16/2019   Procedure: Intercostal Nerve Block;  Surgeon: Delight Ovens, MD;  Location: [facility] OR;  Service: Thoracic;;   LIPOMA EXCISION Left 11/18/2013   Procedure: EXCISION POSTERIOR NECK LIPOMA ;  Surgeon: Almond Lint, MD;  [facility];  Service: General;  Laterality: Left;   LIVER BIOPSY  09/18/2011   LIVER LOBECTOMY  10/26/2011   LYMPH NODE DISSECTION  06/16/2019  Procedure: Lymph Node Dissection;  Surgeon: Delight Ovens, MD;  Location: [facility] OR;  Service: Thoracic;;   NEPHRECTOMY Right 2008   right   TONSILLECTOMY     VIDEO ASSISTED THORACOSCOPY (VATS)/WEDGE RESECTION Left 06/16/2019   Procedure: VIDEO ASSISTED THORACOSCOPY (VATS)/MINI THOROCOTOMY WITH LEFT UPPER LOBE WEDGE RESECTION;  Surgeon: Delight Ovens, MD;  Location: [facility] OR;  Service: Thoracic;  Laterality: Left;   VIDEO BRONCHOSCOPY N/A 06/16/2019   Procedure: VIDEO BRONCHOSCOPY;  Surgeon:  Delight Ovens, MD;  Location: [facility] OR;  Service: Thoracic;  Laterality: N/A;    Home Medications:  No medications prior to admission.   Allergies:  Allergies  Allergen Reactions   Sulfa Antibiotics Other (See Comments)    Happened when he was child. Does not remember reaction.   Demerol Other (See Comments)    Severe headache.   Pseudoephedrine Other (See Comments)    Hypertension     Family History  Problem Relation Age of Onset   Hypertension Mother    Heart failure Mother    Heart disease Mother    Aneurysm Mother        brain   Peripheral vascular disease Father    Lung cancer Father    Hypertension Sister    Breast cancer Sister    Heart attack Maternal Grandfather    Heart attack Maternal Uncle    Colon cancer Neg Hx    Social History:  reports that he quit smoking about 40 years ago. His smoking use included cigarettes. He started smoking about 55 years ago. He has a 15 pack-year smoking history. He has never used smokeless tobacco. He reports that he does not currently use alcohol after a past usage of about 14.0 - 20.0 standard drinks of alcohol per week. He reports that he does not use drugs.  ROS: A complete review of systems was performed.  All systems are negative except for pertinent findings as noted. Review of Systems  All other systems reviewed and are negative.    Physical Exam:  Vital signs in last 24 hours:   General:  Alert and oriented, No acute distress HEENT: Normocephalic, atraumatic Cardiovascular: Regular rate and rhythm Lungs: Regular rate and effort Abdomen: Soft, nontender, nondistended, no abdominal masses Back: No CVA tenderness Extremities: No edema Neurologic: Grossly intact  Laboratory Data:  No results found for this or any previous visit (from the past 24 hour(s)). No results found for this or any previous visit (from the past 240 hour(s)). Creatinine: No results for input(s): "CREATININE" in the last 168  hours.  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  Discussed rationale for stent exchange and dwell time of the stent will depend on if we have to dilate his ureter and how it looks on the way out.  We discussed the main priority is to keep his kidney open and draining and keep his ureter safe.  All questions answered. Patient elects to proceed. He stopped plavix.   Jerilee Field 10/30/2023

## 2023-10-30 NOTE — Op Note (Signed)
Preoperative diagnosis: Bladder CIS, solitary left kidney, left collecting system/renal pelvis neoplasm Postoperative diagnosis: Bladder CIS, solitary left kidney  Procedure: Cystoscopy with left diagnostic ureteroscopy, left retrograde pyelogram, left ureteral stent exchange  Surgeon: Mena Goes  Anesthesia: General  Indication for procedure: Bradley Irwin is a 77 year old male who had bothersome frequency urgency and gross hematuria.  He had bladder erythema and was brought for bladder biopsy and left retrograde.  Bladder biopsies were consistent with CIS and left retrograde revealed possible abnormality in a left midpole calyx.  He has a solitary left kidney and the distal ureter was narrow and I could not get up with the ureteroscope and he was therefore stented and passively dilated.  He was brought today for diagnostic left ureteroscopy.  Findings: On cystoscopy the biopsy sites were healing well and had excellent hemostasis.  Left ureteral stent was in place and removed.  On left ureteroscopy the ureter and collecting system appeared normal.  He had some small infundibula and using the scope in the proximal ureter I repeated the retrograde and was able to fill out his collecting system and it was noted to be normal on pyelogram as well.  Description of procedure: After consent was obtained patient brought to the operating room.  After adequate anesthesia he is placed lithotomy position and prepped and draped in the usual sterile fashion.  Timeout was performed to confirm the patient and procedure.  Cystoscope was passed per urethra and the bladder was inspected.  The left ureteral stent was grasped and removed.  I then took a semirigid ureteroscope single channel and advanced that into the ureter without difficulty I was able to get up in the mid to proximal ureter without any issues.  This all appeared normal and a wire was passed under direct vision and I tried to leave it in the proximal ureter and not  disturb the collecting system any further.  The semirigid scope was backed out and then a single channel digital scope was advanced up into the proximal ureter and the wire removed.  I then examined the collecting system and it was noted to be normal and there were a couple of infundibula up toward the upper pole that were too narrow to even enter.  Therefore I backed out into the proximal ureter and injected contrast retrograde to fill out the renal pelvis and collecting system and this was noted to be normal.  I saved in image.  I then passed a sensor wire under direct vision and inspected the collecting system renal pelvis and ureter on the way out and that was noted to be normal without injury.  The wire was backloaded on the cystoscope and a 624 cm stent advanced.  The wire was removed with a good coil seen in the kidney and a good coil in the bladder.  He was then awakened and taken to the cover room in stable condition.  I did leave a tether on the stent and this was taped to the patient.  Complications: None  Blood loss: Minimal  Specimens: None  Drains: 6 x 24 cm left ureteral stent with string  Disposition: Patient stable to PACU

## 2023-10-30 NOTE — Anesthesia Procedure Notes (Signed)
Procedure Name: LMA Insertion Date/Time: 10/30/2023 2:27 PM  Performed by: Ryan Palermo D, CRNAPre-anesthesia Checklist: Patient identified, Emergency Drugs available, Suction available and Patient being monitored Patient Re-evaluated:Patient Re-evaluated prior to induction Oxygen Delivery Method: Circle system utilized Preoxygenation: Pre-oxygenation with 100% oxygen Induction Type: IV induction Ventilation: Mask ventilation without difficulty LMA: LMA inserted LMA Size: 4.0 Tube type: Oral Number of attempts: 1 Placement Confirmation: positive ETCO2 and breath sounds checked- equal and bilateral Tube secured with: Tape Dental Injury: Teeth and Oropharynx as per pre-operative assessment

## 2023-10-30 NOTE — Anesthesia Preprocedure Evaluation (Signed)
Anesthesia Evaluation  Patient identified by MRN, date of birth, ID band Patient awake    Reviewed: Allergy & Precautions, NPO status , Patient's Chart, lab work & pertinent test results, reviewed documented beta blocker date and time   History of Anesthesia Complications Negative for: history of anesthetic complications  Airway Mallampati: II  TM Distance: >3 FB   Mouth opening: Limited Mouth Opening  Dental no notable dental hx.    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    Endo/Other  Hypothyroidism    Renal/GU CRFRenal disease     Musculoskeletal   Abdominal   Peds  Hematology   Anesthesia Other Findings   Reproductive/Obstetrics                              Anesthesia Physical Anesthesia Plan  ASA: 3  Anesthesia Plan: General   Post-op Pain Management:    Induction:   PONV Risk Score and Plan: 2 and Ondansetron  Airway Management Planned: LMA  Additional Equipment:   Intra-op Plan:   Post-operative Plan: Extubation in OR  Informed Consent: I have reviewed the patients History and Physical, chart, labs and discussed the procedure including the risks, benefits and alternatives for the proposed anesthesia with the patient or authorized representative who has indicated his/her understanding and acceptance.     Dental advisory given  Plan Discussed with: CRNA  Anesthesia Plan Comments:          Anesthesia Quick Evaluation

## 2023-10-30 NOTE — Transfer of Care (Signed)
Immediate Anesthesia Transfer of Care Note  Patient: PEARSE SHIFFLER  Procedure(s) Performed: CYSTOSCOPY WITH  LEFT URETEROSCOPY AND LEFT URETERAL STENT EXCHANGE (Left) CYSTOSCOPY WITH RETROROGRADE (Left)  Patient Location: PACU  Anesthesia Type:General  Level of Consciousness: awake, alert , and oriented  Airway & Oxygen Therapy: Patient Spontanous Breathing and Patient connected to nasal cannula oxygen  Post-op Assessment: Report given to RN and Post -op Vital signs reviewed and stable  Post vital signs: Reviewed and stable  Last Vitals:  Vitals Value Taken Time  BP 130/89 10/30/23 1505  Temp    Pulse 93 10/30/23 1506  Resp 11 10/30/23 1506  SpO2 97 % 10/30/23 1506  Vitals shown include unfiled device data.  Last Pain:  Vitals:   10/30/23 1240  TempSrc: Oral  PainSc: 3       Patients Stated Pain Goal: 3 (10/30/23 1240)  Complications: No notable events documented.

## 2023-10-30 NOTE — Discharge Instructions (Addendum)
Removal of the stent: Remove the stent on Friday morning, November 02, 2023, by pulling the string as instructed.  Post Anesthesia Home Care Instructions  Activity: Get plenty of rest for the remainder of the day. A responsible adult should stay with you for 24 hours following the procedure.  For the next 24 hours, DO NOT: -Drive a car -Advertising copywriter -Drink alcoholic beverages -Take any medication unless instructed by your physician -Make any legal decisions or sign important papers.  Meals: Start with liquid foods such as gelatin or soup. Progress to regular foods as tolerated. Avoid greasy, spicy, heavy foods. If nausea and/or vomiting occur, drink only clear liquids until the nausea and/or vomiting subsides. Call your physician if vomiting continues.  Special Instructions/Symptoms: Your throat may feel dry or sore from the anesthesia or the breathing tube placed in your throat during surgery. If this causes discomfort, gargle with warm salt water. The discomfort should disappear within 24 hours.

## 2023-10-31 NOTE — Anesthesia Postprocedure Evaluation (Signed)
Anesthesia Post Note  Patient: Bradley Irwin  Procedure(s) Performed: CYSTOSCOPY WITH  LEFT URETEROSCOPY AND LEFT URETERAL STENT EXCHANGE (Left) CYSTOSCOPY WITH RETROROGRADE (Left)     Patient location during evaluation: PACU Anesthesia Type: General Level of consciousness: awake and alert Pain management: pain level controlled Vital Signs Assessment: post-procedure vital signs reviewed and stable Respiratory status: spontaneous breathing, nonlabored ventilation, respiratory function stable and patient connected to nasal cannula oxygen Cardiovascular status: blood pressure returned to baseline and stable Postop Assessment: no apparent nausea or vomiting Anesthetic complications: no  No notable events documented.  Last Vitals:  Vitals:   10/30/23 1553 10/30/23 1616  BP:  131/64  Pulse:  81  Resp:  16  Temp: (!) 36.4 C (!) 36.4 C  SpO2:  97%    Last Pain:  Vitals:   10/30/23 1616  TempSrc:   PainSc: 0-No pain                 Sabra Sessler

## 2023-11-01 ENCOUNTER — Encounter: Payer: Self-pay | Admitting: Internal Medicine

## 2023-11-01 ENCOUNTER — Encounter (HOSPITAL_BASED_OUTPATIENT_CLINIC_OR_DEPARTMENT_OTHER): Payer: Self-pay | Admitting: Urology

## 2023-11-03 ENCOUNTER — Telehealth: Payer: Self-pay | Admitting: Internal Medicine

## 2023-11-03 NOTE — Telephone Encounter (Signed)
Rescheduled 11/26 per provider pal, patient has been called and notified regarding new appointment date/time.

## 2023-11-14 ENCOUNTER — Inpatient Hospital Stay: Payer: Medicare PPO | Attending: Internal Medicine

## 2023-11-14 DIAGNOSIS — C3412 Malignant neoplasm of upper lobe, left bronchus or lung: Secondary | ICD-10-CM | POA: Diagnosis not present

## 2023-11-14 DIAGNOSIS — Z79899 Other long term (current) drug therapy: Secondary | ICD-10-CM | POA: Insufficient documentation

## 2023-11-14 DIAGNOSIS — C3490 Malignant neoplasm of unspecified part of unspecified bronchus or lung: Secondary | ICD-10-CM

## 2023-11-14 LAB — CBC WITH DIFFERENTIAL (CANCER CENTER ONLY)
Abs Immature Granulocytes: 0.02 10*3/uL (ref 0.00–0.07)
Basophils Absolute: 0 10*3/uL (ref 0.0–0.1)
Basophils Relative: 0 %
Eosinophils Absolute: 0.1 10*3/uL (ref 0.0–0.5)
Eosinophils Relative: 1 %
HCT: 42.9 % (ref 39.0–52.0)
Hemoglobin: 13.9 g/dL (ref 13.0–17.0)
Immature Granulocytes: 0 %
Lymphocytes Relative: 64 %
Lymphs Abs: 8.1 10*3/uL — ABNORMAL HIGH (ref 0.7–4.0)
MCH: 26.9 pg (ref 26.0–34.0)
MCHC: 32.4 g/dL (ref 30.0–36.0)
MCV: 83 fL (ref 80.0–100.0)
Monocytes Absolute: 0.6 10*3/uL (ref 0.1–1.0)
Monocytes Relative: 5 %
Neutro Abs: 3.8 10*3/uL (ref 1.7–7.7)
Neutrophils Relative %: 30 %
Platelet Count: 172 10*3/uL (ref 150–400)
RBC: 5.17 MIL/uL (ref 4.22–5.81)
RDW: 14.8 % (ref 11.5–15.5)
Smear Review: NORMAL
WBC Count: 12.7 10*3/uL — ABNORMAL HIGH (ref 4.0–10.5)
nRBC: 0 % (ref 0.0–0.2)

## 2023-11-14 LAB — CMP (CANCER CENTER ONLY)
ALT: 33 U/L (ref 0–44)
AST: 32 U/L (ref 15–41)
Albumin: 4.2 g/dL (ref 3.5–5.0)
Alkaline Phosphatase: 151 U/L — ABNORMAL HIGH (ref 38–126)
Anion gap: 7 (ref 5–15)
BUN: 42 mg/dL — ABNORMAL HIGH (ref 8–23)
CO2: 25 mmol/L (ref 22–32)
Calcium: 10.3 mg/dL (ref 8.9–10.3)
Chloride: 107 mmol/L (ref 98–111)
Creatinine: 1.71 mg/dL — ABNORMAL HIGH (ref 0.61–1.24)
GFR, Estimated: 41 mL/min — ABNORMAL LOW (ref >60)
Glucose, Bld: 109 mg/dL — ABNORMAL HIGH (ref 70–99)
Potassium: 3.9 mmol/L (ref 3.5–5.1)
Sodium: 139 mmol/L (ref 135–145)
Total Bilirubin: 0.6 mg/dL (ref <1.2)
Total Protein: 7 g/dL (ref 6.5–8.1)

## 2023-11-15 ENCOUNTER — Ambulatory Visit
Admission: RE | Admit: 2023-11-15 | Discharge: 2023-11-15 | Disposition: A | Discharge status: Home / Self Care | Payer: Medicare PPO | Source: Ambulatory Visit | Attending: Internal Medicine | Admitting: Internal Medicine

## 2023-11-15 DIAGNOSIS — C3490 Malignant neoplasm of unspecified part of unspecified bronchus or lung: Secondary | ICD-10-CM | POA: Diagnosis not present

## 2023-11-15 DIAGNOSIS — R599 Enlarged lymph nodes, unspecified: Secondary | ICD-10-CM | POA: Diagnosis not present

## 2023-11-20 ENCOUNTER — Ambulatory Visit: Payer: Medicare PPO | Admitting: Internal Medicine

## 2023-11-28 ENCOUNTER — Inpatient Hospital Stay: Payer: Medicare PPO | Attending: Internal Medicine | Admitting: Internal Medicine

## 2023-11-28 VITALS — BP 123/63 | HR 80 | Temp 98.0°F | Resp 16 | Ht 66.0 in | Wt 176.7 lb

## 2023-11-28 DIAGNOSIS — Z860100 Personal history of colon polyps, unspecified: Secondary | ICD-10-CM | POA: Diagnosis not present

## 2023-11-28 DIAGNOSIS — I10 Essential (primary) hypertension: Secondary | ICD-10-CM | POA: Diagnosis not present

## 2023-11-28 DIAGNOSIS — Z9049 Acquired absence of other specified parts of digestive tract: Secondary | ICD-10-CM | POA: Insufficient documentation

## 2023-11-28 DIAGNOSIS — D090 Carcinoma in situ of bladder: Secondary | ICD-10-CM | POA: Insufficient documentation

## 2023-11-28 DIAGNOSIS — Z7902 Long term (current) use of antithrombotics/antiplatelets: Secondary | ICD-10-CM | POA: Insufficient documentation

## 2023-11-28 DIAGNOSIS — C9110 Chronic lymphocytic leukemia of B-cell type not having achieved remission: Secondary | ICD-10-CM | POA: Diagnosis not present

## 2023-11-28 DIAGNOSIS — C3412 Malignant neoplasm of upper lobe, left bronchus or lung: Secondary | ICD-10-CM | POA: Insufficient documentation

## 2023-11-28 DIAGNOSIS — Z9089 Acquired absence of other organs: Secondary | ICD-10-CM | POA: Diagnosis not present

## 2023-11-28 DIAGNOSIS — Z85528 Personal history of other malignant neoplasm of kidney: Secondary | ICD-10-CM | POA: Diagnosis not present

## 2023-11-28 DIAGNOSIS — Z882 Allergy status to sulfonamides status: Secondary | ICD-10-CM | POA: Insufficient documentation

## 2023-11-28 DIAGNOSIS — J984 Other disorders of lung: Secondary | ICD-10-CM | POA: Diagnosis not present

## 2023-11-28 DIAGNOSIS — I700 Atherosclerosis of aorta: Secondary | ICD-10-CM | POA: Diagnosis not present

## 2023-11-28 DIAGNOSIS — Z885 Allergy status to narcotic agent status: Secondary | ICD-10-CM | POA: Diagnosis not present

## 2023-11-28 DIAGNOSIS — I739 Peripheral vascular disease, unspecified: Secondary | ICD-10-CM | POA: Insufficient documentation

## 2023-11-28 DIAGNOSIS — Z79899 Other long term (current) drug therapy: Secondary | ICD-10-CM | POA: Insufficient documentation

## 2023-11-28 DIAGNOSIS — C3490 Malignant neoplasm of unspecified part of unspecified bronchus or lung: Secondary | ICD-10-CM

## 2023-11-28 NOTE — Progress Notes (Signed)
[facility] [phone]   [phone]  OFFICE PROGRESS NOTE  Bradley Quitter, MD [address] 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  PRIOR THERAPY: Status post left upper lobe wedge resection with lymph node sampling.  CURRENT THERAPY: Observation.  INTERVAL HISTORY: Bradley Irwin 77 y.o. male returns to the clinic today for follow-up visit accompanied by his wife.Discussed the use of AI scribe software for clinical note transcription with the patient, who gave verbal consent to proceed.  History of Present Illness   Bradley Irwin, a 77 year old with a history of stage 1A non-small cell lung cancer and small lymphocytic lymphoma, underwent a wedge resection of the left upper lobe of the lung in June 2020. Since the surgery, the patient has been under close observation due to the presence of an area of concern in the left lung. The most recent scan, conducted three months prior, showed stability in the size of this area, which is located close to the resection site.  In addition to the lung cancer, the patient has been diagnosed with bladder cancer, specifically carcinoma in situ. He has undergone one cystoscopy in the office and two cystoscopies in the hospital.  The patient reports no problems concerning the lungs. However, there have been changes in his medication regimen. He has been advised to discontinue Plavix and Norco. Additionally, the dosage of his thyroid medication has been reduced, though the exact new dosage is unclear. The patient's family doctor is managing this adjustment.       MEDICAL HISTORY: Past Medical History:  Diagnosis Date   Anxiety    Atypical mole 06/08/2010   Right Abdomen(moderate) Nino Glow)   Atypical mole 04/25/1995   Left  Upper ShoulderBlade(slight)   Atypical mole 12/31/2013   Left Mid Back(Moderate) Nino Glow)   Bladder cancer (HCC)    CLL (chronic lymphocytic leukemia) (HCC)    Colon polyps    Coronary artery disease    Eczema    GERD (gastroesophageal reflux disease)    Gout    Hearing loss    Hx of cardiovascular stress test    Lexiscan Myoview 5/16:  The study is normal. No ischemia identified. This is a low risk study. Overall left ventricular systolic function was normal. LV cavity size is normal. The left ventricular ejection fraction is normal (55-65%). Sensitivity reduced by bowel loop attenuation artifact.   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  MEDICATIONS:  Current Outpatient Medications  Medication Sig Dispense Refill   acetaminophen (TYLENOL) 500 MG tablet Take 500 mg by mouth at bedtime.     alfuzosin (UROXATRAL) 10 MG 24 hr tablet Take 10 mg by mouth daily.     allopurinol (ZYLOPRIM) 100 MG tablet Take 100 mg by  mouth daily.     ALPRAZolam (XANAX) 0.5 MG tablet Take 0.5 mg by mouth 2 (two) times daily as needed for anxiety.      amitriptyline (ELAVIL) 10 MG tablet Take 10 mg by mouth at bedtime as needed.     aspirin EC 81 MG tablet Take 81 mg by mouth daily. Swallow whole.     clidinium-chlordiazePOXIDE (LIBRAX) 5-2.5 MG capsule Take 1 capsule by mouth daily as needed (ibs).     clopidogrel (PLAVIX) 75 MG tablet Take 1 tablet (75 mg total) by mouth daily.     Colchicine 0.6 MG CAPS Take 1 tablet by mouth as needed (gout).     Cranberry-Vitamin C-Probiotic (AZO CRANBERRY  PO) Take 2 tablets by mouth daily.     diclofenac Sodium (VOLTAREN) 1 % GEL Apply 1 Application topically 4 (four) times daily as needed (pain).     dicyclomine (BENTYL) 20 MG tablet Take 20 mg by mouth daily as needed for spasms.     Docosanol (ABREVA) 10 % CREA Apply 1 Application topically daily as needed (fever blisters).     Docusate Sodium (DSS) 100 MG CAPS Take 100 mg by mouth 2 (two) times daily as needed (constipation).     ferrous sulfate 325 (65 FE) MG tablet Take 325 mg by mouth daily.     fluticasone (FLONASE) 50 MCG/ACT nasal spray Place 1 spray into both nostrils daily.     HYDROcodone-acetaminophen (NORCO) 5-325 MG tablet Take 1 tablet by mouth every 6 (six) hours as needed for moderate pain. (Patient not taking: Reported on 10/24/2023) 15 tablet 0   hydrOXYzine (ATARAX) 25 MG tablet Take 25 mg by mouth as needed for itching.     levothyroxine (SYNTHROID) 150 MCG tablet Take 150 mcg by mouth daily before breakfast.     Methenamine-Sodium Salicylate (AZO URINARY TRACT DEFENSE) 162-162.5 MG TABS Take 1 tablet by mouth as needed (urinary pain).     nitroGLYCERIN (NITROSTAT) 0.4 MG SL tablet Place 0.4 mg under the tongue every 5 (five) minutes as needed for chest pain.     omeprazole (PRILOSEC) 20 MG capsule Take 20 mg by mouth 2 (two) times daily.      oxybutynin (DITROPAN) 5 MG tablet Take 1 tablet (5 mg total) by mouth 2 (two) times daily as needed for bladder spasms. 15 tablet 1   polyethylene glycol (MIRALAX / GLYCOLAX) 17 g packet Take 17 g by mouth daily.     Probiotic Product (PROBIOTIC PO) Take 1 capsule by mouth daily.     rosuvastatin (CRESTOR) 10 MG tablet Take 10 mg by mouth daily.     simethicone (MYLICON) 125 MG chewable tablet Chew 125 mg by mouth every 6 (six) hours as needed for flatulence.     Vibegron (GEMTESA) 75 MG TABS Take 1 tablet by mouth daily.     No current facility-administered medications for this visit.    SURGICAL HISTORY:  Past Surgical History:   Procedure Laterality Date   CARDIAC CATHETERIZATION     CHOLECYSTECTOMY  10/26/2011   COLONOSCOPY     CORONARY STENT PLACEMENT  2006   CYSTOSCOPY W/ RETROGRADES Left 10/02/2023   Procedure: CYSTOSCOPY WITH RETROGRADE PYELOGRAM;  Surgeon: Jerilee Field, MD;  Location: [facility];  Service: Urology;  Laterality: Left;  60 MINS FOR CASE   CYSTOSCOPY WITH BIOPSY N/A 10/02/2023   Procedure: CYSTOSCOPY WITH BLADDER BIOPSY FULGURATION;  Surgeon: Jerilee Field, MD;  Location: [facility];  Service: Urology;  Laterality: N/A;   CYSTOSCOPY  WITH BIOPSY Left 10/30/2023   Procedure: CYSTOSCOPY WITH RETROROGRADE;  Surgeon: Jerilee Field, MD;  Location[facility];  Service: Urology;  Laterality: Left;   CYSTOSCOPY WITH URETEROSCOPY AND STENT PLACEMENT Left 10/30/2023   Procedure: CYSTOSCOPY WITH  LEFT URETEROSCOPY AND LEFT URETERAL STENT EXCHANGE;  Surgeon: Jerilee Field, MD;  Location: [facility];  Service: Urology;  Laterality: Left;  75 MINUTES   CYSTOSCOPY/URETEROSCOPY/HOLMIUM LASER/STENT PLACEMENT Left 10/02/2023   Procedure: CYSTOSCOPY, LEFT DIAGNOSTIC URETEROSCOPY/STENT PLACEMENT;  Surgeon: Jerilee Field, MD;  Location: [facility];  Service: Urology;  Laterality: Left;   FINGER SURGERY Right 77 y/o   cut index rt   INTERCOSTAL NERVE BLOCK  06/16/2019   Procedure: Intercostal Nerve Block;  Surgeon: Delight Ovens, MD;  Location[facility];  Service: Thoracic;;   LIPOMA EXCISION Left 11/18/2013   Procedure: EXCISION POSTERIOR NECK LIPOMA ;  Surgeon: Almond Lint, MD;  Location: [facility];  Service: General;  Laterality: Left;   LIVER BIOPSY  09/18/2011   LIVER LOBECTOMY  10/26/2011   LYMPH NODE DISSECTION  06/16/2019   Procedure: Lymph Node Dissection;  Surgeon: Delight Ovens, MD;  Location: [facility] OR;  Service: Thoracic;;   NEPHRECTOMY Right 2008   right   TONSILLECTOMY     VIDEO ASSISTED THORACOSCOPY (VATS)/WEDGE RESECTION Left 06/16/2019    Procedure: VIDEO ASSISTED THORACOSCOPY (VATS)/MINI THOROCOTOMY WITH LEFT UPPER LOBE WEDGE RESECTION;  Surgeon: Delight Ovens, MD;  Location: [facility];  Service: Thoracic;  Laterality: Left;   VIDEO BRONCHOSCOPY N/A 06/16/2019   Procedure: VIDEO BRONCHOSCOPY;  Surgeon: Delight Ovens, MD;  Location: [facility];  Service: Thoracic;  Laterality: N/A;    REVIEW OF SYSTEMS:  Constitutional: positive for fatigue Eyes: negative Ears, nose, mouth, throat, and face: negative Respiratory: negative Cardiovascular: negative Gastrointestinal: negative Genitourinary:negative Integument/breast: negative Hematologic/lymphatic: negative Musculoskeletal:negative Neurological: negative Behavioral/Psych: negative Endocrine: negative Allergic/Immunologic: negative   PHYSICAL EXAMINATION: General appearance: alert, cooperative, and no distress Head: Normocephalic, without obvious abnormality, atraumatic Neck: no adenopathy, no JVD, supple, symmetrical, trachea midline, and thyroid not enlarged, symmetric, no tenderness/mass/nodules Lymph nodes: Cervical, supraclavicular, and axillary nodes normal. Resp: clear to auscultation bilaterally Back: symmetric, no curvature. ROM normal. No CVA tenderness. Cardio: regular rate and rhythm, S1, S2 normal, no murmur, click, rub or gallop GI: soft, non-tender; bowel sounds normal; no masses,  no organomegaly Extremities: extremities normal, atraumatic, no cyanosis or edema Neurologic: Alert and oriented X 3, normal strength and tone. Normal symmetric reflexes. Normal coordination and gait  ECOG PERFORMANCE STATUS: 1 - Symptomatic but completely ambulatory  Blood pressure 123/63, pulse 80, temperature 98 F (36.7 C), temperature source Temporal, resp. rate 16, height 5\' 6"  (1.676 m), weight 176 lb 11.2 oz (80.2 kg), SpO2 100%.  LABORATORY DATA: Lab Results  Component Value Date   WBC 12.7 (H) 11/14/2023   HGB 13.9 11/14/2023   HCT 42.9 11/14/2023   MCV 83.0  11/14/2023   PLT 172 11/14/2023      Chemistry      Component Value Date/Time   NA 139 11/14/2023 1044   K 3.9 11/14/2023 1044   CL 107 11/14/2023 1044   CO2 25 11/14/2023 1044   BUN 42 (H) 11/14/2023 1044   CREATININE 1.71 (H) 11/14/2023 1044   CREATININE 1.30 (H) 02/06/2016 1714      Component Value Date/Time   CALCIUM 10.3 11/14/2023 1044   ALKPHOS 151 (H) 11/14/2023 1044   AST 32 11/14/2023 1044   ALT 33 11/14/2023  1044   BILITOT 0.6 11/14/2023 1044       RADIOGRAPHIC STUDIES: CT Chest Wo Contrast  Result Date: 11/28/2023 CLINICAL DATA:  Non-small-cell lung cancer. Restaging. * Tracking Code: BO *. Personal history of small lymphocytic lymphoma. Personal history of bladder cancer. Personal history of kidney cancer. EXAM: CT CHEST WITHOUT CONTRAST TECHNIQUE: Multidetector CT imaging of the chest was performed following the standard protocol without IV contrast. RADIATION DOSE REDUCTION: This exam was performed according to the departmental dose-optimization program which includes automated exposure control, adjustment of the mA and/or kV according to patient size and/or use of iterative reconstruction technique. COMPARISON:  08/08/2023 FINDINGS: Cardiovascular: The heart size is normal. No substantial pericardial effusion. Coronary artery calcification is evident. Mild atherosclerotic calcification is noted in the wall of the thoracic aorta. Mediastinum/Nodes: Stable high right paratracheal node. Scattered small and upper normal mediastinal lymph nodes again noted. No evidence for gross hilar lymphadenopathy although assessment is limited by the lack of intravenous contrast on the current study. The esophagus has normal imaging features. Bulky axillary and subpectoral lymphadenopathy again noted. Index anterior left axillary node measured previously at 14 mm is 17 mm today on image 21/2. Index node described as subcarinal previously at 13 mm is 12 mm today on image 84/2. Index right  axillary node measures 19 mm short axis on 31/2 compared to 19 mm short axis when remeasured on the prior study. Lungs/Pleura: Paraspinal 14 mm ground-glass opacity in the right upper lobe (43/8) was 16 mm previously. No new suspicious pulmonary nodule or mass in the right lung. Stable surgical scarring in the left lung including 16 x 10 mm soft tissue nodular component adjacent to the staple line on 71/8, measured previously at 15 x 11 mm. No new suspicious pulmonary nodule or mass in the left lung. Upper Abdomen: Stable hypoattenuating lesion medial spleen, incompletely characterized but likely benign. Right kidney surgically absent although surgical bed is been incompletely visualized. Musculoskeletal: No worrisome lytic or sclerotic osseous abnormality. IMPRESSION: 1. Stable exam. No new or progressive findings in the chest. 2. Bulky axillary and subpectoral lymphadenopathy again noted. Index anterior left axillary node measured previously at 14 mm is 17 mm today. Remaining index nodes are similar to prior study. 3. Stable surgical scarring in the left lung including 16 x 10 mm soft tissue nodular component adjacent to the staple line. Given that this was noted to be progressive on the previous study, close continued follow-up warranted. 4. Stable 14 mm ground-glass opacity in the right upper lobe. 5.  Aortic Atherosclerosis (ICD10-I70.0). Electronically Signed   By: Kennith Center M.D.   On: 11/28/2023 09:13     ASSESSMENT AND PLAN: This is a very pleasant 77 years old white male with stage IA non-small cell lung cancer, adenocarcinoma with positive MET 14 splice mutation status post wedge resection of the left upper lobe with lymph node sampling in June 2020.  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 The patient is currently on observation and he is feeling fine with no concerning complaints except for the recent hematuria followed by urology. His last scan  showed no concerning findings for disease recurrence or metastasis but there was nodular thickening along the inferior margin of the left upper lobe surgical resection that is more prominent than before. The patient had repeat CT scan of the chest performed recently.  I personally and independently reviewed the scan images and discussed the result with the patient and his  wife. His scan showed stable findings with no new or progressive findings in the chest and there was stable surgical scarring in the left lung measuring 1.6 x 1.0 cm soft tissue nodular component adjacent to the staple line that need close monitoring.  He also has the stable bulky axillary and subpectoral lymphadenopathy consistent with his history of SLL.    Stage 1A Non-Small Cell Lung Cancer Diagnosed in June 2020. Underwent wedge resection of the left upper lobe. Recent scan showed a stable lesion near the resection site, with no increase in size but still concerning. - Order follow-up scan in three months at [facility] - Schedule follow-up appointment one week after the scan to review results  Bladder Cancer (Carcinoma in Situ) Recently diagnosed and under the care of Dr. Jerilee Field. Underwent one cystoscopy in the office and two in the hospital. Scheduled to start intravesical BCG treatments next week to stimulate the immune system to fight the superficial bladder tumor. - Proceed with intravesical BCG treatments for six weeks - Continue regular cystoscopies as scheduled by Dr. Hulen Shouts Lymphocytic Lymphoma Enlarged lymph nodes with no new symptoms or changes discussed during this visit. - Continue monitoring as per previous plan  General Health Maintenance Discontinued Plavix and Norco per his PCP instructions. Thyroid medication dosage adjusted by family doctor. - Continue current thyroid medication as prescribed by family doctor.   The patient was advised to call immediately if he has any  concerning symptoms in the interval. The patient voices understanding of current disease status and treatment options and is in agreement with the current care plan.  All questions were answered. The patient knows to call the clinic with any problems, questions or concerns. We can certainly see the patient much sooner if necessary. The total time spent in the appointment was 30 minutes.  Disclaimer: This note was dictated with voice recognition software. Similar sounding words can inadvertently be transcribed and may not be corrected upon review.

## 2023-12-06 DIAGNOSIS — C672 Malignant neoplasm of lateral wall of bladder: Secondary | ICD-10-CM | POA: Diagnosis not present

## 2023-12-13 DIAGNOSIS — C672 Malignant neoplasm of lateral wall of bladder: Secondary | ICD-10-CM | POA: Diagnosis not present

## 2023-12-20 DIAGNOSIS — Z5111 Encounter for antineoplastic chemotherapy: Secondary | ICD-10-CM | POA: Diagnosis not present

## 2023-12-20 DIAGNOSIS — C672 Malignant neoplasm of lateral wall of bladder: Secondary | ICD-10-CM | POA: Diagnosis not present

## 2023-12-27 DIAGNOSIS — C672 Malignant neoplasm of lateral wall of bladder: Secondary | ICD-10-CM | POA: Diagnosis not present

## 2024-01-03 DIAGNOSIS — Z5111 Encounter for antineoplastic chemotherapy: Secondary | ICD-10-CM | POA: Diagnosis not present

## 2024-01-03 DIAGNOSIS — C672 Malignant neoplasm of lateral wall of bladder: Secondary | ICD-10-CM | POA: Diagnosis not present

## 2024-01-10 DIAGNOSIS — C672 Malignant neoplasm of lateral wall of bladder: Secondary | ICD-10-CM | POA: Diagnosis not present

## 2024-01-21 DIAGNOSIS — N1832 Chronic kidney disease, stage 3b: Secondary | ICD-10-CM | POA: Diagnosis not present

## 2024-01-23 DIAGNOSIS — D492 Neoplasm of unspecified behavior of bone, soft tissue, and skin: Secondary | ICD-10-CM | POA: Diagnosis not present

## 2024-01-23 DIAGNOSIS — L570 Actinic keratosis: Secondary | ICD-10-CM | POA: Diagnosis not present

## 2024-01-23 DIAGNOSIS — L814 Other melanin hyperpigmentation: Secondary | ICD-10-CM | POA: Diagnosis not present

## 2024-01-23 DIAGNOSIS — R208 Other disturbances of skin sensation: Secondary | ICD-10-CM | POA: Diagnosis not present

## 2024-01-23 DIAGNOSIS — L2989 Other pruritus: Secondary | ICD-10-CM | POA: Diagnosis not present

## 2024-01-23 DIAGNOSIS — L538 Other specified erythematous conditions: Secondary | ICD-10-CM | POA: Diagnosis not present

## 2024-01-23 DIAGNOSIS — D225 Melanocytic nevi of trunk: Secondary | ICD-10-CM | POA: Diagnosis not present

## 2024-01-23 DIAGNOSIS — L820 Inflamed seborrheic keratosis: Secondary | ICD-10-CM | POA: Diagnosis not present

## 2024-01-23 DIAGNOSIS — L821 Other seborrheic keratosis: Secondary | ICD-10-CM | POA: Diagnosis not present

## 2024-01-24 ENCOUNTER — Encounter: Payer: Self-pay | Admitting: Internal Medicine

## 2024-01-29 DIAGNOSIS — N2581 Secondary hyperparathyroidism of renal origin: Secondary | ICD-10-CM | POA: Diagnosis not present

## 2024-01-29 DIAGNOSIS — C679 Malignant neoplasm of bladder, unspecified: Secondary | ICD-10-CM | POA: Diagnosis not present

## 2024-01-29 DIAGNOSIS — I129 Hypertensive chronic kidney disease with stage 1 through stage 4 chronic kidney disease, or unspecified chronic kidney disease: Secondary | ICD-10-CM | POA: Diagnosis not present

## 2024-01-29 DIAGNOSIS — R809 Proteinuria, unspecified: Secondary | ICD-10-CM | POA: Diagnosis not present

## 2024-01-29 DIAGNOSIS — C3490 Malignant neoplasm of unspecified part of unspecified bronchus or lung: Secondary | ICD-10-CM | POA: Diagnosis not present

## 2024-01-29 DIAGNOSIS — N1832 Chronic kidney disease, stage 3b: Secondary | ICD-10-CM | POA: Diagnosis not present

## 2024-01-31 DIAGNOSIS — C672 Malignant neoplasm of lateral wall of bladder: Secondary | ICD-10-CM | POA: Diagnosis not present

## 2024-02-05 ENCOUNTER — Encounter: Payer: Self-pay | Admitting: Internal Medicine

## 2024-02-14 DIAGNOSIS — N1831 Chronic kidney disease, stage 3a: Secondary | ICD-10-CM | POA: Diagnosis not present

## 2024-02-14 DIAGNOSIS — I700 Atherosclerosis of aorta: Secondary | ICD-10-CM | POA: Diagnosis not present

## 2024-02-14 DIAGNOSIS — J8410 Pulmonary fibrosis, unspecified: Secondary | ICD-10-CM | POA: Diagnosis not present

## 2024-02-14 DIAGNOSIS — I2510 Atherosclerotic heart disease of native coronary artery without angina pectoris: Secondary | ICD-10-CM | POA: Diagnosis not present

## 2024-02-14 DIAGNOSIS — R7303 Prediabetes: Secondary | ICD-10-CM | POA: Diagnosis not present

## 2024-02-14 DIAGNOSIS — C672 Malignant neoplasm of lateral wall of bladder: Secondary | ICD-10-CM | POA: Diagnosis not present

## 2024-02-14 DIAGNOSIS — C3492 Malignant neoplasm of unspecified part of left bronchus or lung: Secondary | ICD-10-CM | POA: Diagnosis not present

## 2024-02-14 DIAGNOSIS — I1310 Hypertensive heart and chronic kidney disease without heart failure, with stage 1 through stage 4 chronic kidney disease, or unspecified chronic kidney disease: Secondary | ICD-10-CM | POA: Diagnosis not present

## 2024-02-14 DIAGNOSIS — C8300 Small cell B-cell lymphoma, unspecified site: Secondary | ICD-10-CM | POA: Diagnosis not present

## 2024-02-18 ENCOUNTER — Ambulatory Visit: Payer: Medicare PPO | Attending: Cardiovascular Disease | Admitting: Cardiovascular Disease

## 2024-02-18 ENCOUNTER — Encounter: Payer: Self-pay | Admitting: Cardiovascular Disease

## 2024-02-18 VITALS — BP 136/82 | HR 103 | Ht 66.0 in | Wt 168.0 lb

## 2024-02-18 DIAGNOSIS — E7800 Pure hypercholesterolemia, unspecified: Secondary | ICD-10-CM

## 2024-02-18 DIAGNOSIS — I10 Essential (primary) hypertension: Secondary | ICD-10-CM | POA: Diagnosis not present

## 2024-02-18 DIAGNOSIS — I2510 Atherosclerotic heart disease of native coronary artery without angina pectoris: Secondary | ICD-10-CM | POA: Diagnosis not present

## 2024-02-18 NOTE — Progress Notes (Signed)
 Chief Complaint  Patient presents with   Follow-up    CAD   History of Present Illness: 78 yo male with history of CAD, HTN, HLD, renal cell carcinoma s/p right nephrectomy, lung cancer s/p resection, lymphocytic lymphoma/CLL, bladder cancer here today for cardiac follow up. Cardiac cath February 2006 with a 2.75 x 28 mm Costar study stent (DES) placed in the mid LAD. Last cath November 2006 with patent mid LAD stent, 40% proximal LAD stenosis, 80% small diagonal branch stenosis, minimal disease in the Circumflex and RCA. Echo 03/18/13 with normal LV function, mild LVH. Nuclear stress test May 2016 with no evidence of ischemia. Lung mass found in early 2020 and biopsy was c/w adenocarcinoma. He is now s/p left upper lobe lung resection. Nuclear stress test April 2020 without ischemia. He has been diagnosed with lymphocytic lymphoma/CLL and has been told that his disease is in remission. He is followed by Dr. Arbutus Ped. Echo February 2022 with LVEF=65-70%, mild LVH, no valve disease. He was diagnosed with bladder cancer in 2024.   He is here today for follow up. The patient denies any chest pain, dyspnea, palpitations, lower extremity edema, orthopnea, PND, dizziness, near syncope or syncope.   Primary Care Physician: [name], MD  Past Medical History:  Diagnosis Date   Anxiety    Atypical mole 06/08/2010   Right Abdomen(moderate) Nino Glow)   Atypical mole 04/25/1995   Left Upper ShoulderBlade(slight)   Atypical mole 12/31/2013   Left Mid Back(Moderate) Nino Glow)   Bladder cancer (HCC)    CLL (chronic lymphocytic leukemia) (HCC)    Colon polyps    Coronary artery disease    Eczema    GERD (gastroesophageal reflux disease)    Gout    Hearing loss    Hx of cardiovascular stress test    Lexiscan Myoview 5/16:  The study is normal. No ischemia identified. This is a low risk study. Overall left ventricular systolic function was normal. LV cavity size is normal. The left  ventricular ejection fraction is normal (55-65%). Sensitivity reduced by bowel loop attenuation artifact.   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   Pneumonia    Psoriasis    PVD (peripheral vascular disease) (HCC)    Renal cell cancer (HCC) 2007   Squamous cell carcinoma in situ    Wears glasses    Wears hearing aid    both ears    Past Surgical History:  Procedure Laterality Date   CARDIAC CATHETERIZATION     CHOLECYSTECTOMY  10/26/2011   COLONOSCOPY     CORONARY STENT PLACEMENT  2006   CYSTOSCOPY W/ RETROGRADES Left 10/02/2023   Procedure: CYSTOSCOPY WITH RETROGRADE PYELOGRAM;  Surgeon: Jerilee Field, MD;  Location: [facility];  Service: Urology;  Laterality: Left;  60 MINS FOR CASE   CYSTOSCOPY WITH BIOPSY N/A 10/02/2023   Procedure: CYSTOSCOPY WITH BLADDER BIOPSY FULGURATION;  Surgeon: Jerilee Field, MD;  Location: [facility];  Service: Urology;  Laterality: N/A;   CYSTOSCOPY WITH BIOPSY Left 10/30/2023   Procedure: CYSTOSCOPY WITH RETROROGRADE;  Surgeon: Jerilee Field, MD;  Location: [facility];  Service: Urology;  Laterality: Left;   CYSTOSCOPY WITH URETEROSCOPY AND STENT PLACEMENT Left 10/30/2023   Procedure: CYSTOSCOPY WITH  LEFT URETEROSCOPY AND LEFT URETERAL STENT EXCHANGE;  Surgeon: Jerilee Field, MD;  Location:  [facility];  Service: Urology;  Laterality: Left;  75 MINUTES   CYSTOSCOPY/URETEROSCOPY/HOLMIUM LASER/STENT PLACEMENT Left 10/02/2023   Procedure: CYSTOSCOPY, LEFT DIAGNOSTIC URETEROSCOPY/STENT PLACEMENT;  Surgeon: Jerilee Field, MD;  Location: [facility];  Service: Urology;  Laterality: Left;   FINGER SURGERY Right 78 y/o   cut index rt   INTERCOSTAL NERVE BLOCK  06/16/2019   Procedure: Intercostal Nerve Block;  Surgeon: Delight Ovens, MD;  Location: [facility] OR;  Service: Thoracic;;    LIPOMA EXCISION Left 11/18/2013   Procedure: EXCISION POSTERIOR NECK LIPOMA ;  Surgeon: Almond Lint, MD;  Location: [facility];  Service: General;  Laterality: Left;   LIVER BIOPSY  09/18/2011   LIVER LOBECTOMY  10/26/2011   LYMPH NODE DISSECTION  06/16/2019   Procedure: Lymph Node Dissection;  Surgeon: Delight Ovens, MD;  Location: [facility];  Service: Thoracic;;   NEPHRECTOMY Right 2008   right   TONSILLECTOMY     VIDEO ASSISTED THORACOSCOPY (VATS)/WEDGE RESECTION Left 06/16/2019   Procedure: VIDEO ASSISTED THORACOSCOPY (VATS)/MINI THOROCOTOMY WITH LEFT UPPER LOBE WEDGE RESECTION;  Surgeon: Delight Ovens, MD;  Location: [facility] OR;  Service: Thoracic;  Laterality: Left;   VIDEO BRONCHOSCOPY N/A 06/16/2019   Procedure: VIDEO BRONCHOSCOPY;  Surgeon: Delight Ovens, MD;  Location: [facility] OR;  Service: Thoracic;  Laterality: N/A;    Current Outpatient Medications  Medication Sig Dispense Refill   acetaminophen (TYLENOL) 500 MG tablet Take 500 mg by mouth at bedtime.     alfuzosin (UROXATRAL) 10 MG 24 hr tablet Take 10 mg by mouth daily.     allopurinol (ZYLOPRIM) 100 MG tablet Take 100 mg by mouth daily.     ALPRAZolam (XANAX) 0.5 MG tablet Take 0.5 mg by mouth 2 (two) times daily as needed for anxiety.      amitriptyline (ELAVIL) 10 MG tablet Take 10 mg by mouth at bedtime as needed.     aspirin EC 81 MG tablet Take 81 mg by mouth daily. Swallow whole.     clidinium-chlordiazePOXIDE (LIBRAX) 5-2.5 MG capsule Take 1 capsule by mouth daily as needed (ibs).     Colchicine 0.6 MG CAPS Take 1 tablet by mouth as needed (gout).     Cranberry-Vitamin C-Probiotic (AZO CRANBERRY PO) Take 2 tablets by mouth daily.     diclofenac Sodium (VOLTAREN) 1 % GEL Apply 1 Application topically 4 (four) times daily as needed (pain).     dicyclomine (BENTYL) 20 MG tablet Take 20 mg by mouth daily as needed for spasms.     Docosanol (ABREVA) 10 % CREA Apply 1 Application topically daily as needed  (fever blisters).     Docusate Sodium (DSS) 100 MG CAPS Take 100 mg by mouth 2 (two) times daily as needed (constipation).     fluticasone (FLONASE) 50 MCG/ACT nasal spray Place 1 spray into both nostrils daily.     hydrOXYzine (ATARAX) 25 MG tablet Take 25 mg by mouth as needed for itching.     levothyroxine (SYNTHROID) 150 MCG tablet Take 150 mcg by mouth daily before breakfast.     Methenamine-Sodium Salicylate (AZO URINARY TRACT DEFENSE) 162-162.5 MG TABS Take 1 tablet by mouth as needed (urinary pain).     nitroGLYCERIN (NITROSTAT) 0.4 MG SL tablet Place 0.4 mg under the tongue every 5 (five) minutes as needed for chest pain.     omeprazole (PRILOSEC) 20 MG capsule Take 20 mg by mouth 2 (two) times daily.      oxybutynin (DITROPAN)  5 MG tablet Take 1 tablet (5 mg total) by mouth 2 (two) times daily as needed for bladder spasms. 15 tablet 1   polyethylene glycol (MIRALAX / GLYCOLAX) 17 g packet Take 17 g by mouth daily.     Probiotic Product (PROBIOTIC PO) Take 1 capsule by mouth daily.     rosuvastatin (CRESTOR) 10 MG tablet Take 10 mg by mouth daily.     simethicone (MYLICON) 125 MG chewable tablet Chew 125 mg by mouth every 6 (six) hours as needed for flatulence.     Vibegron (GEMTESA) 75 MG TABS Take 1 tablet by mouth daily.     ferrous sulfate 325 (65 FE) MG tablet Take 325 mg by mouth daily. (Patient not taking: Reported on 02/18/2024)     No current facility-administered medications for this visit.    Allergies  Allergen Reactions   Sulfa Antibiotics Other (See Comments)    Happened when he was child. Does not remember reaction.   Demerol Other (See Comments)    Severe headache.   Pseudoephedrine Other (See Comments)    Hypertension     Social History   Socioeconomic History   Marital status: Married    Spouse name: Not on file   Number of children: 0   Years of education: Not on file   Highest education level: Not on file  Occupational History   Occupation: Retired  from state of [place]  Tobacco Use   Smoking status: Former    Current packs/day: 0.00    Average packs/day: 1 pack/day for 15.0 years (15.0 ttl pk-yrs)    Types: Cigarettes    Start date: 10/25/1968    Quit date: 10/26/1983    Years since quitting: 40.3   Smokeless tobacco: Never  Vaping Use   Vaping status: Never Used  Substance and Sexual Activity   Alcohol use: Not Currently    Alcohol/week: 14.0 - 20.0 standard drinks of alcohol    Types: 14 - 20 Glasses of wine per week   Drug use: No   Sexual activity: Not Currently  Other Topics Concern   Not on file  Social History Narrative   Not on file   Social Drivers of Health   Financial Resource Strain: Not on file  Food Insecurity: Not on file  Transportation Needs: Not on file  Physical Activity: Not on file  Stress: Not on file  Social Connections: Not on file  Intimate Partner Violence: Not on file    Family History  Problem Relation Age of Onset   Hypertension Mother    Heart failure Mother    Heart disease Mother    Aneurysm Mother        brain   Peripheral vascular disease Father    Lung cancer Father    Hypertension Sister    Breast cancer Sister    Heart attack Maternal Grandfather    Heart attack Maternal Uncle    Colon cancer Neg Hx     Review of Systems:  As stated in the HPI and otherwise negative.   BP 136/82   Pulse (!) 103   Ht 5\' 6"  (1.676 m)   Wt 76.2 kg   SpO2 95%   BMI 27.12 kg/m   Physical Examination:  General: Well developed, well nourished, NAD  HEENT: OP clear, mucus membranes moist  SKIN: warm, dry. No rashes. Neuro: No focal deficits  Musculoskeletal: Muscle strength 5/5 all ext  Psychiatric: Mood and affect normal  Neck: No JVD, no carotid bruits,  no thyromegaly, no lymphadenopathy.  Lungs:Clear bilaterally, no wheezes, rhonci, crackles Cardiovascular: Regular rate and rhythm. No murmurs, gallops or rubs. Abdomen:Soft. Bowel sounds present. Non-tender.  Extremities: No lower  extremity edema. Pulses are 2 + in the bilateral DP/PT.  EKG:  EKG is ordered today.  The EKG is reviewed by me and shows  EKG Interpretation Date/Time:  Monday February 18 2024 15:00:31 EST Ventricular Rate:  99 PR Interval:  186 QRS Duration:  114 QT Interval:  358 QTC Calculation: 459 R Axis:   -35  Text Interpretation: Normal sinus rhythm Left axis deviation Incomplete left bundle branch block Left ventricular hypertrophy with repolarization abnormality ( R in aVL , Cornell product ) Confirmed by Verne Carrow [phone]) on 02/18/2024 3:04:50 PM   Recent Labs: 11/14/2023: ALT 33; BUN 42; Creatinine 1.71; Hemoglobin 13.9; Platelet Count 172; Potassium 3.9; Sodium 139   Lipid Panel Followed in primary care   Wt Readings from Last 3 Encounters:  02/18/24 76.2 kg  11/28/23 80.2 kg  10/30/23 79.8 kg    Assessment and Plan:   1. CAD without angina: No chest pain. Will continue ASA, He had been on Plavix given his first generation DES but he has had hematuria and this was stopped. Continue statin. LV function normal by echo February 2022.    2. HTN: BP is normal. He is not on any anti-hypertensive therapy.   3. HLD: Lipids followed in primary care. LDL 57 in October 2024. Continue statin.    Labs/ tests ordered today include:  Orders Placed This Encounter  Procedures   EKG 12-Lead   Disposition:   F/U with me in 12  months  Signed, Verne Carrow, MD 02/18/2024 3:54 PM    [facility] [address] [place], [place], [place]  [address] Phone: [phone]; Fax: [phone]

## 2024-02-18 NOTE — Patient Instructions (Signed)
Medication Instructions:  No changes *If you need a refill on your cardiac medications before your next appointment, please call your pharmacy*   Lab Work: none If you have labs (blood work) drawn today and your tests are completely normal, you will receive your results only by: [place] (if you have MyChart) OR A paper copy in the mail If you have any lab test that is abnormal or we need to change your treatment, we will call you to review the results.   Testing/Procedures: none   Follow-Up: At [facility], you and your health needs are our priority.  As part of our continuing mission to provide you with exceptional heart care, we have created designated Provider Care Teams.  These Care Teams include your primary Cardiologist (physician) and Advanced Practice Providers (APPs -  Physician Assistants and Nurse Practitioners) who all work together to provide you with the care you need, when you need it.   Your next appointment:   12 month(s)  Provider:   [name], MD     Other Instructions

## 2024-02-19 ENCOUNTER — Inpatient Hospital Stay: Payer: Medicare PPO | Attending: Internal Medicine

## 2024-02-19 DIAGNOSIS — C3490 Malignant neoplasm of unspecified part of unspecified bronchus or lung: Secondary | ICD-10-CM

## 2024-02-19 DIAGNOSIS — C3412 Malignant neoplasm of upper lobe, left bronchus or lung: Secondary | ICD-10-CM | POA: Diagnosis not present

## 2024-02-19 DIAGNOSIS — Z79899 Other long term (current) drug therapy: Secondary | ICD-10-CM | POA: Diagnosis not present

## 2024-02-19 DIAGNOSIS — C9110 Chronic lymphocytic leukemia of B-cell type not having achieved remission: Secondary | ICD-10-CM | POA: Insufficient documentation

## 2024-02-19 LAB — CMP (CANCER CENTER ONLY)
ALT: 13 U/L (ref 0–44)
AST: 18 U/L (ref 15–41)
Albumin: 3.8 g/dL (ref 3.5–5.0)
Alkaline Phosphatase: 144 U/L — ABNORMAL HIGH (ref 38–126)
Anion gap: 9 (ref 5–15)
BUN: 35 mg/dL — ABNORMAL HIGH (ref 8–23)
CO2: 24 mmol/L (ref 22–32)
Calcium: 10.3 mg/dL (ref 8.9–10.3)
Chloride: 106 mmol/L (ref 98–111)
Creatinine: 1.9 mg/dL — ABNORMAL HIGH (ref 0.61–1.24)
GFR, Estimated: 36 mL/min — ABNORMAL LOW (ref >60)
Glucose, Bld: 108 mg/dL — ABNORMAL HIGH (ref 70–99)
Potassium: 4.1 mmol/L (ref 3.5–5.1)
Sodium: 139 mmol/L (ref 135–145)
Total Bilirubin: 0.5 mg/dL (ref 0.0–1.2)
Total Protein: 7 g/dL (ref 6.5–8.1)

## 2024-02-19 LAB — CBC WITH DIFFERENTIAL (CANCER CENTER ONLY)
Abs Immature Granulocytes: 0.02 10*3/uL (ref 0.00–0.07)
Basophils Absolute: 0 10*3/uL (ref 0.0–0.1)
Basophils Relative: 0 %
Eosinophils Absolute: 0.1 10*3/uL (ref 0.0–0.5)
Eosinophils Relative: 1 %
HCT: 40.5 % (ref 39.0–52.0)
Hemoglobin: 12.7 g/dL — ABNORMAL LOW (ref 13.0–17.0)
Immature Granulocytes: 0 %
Lymphocytes Relative: 63 %
Lymphs Abs: 8.3 10*3/uL — ABNORMAL HIGH (ref 0.7–4.0)
MCH: 25.6 pg — ABNORMAL LOW (ref 26.0–34.0)
MCHC: 31.4 g/dL (ref 30.0–36.0)
MCV: 81.5 fL (ref 80.0–100.0)
Monocytes Absolute: 0.3 10*3/uL (ref 0.1–1.0)
Monocytes Relative: 2 %
Neutro Abs: 4.5 10*3/uL (ref 1.7–7.7)
Neutrophils Relative %: 34 %
Platelet Count: 207 10*3/uL (ref 150–400)
RBC: 4.97 MIL/uL (ref 4.22–5.81)
RDW: 14.8 % (ref 11.5–15.5)
Smear Review: NORMAL
WBC Count: 13.2 10*3/uL — ABNORMAL HIGH (ref 4.0–10.5)
nRBC: 0 % (ref 0.0–0.2)

## 2024-02-20 ENCOUNTER — Ambulatory Visit
Admission: RE | Admit: 2024-02-20 | Discharge: 2024-02-20 | Disposition: A | Discharge status: Home / Self Care | Payer: Medicare PPO | Source: Ambulatory Visit | Attending: Internal Medicine | Admitting: Internal Medicine

## 2024-02-20 DIAGNOSIS — J479 Bronchiectasis, uncomplicated: Secondary | ICD-10-CM | POA: Diagnosis not present

## 2024-02-20 DIAGNOSIS — C3490 Malignant neoplasm of unspecified part of unspecified bronchus or lung: Secondary | ICD-10-CM

## 2024-02-20 DIAGNOSIS — R911 Solitary pulmonary nodule: Secondary | ICD-10-CM | POA: Diagnosis not present

## 2024-02-20 DIAGNOSIS — R599 Enlarged lymph nodes, unspecified: Secondary | ICD-10-CM | POA: Diagnosis not present

## 2024-02-29 NOTE — Progress Notes (Signed)
 [facility] OFFICE PROGRESS NOTE  [name], MD [address] 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 3) bladder cancer in situ, followed by Dr. Mena Goes.  Status post 6 weeks of BCG treatment. Diagnosed in November 2024.   PRIOR THERAPY:  1) Status post left upper lobe wedge resection with lymph node sampling.  2) Status post 6 weeks of intravesical BCG treatment. Diagnosed in November 2024. Completed in January 2025  CURRENT THERAPY: Observation   INTERVAL HISTORY: Bradley Irwin 78 y.o. male returns to the clinic today for a follow-up visit.  The patient was last seen in the clinic by Dr. Arbutus Ped on 11/28/23.  The patient is followed for his history of small lymphocytic lymphoma as well as a stage I non-small cell lung cancer, status post wedge resection of the left upper lobe in June 2020.  The patient is currently on observation.  Dr. Arbutus Ped is watching him closely with surveillance imaging every 3 months to monitor the resection margin.   Additionally, the patient is following with Dr. Mena Goes because he was diagnosed with bladder cancer in situ.  The most recent cystoscopy was last month. His next cystoscopy April 14th, 2025. He experiences muscle spasms and difficult initiating a urinary stream. He is on several medications for this including Cialis, gemtesa, and oxycutynin.   Otherwise the patient denies any recent fever, chills, night sweats, or unexplained weight loss. He has baseline dyspnea on exertion secondary to his heart condition per patient report.  Denies any chest pain, shortness of breath, cough, or hemoptysis.  Denies any nausea, vomiting, or diarrhea. He struggles with constipation and takes miralax and stool softener. Denies any headache or visual changes.  Denies any  abdominal pain or unusual back pain.  Denies any hematuria, malodorous urine, or dysuria.  He recently had a restaging CT scan performed.  He is here today for evaluation to review his scan results.    MEDICAL HISTORY: Past Medical History:  Diagnosis Date   Anxiety    Atypical mole 06/08/2010   Right Abdomen(moderate) Nino Glow)   Atypical mole 04/25/1995   Left Upper ShoulderBlade(slight)   Atypical mole 12/31/2013   Left Mid Back(Moderate) Nino Glow)   Bladder cancer (HCC)    CLL (chronic lymphocytic leukemia) (HCC)    Colon polyps    Coronary artery disease    Eczema    GERD (gastroesophageal reflux disease)    Gout    Hearing loss    Hx of cardiovascular stress test    Lexiscan Myoview 5/16:  The study is normal. No ischemia identified. This is a low risk study. Overall left ventricular systolic function was normal. LV cavity size is normal. The left ventricular ejection fraction is normal (55-65%). Sensitivity reduced by bowel loop attenuation artifact.   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  MEDICATIONS:  Current Outpatient Medications  Medication Sig Dispense Refill   acetaminophen (TYLENOL) 500 MG tablet Take 500 mg by mouth at bedtime.     alfuzosin (UROXATRAL) 10 MG 24 hr tablet Take 10 mg by mouth daily.     allopurinol (ZYLOPRIM) 100 MG tablet Take 100 mg by mouth daily.     ALPRAZolam (XANAX) 0.5 MG tablet Take 0.5 mg by mouth 2 (two) times daily as needed for anxiety.      amitriptyline (ELAVIL) 10 MG tablet Take 10 mg by mouth at bedtime as  needed.     aspirin EC 81 MG tablet Take 81 mg by mouth daily. Swallow whole.     clidinium-chlordiazePOXIDE (LIBRAX) 5-2.5 MG capsule Take 1 capsule by mouth daily as needed (ibs).     Colchicine 0.6 MG CAPS Take 1 tablet by mouth as needed (gout).     Cranberry-Vitamin C-Probiotic (AZO CRANBERRY PO) Take 2 tablets by mouth daily.     diclofenac Sodium (VOLTAREN) 1 % GEL Apply 1 Application topically 4 (four) times daily as needed (pain).     dicyclomine (BENTYL) 20 MG tablet Take 20 mg by mouth daily as needed for spasms.     Docosanol (ABREVA) 10 % CREA Apply 1 Application topically daily as needed (fever blisters).     Docusate Sodium (DSS) 100 MG CAPS Take 100 mg by mouth 2 (two) times daily as needed (constipation).     ferrous sulfate 325 (65 FE) MG tablet Take 325 mg by mouth daily. (Patient not taking: Reported on 02/18/2024)     fluticasone (FLONASE) 50 MCG/ACT nasal spray Place 1 spray into both nostrils daily.     hydrOXYzine (ATARAX) 25 MG tablet Take 25 mg by mouth as needed for itching.     levothyroxine (SYNTHROID) 150 MCG tablet Take 150 mcg by mouth daily before breakfast.     Methenamine-Sodium Salicylate (AZO URINARY TRACT DEFENSE) 162-162.5 MG TABS Take 1 tablet by mouth as needed (urinary pain).     nitroGLYCERIN (NITROSTAT) 0.4 MG SL tablet Place 0.4 mg under the tongue every 5 (five) minutes as needed for chest pain.     omeprazole (PRILOSEC) 20 MG capsule Take 20 mg by mouth 2 (two) times daily.      oxybutynin (DITROPAN) 5 MG tablet Take 1 tablet (5 mg total) by mouth 2 (two) times daily as needed for bladder spasms. 15 tablet 1   polyethylene glycol (MIRALAX / GLYCOLAX) 17 g packet Take 17 g by mouth daily.     Probiotic Product (PROBIOTIC PO) Take 1 capsule by mouth daily.     rosuvastatin (CRESTOR) 10 MG tablet Take 10 mg by mouth daily.     simethicone (MYLICON) 125 MG chewable tablet Chew 125 mg by mouth every 6 (six) hours as needed for flatulence.     Vibegron  (GEMTESA) 75 MG TABS Take 1 tablet by mouth daily.     No current facility-administered medications for this visit.    SURGICAL HISTORY:  Past Surgical History:  Procedure Laterality Date   CARDIAC CATHETERIZATION     CHOLECYSTECTOMY  10/26/2011   COLONOSCOPY     CORONARY STENT PLACEMENT  2006   CYSTOSCOPY W/ RETROGRADES Left 10/02/2023   Procedure: CYSTOSCOPY WITH RETROGRADE PYELOGRAM;  Surgeon: Jerilee Field, MD;  Location: [facility];  Service: Urology;  Laterality: Left;  60 MINS FOR CASE   CYSTOSCOPY WITH BIOPSY N/A 10/02/2023   Procedure:  CYSTOSCOPY WITH BLADDER BIOPSY FULGURATION;  Surgeon: Jerilee Field, MD;  Location: [facility];  Service: Urology;  Laterality: N/A;   CYSTOSCOPY WITH BIOPSY Left 10/30/2023   Procedure: CYSTOSCOPY WITH RETROROGRADE;  Surgeon: Jerilee Field, MD;  Location: [facility];  Service: Urology;  Laterality: Left;   CYSTOSCOPY WITH URETEROSCOPY AND STENT PLACEMENT Left 10/30/2023   Procedure: CYSTOSCOPY WITH  LEFT URETEROSCOPY AND LEFT URETERAL STENT EXCHANGE;  Surgeon: Jerilee Field, MD;  Location: [facility];  Service: Urology;  Laterality: Left;  75 MINUTES   CYSTOSCOPY/URETEROSCOPY/HOLMIUM LASER/STENT PLACEMENT Left 10/02/2023   Procedure: CYSTOSCOPY, LEFT DIAGNOSTIC URETEROSCOPY/STENT PLACEMENT;  Surgeon: Jerilee Field, MD;  Location: [facility];  Service: Urology;  Laterality: Left;   FINGER SURGERY Right 78 y/o   cut index rt   INTERCOSTAL NERVE BLOCK  06/16/2019   Procedure: Intercostal Nerve Block;  Surgeon: Delight Ovens, MD;  Location: [facility] OR;  Service: Thoracic;;   LIPOMA EXCISION Left 11/18/2013   Procedure: EXCISION POSTERIOR NECK LIPOMA ;  Surgeon: Almond Lint, MD;  Location: [facility];  Service: General;  Laterality: Left;   LIVER BIOPSY  09/18/2011   LIVER LOBECTOMY  10/26/2011   LYMPH NODE DISSECTION  06/16/2019   Procedure: Lymph Node Dissection;  Surgeon: Delight Ovens, MD;   Location: [facility] OR;  Service: Thoracic;;   NEPHRECTOMY Right 2008   right   TONSILLECTOMY     VIDEO ASSISTED THORACOSCOPY (VATS)/WEDGE RESECTION Left 06/16/2019   Procedure: VIDEO ASSISTED THORACOSCOPY (VATS)/MINI THOROCOTOMY WITH LEFT UPPER LOBE WEDGE RESECTION;  Surgeon: Delight Ovens, MD;  Location: [facility] OR;  Service: Thoracic;  Laterality: Left;   VIDEO BRONCHOSCOPY N/A 06/16/2019   Procedure: VIDEO BRONCHOSCOPY;  Surgeon: Delight Ovens, MD;  Location: [facility] OR;  Service: Thoracic;  Laterality: N/A;    REVIEW OF SYSTEMS:   Review of Systems  Constitutional: Negative for appetite change, chills, fatigue, fever and unexpected weight change.  HENT:   Negative for mouth sores, nosebleeds, sore throat and trouble swallowing.   Eyes: Negative for eye problems and icterus.  Respiratory: Positive for baseline dyspnea on exertion. Negative for cough, hemoptysis, and wheezing.   Cardiovascular: Negative for chest pain and leg swelling.  Gastrointestinal: Negative for abdominal pain, constipation, diarrhea, nausea and vomiting.  Genitourinary: Positive for difficulty urinating and bladder spasms. Negative for bladder incontinence, dysuria, frequency and hematuria.   Musculoskeletal: Negative for back pain, gait problem, neck pain and neck stiffness.  Skin: Negative for itching and rash.  Neurological: Negative for dizziness, extremity weakness, gait problem, headaches, light-headedness and seizures.  Hematological: Negative for adenopathy. Does not bruise/bleed easily.  Psychiatric/Behavioral: Negative for confusion, depression and sleep disturbance. The patient is not nervous/anxious.     PHYSICAL EXAMINATION:  There were no vitals taken for this visit.  ECOG PERFORMANCE STATUS: 1  Physical Exam  Constitutional: Oriented to person, place, and time and well-developed, well-nourished, and in no distress. HENT:  Head: Normocephalic and atraumatic.  Mouth/Throat: Oropharynx is clear and  moist. No oropharyngeal exudate.  Eyes: Conjunctivae are normal. Right eye exhibits no discharge. Left eye exhibits no discharge. No scleral icterus.  Neck: Normal range of motion. Neck supple.  Cardiovascular: Normal rate, regular rhythm, normal heart sounds and intact distal pulses.   Pulmonary/Chest: Effort normal and breath sounds normal. No respiratory distress. No wheezes. No rales.  Abdominal: Soft. Bowel sounds are normal. Exhibits no distension and no mass. There is no tenderness.  Musculoskeletal: Normal range of motion. Exhibits  no edema.  Lymphadenopathy:    No cervical adenopathy.  Neurological: Alert and oriented to person, place, and time. Exhibits normal muscle tone. Gait normal. Coordination normal.  Skin: Skin is warm and dry. No rash noted. Not diaphoretic. No erythema. No pallor.  Psychiatric: Mood, memory and judgment normal.  Vitals reviewed.  LABORATORY DATA: Lab Results  Component Value Date   WBC 13.2 (H) 02/19/2024   HGB 12.7 (L) 02/19/2024   HCT 40.5 02/19/2024   MCV 81.5 02/19/2024   PLT 207 02/19/2024      Chemistry      Component Value Date/Time   NA 139 02/19/2024 0933   K 4.1 02/19/2024 0933   CL 106 02/19/2024 0933   CO2 24 02/19/2024 0933   BUN 35 (H) 02/19/2024 0933   CREATININE 1.90 (H) 02/19/2024 0933   CREATININE 1.30 (H) 02/06/2016 1714      Component Value Date/Time   CALCIUM 10.3 02/19/2024 0933   ALKPHOS 144 (H) 02/19/2024 0933   AST 18 02/19/2024 0933   ALT 13 02/19/2024 0933   BILITOT 0.5 02/19/2024 0933       RADIOGRAPHIC STUDIES:  No results found.   ASSESSMENT/PLAN:  This is a very pleasant 78 year old Caucasian male with a history of stage I non-small cell lung cancer, adenocarcinoma.  He is Positive with met 14 splice mutation.  He status post wedge resection of the left upper lobe and lymph node sampling in June 2020.  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  He also was recently found to have bladder cancer in situ and he is following closely with Dr. Mena Goes.  Patient underwent 6 weeks of BCG treatment and undergoes cystoscopies. His next one is scheduled for 04/07/24  The patient was last seen by Dr. Arbutus Ped in December 2024 which did not show any findings for disease recurrence but there was some nodular thickening along the inferior margin of the left upper lobe surgical resection that is more prominent which Dr. Arbutus Ped is watching closely.   The patient recently had a restaging CT scan.  The patient was seen with Dr. Arbutus Ped.  Dr. Arbutus Ped personally and independently reviewed the scan and discussed results with the patient today.  The scan showed definite interval enlargement of the nodular soft tissue lesion adjacent to the left upper lobe staple line. Findings suggest recurrent tumor.    Dr. Arbutus Ped recommends a PET scan.  Will see him back for follow-up visit in 3 weeks to review the results and next steps.   He will continue to follow with urology regarding the bladder cancer and muscle spasms.   The patient was advised to call immediately if he has any concerning symptoms in the interval. The patient voices understanding of current disease status and treatment options and is in agreement with the current care plan. All questions were answered. The patient knows to call the clinic with any problems, questions or concerns. We can certainly see the patient much sooner if necessary   No orders of the defined types were placed in this encounter.    Katlin Bortner L Giliana Vantil, PA-C 02/29/24  ADDENDUM: Hematology/Oncology Attending: I had a face-to-face encounter with the patient today.  I reviewed his record, lab, scan and recommended his care plan.  This is a very pleasant 78 years old white male with history of a stage Ia non-small cell lung cancer with positive met exon 14 splice site mutation and PD-L1 expression  of 90%  diagnosed in June 2020 status post left upper lobe wedge resection with lymph node sampling.  The patient also has a history of small lymphocytic lymphoma as well as bladder cancer in situ treated with intravesical BCG and currently followed by Dr. Mena Goes from [facility]rology. The patient had repeat CT scan of the chest of the chest performed recently.  I personally independently reviewed the scan images and discussed the result with the patient and his wife. His scan showed definite interval enlargement of a nodular soft tissue lesion adjacent to the left upper lobe staple line concerning for recurrent tumor. He also has a stable appearing subsolid nodular opacity in the right upper lobe measuring 1.6 cm.  There was also stable mediastinal, hilar and axillary lymphadenopathy consistent with CLL. I recommended for the patient to have a PET scan for further evaluation of his disease and to rule out any disease recurrence. We will see him back for follow-up visit in 3 weeks for evaluation and discussion of the PET scan result and further recommendation regarding treatment of his condition. Regarding the bladder cancer and urinary tract infection, he will follow-up with his urologist Dr. Mena Goes. The patient was advised to call immediately if he has any other concerning symptoms in the interval. The total time spent in the appointment was 30 minutes. Disclaimer: This note was dictated with voice recognition software. Similar sounding words can inadvertently be transcribed and may be missed upon review. Lajuana Matte, MD

## 2024-03-05 ENCOUNTER — Ambulatory Visit: Payer: Medicare PPO | Admitting: Internal Medicine

## 2024-03-05 ENCOUNTER — Inpatient Hospital Stay: Payer: Medicare PPO | Attending: Internal Medicine | Admitting: Physician Assistant

## 2024-03-05 VITALS — BP 140/65 | HR 86 | Temp 98.0°F | Resp 16 | Wt 168.9 lb

## 2024-03-05 DIAGNOSIS — K589 Irritable bowel syndrome without diarrhea: Secondary | ICD-10-CM | POA: Insufficient documentation

## 2024-03-05 DIAGNOSIS — Z885 Allergy status to narcotic agent status: Secondary | ICD-10-CM | POA: Diagnosis not present

## 2024-03-05 DIAGNOSIS — M62838 Other muscle spasm: Secondary | ICD-10-CM | POA: Insufficient documentation

## 2024-03-05 DIAGNOSIS — I10 Essential (primary) hypertension: Secondary | ICD-10-CM | POA: Diagnosis not present

## 2024-03-05 DIAGNOSIS — C9110 Chronic lymphocytic leukemia of B-cell type not having achieved remission: Secondary | ICD-10-CM | POA: Diagnosis not present

## 2024-03-05 DIAGNOSIS — I2510 Atherosclerotic heart disease of native coronary artery without angina pectoris: Secondary | ICD-10-CM | POA: Insufficient documentation

## 2024-03-05 DIAGNOSIS — R0609 Other forms of dyspnea: Secondary | ICD-10-CM | POA: Diagnosis not present

## 2024-03-05 DIAGNOSIS — Z79890 Hormone replacement therapy (postmenopausal): Secondary | ICD-10-CM | POA: Insufficient documentation

## 2024-03-05 DIAGNOSIS — I739 Peripheral vascular disease, unspecified: Secondary | ICD-10-CM | POA: Insufficient documentation

## 2024-03-05 DIAGNOSIS — Z79899 Other long term (current) drug therapy: Secondary | ICD-10-CM | POA: Insufficient documentation

## 2024-03-05 DIAGNOSIS — Z860100 Personal history of colon polyps, unspecified: Secondary | ICD-10-CM | POA: Diagnosis not present

## 2024-03-05 DIAGNOSIS — Z8551 Personal history of malignant neoplasm of bladder: Secondary | ICD-10-CM | POA: Insufficient documentation

## 2024-03-05 DIAGNOSIS — C3490 Malignant neoplasm of unspecified part of unspecified bronchus or lung: Secondary | ICD-10-CM | POA: Diagnosis not present

## 2024-03-05 DIAGNOSIS — Z9049 Acquired absence of other specified parts of digestive tract: Secondary | ICD-10-CM | POA: Diagnosis not present

## 2024-03-05 DIAGNOSIS — Z882 Allergy status to sulfonamides status: Secondary | ICD-10-CM | POA: Diagnosis not present

## 2024-03-05 DIAGNOSIS — N390 Urinary tract infection, site not specified: Secondary | ICD-10-CM | POA: Insufficient documentation

## 2024-03-05 DIAGNOSIS — Z9089 Acquired absence of other organs: Secondary | ICD-10-CM | POA: Insufficient documentation

## 2024-03-05 DIAGNOSIS — C3412 Malignant neoplasm of upper lobe, left bronchus or lung: Secondary | ICD-10-CM | POA: Diagnosis not present

## 2024-03-14 ENCOUNTER — Ambulatory Visit (HOSPITAL_COMMUNITY)
Admission: RE | Admit: 2024-03-14 | Discharge: 2024-03-14 | Disposition: A | Discharge status: Home / Self Care | Source: Ambulatory Visit | Attending: Physician Assistant | Admitting: Physician Assistant

## 2024-03-14 DIAGNOSIS — C3412 Malignant neoplasm of upper lobe, left bronchus or lung: Secondary | ICD-10-CM | POA: Diagnosis not present

## 2024-03-14 DIAGNOSIS — C3490 Malignant neoplasm of unspecified part of unspecified bronchus or lung: Secondary | ICD-10-CM | POA: Insufficient documentation

## 2024-03-14 LAB — GLUCOSE, CAPILLARY: Glucose-Capillary: 110 mg/dL — ABNORMAL HIGH (ref 70–99)

## 2024-03-14 MED ORDER — FLUDEOXYGLUCOSE F - 18 (FDG) INJECTION
8.3900 | Freq: Once | INTRAVENOUS | Status: AC
  Administered 2024-03-14: 8.39 via INTRAVENOUS

## 2024-03-22 NOTE — Progress Notes (Unsigned)
 [facility] OFFICE PROGRESS NOTE  [name], MD [address] 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 3) bladder cancer in situ, followed by Dr. Mena Goes.  Status post 6 weeks of BCG treatment. Diagnosed in November 2024.   PRIOR THERAPY: 1) Status post left upper lobe wedge resection with lymph node sampling.  2) Status post 6 weeks of intravesical BCG treatment. Diagnosed in November 2024. Completed in January 2025  CURRENT THERAPY: ***  INTERVAL HISTORY: Bradley Irwin 78 y.o. male returns to the clinic today for follow-up visit.  The patient was last seen in the clinic on 03/05/2024.  The patient is being followed for his history of small lymphocytic lymphoma as well as stage I non-small cell lung cancer.  He underwent wedge resection of the left upper lobe in June 2020.  He is currently on observation and feeling well when he was last seen in the clinic, he had a repeat CT scan that showed some interval enlargement of a nodular soft tissue lesion adjacent to the left upper lobe staple line concerning for recurrence.  Therefore Dr. Arbutus Ped recommended a PET scan to further evaluate this.  The patient is here today for evaluation and to review his PET scan results.  The patient denies any major changes in his health since he was last seen.  He denies any fever, chills, night sweats, or unexplained weight loss.  He reports his baseline dyspnea on exertion secondary to his heart condition.  He denies any chest pain, shortness of breath, cough, or hemoptysis.  Denies any nausea, vomiting, or diarrhea.  He struggles with constipation and takes MiraLAX and stool softener.  Denies any headache or visual changes.  Denies any abdominal pain or back pain.  He denies any hematuria, malodorous urine, or  dysuria.  He sees Dr. Mena Goes due to his diagnosis of bladder cancer in situ.  His most recent cystoscopy was last month.  His next cystoscopy is scheduled on 04/07/2024.  He has been having some muscle spasms and difficulty urinating since that time.  He is on several medications including Cialis, oxy butyrin and Gemtesa MEDICAL HISTORY: Past Medical History:  Diagnosis Date   Anxiety    Atypical mole 06/08/2010   Right Abdomen(moderate) Nino Glow)   Atypical mole 04/25/1995   Left Upper ShoulderBlade(slight)   Atypical mole 12/31/2013   Left Mid Back(Moderate) Nino Glow)   Bladder cancer (HCC)    CLL (chronic lymphocytic leukemia) (HCC)    Colon polyps    Coronary artery disease    Eczema    GERD (gastroesophageal reflux disease)    Gout    Hearing loss    Hx of cardiovascular stress test    Lexiscan Myoview 5/16:  The study is normal. No ischemia identified. This is a low risk study. Overall left ventricular systolic function was normal. LV cavity size is normal. The left ventricular ejection fraction is normal (55-65%). Sensitivity reduced by bowel loop attenuation artifact.   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  MEDICATIONS:  Current Outpatient Medications  Medication Sig Dispense Refill   acetaminophen (TYLENOL) 500 MG tablet Take 500 mg by mouth at bedtime.     alfuzosin (UROXATRAL) 10 MG 24 hr tablet Take 10 mg by mouth daily.     allopurinol (ZYLOPRIM) 100 MG tablet Take 100 mg by mouth daily.     ALPRAZolam (XANAX) 0.5 MG tablet Take 0.5 mg by mouth 2  (two) times daily as needed for anxiety.      amitriptyline (ELAVIL) 10 MG tablet Take 10 mg by mouth at bedtime as needed.     aspirin EC 81 MG tablet Take 81 mg by mouth daily. Swallow whole.     clidinium-chlordiazePOXIDE (LIBRAX) 5-2.5 MG capsule Take 1 capsule by mouth daily as needed (ibs).     Colchicine 0.6 MG CAPS Take 1 tablet by mouth as needed (gout).     Cranberry-Vitamin C-Probiotic (AZO CRANBERRY PO) Take 2 tablets by mouth daily.     diclofenac Sodium (VOLTAREN) 1 % GEL Apply 1 Application topically 4 (four) times daily as needed (pain).     dicyclomine (BENTYL) 20 MG tablet Take 20 mg by mouth daily as needed for spasms.     Docosanol (ABREVA) 10 % CREA Apply 1 Application topically daily as needed (fever blisters).     Docusate Sodium (DSS) 100 MG CAPS Take 100 mg by mouth 2 (two) times daily as needed (constipation).     ferrous sulfate 325 (65 FE) MG tablet Take 325 mg by mouth daily. (Patient not taking: Reported on 02/18/2024)     fluticasone (FLONASE) 50 MCG/ACT nasal spray Place 1 spray into both nostrils daily.     hydrOXYzine (ATARAX) 25 MG tablet Take 25 mg by mouth as needed for itching.     levothyroxine (SYNTHROID) 150 MCG tablet Take 137 mcg by mouth daily before breakfast.     Methenamine-Sodium Salicylate (AZO URINARY TRACT DEFENSE) 162-162.5 MG TABS Take 1 tablet by mouth as needed (urinary pain).     nitroGLYCERIN (NITROSTAT) 0.4 MG SL tablet Place 0.4 mg under the tongue every 5 (five) minutes as needed for chest pain.     omeprazole (PRILOSEC) 20 MG capsule Take 20 mg by mouth 2 (two) times daily.      oxybutynin (DITROPAN) 5 MG tablet Take 1 tablet (5 mg total) by mouth 2 (two) times daily as needed for bladder spasms. (Patient not taking: Reported on 03/05/2024) 15 tablet 1   polyethylene glycol (MIRALAX / GLYCOLAX) 17 g packet Take 17 g by mouth daily.     Probiotic Product (PROBIOTIC PO) Take 1 capsule by mouth daily.     rosuvastatin (CRESTOR) 10 MG tablet  Take 10 mg by mouth daily.     simethicone (MYLICON) 125 MG chewable tablet Chew 125 mg by mouth every 6 (six) hours as needed for flatulence.     tadalafil (CIALIS) 5 MG tablet      Vibegron (GEMTESA) 75 MG TABS Take 1 tablet by mouth daily.     No current facility-administered medications for this visit.    SURGICAL HISTORY:  Past Surgical History:  Procedure Laterality Date   CARDIAC CATHETERIZATION     CHOLECYSTECTOMY  10/26/2011   COLONOSCOPY     CORONARY STENT  PLACEMENT  2006   CYSTOSCOPY W/ RETROGRADES Left 10/02/2023   Procedure: CYSTOSCOPY WITH RETROGRADE PYELOGRAM;  Surgeon: Jerilee Field, MD;  Location: [facility];  Service: Urology;  Laterality: Left;  60 MINS FOR CASE   CYSTOSCOPY WITH BIOPSY N/A 10/02/2023   Procedure: CYSTOSCOPY WITH BLADDER BIOPSY FULGURATION;  Surgeon: Jerilee Field, MD;  Location: [facility];  Service: Urology;  Laterality: N/A;   CYSTOSCOPY WITH BIOPSY Left 10/30/2023   Procedure: CYSTOSCOPY WITH RETROROGRADE;  Surgeon: Jerilee Field, MD;  Location: [facility];  Service: Urology;  Laterality: Left;   CYSTOSCOPY WITH URETEROSCOPY AND STENT PLACEMENT Left 10/30/2023   Procedure: CYSTOSCOPY WITH  LEFT URETEROSCOPY AND LEFT URETERAL STENT EXCHANGE;  Surgeon: Jerilee Field, MD;  Location: [facility];  Service: Urology;  Laterality: Left;  75 MINUTES   CYSTOSCOPY/URETEROSCOPY/HOLMIUM LASER/STENT PLACEMENT Left 10/02/2023   Procedure: CYSTOSCOPY, LEFT DIAGNOSTIC URETEROSCOPY/STENT PLACEMENT;  Surgeon: Jerilee Field, MD;  Location: [facility];  Service: Urology;  Laterality: Left;   FINGER SURGERY Right 78 y/o   cut index rt   INTERCOSTAL NERVE BLOCK  06/16/2019   Procedure: Intercostal Nerve Block;  Surgeon: Delight Ovens, MD;  Location: [facility] OR;  Service: Thoracic;;   LIPOMA EXCISION Left 11/18/2013   Procedure: EXCISION POSTERIOR NECK LIPOMA ;  Surgeon: Almond Lint, MD;  Location: [facility];  Service:  General;  Laterality: Left;   LIVER BIOPSY  09/18/2011   LIVER LOBECTOMY  10/26/2011   LYMPH NODE DISSECTION  06/16/2019   Procedure: Lymph Node Dissection;  Surgeon: Delight Ovens, MD;  Location: [facility];  Service: Thoracic;;   NEPHRECTOMY Right 2008   right   TONSILLECTOMY     VIDEO ASSISTED THORACOSCOPY (VATS)/WEDGE RESECTION Left 06/16/2019   Procedure: VIDEO ASSISTED THORACOSCOPY (VATS)/MINI THOROCOTOMY WITH LEFT UPPER LOBE WEDGE RESECTION;  Surgeon: Delight Ovens, MD;  Location: [facility] OR;  Service: Thoracic;  Laterality: Left;   VIDEO BRONCHOSCOPY N/A 06/16/2019   Procedure: VIDEO BRONCHOSCOPY;  Surgeon: Delight Ovens, MD;  Location: [facility];  Service: Thoracic;  Laterality: N/A;    REVIEW OF SYSTEMS:   Review of Systems  Constitutional: Negative for appetite change, chills, fatigue, fever and unexpected weight change.  HENT:   Negative for mouth sores, nosebleeds, sore throat and trouble swallowing.   Eyes: Negative for eye problems and icterus.  Respiratory: Negative for cough, hemoptysis, shortness of breath and wheezing.   Cardiovascular: Negative for chest pain and leg swelling.  Gastrointestinal: Negative for abdominal pain, constipation, diarrhea, nausea and vomiting.  Genitourinary: Negative for bladder incontinence, difficulty urinating, dysuria, frequency and hematuria.   Musculoskeletal: Negative for back pain, gait problem, neck pain and neck stiffness.  Skin: Negative for itching and rash.  Neurological: Negative for dizziness, extremity weakness, gait problem, headaches, light-headedness and seizures.  Hematological: Negative for adenopathy. Does not bruise/bleed easily.  Psychiatric/Behavioral: Negative for confusion, depression and sleep disturbance. The patient is not nervous/anxious.     PHYSICAL EXAMINATION:  There were no vitals taken for this visit.  ECOG PERFORMANCE STATUS: {CHL ONC ECOG [id number]  Physical Exam  Constitutional: Oriented to  person, place, and time and well-developed, well-nourished, and in no distress. No distress.  HENT:  Head: Normocephalic and atraumatic.  Mouth/Throat: Oropharynx is clear and moist. No oropharyngeal exudate.  Eyes: Conjunctivae are normal. Right eye exhibits no discharge. Left eye exhibits no discharge. No scleral icterus.  Neck: Normal range of motion. Neck supple.  Cardiovascular: Normal rate, regular rhythm, normal heart  sounds and intact distal pulses.   Pulmonary/Chest: Effort normal and breath sounds normal. No respiratory distress. No wheezes. No rales.  Abdominal: Soft. Bowel sounds are normal. Exhibits no distension and no mass. There is no tenderness.  Musculoskeletal: Normal range of motion. Exhibits no edema.  Lymphadenopathy:    No cervical adenopathy.  Neurological: Alert and oriented to person, place, and time. Exhibits normal muscle tone. Gait normal. Coordination normal.  Skin: Skin is warm and dry. No rash noted. Not diaphoretic. No erythema. No pallor.  Psychiatric: Mood, memory and judgment normal.  Vitals reviewed.  LABORATORY DATA: Lab Results  Component Value Date   WBC 13.2 (H) 02/19/2024   HGB 12.7 (L) 02/19/2024   HCT 40.5 02/19/2024   MCV 81.5 02/19/2024   PLT 207 02/19/2024      Chemistry      Component Value Date/Time   NA 139 02/19/2024 0933   K 4.1 02/19/2024 0933   CL 106 02/19/2024 0933   CO2 24 02/19/2024 0933   BUN 35 (H) 02/19/2024 0933   CREATININE 1.90 (H) 02/19/2024 0933   CREATININE 1.30 (H) 02/06/2016 1714      Component Value Date/Time   CALCIUM 10.3 02/19/2024 0933   ALKPHOS 144 (H) 02/19/2024 0933   AST 18 02/19/2024 0933   ALT 13 02/19/2024 0933   BILITOT 0.5 02/19/2024 0933       RADIOGRAPHIC STUDIES:  No results found.   ASSESSMENT/PLAN:  This is a very pleasant 78 year old Caucasian male with a history of ***stage I non-small cell lung cancer, adenocarcinoma.  He is Positive with met 14 splice mutation.  He status  post wedge resection of the left upper lobe and lymph node sampling in June 2020.  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   He also was recently found to have bladder cancer in situ and he is following closely with Dr. Mena Goes.  Patient underwent 6 weeks of BCG treatment and undergoes cystoscopies. His next one is scheduled for 04/07/24   The patient's restaging CT scan from early March 2025 showed enlargement of a nodular soft tissue lesion adjacent to the left upper lobe staple line concerning for local recurrence.  Therefore the patient had a PET scan.  The patient was seen with Dr. Arbutus Ped today.  Dr. Arbutus Ped personally and independently reviewed the PET scan.  The PET scan showed ***  Dr. Arbutus Ped recommends?  Referral to radiation oncology?  We will see him back for a follow-up visit in 4 months after a repeat CT scan.  He will continue to follow with urology for his bladder cancer and muscle spasms  The patient was advised to call immediately if he has any concerning symptoms in the interval. The patient voices understanding of current disease status and treatment options and is in agreement with the current care plan. All questions were answered. The patient knows to call the clinic with any problems, questions or concerns. We can certainly see the patient much sooner if necessary  No orders of the defined types were placed in this encounter.    I spent {CHL ONC TIME VISIT - [id number]} counseling the patient face to face. The total time spent in the appointment was {CHL ONC TIME VISIT - OZHYQ:[id number]}.  Berneita Sanagustin L Firman Petrow, PA-C 03/22/24

## 2024-03-26 ENCOUNTER — Encounter: Payer: Self-pay | Admitting: Internal Medicine

## 2024-03-26 ENCOUNTER — Inpatient Hospital Stay: Attending: Internal Medicine

## 2024-03-26 ENCOUNTER — Inpatient Hospital Stay: Admitting: Physician Assistant

## 2024-03-26 VITALS — BP 115/62 | HR 90 | Temp 98.3°F | Resp 15 | Wt 167.1 lb

## 2024-03-26 DIAGNOSIS — Z905 Acquired absence of kidney: Secondary | ICD-10-CM | POA: Insufficient documentation

## 2024-03-26 DIAGNOSIS — C679 Malignant neoplasm of bladder, unspecified: Secondary | ICD-10-CM | POA: Insufficient documentation

## 2024-03-26 DIAGNOSIS — Z882 Allergy status to sulfonamides status: Secondary | ICD-10-CM | POA: Insufficient documentation

## 2024-03-26 DIAGNOSIS — Z79899 Other long term (current) drug therapy: Secondary | ICD-10-CM | POA: Diagnosis not present

## 2024-03-26 DIAGNOSIS — C3412 Malignant neoplasm of upper lobe, left bronchus or lung: Secondary | ICD-10-CM | POA: Insufficient documentation

## 2024-03-26 DIAGNOSIS — Z885 Allergy status to narcotic agent status: Secondary | ICD-10-CM | POA: Insufficient documentation

## 2024-03-26 DIAGNOSIS — E039 Hypothyroidism, unspecified: Secondary | ICD-10-CM | POA: Insufficient documentation

## 2024-03-26 DIAGNOSIS — C9110 Chronic lymphocytic leukemia of B-cell type not having achieved remission: Secondary | ICD-10-CM | POA: Diagnosis not present

## 2024-03-26 DIAGNOSIS — Z860100 Personal history of colon polyps, unspecified: Secondary | ICD-10-CM | POA: Insufficient documentation

## 2024-03-26 DIAGNOSIS — I10 Essential (primary) hypertension: Secondary | ICD-10-CM | POA: Insufficient documentation

## 2024-03-26 DIAGNOSIS — C3490 Malignant neoplasm of unspecified part of unspecified bronchus or lung: Secondary | ICD-10-CM

## 2024-03-26 DIAGNOSIS — Z9049 Acquired absence of other specified parts of digestive tract: Secondary | ICD-10-CM | POA: Diagnosis not present

## 2024-03-26 LAB — CMP (CANCER CENTER ONLY)
ALT: 18 U/L (ref 0–44)
AST: 25 U/L (ref 15–41)
Albumin: 4.1 g/dL (ref 3.5–5.0)
Alkaline Phosphatase: 153 U/L — ABNORMAL HIGH (ref 38–126)
Anion gap: 8 (ref 5–15)
BUN: 40 mg/dL — ABNORMAL HIGH (ref 8–23)
CO2: 23 mmol/L (ref 22–32)
Calcium: 10.1 mg/dL (ref 8.9–10.3)
Chloride: 107 mmol/L (ref 98–111)
Creatinine: 1.76 mg/dL — ABNORMAL HIGH (ref 0.61–1.24)
GFR, Estimated: 40 mL/min — ABNORMAL LOW (ref >60)
Glucose, Bld: 105 mg/dL — ABNORMAL HIGH (ref 70–99)
Potassium: 4.1 mmol/L (ref 3.5–5.1)
Sodium: 138 mmol/L (ref 135–145)
Total Bilirubin: 0.4 mg/dL (ref 0.0–1.2)
Total Protein: 7 g/dL (ref 6.5–8.1)

## 2024-03-26 LAB — CBC WITH DIFFERENTIAL (CANCER CENTER ONLY)
Abs Immature Granulocytes: 0.02 10*3/uL (ref 0.00–0.07)
Basophils Absolute: 0 10*3/uL (ref 0.0–0.1)
Basophils Relative: 0 %
Eosinophils Absolute: 0.1 10*3/uL (ref 0.0–0.5)
Eosinophils Relative: 1 %
HCT: 38.8 % — ABNORMAL LOW (ref 39.0–52.0)
Hemoglobin: 12.4 g/dL — ABNORMAL LOW (ref 13.0–17.0)
Immature Granulocytes: 0 %
Lymphocytes Relative: 65 %
Lymphs Abs: 6.7 10*3/uL — ABNORMAL HIGH (ref 0.7–4.0)
MCH: 26.2 pg (ref 26.0–34.0)
MCHC: 32 g/dL (ref 30.0–36.0)
MCV: 82 fL (ref 80.0–100.0)
Monocytes Absolute: 0.2 10*3/uL (ref 0.1–1.0)
Monocytes Relative: 2 %
Neutro Abs: 3.4 10*3/uL (ref 1.7–7.7)
Neutrophils Relative %: 32 %
Platelet Count: 147 10*3/uL — ABNORMAL LOW (ref 150–400)
RBC: 4.73 MIL/uL (ref 4.22–5.81)
RDW: 15.9 % — ABNORMAL HIGH (ref 11.5–15.5)
Smear Review: NORMAL
WBC Count: 10.4 10*3/uL (ref 4.0–10.5)
nRBC: 0 % (ref 0.0–0.2)

## 2024-03-26 NOTE — Progress Notes (Signed)
 [facility] OFFICE PROGRESS NOTE  Bradley Quitter, MD [address] 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  Status post 6 weeks of BCG treatment. Diagnosed in November 2024.   PRIOR THERAPY: 1) Status post left upper lobe wedge resection with lymph node sampling.  2) Status post 6 weeks of intravesical BCG treatment. Diagnosed in November 2024. Completed in January 2025  CURRENT THERAPY: Referral to CT surgery vs radiation   INTERVAL HISTORY: OSA [name]. male returns to the clinic today for a follow-up visit.  The patient was last seen in the clinic on 03/05/2024.  The patient is being followed for his history of small lymphocytic lymphoma as well as stage I non-small cell lung cancer.  He underwent wedge resection of the left upper lobe in June 2020.  He is currently on observation and feeling well when he was last seen in the clinic, he had a repeat CT scan that showed some interval enlargement of a nodular soft tissue lesion adjacent to the left upper lobe staple line concerning for recurrence.  Therefore, Dr. Arbutus Ped recommended a PET scan to further evaluate this. The patient is here today for evaluation and to review his PET scan results.  Since last being seen, the patient was previously having a lot of bladder spasms but he reports that these have subsided somewhat since he was last seen.  He is still having night sweats.  He also has been having some increased itching and denies any new lotions, soaps, or detergents.  He does not take any antihistamines. He sees Dr. Mena Goes due to his diagnosis of bladder cancer in situ.  His most recent cystoscopy was in February. His  next cystoscopy is scheduled on 04/07/2024.The patient reports that he is having routine PSAs checked but he is not sure what his most recent PSA was.  He denies any fever, chills, or unexplained weight loss.  He denies any changes in his baseline dyspnea on exertion.  He denies any chest pain, cough, or hemoptysis.  He denies any nausea, vomiting, or diarrhea.  He does struggle with constipation for which she takes MiraLAX and stool softener and it is somewhat better.  He denies any headache or visual changes.  He denies any hematuria, malodorous urine, or dysuria.  He is here today for evaluation and to review his PET scan results.  MEDICAL HISTORY: Past Medical History:  Diagnosis Date   Anxiety    Atypical mole 06/08/2010   Right Abdomen(moderate) Nino Glow)   Atypical mole 04/25/1995   Left Upper ShoulderBlade(slight)   Atypical mole 12/31/2013   Left Mid Back(Moderate) Nino Glow)   Bladder cancer (HCC)    CLL (chronic lymphocytic leukemia) (HCC)    Colon polyps    Coronary artery disease    Eczema    GERD (gastroesophageal reflux disease)    Gout    Hearing loss    Hx of cardiovascular stress test    Lexiscan Myoview 5/16:  The study is normal. No ischemia identified. This is a low risk study. Overall left ventricular systolic function was normal. LV cavity size is normal. The left ventricular ejection fraction is normal (55-65%). Sensitivity reduced by  bowel loop attenuation artifact.   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  MEDICATIONS:  Current Outpatient  Medications  Medication Sig Dispense Refill   acetaminophen (TYLENOL) 500 MG tablet Take 500 mg by mouth at bedtime.     alfuzosin (UROXATRAL) 10 MG 24 hr tablet Take 10 mg by mouth daily.     allopurinol (ZYLOPRIM) 100 MG tablet Take 100 mg by mouth daily.     ALPRAZolam (XANAX) 0.5 MG tablet Take 0.5 mg by mouth 2 (two) times daily as needed for anxiety.      amitriptyline (ELAVIL) 10 MG tablet Take 10 mg by mouth at bedtime as needed.     aspirin EC 81 MG tablet Take 81 mg by mouth daily. Swallow whole.     clidinium-chlordiazePOXIDE (LIBRAX) 5-2.5 MG capsule Take 1 capsule by mouth daily as needed (ibs).     Colchicine 0.6 MG CAPS Take 1 tablet by mouth as needed (gout).     Cranberry-Vitamin C-Probiotic (AZO CRANBERRY PO) Take 2 tablets by mouth daily.     diclofenac Sodium (VOLTAREN) 1 % GEL Apply 1 Application topically 4 (four) times daily as needed (pain).     dicyclomine (BENTYL) 20 MG tablet Take 20 mg by mouth daily as needed for spasms.     Docosanol (ABREVA) 10 % CREA Apply 1 Application topically daily as needed (fever blisters).     Docusate Sodium (DSS) 100 MG CAPS Take 100 mg by mouth 2 (two) times daily as needed (constipation).     ferrous sulfate 325 (65 FE) MG tablet Take 325 mg by mouth daily. (Patient not taking: Reported on 02/18/2024)     fluticasone (FLONASE) 50 MCG/ACT nasal spray Place 1 spray into both nostrils daily.     hydrOXYzine (ATARAX) 25 MG tablet Take 25 mg by mouth as needed for itching.     levothyroxine (SYNTHROID) 150 MCG tablet Take 137 mcg by mouth daily before breakfast.     Methenamine-Sodium Salicylate (AZO URINARY TRACT DEFENSE) 162-162.5 MG TABS Take 1 tablet by mouth as needed (urinary pain).     nitroGLYCERIN (NITROSTAT) 0.4 MG SL tablet Place 0.4 mg under the tongue every 5 (five) minutes as needed for chest pain.     omeprazole (PRILOSEC) 20 MG capsule Take 20 mg by mouth 2 (two) times daily.      oxybutynin (DITROPAN) 5 MG tablet Take 1  tablet (5 mg total) by mouth 2 (two) times daily as needed for bladder spasms. (Patient not taking: Reported on 03/05/2024) 15 tablet 1   polyethylene glycol (MIRALAX / GLYCOLAX) 17 g packet Take 17 g by mouth daily.     Probiotic Product (PROBIOTIC PO) Take 1 capsule by mouth daily.     rosuvastatin (CRESTOR) 10 MG tablet Take 10 mg by mouth daily.     simethicone (MYLICON) 125 MG chewable tablet Chew 125 mg by mouth every 6 (six) hours as needed for flatulence.     tadalafil (CIALIS) 5  MG tablet      Vibegron (GEMTESA) 75 MG TABS Take 1 tablet by mouth daily.     No current facility-administered medications for this visit.    SURGICAL HISTORY:  Past Surgical History:  Procedure Laterality Date   CARDIAC CATHETERIZATION     CHOLECYSTECTOMY  10/26/2011   COLONOSCOPY     CORONARY STENT PLACEMENT  2006   CYSTOSCOPY W/ RETROGRADES Left 10/02/2023   Procedure: CYSTOSCOPY WITH RETROGRADE PYELOGRAM;  Surgeon: Jerilee Field, MD;  Location: [facility];  Service: Urology;  Laterality: Left;  60 MINS FOR CASE   CYSTOSCOPY WITH BIOPSY N/A 10/02/2023   Procedure: CYSTOSCOPY WITH BLADDER BIOPSY FULGURATION;  Surgeon: Jerilee Field, MD;  Location: [facility];  Service: Urology;  Laterality: N/A;   CYSTOSCOPY WITH BIOPSY Left 10/30/2023   Procedure: CYSTOSCOPY WITH RETROROGRADE;  Surgeon: Jerilee Field, MD;  Location: [facility];  Service: Urology;  Laterality: Left;   CYSTOSCOPY WITH URETEROSCOPY AND STENT PLACEMENT Left 10/30/2023   Procedure: CYSTOSCOPY WITH  LEFT URETEROSCOPY AND LEFT URETERAL STENT EXCHANGE;  Surgeon: Jerilee Field, MD;  Location: [facility];  Service: Urology;  Laterality: Left;  75 MINUTES   CYSTOSCOPY/URETEROSCOPY/HOLMIUM LASER/STENT PLACEMENT Left 10/02/2023   Procedure: CYSTOSCOPY, LEFT DIAGNOSTIC URETEROSCOPY/STENT PLACEMENT;  Surgeon: Jerilee Field, MD;  Location: [facility];  Service: Urology;  Laterality: Left;   FINGER SURGERY Right 66  y/Bradley   cut index rt   INTERCOSTAL NERVE BLOCK  06/16/2019   Procedure: Intercostal Nerve Block;  Surgeon: Delight Ovens, MD;  Location: [facility] OR;  Service: Thoracic;;   LIPOMA EXCISION Left 11/18/2013   Procedure: EXCISION POSTERIOR NECK LIPOMA ;  Surgeon: Almond Lint, MD;  Location: [facility];  Service: General;  Laterality: Left;   LIVER BIOPSY  09/18/2011   LIVER LOBECTOMY  10/26/2011   LYMPH NODE DISSECTION  06/16/2019   Procedure: Lymph Node Dissection;  Surgeon: Delight Ovens, MD;  Location: [facility] OR;  Service: Thoracic;;   NEPHRECTOMY Right 2008   right   TONSILLECTOMY     VIDEO ASSISTED THORACOSCOPY (VATS)/WEDGE RESECTION Left 06/16/2019   Procedure: VIDEO ASSISTED THORACOSCOPY (VATS)/MINI THOROCOTOMY WITH LEFT UPPER LOBE WEDGE RESECTION;  Surgeon: Delight Ovens, MD;  Location: [facility] OR;  Service: Thoracic;  Laterality: Left;   VIDEO BRONCHOSCOPY N/A 06/16/2019   Procedure: VIDEO BRONCHOSCOPY;  Surgeon: Delight Ovens, MD;  Location: [facility] OR;  Service: Thoracic;  Laterality: N/A;    REVIEW OF SYSTEMS:   Review of Systems  Constitutional: Positive for night sweats.  Negative for appetite change, chills, fatigue, fever and unexpected weight change.  HENT:  Negative for mouth sores, nosebleeds, sore throat and trouble swallowing.   Eyes: Negative for eye problems and icterus.  Respiratory: Positive for baseline dyspnea on exertion. Negative for cough, hemoptysis, and wheezing.   Cardiovascular: Negative for chest pain and leg swelling.  Gastrointestinal: Positive for occasional constipation. Negative for abdominal pain, diarrhea, nausea and vomiting.  Genitourinary: Positive for bladder spasms (improved since he was last seen). Negative for bladder incontinence, dysuria, frequency and hematuria.   Musculoskeletal: Negative for back pain, gait problem, neck pain and neck stiffness.  Skin: Positive for generalized itching.  Neurological: Negative for  dizziness, extremity weakness, gait problem, headaches, light-headedness and seizures.  Hematological: Negative for adenopathy. Does not bruise/bleed easily.  Psychiatric/Behavioral: Negative for confusion, depression and sleep disturbance. The patient is not nervous/anxious.     PHYSICAL EXAMINATION:  Blood pressure 115/62, pulse 90, temperature 98.3 F (  36.8 C), temperature source Temporal, resp. rate 15, weight 167 lb 1.6 oz (75.8 kg), SpO2 98%.  ECOG PERFORMANCE STATUS: 1  Physical Exam  Constitutional: Oriented to person, place, and time and well-developed, well-nourished, and in no distress. HENT:  Head: Normocephalic and atraumatic.  Mouth/Throat: Oropharynx is clear and moist. No oropharyngeal exudate.  Eyes: Conjunctivae are normal. Right eye exhibits no discharge. Left eye exhibits no discharge. No scleral icterus.  Neck: Normal range of motion. Neck supple.  Cardiovascular: Normal rate, regular rhythm, normal heart sounds and intact distal pulses.   Pulmonary/Chest: Effort normal and breath sounds normal. No respiratory distress. No wheezes. No rales.  Abdominal: Soft. Bowel sounds are normal. Exhibits no distension and no mass. There is no tenderness.  Musculoskeletal: Normal range of motion. Exhibits no edema.  Lymphadenopathy:    No cervical adenopathy.  Neurological: Alert and oriented to person, place, and time. Exhibits normal muscle tone. Gait normal. Coordination normal.  Skin: Skin is warm and dry. No rash noted. Not diaphoretic. No erythema. No pallor.  Psychiatric: Mood, memory and judgment normal.  Vitals reviewed.  LABORATORY DATA: Lab Results  Component Value Date   WBC 10.4 03/26/2024   HGB 12.4 (L) 03/26/2024   HCT 38.8 (L) 03/26/2024   MCV 82.0 03/26/2024   PLT 147 (L) 03/26/2024      Chemistry      Component Value Date/Time   NA 138 03/26/2024 0908   K 4.1 03/26/2024 0908   CL 107 03/26/2024 0908   CO2 23 03/26/2024 0908   BUN 40 (H)  03/26/2024 0908   CREATININE 1.76 (H) 03/26/2024 0908   CREATININE 1.30 (H) 02/06/2016 1714      Component Value Date/Time   CALCIUM 10.1 03/26/2024 0908   ALKPHOS 153 (H) 03/26/2024 0908   AST 25 03/26/2024 0908   ALT 18 03/26/2024 0908   BILITOT 0.4 03/26/2024 0908       RADIOGRAPHIC STUDIES:  NM PET Image Restage (PS) Skull Base to Thigh (F-18 FDG) Result Date: 03/26/2024 CLINICAL DATA:  Subsequent treatment strategy for non-small cell lung cancer. EXAM: NUCLEAR MEDICINE PET SKULL BASE TO THIGH TECHNIQUE: 8.4 mCi F-18 FDG was injected intravenously. Full-ring PET imaging was performed from the skull base to thigh after the radiotracer. CT data was obtained and used for attenuation correction and anatomic localization. Fasting blood glucose: 110 mg/dl COMPARISON:  CT 16/09/9603 FINDINGS: NECK: No hypermetabolic lymph nodes in the neck. Incidental CT findings: None. CHEST: Within the LEFT upper lobe along the surgical suture line there is a 19 mm nodule (image 76) with intense metabolic activity (SUV max equal 15.4) No hypermetabolic mediastinal lymph nodes. Within the RIGHT upper lobe angular nodule measuring 14 mm not changed from multiple CT exams has relatively low metabolic activity (SUV max equal 2.9). Enlarged bilateral axillary lymph nodes with fairly intense metabolic activity. For example LEFT axillary node measuring 3.3 cm with SUV max equal 6.8. Lymph node similar in size to 2023 increased slightly from 2022. Incidental CT findings: None. ABDOMEN/PELVIS: Spleen is normal size with normal metabolic activity. No hypermetabolic or enlarged abdominopelvic lymph nodes. No evidence of liver metastasis. Incidental CT findings: Post RIGHT nephrectomy anatomy. There is intense metabolic activity within the LEFT and RIGHT lobe of the prostate gland SUV max equal 21 c. no hypermetabolic pelvic lymph nodes. SKELETON: No focal hypermetabolic activity to suggest skeletal metastasis. Incidental CT  findings: None. IMPRESSION: 1. Hypermetabolic nodule in the LEFT upper lobe along the surgical suture line is  consistent with recurrent lung adenocarcinoma. 2. No evidence of mediastinal nodal metastasis. 3. Enlarged bilateral axillary lymph nodes with high metabolic activity consistent with lymphoma. Activity is higher than typical low-grade lymphoma. Similar size to comparison exam. 4. Intense metabolic activity within the prostate gland. Recommend correlation with PSA. Electronically Signed   By: Genevive Bi M.D.   On: 03/26/2024 08:46     ASSESSMENT/PLAN:  This is a very pleasant 78 year old Caucasian male with a history of stage I non-small cell lung cancer, adenocarcinoma.  He is Positive with met 14 splice mutation.  He status post wedge resection of the left upper lobe and lymph node sampling in June 2020. He has suspicious local recurrence that was seen in March 2025.  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   He also was recently found to have bladder cancer in situ and he is following closely with Dr. Mena Goes.  The patient  underwent 6 weeks of BCG treatment and undergoes cystoscopies. His next one is scheduled for 04/07/24   The patient's restaging CT scan from early March 2025 showed enlargement of a nodular soft tissue lesion adjacent to the left upper lobe staple line concerning for local recurrence.  Therefore the patient had a PET scan.  The patient was seen with Dr. Arbutus Ped today.  Dr. Arbutus Ped personally and independently reviewed the PET scan.  The PET scan showed hypermetabolic nodule in the left upper lobe along the suture line consistent with recurrent lung adenocarcinoma.  There is no evidence of mediastinal nodal metastases.  He has enlarged bilateral axillary lymph nodes which are hypermetabolic consistent with lymphoma.  He also has hypermetabolic activity in the prostate gland.    Regarding the lung, Dr. Arbutus Ped explained this looks suspicious for local recurrence.  Dr. Arbutus Ped recommends referral to cardiothoracic surgery to see if he would be considered for surgical resection.  If he is not considered for surgical resection, we will get their input on possible repeat biopsy.  If the patient is not a candidate for surgery, then we would recommend referral to radiation oncology for SBRT.  Regarding the hypermetabolic axillary lymph nodes, these were previously biopsied and consistent with his known CLL.  Overall his white blood cell count is stable at this time.  We will continue to monitor his CBCs.  Regarding the hypermetabolic activity in the prostate gland, I have reached out to his urologist to make him aware of the findings and further workup at his upcoming appointment on 04/07/2024.  I asked the patient to reach out to [place] after he sees the surgeon so I may make his follow-up appointments.  If he undergoes surgery, then we will see him back 1 month after his surgery.  If he does not undergo surgery and proceed with radiation then we would see him back in 4 months with a restaging CT scan the week prior.  For his generalized itching he was advised to start taking Claritin daily.  The patient was advised to call immediately if he has any concerning symptoms in the interval. The patient voices understanding of current disease status and treatment options and is in agreement with the current care plan. All questions were answered. The patient knows to call the clinic with any problems, questions or concerns. We can certainly see the patient much sooner if necessary  Orders Placed This Encounter  Procedures   Ambulatory referral to Cardiothoracic Surgery  Referral Priority:   Routine    Referral Type:   Surgical    Referral Reason:   Specialty Services Required    Requested Specialty:   Cardiothoracic Surgery    Number of Visits Requested:   1      Lajuana Matte,  03/26/24 ADDENDUM: Hematology/Oncology Attending: I had a face-to-face encounter with the patient today.  I reviewed his record, lab, scan and recommended his care plan.  This is a very pleasant 78 years old white male with history of stage Ia non-small cell lung cancer, adenocarcinoma with positive met exon 14 splice mutation as well as PD-L1 expression of 90% diagnosed in June 2020.  The patient also has a history of biopsy-proven small lymphocytic lymphoma based on left axillary lymph node biopsy in 2021.  He also has bladder carcinoma in situ followed by Dr. Mena Goes and treated with BCG in the past. He underwent left upper lobe wedge resection with lymph node sampling under the care of Dr. Tyrone Sage for the lung cancer.  He has been on observation recently and was found to have a suspicious soft tissue density in the left lung close to the suture line.  The patient had a PET scan performed recently.  I personally and independently reviewed the PET scan and discussed the result with the patient and his wife.  He has PET scan showed hypermetabolic activity in the left upper lobe nodule along the surgical suture consistent with recurrent lung adenocarcinoma.  There was no evidence of mediastinal nodal metastasis.  There was enlarged bilateral axillary lymph nodes with hypermetabolic activity consistent with the small lymphocytic lymphoma but the activity is higher than what was seen with low-grade lymphoma.  The lymph node has similar size to the prior exam.  There was also intense metabolic activity within the prostate gland and we will refer the patient to Dr. Mena Goes for evaluation of this condition. I had a lengthy discussion with the patient and his wife about these findings and I recommended for him to see Dr. Dorris Fetch for evaluation and consideration of surgical resection versus biopsy followed by radiation if the patient is not a surgical candidate. We will see him back for follow-up  visit based on the surgical evaluation. For the small lymphocytic lymphoma we will continue to monitor it closely for now. The patient was advised to call immediately if he has any other concerning symptoms in the interval. The total time spent in the appointment was 30 minutes. Disclaimer: This note was dictated with voice recognition software. Similar sounding words can inadvertently be transcribed and may be missed upon review.  Lajuana Matte, MD

## 2024-03-27 DIAGNOSIS — H524 Presbyopia: Secondary | ICD-10-CM | POA: Diagnosis not present

## 2024-03-27 DIAGNOSIS — H2512 Age-related nuclear cataract, left eye: Secondary | ICD-10-CM | POA: Diagnosis not present

## 2024-03-27 DIAGNOSIS — H02051 Trichiasis without entropian right upper eyelid: Secondary | ICD-10-CM | POA: Diagnosis not present

## 2024-03-31 DIAGNOSIS — N401 Enlarged prostate with lower urinary tract symptoms: Secondary | ICD-10-CM | POA: Diagnosis not present

## 2024-04-07 DIAGNOSIS — C672 Malignant neoplasm of lateral wall of bladder: Secondary | ICD-10-CM | POA: Diagnosis not present

## 2024-04-14 ENCOUNTER — Other Ambulatory Visit: Payer: Self-pay | Admitting: Urology

## 2024-04-14 DIAGNOSIS — N403 Nodular prostate with lower urinary tract symptoms: Secondary | ICD-10-CM

## 2024-04-15 ENCOUNTER — Telehealth: Payer: Self-pay | Admitting: Radiation Oncology

## 2024-04-15 ENCOUNTER — Encounter: Payer: Self-pay | Admitting: Thoracic Surgery (Cardiothoracic Vascular Surgery)

## 2024-04-15 ENCOUNTER — Other Ambulatory Visit: Payer: Self-pay | Admitting: Thoracic Surgery (Cardiothoracic Vascular Surgery)

## 2024-04-15 ENCOUNTER — Institutional Professional Consult (permissible substitution): Admitting: Thoracic Surgery (Cardiothoracic Vascular Surgery)

## 2024-04-15 VITALS — BP 110/70 | HR 84 | Resp 20 | Ht 66.0 in | Wt 167.0 lb

## 2024-04-15 DIAGNOSIS — R911 Solitary pulmonary nodule: Secondary | ICD-10-CM | POA: Diagnosis not present

## 2024-04-15 NOTE — Progress Notes (Signed)
 PCP is [name], [name], MD Referring Provider is [name], [name]*  Chief Complaint  Patient presents with   Lung Lesion    Surgical consult,/ PET Scan 03/14/24/ Chest CT 02/20/24/ HX of LUL wedge 05/2019     HPI: Bradley Irwin is sent for consultation regarding a left upper lobe lung nodule.  Donovyn "Bradley Irwin" Irwin is a 78 year old former smoker with a past medical history significant for bladder cancer, renal cell carcinoma status post nephrectomy, single kidney, chronic lymphocytic leukemia, small lymphocytic lymphoma, CAD, stent in 2006, reflux, gout, hypertension, hyperlipidemia, hypothyroidism, irritable bowel syndrome, PVD, and psoriasis.  He had a left upper lobe wedge resection and node sampling by Dr. Nicanor Barge in 2020 for stage Ia adenocarcinoma.  Recent CT scans showed a soft tissue density around his previous staple line.  It had slowly enlarged.  PET/CT showed it was markedly hypermetabolic.  There also was hypermetabolic activity in axillary lymph nodes bilaterally left greater than right and prostate.  He is undergoing treatment for bladder cancer with BCG.  Followed by Dr. Derrick Fling.  He is not having any chest pain, pressure, or tightness.  He does get short of breath with heavy exertion and walking a flight of stairs without any issue and can walk for long distance at a normal pace without having to stop.  Has had poor appetite and decreased energy.  No headaches or visual changes.  Has had some issues with reflux in the past controlled with Prilosec currently.  Zubrod Score: At the time of surgery this patient's most appropriate activity status/level should be described as: []     0    Normal activity, no symptoms [x]     1    Restricted in physical strenuous activity but ambulatory, able to do out light work []     2    Ambulatory and capable of self care, unable to do work activities, up and about >50 % of waking hours                              []     3    Only limited self  care, in bed greater than 50% of waking hours []     4    Completely disabled, no self care, confined to bed or chair []     5    Moribund    Past Medical History:  Diagnosis Date   Anxiety    Atypical mole 06/08/2010   Right Abdomen(moderate) [name])   Atypical mole 04/25/1995   Left Upper ShoulderBlade(slight)   Atypical mole 12/31/2013   Left Mid Back(Moderate) [name])   Bladder cancer (HCC)    CLL (chronic lymphocytic leukemia) (HCC)    Colon polyps    Coronary artery disease    Eczema    GERD (gastroesophageal reflux disease)    Gout    Hearing loss    Hx of cardiovascular stress test    Lexiscan  Myoview  5/16:  The study is normal. No ischemia identified. This is a low risk study. Overall left ventricular systolic function was normal. LV cavity size is normal. The left ventricular ejection fraction is normal (55-65%). Sensitivity reduced by bowel loop attenuation artifact.   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   Pneumonia    Psoriasis  PVD (peripheral vascular disease) (HCC)    Renal cell cancer (HCC) 2007   Squamous cell carcinoma in situ    Wears glasses    Wears hearing aid    both ears    Past Surgical History:  Procedure Laterality Date   CARDIAC CATHETERIZATION     CHOLECYSTECTOMY  10/26/2011   COLONOSCOPY     CORONARY STENT PLACEMENT  2006   CYSTOSCOPY W/ RETROGRADES Left 10/02/2023   Procedure: CYSTOSCOPY WITH RETROGRADE PYELOGRAM;  Surgeon: Christina Coyer, MD;  Location: [facility];  Service: Urology;  Laterality: Left;  60 MINS FOR CASE   CYSTOSCOPY WITH BIOPSY N/A 10/02/2023   Procedure: CYSTOSCOPY WITH BLADDER BIOPSY FULGURATION;  Surgeon: Christina Coyer, MD;  Location: [facility];  Service: Urology;  Laterality: N/A;   CYSTOSCOPY WITH BIOPSY Left 10/30/2023   Procedure: CYSTOSCOPY WITH RETROROGRADE;   Surgeon: Christina Coyer, MD;  Location: [facility];  Service: Urology;  Laterality: Left;   CYSTOSCOPY WITH URETEROSCOPY AND STENT PLACEMENT Left 10/30/2023   Procedure: CYSTOSCOPY WITH  LEFT URETEROSCOPY AND LEFT URETERAL STENT EXCHANGE;  Surgeon: Christina Coyer, MD;  Location: [facility];  Service: Urology;  Laterality: Left;  75 MINUTES   CYSTOSCOPY/URETEROSCOPY/HOLMIUM LASER/STENT PLACEMENT Left 10/02/2023   Procedure: CYSTOSCOPY, LEFT DIAGNOSTIC URETEROSCOPY/STENT PLACEMENT;  Surgeon: Christina Coyer, MD;  Location: [facility];  Service: Urology;  Laterality: Left;   FINGER SURGERY Right 78 y/o   cut index rt   INTERCOSTAL NERVE BLOCK  06/16/2019   Procedure: Intercostal Nerve Block;  Surgeon: Norita Beauvais, MD;  Location: [facility] OR;  Service: Thoracic;;   LIPOMA EXCISION Left 11/18/2013   Procedure: EXCISION POSTERIOR NECK LIPOMA ;  Surgeon: Lockie Rima, MD;  Location: [facility];  Service: General;  Laterality: Left;   LIVER BIOPSY  09/18/2011   LIVER LOBECTOMY  10/26/2011   LYMPH NODE DISSECTION  06/16/2019   Procedure: Lymph Node Dissection;  Surgeon: Norita Beauvais, MD;  Location: [facility] OR;  Service: Thoracic;;   NEPHRECTOMY Right 2008   right   TONSILLECTOMY     VIDEO ASSISTED THORACOSCOPY (VATS)/WEDGE RESECTION Left 06/16/2019   Procedure: VIDEO ASSISTED THORACOSCOPY (VATS)/MINI THOROCOTOMY WITH LEFT UPPER LOBE WEDGE RESECTION;  Surgeon: Norita Beauvais, MD;  Location: [facility];  Service: Thoracic;  Laterality: Left;   VIDEO BRONCHOSCOPY N/A 06/16/2019   Procedure: VIDEO BRONCHOSCOPY;  Surgeon: Norita Beauvais, MD;  Location: [facility] OR;  Service: Thoracic;  Laterality: N/A;    Family History  Problem Relation Age of Onset   Hypertension Mother    Heart failure Mother    Heart disease Mother    Aneurysm Mother        brain   Peripheral vascular disease Father    Lung cancer Father    Hypertension Sister    Breast cancer  Sister    Heart attack Maternal Grandfather    Heart attack Maternal Uncle    Colon cancer Neg Hx     Social History Social History   Tobacco Use   Smoking status: Former    Current packs/day: 0.00    Average packs/day: 1 pack/day for 15.0 years (15.0 ttl pk-yrs)    Types: Cigarettes    Start date: 10/25/1968    Quit date: 10/26/1983    Years since quitting: 40.4   Smokeless tobacco: Never  Vaping Use   Vaping status: Never Used  Substance Use Topics   Alcohol  use: Not Currently    Alcohol /week: 14.0 - 20.0 standard  drinks of alcohol     Types: 14 - 20 Glasses of wine per week   Drug use: No    Current Outpatient Medications  Medication Sig Dispense Refill   acetaminophen  (TYLENOL ) 500 MG tablet Take 500 mg by mouth at bedtime.     alfuzosin (UROXATRAL) 10 MG 24 hr tablet Take 10 mg by mouth daily.     allopurinol (ZYLOPRIM) 100 MG tablet Take 100 mg by mouth daily.     ALPRAZolam  (XANAX ) 0.5 MG tablet Take 0.5 mg by mouth 2 (two) times daily as needed for anxiety.      amitriptyline (ELAVIL) 10 MG tablet Take 10 mg by mouth at bedtime as needed.     aspirin  EC 81 MG tablet Take 81 mg by mouth daily. Swallow whole.     clidinium-chlordiazePOXIDE  (LIBRAX ) 5-2.5 MG capsule Take 1 capsule by mouth daily as needed (ibs).     Colchicine 0.6 MG CAPS Take 1 tablet by mouth as needed (gout).     Cranberry-Vitamin C-Probiotic (AZO CRANBERRY PO) Take 2 tablets by mouth daily.     diclofenac Sodium (VOLTAREN) 1 % GEL Apply 1 Application topically 4 (four) times daily as needed (pain).     dicyclomine (BENTYL) 20 MG tablet Take 20 mg by mouth daily as needed for spasms.     Docosanol (ABREVA) 10 % CREA Apply 1 Application topically daily as needed (fever blisters).     Docusate Sodium  (DSS) 100 MG CAPS Take 100 mg by mouth 2 (two) times daily as needed (constipation).     ferrous sulfate 325 (65 FE) MG tablet Take 325 mg by mouth daily.     fluticasone (FLONASE) 50 MCG/ACT nasal spray  Place 1 spray into both nostrils daily.     hydrOXYzine (ATARAX) 25 MG tablet Take 25 mg by mouth as needed for itching.     levothyroxine  (SYNTHROID ) 150 MCG tablet Take 137 mcg by mouth daily before breakfast.     Methenamine-Sodium Salicylate (AZO URINARY TRACT DEFENSE) 162-162.5 MG TABS Take 1 tablet by mouth as needed (urinary pain).     nitroGLYCERIN  (NITROSTAT ) 0.4 MG SL tablet Place 0.4 mg under the tongue every 5 (five) minutes as needed for chest pain.     omeprazole (PRILOSEC) 20 MG capsule Take 20 mg by mouth 2 (two) times daily.      oxybutynin  (DITROPAN ) 5 MG tablet Take 1 tablet (5 mg total) by mouth 2 (two) times daily as needed for bladder spasms. 15 tablet 1   polyethylene glycol (MIRALAX / GLYCOLAX) 17 g packet Take 17 g by mouth daily.     Probiotic Product (PROBIOTIC PO) Take 1 capsule by mouth daily.     rosuvastatin (CRESTOR) 10 MG tablet Take 10 mg by mouth daily.     simethicone  (MYLICON) 125 MG chewable tablet Chew 125 mg by mouth every 6 (six) hours as needed for flatulence.     tadalafil (CIALIS) 5 MG tablet      Vibegron (GEMTESA) 75 MG TABS Take 1 tablet by mouth daily.     No current facility-administered medications for this visit.    Allergies  Allergen Reactions   Sulfa Antibiotics Other (See Comments)    Happened when he was child. Does not remember reaction.   Demerol Other (See Comments)    Severe headache.   Pseudoephedrine Other (See Comments)    Hypertension     Review of Systems  Constitutional:  Positive for appetite change and fatigue. Negative for activity change  and unexpected weight change.  HENT:  Positive for hearing loss and trouble swallowing (In past associated with reflux). Negative for voice change.   Respiratory:  Negative for cough, shortness of breath and wheezing.   Cardiovascular:  Negative for chest pain and leg swelling.  Gastrointestinal:  Positive for abdominal pain (Reflux, currently controlled) and constipation.   Genitourinary:  Positive for difficulty urinating, frequency and hematuria (Resolved).  Neurological:  Negative for seizures and weakness.  Hematological:  Bruises/bleeds easily (Improved since Plavix  discontinued).  Psychiatric/Behavioral:  Positive for dysphoric mood. The patient is nervous/anxious.   All other systems reviewed and are negative.   BP 110/70   Pulse 84   Resp 20   Ht 5\' 6"  (1.676 m)   Wt 167 lb (75.8 kg)   SpO2 96% Comment: RA  BMI 26.95 kg/m  Physical Exam Vitals reviewed.  Constitutional:      General: He is not in acute distress.    Appearance: Normal appearance.  HENT:     Head: Normocephalic and atraumatic.  Eyes:     General: No scleral icterus.    Extraocular Movements: Extraocular movements intact.  Cardiovascular:     Rate and Rhythm: Normal rate and regular rhythm.     Heart sounds: Normal heart sounds. No murmur heard. Pulmonary:     Effort: Pulmonary effort is normal. No respiratory distress.     Breath sounds: Normal breath sounds. No wheezing.     Comments: Well-healed incisions left chest Abdominal:     General: There is no distension.     Palpations: Abdomen is soft.  Musculoskeletal:     Cervical back: Neck supple.  Lymphadenopathy:     Cervical: No cervical adenopathy.  Skin:    General: Skin is warm and dry.  Neurological:     General: No focal deficit present.     Mental Status: He is alert and oriented to person, place, and time.     Cranial Nerves: No cranial nerve deficit.     Motor: No weakness.   Palpable axillary adenopathy bilaterally left greater than right   Diagnostic Tests: NUCLEAR MEDICINE PET SKULL BASE TO THIGH   TECHNIQUE: 8.4 mCi F-18 FDG was injected intravenously. Full-ring PET imaging was performed from the skull base to thigh after the radiotracer. CT data was obtained and used for attenuation correction and anatomic localization.   Fasting blood glucose: 110 mg/dl   COMPARISON:  CT 21/30/8657    FINDINGS: NECK: No hypermetabolic lymph nodes in the neck.   Incidental CT findings: None.   CHEST: Within the LEFT upper lobe along the surgical suture line there is a 19 mm nodule (image 76) with intense metabolic activity (SUV max equal 15.4)   No hypermetabolic mediastinal lymph nodes. Within the RIGHT upper lobe angular nodule measuring 14 mm not changed from multiple CT exams has relatively low metabolic activity (SUV max equal 2.9).   Enlarged bilateral axillary lymph nodes with fairly intense metabolic activity. For example LEFT axillary node measuring 3.3 cm with SUV max equal 6.8. Lymph node similar in size to 2023 increased slightly from 2022.   Incidental CT findings: None.   ABDOMEN/PELVIS: Spleen is normal size with normal metabolic activity. No hypermetabolic or enlarged abdominopelvic lymph nodes. No evidence of liver metastasis.   Incidental CT findings: Post RIGHT nephrectomy anatomy.   There is intense metabolic activity within the LEFT and RIGHT lobe of the prostate gland SUV max equal 21 c. no hypermetabolic pelvic lymph nodes.  SKELETON: No focal hypermetabolic activity to suggest skeletal metastasis.   Incidental CT findings: None.   IMPRESSION: 1. Hypermetabolic nodule in the LEFT upper lobe along the surgical suture line is consistent with recurrent lung adenocarcinoma. 2. No evidence of mediastinal nodal metastasis. 3. Enlarged bilateral axillary lymph nodes with high metabolic activity consistent with lymphoma. Activity is higher than typical low-grade lymphoma. Similar size to comparison exam. 4. Intense metabolic activity within the prostate gland. Recommend correlation with PSA.     Electronically Signed   By: Deboraha Fallow M.D.   On: 03/26/2024 08:46 I personally reviewed his CT and PET/CT images.  1.5 cm soft tissue density associated with previous staple line in left upper lobe.  Markedly hypermetabolic SUV of 15.4.   Hypermetabolic axillary nodes bilaterally.  Prostate activity.  Coronary calcification.  No mediastinal or hilar adenopathy.  Impression: Bradley Irwin is a 78 year old former smoker with a past medical history significant for bladder cancer, renal cell carcinoma status post nephrectomy, single kidney, chronic lymphocytic leukemia, small lymphocytic lymphoma, CAD, stent in 2006, reflux, gout, hypertension, hyperlipidemia, hypothyroidism, irritable bowel syndrome, PVD, and psoriasis.  He had a left upper lobe wedge resection and node sampling by Dr. Nicanor Barge in 2020 for stage Ia adenocarcinoma.  Left upper lobe nodule-slowly growing nodule in association with prior staple line.  Markedly hypermetabolic PET/CT.  Highly suspicious for local recurrence of his adenocarcinoma.  Has to be considered that less.  Proven otherwise.  Infectious and inflammatory inflammatory nodules are also in the differential, but are far less likely.  Clinical stage recurrent T1, N0, M0, stage Ia.  We discussed potential treatment options for the nodule including surgical resection and radiation.  Surgical resection would involve a redo VATS for left upper lobectomy.  We discussed the relative advantages and disadvantages of each approach.  I described the proposed operation to Mr. and Mrs. Azalia Leo.  The plan would be to do a redo robotic assisted left VATS for left upper lobectomy.  They understand that redo surgery is associated with more difficulty, higher risk for complications, and less definitive outcomes.  High risk for bleeding, air leaks, and recurrence.  They understand the incisions to be used, the use of the surgical robot, possible need for conversion to open, use of drains to postoperatively, the expected hospital stay, and the overall recovery.  I informed them of the indications, risks, benefits, and alternatives.  They understand the risks include, but not limited to death, MI, DVT, PE, bleeding, possible  need for transfusion, infection, prolonged air leak, cardiac arrhythmias, respiratory or renal failure, as well as possibility of other unforeseeable complications.  He is leaning toward surgery but wants to hear more about radiation before making a decision.  Will refer to radiation oncology.  Exercise tolerance is good.  His PFTs were almost normal prior to his previous surgery but will need to be repeated prior to redo surgery.  Will go ahead and order those.  CAD-history of a stent in 2006.  No anginal symptoms at present.  He saw Dr. Abel Hoe in February.  No need for additional cardiac workup prior to lung surgery.  He understands he does have moderate risk of cardiovascular complications due to his known coronary disease.  Bladder cancer-followed by Dr. Derrick Fling.  Currently undergoing BCG treatments.  He is concerned about how surgery would possibly delay those treatments.  Plan: Repeat PFTs with and without bronchodilators Referral to radiation oncology to discuss potential for radiation Patient will let us  know  how he would like to proceed.  Zelphia Higashi, MD [facility] [phone]

## 2024-04-15 NOTE — Telephone Encounter (Signed)
Left message for patient to call back to schedule consult per 4/22 referral.

## 2024-04-16 ENCOUNTER — Other Ambulatory Visit: Payer: Self-pay | Admitting: Physician Assistant

## 2024-04-16 DIAGNOSIS — C786 Secondary malignant neoplasm of retroperitoneum and peritoneum: Secondary | ICD-10-CM

## 2024-04-17 ENCOUNTER — Encounter: Payer: Self-pay | Admitting: Medical Oncology

## 2024-04-21 ENCOUNTER — Encounter: Payer: Self-pay | Admitting: Urology

## 2024-04-21 NOTE — Progress Notes (Addendum)
 Radiation Oncology         [phone] ________________________________  Name: Bradley Irwin        MRN: [id number]  Date of Service: 04/23/2024 DOB: May 04, 1946  WU:[id number], [name], MD  Zelphia Higashi, *     REFERRING PHYSICIAN: Zelphia Higashi, *   DIAGNOSIS: The primary encounter diagnosis was Malignant neoplasm of right kidney, except renal pelvis (HCC). A diagnosis of Malignant neoplasm of upper lobe of left lung (HCC) was also pertinent to this visit.   HISTORY OF PRESENT ILLNESS: Bradley Irwin is a 78 y.o. male seen at the request of  Dr. Luna Salinas for concerns of local recurrence of lung cancer.  The patient has a history of Stage IA,2, pT1bN0M0, NSCLC, adenocarcinoma of the left upper lobe which was treated in June 2020 by Dr. Nicanor Barge with wedge resection.  He has been followed in surveillance with Dr. 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 He also underwent a right nephrectomy in 2008.  Recently and his surveillance for lung cancer he was found to have nodular soft tissue lesion next to his left upper lobe staple line.  In August 2024 by CT imaging it measured 12 mm, but follow-up on 02/20/2024 measured the area up to 18 mm.  He has also had stable subsolid nodular change in the right upper lobe measuring 16 mm and a PET on 03/14/2024 showed hypermetabolic activity in the left upper lobe soft tissue nodule at the surgical suture line.  No other abnormalities were noted in the right lung, and no mediastinal adenopathy was appreciated but enlarged bilateral axillary nodes with high metabolic activity consistent with his known lymphoma were similar to size on prior examination and intense hypermetabolic activity of the prostate gland was also noted.  He is scheduled to undergo a prostate MRI next week.  He  met with Dr. Luna Salinas to discuss additional surgical resection which would involve a redo VATS left upper lobectomy and while he is still considering surgical options is seen to discuss stereotactic body radiotherapy as an alternative    PREVIOUS RADIATION THERAPY: No   PAST MEDICAL HISTORY:  Past Medical History:  Diagnosis Date   Anxiety    Atypical mole 06/08/2010   Right Abdomen(moderate) [name])   Atypical mole 04/25/1995   Left Upper ShoulderBlade(slight)   Atypical mole 12/31/2013   Left Mid Back(Moderate) [name])   Bladder cancer (HCC)    CLL (chronic lymphocytic leukemia) (HCC)    Colon polyps    Coronary artery disease    Eczema    GERD (gastroesophageal reflux disease)    Gout    Hearing loss    Hx of cardiovascular stress test    Lexiscan  Myoview  5/16:  The study is normal. No ischemia identified. This is a low risk study. Overall left ventricular systolic function was normal. LV cavity size is normal. The left ventricular ejection fraction is normal (55-65%). Sensitivity reduced by bowel loop attenuation artifact.   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   Pneumonia    Psoriasis    PVD (peripheral vascular disease) (HCC)    Renal cell cancer (HCC) 2007  Squamous cell carcinoma in situ    Wears glasses    Wears hearing aid    both ears       PAST SURGICAL HISTORY: Past Surgical History:  Procedure Laterality Date   CARDIAC CATHETERIZATION     CHOLECYSTECTOMY  10/26/2011   COLONOSCOPY     CORONARY STENT PLACEMENT  2006   CYSTOSCOPY W/ RETROGRADES Left 10/02/2023   Procedure: CYSTOSCOPY WITH RETROGRADE PYELOGRAM;  Surgeon: Christina Coyer, MD;  Location: [facility];  Service: Urology;  Laterality: Left;  60 MINS FOR CASE   CYSTOSCOPY WITH BIOPSY N/A 10/02/2023   Procedure: CYSTOSCOPY WITH BLADDER BIOPSY  FULGURATION;  Surgeon: Christina Coyer, MD;  Location: [facility];  Service: Urology;  Laterality: N/A;   CYSTOSCOPY WITH BIOPSY Left 10/30/2023   Procedure: CYSTOSCOPY WITH RETROROGRADE;  Surgeon: Christina Coyer, MD;  Location: [facility];  Service: Urology;  Laterality: Left;   CYSTOSCOPY WITH URETEROSCOPY AND STENT PLACEMENT Left 10/30/2023   Procedure: CYSTOSCOPY WITH  LEFT URETEROSCOPY AND LEFT URETERAL STENT EXCHANGE;  Surgeon: Christina Coyer, MD;  Location: [facility];  Service: Urology;  Laterality: Left;  75 MINUTES   CYSTOSCOPY/URETEROSCOPY/HOLMIUM LASER/STENT PLACEMENT Left 10/02/2023   Procedure: CYSTOSCOPY, LEFT DIAGNOSTIC URETEROSCOPY/STENT PLACEMENT;  Surgeon: Christina Coyer, MD;  Location: [facility];  Service: Urology;  Laterality: Left;   FINGER SURGERY Right 78 y/o   cut index rt   INTERCOSTAL NERVE BLOCK  06/16/2019   Procedure: Intercostal Nerve Block;  Surgeon: Norita Beauvais, MD;  Location: [facility];  Service: Thoracic;;   LIPOMA EXCISION Left 11/18/2013   Procedure: EXCISION POSTERIOR NECK LIPOMA ;  Surgeon: Lockie Rima, MD;  Location: [facility];  Service: General;  Laterality: Left;   LIVER BIOPSY  09/18/2011   LIVER LOBECTOMY  10/26/2011   LYMPH NODE DISSECTION  06/16/2019   Procedure: Lymph Node Dissection;  Surgeon: Norita Beauvais, MD;  Location: [facility] OR;  Service: Thoracic;;   NEPHRECTOMY Right 2008   right   TONSILLECTOMY     VIDEO ASSISTED THORACOSCOPY (VATS)/WEDGE RESECTION Left 06/16/2019   Procedure: VIDEO ASSISTED THORACOSCOPY (VATS)/MINI THOROCOTOMY WITH LEFT UPPER LOBE WEDGE RESECTION;  Surgeon: Norita Beauvais, MD;  Location: [facility];  Service: Thoracic;  Laterality: Left;   VIDEO BRONCHOSCOPY N/A 06/16/2019   Procedure: VIDEO BRONCHOSCOPY;  Surgeon: Norita Beauvais, MD;  Location: [facility] OR;  Service: Thoracic;  Laterality: N/A;     FAMILY HISTORY:  Family History  Problem Relation Age of Onset    Hypertension Mother    Heart failure Mother    Heart disease Mother    Aneurysm Mother        brain   Peripheral vascular disease Father    Lung cancer Father    Hypertension Sister    Breast cancer Sister    Heart attack Maternal Grandfather    Heart attack Maternal Uncle    Colon cancer Neg Hx      SOCIAL HISTORY:  reports that he quit smoking about 40 years ago. His smoking use included cigarettes. He started smoking about 55 years ago. He has a 15 pack-year smoking history. He has never used smokeless tobacco. He reports that he does not currently use alcohol  after a past usage of about 14.0 - 20.0 standard drinks of alcohol  per week. He reports that he does not use drugs.   ALLERGIES: Sulfa antibiotics, Demerol, and Pseudoephedrine   MEDICATIONS:  Current Outpatient Medications  Medication Sig Dispense Refill   acetaminophen  (  TYLENOL ) 500 MG tablet Take 500 mg by mouth at bedtime.     alfuzosin (UROXATRAL) 10 MG 24 hr tablet Take 10 mg by mouth daily.     allopurinol (ZYLOPRIM) 100 MG tablet Take 100 mg by mouth daily.     ALPRAZolam  (XANAX ) 0.5 MG tablet Take 0.5 mg by mouth 2 (two) times daily as needed for anxiety.      amitriptyline (ELAVIL) 10 MG tablet Take 10 mg by mouth at bedtime as needed.     aspirin  EC 81 MG tablet Take 81 mg by mouth daily. Swallow whole.     clidinium-chlordiazePOXIDE  (LIBRAX ) 5-2.5 MG capsule Take 1 capsule by mouth daily as needed (ibs).     Colchicine 0.6 MG CAPS Take 1 tablet by mouth as needed (gout).     Cranberry-Vitamin C-Probiotic (AZO CRANBERRY PO) Take 2 tablets by mouth daily.     diclofenac Sodium (VOLTAREN) 1 % GEL Apply 1 Application topically 4 (four) times daily as needed (pain).     dicyclomine (BENTYL) 20 MG tablet Take 20 mg by mouth daily as needed for spasms.     Docosanol (ABREVA) 10 % CREA Apply 1 Application topically daily as needed (fever blisters).     Docusate Sodium  (DSS) 100 MG CAPS Take 100 mg by mouth 2 (two)  times daily as needed (constipation).     ferrous sulfate 325 (65 FE) MG tablet Take 325 mg by mouth daily.     fluticasone (FLONASE) 50 MCG/ACT nasal spray Place 1 spray into both nostrils daily.     hydrOXYzine (ATARAX) 25 MG tablet Take 25 mg by mouth as needed for itching.     levothyroxine  (SYNTHROID ) 137 MCG tablet Take 137 mcg by mouth every morning.     Methenamine-Sodium Salicylate (AZO URINARY TRACT DEFENSE) 162-162.5 MG TABS Take 1 tablet by mouth as needed (urinary pain).     nitroGLYCERIN  (NITROSTAT ) 0.4 MG SL tablet Place 0.4 mg under the tongue every 5 (five) minutes as needed for chest pain.     omeprazole (PRILOSEC) 20 MG capsule Take 20 mg by mouth 2 (two) times daily.      oxybutynin  (DITROPAN ) 5 MG tablet Take 1 tablet (5 mg total) by mouth 2 (two) times daily as needed for bladder spasms. 15 tablet 1   polyethylene glycol (MIRALAX / GLYCOLAX) 17 g packet Take 17 g by mouth daily.     Probiotic Product (PROBIOTIC PO) Take 1 capsule by mouth daily.     rosuvastatin (CRESTOR) 10 MG tablet Take 10 mg by mouth daily.     simethicone  (MYLICON) 125 MG chewable tablet Chew 125 mg by mouth every 6 (six) hours as needed for flatulence.     tadalafil (CIALIS) 5 MG tablet      Vibegron (GEMTESA) 75 MG TABS Take 1 tablet by mouth daily.     No current facility-administered medications for this encounter.     REVIEW OF SYSTEMS: On review of systems, the patient reports that he is doing well overall. He denies any chest pain, shortness of breath, cough, fevers, chills, night sweats, unintended weight changes.     PHYSICAL EXAM:  Wt Readings from Last 3 Encounters:  04/23/24 168 lb (76.2 kg)  04/15/24 167 lb (75.8 kg)  03/26/24 167 lb 1.6 oz (75.8 kg)   Temp Readings from Last 3 Encounters:  04/23/24 (!) 97.2 F (36.2 C)  03/26/24 98.3 F (36.8 C) (Temporal)  03/05/24 98 F (36.7 C) (Temporal)   BP Readings  from Last 3 Encounters:  04/23/24 (!) 127/58  04/15/24 110/70   03/26/24 115/62   Pulse Readings from Last 3 Encounters:  04/23/24 82  04/15/24 84  03/26/24 90   Pain Assessment Pain Score: 2  Pain Loc: Foot/10  In general this is a well appearing male in no acute distress. He's alert and oriented x4 and appropriate throughout the examination. Cardiopulmonary assessment is negative for acute distress and he exhibits normal effort.     ECOG = 0  0 - Asymptomatic (Fully active, able to carry on all predisease activities without restriction)  1 - Symptomatic but completely ambulatory (Restricted in physically strenuous activity but ambulatory and able to carry out work of a light or sedentary nature. For example, light housework, office work)  2 - Symptomatic, <50% in bed during the day (Ambulatory and capable of all self care but unable to carry out any work activities. Up and about more than 50% of waking hours)  3 - Symptomatic, >50% in bed, but not bedbound (Capable of only limited self-care, confined to bed or chair 50% or more of waking hours)  4 - Bedbound (Completely disabled. Cannot carry on any self-care. Totally confined to bed or chair)  5 - Death   Aurea Blossom MM, Creech RH, Tormey DC, et al. [id number]). "Toxicity and response criteria of the [facility]". Am. Hillard Lowes. Oncol. 5 (6): 649-55    LABORATORY DATA:  Lab Results  Component Value Date   WBC 10.4 03/26/2024   HGB 12.4 (L) 03/26/2024   HCT 38.8 (L) 03/26/2024   MCV 82.0 03/26/2024   PLT 147 (L) 03/26/2024   Lab Results  Component Value Date   NA 138 03/26/2024   K 4.1 03/26/2024   CL 107 03/26/2024   CO2 23 03/26/2024   Lab Results  Component Value Date   ALT 18 03/26/2024   AST 25 03/26/2024   ALKPHOS 153 (H) 03/26/2024   BILITOT 0.4 03/26/2024      RADIOGRAPHY: No results found.     IMPRESSION/PLAN: 1. Locally recurrent Stage IA,2, pT1bN0M0, NSCLC, adenocarcinoma of the left upper lobe  Dr. Jeryl Moris discusses the pathology findings and  reviews the nature of localized recurrence involving the lung.  He discusses that if patients are not consider surgical candidates to resect localized recurrence, the alternative would be to consider a course of stereotactic body radiotherapy (SBRT). He discussed the risks, benefits, short, and long term effects of radiotherapy, as well as the curative intent, and the patient Dr. Jeryl Moris discusses the delivery and logistics of radiotherapy and if he were to proceed, anticipates a course of 3-5 fractions to the left upper lobe target at this suture line.  The patient is eager to avoid another surgery and would like to proceed with radiation treatment at this time. A consent form was signed and placed in his chart today. We will get him scheduled for CT simulation in the near future.    2. History of lymphoma, noninvasive bladder and renal carcinomas. Patient's last known genetic testing was performed in 2020. Referral to genetics placed today for possible repeat testing.    In a visit lasting 60 minutes, greater than 50% of the time was spent face to face discussing the patient's condition, in preparation for the discussion, and coordinating the patient's care.   The above documentation reflects my direct findings during this shared patient visit. Please see the separate note by Dr. Jeryl Moris on this date for the remainder of the  patient's plan of care.    Julio Ohm, PA-C   **Disclaimer: This note was dictated with voice recognition software. Similar sounding words can inadvertently be transcribed and this note may contain transcription errors which may not have been corrected upon publication of note.**

## 2024-04-22 ENCOUNTER — Encounter: Admitting: Thoracic Surgery (Cardiothoracic Vascular Surgery)

## 2024-04-22 NOTE — Progress Notes (Signed)
 Thoracic Location of Tumor / Histology: Recurrent Lung Cancer  Patient presented for surveillance scans for lung cancer.  He was found to have nodular soft tissue next to his left upper lobe staple line.  CT imaging in August 2024 measured the area at 12 mm with more recent imaging 01/2024 measured the area t up to 18 mm.  PET 03/14/2024  Biopsies of    Past/Anticipated interventions by cardiothoracic surgery, if any:  Dr. Luna Salinas 04/15/2024 -We discussed potential treatment options for the nodule including surgical resection and radiation. Surgical resection would involve a redo VATS for left upper lobectomy.  -He is leaning toward surgery but wants to hear more about radiation before making a decision. Will refer to radiation oncology.   Dr. Nicanor Barge- Left Upper Lobe wedge resection 2020.   Past/Anticipated interventions by medical oncology, if any:    Tobacco/Marijuana/Snuff/ETOH use: Former Smoker   Signs/Symptoms Weight changes, if any: No changes in the past month. Respiratory complaints, if any: No complaints  Hemoptysis, if any: None Pain issues, if any:  Denies chest pain, pressure, or tightness.  SAFETY ISSUES: Prior radiation? No Pacemaker/ICD? No Possible current pregnancy? N/a Is the patient on methotrexate? No  Current Complaints / other details:   Kidney, bladder, possible prostate cancer  Stent in left

## 2024-04-23 ENCOUNTER — Ambulatory Visit
Admission: RE | Admit: 2024-04-23 | Discharge: 2024-04-23 | Disposition: A | Discharge status: Home / Self Care | Source: Ambulatory Visit | Attending: Radiation Oncology | Admitting: Radiation Oncology

## 2024-04-23 ENCOUNTER — Encounter: Payer: Self-pay | Admitting: Radiation Oncology

## 2024-04-23 ENCOUNTER — Ambulatory Visit
Admission: RE | Admit: 2024-04-23 | Discharge: 2024-04-23 | Discharge status: Home / Self Care | Source: Ambulatory Visit | Attending: Radiation Oncology | Admitting: Radiation Oncology

## 2024-04-23 VITALS — BP 127/58 | HR 82 | Temp 97.2°F | Resp 18 | Ht 66.0 in | Wt 168.0 lb

## 2024-04-23 DIAGNOSIS — Z87891 Personal history of nicotine dependence: Secondary | ICD-10-CM | POA: Insufficient documentation

## 2024-04-23 DIAGNOSIS — K219 Gastro-esophageal reflux disease without esophagitis: Secondary | ICD-10-CM | POA: Insufficient documentation

## 2024-04-23 DIAGNOSIS — Z801 Family history of malignant neoplasm of trachea, bronchus and lung: Secondary | ICD-10-CM | POA: Insufficient documentation

## 2024-04-23 DIAGNOSIS — Z803 Family history of malignant neoplasm of breast: Secondary | ICD-10-CM | POA: Diagnosis not present

## 2024-04-23 DIAGNOSIS — C9111 Chronic lymphocytic leukemia of B-cell type in remission: Secondary | ICD-10-CM | POA: Diagnosis not present

## 2024-04-23 DIAGNOSIS — C641 Malignant neoplasm of right kidney, except renal pelvis: Secondary | ICD-10-CM

## 2024-04-23 DIAGNOSIS — C3412 Malignant neoplasm of upper lobe, left bronchus or lung: Secondary | ICD-10-CM | POA: Insufficient documentation

## 2024-04-23 DIAGNOSIS — Z905 Acquired absence of kidney: Secondary | ICD-10-CM | POA: Insufficient documentation

## 2024-04-23 DIAGNOSIS — E039 Hypothyroidism, unspecified: Secondary | ICD-10-CM | POA: Diagnosis not present

## 2024-04-23 DIAGNOSIS — C3411 Malignant neoplasm of upper lobe, right bronchus or lung: Secondary | ICD-10-CM | POA: Diagnosis not present

## 2024-04-23 DIAGNOSIS — Z79899 Other long term (current) drug therapy: Secondary | ICD-10-CM | POA: Insufficient documentation

## 2024-04-23 DIAGNOSIS — Z79890 Hormone replacement therapy (postmenopausal): Secondary | ICD-10-CM | POA: Insufficient documentation

## 2024-04-23 DIAGNOSIS — I10 Essential (primary) hypertension: Secondary | ICD-10-CM | POA: Insufficient documentation

## 2024-04-23 DIAGNOSIS — E785 Hyperlipidemia, unspecified: Secondary | ICD-10-CM | POA: Diagnosis not present

## 2024-04-23 DIAGNOSIS — Z860101 Personal history of adenomatous and serrated colon polyps: Secondary | ICD-10-CM | POA: Insufficient documentation

## 2024-04-23 DIAGNOSIS — I2510 Atherosclerotic heart disease of native coronary artery without angina pectoris: Secondary | ICD-10-CM | POA: Insufficient documentation

## 2024-04-25 DIAGNOSIS — C3411 Malignant neoplasm of upper lobe, right bronchus or lung: Secondary | ICD-10-CM | POA: Diagnosis not present

## 2024-04-25 DIAGNOSIS — Z87891 Personal history of nicotine dependence: Secondary | ICD-10-CM | POA: Diagnosis not present

## 2024-04-29 ENCOUNTER — Ambulatory Visit
Admission: RE | Admit: 2024-04-29 | Discharge: 2024-04-29 | Disposition: A | Discharge status: Home / Self Care | Source: Ambulatory Visit | Attending: Radiation Oncology | Admitting: Radiation Oncology

## 2024-04-29 DIAGNOSIS — C3412 Malignant neoplasm of upper lobe, left bronchus or lung: Secondary | ICD-10-CM | POA: Insufficient documentation

## 2024-04-29 DIAGNOSIS — Z87891 Personal history of nicotine dependence: Secondary | ICD-10-CM | POA: Diagnosis not present

## 2024-04-29 DIAGNOSIS — Z510 Encounter for antineoplastic radiation therapy: Secondary | ICD-10-CM | POA: Diagnosis not present

## 2024-04-29 DIAGNOSIS — C3411 Malignant neoplasm of upper lobe, right bronchus or lung: Secondary | ICD-10-CM | POA: Diagnosis not present

## 2024-04-30 ENCOUNTER — Ambulatory Visit
Admission: RE | Admit: 2024-04-30 | Discharge: 2024-04-30 | Disposition: A | Discharge status: Home / Self Care | Source: Ambulatory Visit | Attending: Urology | Admitting: Urology

## 2024-04-30 DIAGNOSIS — N403 Nodular prostate with lower urinary tract symptoms: Secondary | ICD-10-CM

## 2024-04-30 DIAGNOSIS — N402 Nodular prostate without lower urinary tract symptoms: Secondary | ICD-10-CM | POA: Diagnosis not present

## 2024-04-30 MED ORDER — GADOPICLENOL 0.5 MMOL/ML IV SOLN
10.0000 mL | Freq: Once | INTRAVENOUS | Status: AC | PRN
  Administered 2024-04-30: 10 mL via INTRAVENOUS

## 2024-05-06 DIAGNOSIS — Z510 Encounter for antineoplastic radiation therapy: Secondary | ICD-10-CM | POA: Diagnosis not present

## 2024-05-06 DIAGNOSIS — Z87891 Personal history of nicotine dependence: Secondary | ICD-10-CM | POA: Diagnosis not present

## 2024-05-06 DIAGNOSIS — C3411 Malignant neoplasm of upper lobe, right bronchus or lung: Secondary | ICD-10-CM | POA: Diagnosis not present

## 2024-05-06 DIAGNOSIS — C3412 Malignant neoplasm of upper lobe, left bronchus or lung: Secondary | ICD-10-CM | POA: Diagnosis not present

## 2024-05-07 ENCOUNTER — Encounter: Payer: Self-pay | Admitting: Physician Assistant

## 2024-05-07 ENCOUNTER — Other Ambulatory Visit: Payer: Self-pay | Admitting: Physician Assistant

## 2024-05-07 ENCOUNTER — Ambulatory Visit
Admission: RE | Admit: 2024-05-07 | Discharge: 2024-05-07 | Disposition: A | Discharge status: Home / Self Care | Source: Ambulatory Visit | Attending: Radiation Oncology | Admitting: Radiation Oncology

## 2024-05-07 ENCOUNTER — Other Ambulatory Visit: Payer: Self-pay

## 2024-05-07 DIAGNOSIS — C3412 Malignant neoplasm of upper lobe, left bronchus or lung: Secondary | ICD-10-CM | POA: Diagnosis not present

## 2024-05-07 DIAGNOSIS — Z510 Encounter for antineoplastic radiation therapy: Secondary | ICD-10-CM | POA: Diagnosis not present

## 2024-05-07 LAB — RAD ONC ARIA SESSION SUMMARY
Course Elapsed Days: 0
Plan Fractions Treated to Date: 1
Plan Prescribed Dose Per Fraction: 12 Gy
Plan Total Fractions Prescribed: 5
Plan Total Prescribed Dose: 60 Gy
Reference Point Dosage Given to Date: 12 Gy
Reference Point Session Dosage Given: 12 Gy
Session Number: 1

## 2024-05-08 ENCOUNTER — Ambulatory Visit

## 2024-05-09 ENCOUNTER — Ambulatory Visit
Admission: RE | Admit: 2024-05-09 | Discharge: 2024-05-09 | Disposition: A | Discharge status: Home / Self Care | Source: Ambulatory Visit | Attending: Radiation Oncology | Admitting: Radiation Oncology

## 2024-05-09 ENCOUNTER — Other Ambulatory Visit: Payer: Self-pay

## 2024-05-09 DIAGNOSIS — C3412 Malignant neoplasm of upper lobe, left bronchus or lung: Secondary | ICD-10-CM | POA: Diagnosis not present

## 2024-05-09 DIAGNOSIS — Z510 Encounter for antineoplastic radiation therapy: Secondary | ICD-10-CM | POA: Diagnosis not present

## 2024-05-09 LAB — RAD ONC ARIA SESSION SUMMARY
Course Elapsed Days: 2
Plan Fractions Treated to Date: 2
Plan Prescribed Dose Per Fraction: 12 Gy
Plan Total Fractions Prescribed: 5
Plan Total Prescribed Dose: 60 Gy
Reference Point Dosage Given to Date: 24 Gy
Reference Point Session Dosage Given: 12 Gy
Session Number: 2

## 2024-05-12 ENCOUNTER — Other Ambulatory Visit: Payer: Self-pay

## 2024-05-12 ENCOUNTER — Ambulatory Visit
Admission: RE | Admit: 2024-05-12 | Discharge: 2024-05-12 | Disposition: A | Discharge status: Home / Self Care | Source: Ambulatory Visit | Attending: Radiation Oncology | Admitting: Radiation Oncology

## 2024-05-12 DIAGNOSIS — Z510 Encounter for antineoplastic radiation therapy: Secondary | ICD-10-CM | POA: Diagnosis not present

## 2024-05-12 DIAGNOSIS — C3412 Malignant neoplasm of upper lobe, left bronchus or lung: Secondary | ICD-10-CM | POA: Diagnosis not present

## 2024-05-12 LAB — RAD ONC ARIA SESSION SUMMARY
Course Elapsed Days: 5
Plan Fractions Treated to Date: 3
Plan Prescribed Dose Per Fraction: 12 Gy
Plan Total Fractions Prescribed: 5
Plan Total Prescribed Dose: 60 Gy
Reference Point Dosage Given to Date: 36 Gy
Reference Point Session Dosage Given: 12 Gy
Session Number: 3

## 2024-05-13 ENCOUNTER — Ambulatory Visit

## 2024-05-14 ENCOUNTER — Other Ambulatory Visit: Payer: Self-pay

## 2024-05-14 ENCOUNTER — Ambulatory Visit
Admission: RE | Admit: 2024-05-14 | Discharge: 2024-05-14 | Disposition: A | Discharge status: Home / Self Care | Source: Ambulatory Visit | Attending: Radiation Oncology | Admitting: Radiation Oncology

## 2024-05-14 DIAGNOSIS — C3412 Malignant neoplasm of upper lobe, left bronchus or lung: Secondary | ICD-10-CM | POA: Diagnosis not present

## 2024-05-14 DIAGNOSIS — Z510 Encounter for antineoplastic radiation therapy: Secondary | ICD-10-CM | POA: Diagnosis not present

## 2024-05-14 LAB — RAD ONC ARIA SESSION SUMMARY
Course Elapsed Days: 7
Plan Fractions Treated to Date: 4
Plan Prescribed Dose Per Fraction: 12 Gy
Plan Total Fractions Prescribed: 5
Plan Total Prescribed Dose: 60 Gy
Reference Point Dosage Given to Date: 48 Gy
Reference Point Session Dosage Given: 12 Gy
Session Number: 4

## 2024-05-16 ENCOUNTER — Ambulatory Visit
Admission: RE | Admit: 2024-05-16 | Discharge: 2024-05-16 | Disposition: A | Discharge status: Home / Self Care | Source: Ambulatory Visit | Attending: Radiation Oncology | Admitting: Radiation Oncology

## 2024-05-16 ENCOUNTER — Other Ambulatory Visit: Payer: Self-pay

## 2024-05-16 DIAGNOSIS — C3412 Malignant neoplasm of upper lobe, left bronchus or lung: Secondary | ICD-10-CM | POA: Diagnosis not present

## 2024-05-16 DIAGNOSIS — C3411 Malignant neoplasm of upper lobe, right bronchus or lung: Secondary | ICD-10-CM | POA: Diagnosis not present

## 2024-05-16 DIAGNOSIS — Z510 Encounter for antineoplastic radiation therapy: Secondary | ICD-10-CM | POA: Diagnosis not present

## 2024-05-16 DIAGNOSIS — Z87891 Personal history of nicotine dependence: Secondary | ICD-10-CM | POA: Diagnosis not present

## 2024-05-16 LAB — RAD ONC ARIA SESSION SUMMARY
Course Elapsed Days: 9
Plan Fractions Treated to Date: 5
Plan Prescribed Dose Per Fraction: 12 Gy
Plan Total Fractions Prescribed: 5
Plan Total Prescribed Dose: 60 Gy
Reference Point Dosage Given to Date: 60 Gy
Reference Point Session Dosage Given: 12 Gy
Session Number: 5

## 2024-05-20 DIAGNOSIS — N1832 Chronic kidney disease, stage 3b: Secondary | ICD-10-CM | POA: Diagnosis not present

## 2024-05-20 NOTE — Radiation Completion Notes (Addendum)
  Radiation Oncology         (336) [phone] ________________________________  Name: Bradley Irwin MRN: [id number]  Date of Service: 05/16/2024  DOB: 1946-11-30  End of Treatment Note  Diagnosis: Locally recurrent Stage IA,2, pT1bN0M0, NSCLC, adenocarcinoma of the left upper lobe   Intent: Curative     ==========DELIVERED PLANS==========  First Treatment Date: 2024-05-07 Last Treatment Date: 2024-05-16   Plan Name: Lung_L_SBRT Site: Lung, LUL Technique: SBRT/SRT-IMRT Mode: Photon Dose Per Fraction: 12 Gy Prescribed Dose (Delivered / Prescribed): 60 Gy / 60 Gy Prescribed Fxs (Delivered / Prescribed): 5 / 5     ==========ON TREATMENT VISIT DATES========== 2024-05-07, 2024-05-09, 2024-05-12, 2024-05-14, 2024-05-16, 2024-05-16    See weekly On Treatment Notes in Epic for details in the Media tab (listed as Progress notes on the On Treatment Visit Dates listed above). The patient tolerated radiation.    The patient will receive a call in about one month from the radiation oncology department. We will coordinate his next post treatment CT scan which has been ordered. If stable he will continue in surveillance with our clinic.     Shelvia Dick, PAC

## 2024-05-26 ENCOUNTER — Other Ambulatory Visit

## 2024-05-26 DIAGNOSIS — C679 Malignant neoplasm of bladder, unspecified: Secondary | ICD-10-CM | POA: Diagnosis not present

## 2024-05-26 DIAGNOSIS — N1832 Chronic kidney disease, stage 3b: Secondary | ICD-10-CM | POA: Diagnosis not present

## 2024-05-26 DIAGNOSIS — N2581 Secondary hyperparathyroidism of renal origin: Secondary | ICD-10-CM | POA: Diagnosis not present

## 2024-05-26 DIAGNOSIS — I129 Hypertensive chronic kidney disease with stage 1 through stage 4 chronic kidney disease, or unspecified chronic kidney disease: Secondary | ICD-10-CM | POA: Diagnosis not present

## 2024-05-26 DIAGNOSIS — C3490 Malignant neoplasm of unspecified part of unspecified bronchus or lung: Secondary | ICD-10-CM | POA: Diagnosis not present

## 2024-05-26 DIAGNOSIS — E785 Hyperlipidemia, unspecified: Secondary | ICD-10-CM | POA: Diagnosis not present

## 2024-06-04 DIAGNOSIS — C672 Malignant neoplasm of lateral wall of bladder: Secondary | ICD-10-CM | POA: Diagnosis not present

## 2024-06-11 DIAGNOSIS — C672 Malignant neoplasm of lateral wall of bladder: Secondary | ICD-10-CM | POA: Diagnosis not present

## 2024-06-13 ENCOUNTER — Telehealth: Payer: Self-pay

## 2024-06-13 NOTE — Telephone Encounter (Signed)
 Attempted to reach patient regarding a message received from the nurse in radiation, per patient's request for a call. LVM asking patient to return call.

## 2024-06-13 NOTE — Telephone Encounter (Signed)
 Patient reports that following BCG treatments (received at [facility]), he experiences hematuria lasting approximately 24 hours post-treatment. Patient stated that the urologist is aware this has occurred previously. Advised patient to contact the urology office to inform them that these symptoms are recurring. Patient voiced understanding.  Also informed patient that he has a scan scheduled for 08/20/24 and will need a follow-up appointment with Dr. Marguerita Shih approximately one week after. Message sent to scheduler via secure chat to arrange follow-up.

## 2024-06-13 NOTE — Progress Notes (Signed)
  Radiation Oncology         (336) [phone] ________________________________  Name: Bradley Irwin MRN: [id number]  Date of Service: 06/16/2024  DOB: [birth year]  Post Treatment Telephone Note  Diagnosis:  Locally recurrent Stage IA,2, pT1bN0M0, NSCLC, adenocarcinoma of the left upper lobe (as documented in provider EOT note)  The patient was available for call today.   Symptoms of fatigue have improved mildly since completing therapy.  Symptoms of skin changes have improved since completing therapy.  Symptoms of esophagitis have improved since completing therapy.  The patient does not have follow up with his medical oncologist Dr. Marlene Simas for ongoing care, and was encouraged to call if he  develops concerns or questions regarding radiation.   This concludes the interaction.  Avery Bodo, LPN

## 2024-06-16 ENCOUNTER — Encounter: Payer: Self-pay | Admitting: Genetic Counselor

## 2024-06-16 ENCOUNTER — Inpatient Hospital Stay

## 2024-06-16 ENCOUNTER — Inpatient Hospital Stay: Attending: Internal Medicine | Admitting: Genetic Counselor

## 2024-06-16 ENCOUNTER — Ambulatory Visit
Admission: RE | Admit: 2024-06-16 | Discharge: 2024-06-16 | Disposition: A | Discharge status: Home / Self Care | Source: Ambulatory Visit | Attending: Radiation Oncology | Admitting: Radiation Oncology

## 2024-06-16 DIAGNOSIS — Z803 Family history of malignant neoplasm of breast: Secondary | ICD-10-CM

## 2024-06-16 DIAGNOSIS — C679 Malignant neoplasm of bladder, unspecified: Secondary | ICD-10-CM

## 2024-06-16 DIAGNOSIS — C3412 Malignant neoplasm of upper lobe, left bronchus or lung: Secondary | ICD-10-CM | POA: Diagnosis not present

## 2024-06-16 DIAGNOSIS — C9110 Chronic lymphocytic leukemia of B-cell type not having achieved remission: Secondary | ICD-10-CM | POA: Diagnosis not present

## 2024-06-16 DIAGNOSIS — C641 Malignant neoplasm of right kidney, except renal pelvis: Secondary | ICD-10-CM

## 2024-06-16 DIAGNOSIS — C439 Malignant melanoma of skin, unspecified: Secondary | ICD-10-CM

## 2024-06-16 NOTE — Progress Notes (Signed)
 REFERRING PROVIDER: Wyatt Leeroy HERO, PA-C [address] [place],  [place] [address]  PRIMARY PROVIDER:  Stephane Leita DEL, MD  PRIMARY REASON FOR VISIT:  1. Malignant neoplasm of right kidney, except renal pelvis (HCC)   2. Malignant neoplasm of upper lobe of left lung (HCC)   3. CLL (chronic lymphocytic leukemia) (HCC)   4. Melanoma of skin (HCC)   5. Malignant neoplasm of urinary bladder, unspecified site (HCC)   6. Family history of malignant neoplasm of breast      HISTORY OF PRESENT ILLNESS:   Mr. Sayegh, a 78 y.o. male, was seen for a [place] cancer genetics consultation at the request of Dr. Wyatt due to a personal and family history of cancer.  Mr. Dockter presents to clinic today to discuss the possibility of a hereditary predisposition to cancer, to discuss genetic testing, and to further clarify his future cancer risks, as well as potential cancer risks for family members.   Mr. 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 He was diagnosed with melanoma in situ of the scalp at age 77. He was diagnosed with left adenocarcinoma of the lung at age 17. He was diagnosed with chronic lymphocytic leukemia/small lymphocytic lymphoma at age 66. He was diagnosed with carcinoma in situ of the bladder at age 68.    He does report history of cigarette use in high school and while he served in the [employer]. He worked on an [employer], involved in Advice worker. He reports the ship he lived on during his service had asbestos. 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   CANCER HISTORY:  Oncology History  Lung cancer  left upper lobe (HCC)  06/16/2019 Initial Diagnosis   Lung cancer  left upper lobe (HCC)   06/17/2019 Cancer Staging   Staging form: Lung, AJCC 8th Edition - Pathologic stage from 06/17/2019: Stage IA2 (pT1b, pN0, cM0) - Signed by Army Dallas NOVAK, MD on 06/17/2019      RELEVANT MEDICAL HISTORY:  Concern for prostate cancer  2025, MRI normal  Colonoscopy 2024, four polyps detected. Colonoscopy 2014, no polyps.  Annual dermatology.   Past Medical History:  Diagnosis Date   Anxiety    Atypical mole 06/08/2010   Right Abdomen(moderate) terril)   Atypical mole 04/25/1995   Left Upper ShoulderBlade(slight)   Atypical mole 12/31/2013   Left Mid Back(Moderate) terril)   Bladder cancer (HCC)    CLL (chronic lymphocytic leukemia) (HCC)    Colon polyps    Coronary artery disease    Eczema    GERD (gastroesophageal reflux disease)    Gout    Hearing loss    Hx of cardiovascular stress test    Lexiscan  Myoview  5/16:  The study is normal. No ischemia identified. This is a low risk study. Overall left ventricular systolic function was normal. LV cavity size is normal. The left ventricular ejection fraction is normal (55-65%). Sensitivity reduced by bowel loop attenuation artifact.   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   Pneumonia    Psoriasis    PVD (peripheral vascular disease) (HCC)    Renal cell cancer (HCC) 2007   Squamous cell carcinoma in situ    Wears glasses    Wears hearing aid    both ears  Past Surgical History:  Procedure Laterality Date   CARDIAC CATHETERIZATION     CHOLECYSTECTOMY  10/26/2011   COLONOSCOPY     CORONARY STENT PLACEMENT  2006   CYSTOSCOPY W/ RETROGRADES Left 10/02/2023   Procedure: CYSTOSCOPY WITH RETROGRADE PYELOGRAM;  Surgeon: Nieves Cough, MD;  Location: [facility];  Service: Urology;  Laterality: Left;  60 MINS FOR CASE   CYSTOSCOPY WITH BIOPSY N/A 10/02/2023   Procedure: CYSTOSCOPY WITH BLADDER BIOPSY FULGURATION;  Surgeon: Nieves Cough, MD;  Location: [facility];  Service: Urology;  Laterality: N/A;   CYSTOSCOPY WITH BIOPSY Left 10/30/2023   Procedure: CYSTOSCOPY WITH RETROROGRADE;  Surgeon: Nieves Cough,  MD;  Location: [facility];  Service: Urology;  Laterality: Left;   CYSTOSCOPY WITH URETEROSCOPY AND STENT PLACEMENT Left 10/30/2023   Procedure: CYSTOSCOPY WITH  LEFT URETEROSCOPY AND LEFT URETERAL STENT EXCHANGE;  Surgeon: Nieves Cough, MD;  Location[facility];  Service: Urology;  Laterality: Left;  75 MINUTES   CYSTOSCOPY/URETEROSCOPY/HOLMIUM LASER/STENT PLACEMENT Left 10/02/2023   Procedure: CYSTOSCOPY, LEFT DIAGNOSTIC URETEROSCOPY/STENT PLACEMENT;  Surgeon: Nieves Cough, MD;  Location: [facility];  Service: Urology;  Laterality: Left;   FINGER SURGERY Right 78 y/o   cut index rt   INTERCOSTAL NERVE BLOCK  06/16/2019   Procedure: Intercostal Nerve Block;  Surgeon: Army Dallas NOVAK, MD;  Location: [facility];  Service: Thoracic;;   LIPOMA EXCISION Left 11/18/2013   Procedure: EXCISION POSTERIOR NECK LIPOMA ;  Surgeon: Jina Nephew, MD;  Location: [facility];  Service: General;  Laterality: Left;   LIVER BIOPSY  09/18/2011   LIVER LOBECTOMY  10/26/2011   LYMPH NODE DISSECTION  06/16/2019   Procedure: Lymph Node Dissection;  Surgeon: Army Dallas NOVAK, MD;  Location: [facility];  Service: Thoracic;;   NEPHRECTOMY Right 2008   right   TONSILLECTOMY     VIDEO ASSISTED THORACOSCOPY (VATS)/WEDGE RESECTION Left 06/16/2019   Procedure: VIDEO ASSISTED THORACOSCOPY (VATS)/MINI THOROCOTOMY WITH LEFT UPPER LOBE WEDGE RESECTION;  Surgeon: Army Dallas NOVAK, MD;  Location: [facility];  Service: Thoracic;  Laterality: Left;   VIDEO BRONCHOSCOPY N/A 06/16/2019   Procedure: VIDEO BRONCHOSCOPY;  Surgeon: Army Dallas NOVAK, MD;  Location[facility];  Service: Thoracic;  Laterality: N/A;    Social History   Socioeconomic History   Marital status: Married    Spouse name: Not on file   Number of children: 0   Years of education: Not on file   Highest education level: Not on file  Occupational History   Occupation: Retired from state of [place]  Tobacco Use   Smoking  status: Former    Current packs/day: 0.00    Average packs/day: 1 pack/day for 15.0 years (15.0 ttl pk-yrs)    Types: Cigarettes    Start date: 10/25/1968    Quit date: 10/26/1983    Years since quitting: 40.6   Smokeless tobacco: Never  Vaping Use   Vaping status: Never Used  Substance and Sexual Activity   Alcohol  use: Not Currently    Alcohol /week: 14.0 - 20.0 standard drinks of alcohol     Types: 14 - 20 Glasses of wine per week   Drug use: No   Sexual activity: Not Currently  Other Topics Concern   Not on file  Social History Narrative   Not on file   Social Drivers of Health   Financial Resource Strain: Not on file  Food Insecurity: Not on file  Transportation Needs: Not on file  Physical Activity: Not on file  Stress: Not on file  Social Connections: Not on file     FAMILY HISTORY:  We obtained a detailed, 4-generation family history.  Significant diagnoses are listed below: Family History  Problem Relation Age of Onset   Hypertension Mother    Heart failure Mother    Heart disease Mother    Aneurysm Mother        brain   Peripheral vascular disease Father    Lung cancer Father 59       smoking hx   Hypertension Sister    Breast cancer Sister 96 - 6   Myelodysplastic syndrome Sister 76   Breast cancer Maternal Aunt 60 - 69   Heart attack Maternal Uncle    Heart attack Maternal Grandfather    Colon cancer Neg Hx     Mr. Suto is unaware of previous family history of genetic testing for hereditary cancer risks. There is no reported Ashkenazi Jewish ancestry.    GENETIC COUNSELING ASSESSMENT: Mr. Turnley is a 78 y.o. male with a personal and family history of cancer which is somewhat suggestive of a hereditary predisposition to cancer given his history of five separate cancer diagnoses. We, therefore, discussed and recommended the following at today's visit.   DISCUSSION: We discussed that 5 - 10% of cancer is hereditary. We discussed that testing is  beneficial for several reasons including knowing how to follow individuals after completing their treatment, identifying whether preventative treatment options, and understanding if other family members could be at risk for cancer and allowing them to undergo genetic testing.   We reviewed the characteristics, features and inheritance patterns of hereditary cancer syndromes. We also discussed genetic testing, including the appropriate family members to test, the process of testing, insurance coverage and turn-around-time for results. We discussed the implications of a negative, positive, carrier and/or variant of uncertain significant result.   Reviewed that with a diagnosis of CLL/SLL, it is recommended by the lab that DNA for germline testing would need to be obtained with skin punch biopsy with fibroblast culturing. Reviewed process for skin punch biopsy. If the family is interested in moving forward with genetic testing, we can explore coverage for genetic testing and fibroblast culture with Medicare. Culturing takes approximately 3-4 weeks, genetic testing would take an additional 2-3 weeks.   Mr. Seda personal history of five cancer diagnoses increases suspicion for hereditary cancer. However some of the cancer types described, such as lung, bladder, and melanoma skin cancer, and CLL are more likely to fall into the sporadic or multifactorial inheritance category. It is unclear if he would meet [facility] Medicare criteria for genetic testing. If interested in testing, could request that [facility] confirm he meet criteria with insurance prior to ordering testing.   PLAN:  Mr. Pezzullo did not wish to pursue genetic testing at today's visit. We understand this decision and remain available to coordinate genetic testing at any time in the future. We, therefore, recommend Mr. Siegman continue to follow the cancer screening guidelines given by his primary healthcare provider.  Lastly, we encouraged Mr. Wetherington to  remain in contact with cancer genetics annually so that we can continuously update the family history and inform him of any changes in cancer genetics and testing that may be of benefit for this family.   Mr. Hosking questions were answered to his satisfaction today. Our contact information was provided should additional questions or concerns arise. Thank you for the referral and allowing us  to share in the care of your patient.  Burnard Ogren, MS, [facility] Licensed, Retail [email or web address] phone: [phone]   40 minutes were spent on the date of the encounter in service to the patient including preparation, face-to-face consultation, documentation and care coordination. The patient brought his spouse, Lillian. Drs. Gudena and/or Lanny were available to discuss this case as needed.   _______________________________________________________________________ For Office Staff:  Number of people involved in session: 2 Was an Intern/ student involved with case: no

## 2024-06-18 DIAGNOSIS — C672 Malignant neoplasm of lateral wall of bladder: Secondary | ICD-10-CM | POA: Diagnosis not present

## 2024-07-16 ENCOUNTER — Other Ambulatory Visit: Payer: Self-pay | Admitting: Internal Medicine

## 2024-07-16 ENCOUNTER — Encounter: Payer: Self-pay | Admitting: Internal Medicine

## 2024-07-16 DIAGNOSIS — C672 Malignant neoplasm of lateral wall of bladder: Secondary | ICD-10-CM | POA: Diagnosis not present

## 2024-07-16 DIAGNOSIS — C649 Malignant neoplasm of unspecified kidney, except renal pelvis: Secondary | ICD-10-CM

## 2024-08-20 ENCOUNTER — Other Ambulatory Visit: Payer: Self-pay | Admitting: Internal Medicine

## 2024-08-20 ENCOUNTER — Inpatient Hospital Stay: Attending: Internal Medicine

## 2024-08-20 ENCOUNTER — Ambulatory Visit (HOSPITAL_COMMUNITY)
Admission: RE | Admit: 2024-08-20 | Discharge: 2024-08-20 | Disposition: A | Discharge status: Home / Self Care | Source: Ambulatory Visit | Attending: Internal Medicine | Admitting: Internal Medicine

## 2024-08-20 ENCOUNTER — Ambulatory Visit (HOSPITAL_COMMUNITY)

## 2024-08-20 DIAGNOSIS — J439 Emphysema, unspecified: Secondary | ICD-10-CM | POA: Diagnosis not present

## 2024-08-20 DIAGNOSIS — C9110 Chronic lymphocytic leukemia of B-cell type not having achieved remission: Secondary | ICD-10-CM | POA: Diagnosis not present

## 2024-08-20 DIAGNOSIS — Z79899 Other long term (current) drug therapy: Secondary | ICD-10-CM | POA: Insufficient documentation

## 2024-08-20 DIAGNOSIS — J8410 Pulmonary fibrosis, unspecified: Secondary | ICD-10-CM | POA: Insufficient documentation

## 2024-08-20 DIAGNOSIS — I700 Atherosclerosis of aorta: Secondary | ICD-10-CM | POA: Diagnosis not present

## 2024-08-20 DIAGNOSIS — C3490 Malignant neoplasm of unspecified part of unspecified bronchus or lung: Secondary | ICD-10-CM | POA: Diagnosis not present

## 2024-08-20 DIAGNOSIS — C3412 Malignant neoplasm of upper lobe, left bronchus or lung: Secondary | ICD-10-CM | POA: Diagnosis not present

## 2024-08-20 DIAGNOSIS — C641 Malignant neoplasm of right kidney, except renal pelvis: Secondary | ICD-10-CM | POA: Diagnosis not present

## 2024-08-20 DIAGNOSIS — C649 Malignant neoplasm of unspecified kidney, except renal pelvis: Secondary | ICD-10-CM | POA: Insufficient documentation

## 2024-08-20 DIAGNOSIS — C61 Malignant neoplasm of prostate: Secondary | ICD-10-CM | POA: Diagnosis not present

## 2024-08-20 DIAGNOSIS — R161 Splenomegaly, not elsewhere classified: Secondary | ICD-10-CM | POA: Diagnosis not present

## 2024-08-20 DIAGNOSIS — N400 Enlarged prostate without lower urinary tract symptoms: Secondary | ICD-10-CM | POA: Insufficient documentation

## 2024-08-20 DIAGNOSIS — I2510 Atherosclerotic heart disease of native coronary artery without angina pectoris: Secondary | ICD-10-CM | POA: Diagnosis not present

## 2024-08-20 DIAGNOSIS — R591 Generalized enlarged lymph nodes: Secondary | ICD-10-CM | POA: Diagnosis not present

## 2024-08-20 DIAGNOSIS — R590 Localized enlarged lymph nodes: Secondary | ICD-10-CM | POA: Insufficient documentation

## 2024-08-20 LAB — CBC WITH DIFFERENTIAL (CANCER CENTER ONLY)
Abs Immature Granulocytes: 0.01 K/uL (ref 0.00–0.07)
Basophils Absolute: 0 K/uL (ref 0.0–0.1)
Basophils Relative: 0 %
Eosinophils Absolute: 0.1 K/uL (ref 0.0–0.5)
Eosinophils Relative: 1 %
HCT: 33.3 % — ABNORMAL LOW (ref 39.0–52.0)
Hemoglobin: 10.4 g/dL — ABNORMAL LOW (ref 13.0–17.0)
Immature Granulocytes: 0 %
Lymphocytes Relative: 53 %
Lymphs Abs: 3.6 K/uL (ref 0.7–4.0)
MCH: 25.2 pg — ABNORMAL LOW (ref 26.0–34.0)
MCHC: 31.2 g/dL (ref 30.0–36.0)
MCV: 80.6 fL (ref 80.0–100.0)
Monocytes Absolute: 0.3 K/uL (ref 0.1–1.0)
Monocytes Relative: 4 %
Neutro Abs: 2.9 K/uL (ref 1.7–7.7)
Neutrophils Relative %: 42 %
Platelet Count: 118 K/uL — ABNORMAL LOW (ref 150–400)
RBC: 4.13 MIL/uL — ABNORMAL LOW (ref 4.22–5.81)
RDW: 14.8 % (ref 11.5–15.5)
WBC Count: 6.8 K/uL (ref 4.0–10.5)
nRBC: 0 % (ref 0.0–0.2)

## 2024-08-20 LAB — CMP (CANCER CENTER ONLY)
ALT: 16 U/L (ref 0–44)
AST: 20 U/L (ref 15–41)
Albumin: 3.7 g/dL (ref 3.5–5.0)
Alkaline Phosphatase: 134 U/L — ABNORMAL HIGH (ref 38–126)
Anion gap: 5 (ref 5–15)
BUN: 36 mg/dL — ABNORMAL HIGH (ref 8–23)
CO2: 26 mmol/L (ref 22–32)
Calcium: 9.8 mg/dL (ref 8.9–10.3)
Chloride: 109 mmol/L (ref 98–111)
Creatinine: 1.7 mg/dL — ABNORMAL HIGH (ref 0.61–1.24)
GFR, Estimated: 41 mL/min — ABNORMAL LOW (ref >60)
Glucose, Bld: 111 mg/dL — ABNORMAL HIGH (ref 70–99)
Potassium: 4.1 mmol/L (ref 3.5–5.1)
Sodium: 140 mmol/L (ref 135–145)
Total Bilirubin: 0.5 mg/dL (ref 0.0–1.2)
Total Protein: 6.3 g/dL — ABNORMAL LOW (ref 6.5–8.1)

## 2024-08-20 MED ORDER — IOHEXOL 300 MG/ML  SOLN
100.0000 mL | Freq: Once | INTRAMUSCULAR | Status: DC | PRN
Start: 2024-08-20 — End: 2024-08-20

## 2024-08-20 MED ORDER — IOHEXOL 300 MG/ML  SOLN
80.0000 mL | Freq: Once | INTRAMUSCULAR | Status: AC | PRN
  Administered 2024-08-20: 80 mL via INTRAVENOUS

## 2024-08-29 ENCOUNTER — Other Ambulatory Visit: Payer: Self-pay | Admitting: Radiology

## 2024-08-29 DIAGNOSIS — J700 Acute pulmonary manifestations due to radiation: Secondary | ICD-10-CM

## 2024-08-29 DIAGNOSIS — C3412 Malignant neoplasm of upper lobe, left bronchus or lung: Secondary | ICD-10-CM

## 2024-08-29 MED ORDER — PREDNISONE 20 MG PO TABS: ORAL_TABLET | ORAL | 0 refills | Status: AC

## 2024-08-29 NOTE — Progress Notes (Addendum)
 Patient called today complaining of a 10 day history of a constant, dull left-sided chest pain under his rib cage that has now started to radiate to the right side of his chest. He denies any chest tightness, pressure, or worsening pain. He has taken Tylenol  at night which has been helping. He also notes an approximate 3-4 week history of a cough that is occasionally productive with white phlegm. He denies any hemoptysis. He notes shortness of breath, and is unable to determine if it is worse from his baseline. He denies any fevers or chills.   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 No acute osseous findings were appreciated.   The treatment field was located distal to the chest wall, so it is not common for the patient to develop pain as a result of this treatment. No osseous abnormalities were appreciated on recent imaging. Chest discomfort could be secondary to patient's cough. Prescription for prednisone  sent to patient's preferred pharmacy to treat radiation pneumonitis. This will likely help the patient's chest discomfort as well. Recommend continue use of Tylenol , as needed.   Patient will call us  if symptoms worsen or do not improve. We reviewed signs/symptoms concerning for a heart attack. He understands to present to the ED immediately if he develops any of these.  He will continue to follow closely with Dr. Sherrod. He is scheduled to see him next on 09/02/2024.      Leeroy Due, PA-C

## 2024-09-02 ENCOUNTER — Inpatient Hospital Stay: Attending: Internal Medicine | Admitting: Internal Medicine

## 2024-09-02 VITALS — BP 142/58 | HR 86 | Temp 97.2°F | Resp 17 | Wt 160.5 lb

## 2024-09-02 DIAGNOSIS — C679 Malignant neoplasm of bladder, unspecified: Secondary | ICD-10-CM | POA: Diagnosis not present

## 2024-09-02 DIAGNOSIS — N189 Chronic kidney disease, unspecified: Secondary | ICD-10-CM | POA: Diagnosis not present

## 2024-09-02 DIAGNOSIS — Z8701 Personal history of pneumonia (recurrent): Secondary | ICD-10-CM | POA: Diagnosis not present

## 2024-09-02 DIAGNOSIS — R079 Chest pain, unspecified: Secondary | ICD-10-CM | POA: Diagnosis not present

## 2024-09-02 DIAGNOSIS — C9110 Chronic lymphocytic leukemia of B-cell type not having achieved remission: Secondary | ICD-10-CM | POA: Insufficient documentation

## 2024-09-02 DIAGNOSIS — C3412 Malignant neoplasm of upper lobe, left bronchus or lung: Secondary | ICD-10-CM | POA: Diagnosis not present

## 2024-09-02 DIAGNOSIS — I700 Atherosclerosis of aorta: Secondary | ICD-10-CM | POA: Insufficient documentation

## 2024-09-02 DIAGNOSIS — I2510 Atherosclerotic heart disease of native coronary artery without angina pectoris: Secondary | ICD-10-CM | POA: Insufficient documentation

## 2024-09-02 DIAGNOSIS — Z7982 Long term (current) use of aspirin: Secondary | ICD-10-CM | POA: Diagnosis not present

## 2024-09-02 DIAGNOSIS — J8410 Pulmonary fibrosis, unspecified: Secondary | ICD-10-CM | POA: Insufficient documentation

## 2024-09-02 DIAGNOSIS — Z860100 Personal history of colon polyps, unspecified: Secondary | ICD-10-CM | POA: Diagnosis not present

## 2024-09-02 DIAGNOSIS — Y842 Radiological procedure and radiotherapy as the cause of abnormal reaction of the patient, or of later complication, without mention of misadventure at the time of the procedure: Secondary | ICD-10-CM | POA: Diagnosis not present

## 2024-09-02 DIAGNOSIS — Z86006 Personal history of melanoma in-situ: Secondary | ICD-10-CM | POA: Insufficient documentation

## 2024-09-02 DIAGNOSIS — I1310 Hypertensive heart and chronic kidney disease without heart failure, with stage 1 through stage 4 chronic kidney disease, or unspecified chronic kidney disease: Secondary | ICD-10-CM | POA: Insufficient documentation

## 2024-09-02 DIAGNOSIS — Z79899 Other long term (current) drug therapy: Secondary | ICD-10-CM | POA: Insufficient documentation

## 2024-09-02 DIAGNOSIS — C3490 Malignant neoplasm of unspecified part of unspecified bronchus or lung: Secondary | ICD-10-CM | POA: Diagnosis not present

## 2024-09-02 DIAGNOSIS — Z7952 Long term (current) use of systemic steroids: Secondary | ICD-10-CM | POA: Insufficient documentation

## 2024-09-02 DIAGNOSIS — Z85528 Personal history of other malignant neoplasm of kidney: Secondary | ICD-10-CM | POA: Insufficient documentation

## 2024-09-02 DIAGNOSIS — Z885 Allergy status to narcotic agent status: Secondary | ICD-10-CM | POA: Insufficient documentation

## 2024-09-02 DIAGNOSIS — J700 Acute pulmonary manifestations due to radiation: Secondary | ICD-10-CM | POA: Diagnosis not present

## 2024-09-02 DIAGNOSIS — D649 Anemia, unspecified: Secondary | ICD-10-CM | POA: Diagnosis not present

## 2024-09-02 DIAGNOSIS — N400 Enlarged prostate without lower urinary tract symptoms: Secondary | ICD-10-CM | POA: Diagnosis not present

## 2024-09-02 DIAGNOSIS — Z923 Personal history of irradiation: Secondary | ICD-10-CM | POA: Insufficient documentation

## 2024-09-02 DIAGNOSIS — D6959 Other secondary thrombocytopenia: Secondary | ICD-10-CM | POA: Diagnosis not present

## 2024-09-02 DIAGNOSIS — Z79890 Hormone replacement therapy (postmenopausal): Secondary | ICD-10-CM | POA: Insufficient documentation

## 2024-09-02 DIAGNOSIS — J432 Centrilobular emphysema: Secondary | ICD-10-CM | POA: Insufficient documentation

## 2024-09-02 DIAGNOSIS — Z882 Allergy status to sulfonamides status: Secondary | ICD-10-CM | POA: Insufficient documentation

## 2024-09-02 DIAGNOSIS — Z9049 Acquired absence of other specified parts of digestive tract: Secondary | ICD-10-CM | POA: Insufficient documentation

## 2024-09-02 DIAGNOSIS — Z905 Acquired absence of kidney: Secondary | ICD-10-CM | POA: Insufficient documentation

## 2024-09-02 NOTE — Progress Notes (Signed)
 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  Status post 6 weeks of BCG treatment. Diagnosed in November 2024.    PRIOR THERAPY:  1) Status post left upper lobe wedge resection with lymph node sampling.  2) Status post 6 weeks of intravesical BCG treatment. Diagnosed in November 2024. Completed in January 2025 3) SBRT to recurrent stage Ia non-small cell lung cancer, adenocarcinoma of the left upper lobe under the care of Dr. Dewey completed May 16, 2024   CURRENT THERAPY:    INTERVAL HISTORY: Bradley Irwin 78 y.o. male returns to the clinic today for follow-up visit accompanied by his wife.Discussed the use of AI scribe software for clinical note transcription with the patient, who gave verbal consent to proceed.  History of Present Illness Bradley Irwin is a 78 year old male with recurrent non-small cell lung cancer who presents for evaluation with repeat CT scan for restaging of his disease. He is accompanied by his wife.  He has a history of recurrent non-small cell lung cancer, adenocarcinoma, initially diagnosed in May 2025. He underwent stereotactic body radiation therapy (SBRT) and is here for a repeat CT scan of the chest, abdomen, and pelvis to restage his disease.  Since his last visit, he has experienced chest pain that started on the left side and radiated to the other side. He contacted his healthcare provider and was prescribed  prednisone  for radiation pneumonitis, which alleviated the pain. No significant breathing difficulties, but he mentions a mild cough.  He has a history of stage one non-small cell lung cancer, diagnosed in June 2025, for which he underwent a left upper lobe wedge resection. Additionally, he has a history of bladder cancer in situ, treated with intravesical BCG, and small lymphocytic lymphoma diagnosed in 2021, currently under observation.  Regarding his current medications, he started prednisone  on Saturday, beginning with 50 mg and tapering down as per the prescription instructions. His recent blood test results showed mild anemia and low platelets. His serum creatinine remains at 1.7, and his blood sugar was 111. Alkaline phosphatase levels are also elevated.     MEDICAL HISTORY: Past Medical History:  Diagnosis Date   Anxiety    Atypical mole 06/08/2010   Right Abdomen(moderate) terril)   Atypical mole 04/25/1995   Left Upper ShoulderBlade(slight)   Atypical mole 12/31/2013   Left Mid Back(Moderate) terril)   Bladder cancer (HCC)    CLL (chronic lymphocytic leukemia) (HCC)    Colon polyps    Coronary artery disease    Eczema    GERD (gastroesophageal reflux disease)    Gout    Hearing loss    Hx of cardiovascular stress test    Lexiscan  Myoview  5/16:  The study is normal. No ischemia identified. This is a low risk study. Overall left ventricular systolic function was normal. LV cavity size is normal. The left ventricular ejection fraction is normal (55-65%). Sensitivity reduced by bowel loop  attenuation artifact.   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  MEDICATIONS:  Current Outpatient Medications  Medication Sig Dispense Refill   acetaminophen  (TYLENOL ) 500 MG tablet Take 500 mg by mouth at bedtime.     alfuzosin (UROXATRAL) 10 MG 24 hr tablet Take 10 mg by mouth daily.     allopurinol (ZYLOPRIM) 100 MG tablet Take 100 mg by mouth daily.     ALPRAZolam  (XANAX ) 0.5 MG tablet Take 0.5 mg by mouth 2 (two) times daily as needed for anxiety.      amitriptyline (ELAVIL) 10 MG tablet Take 10 mg by mouth at bedtime as needed.     aspirin  EC 81 MG tablet Take 81 mg by mouth daily. Swallow whole.     clidinium-chlordiazePOXIDE  (LIBRAX ) 5-2.5 MG capsule Take 1 capsule by mouth daily as needed (ibs).     Colchicine 0.6 MG CAPS Take 1 tablet by mouth as needed (gout).     Cranberry-Vitamin C-Probiotic (AZO CRANBERRY PO) Take 2 tablets by mouth daily.     diclofenac Sodium (VOLTAREN) 1 % GEL Apply 1 Application topically 4 (four) times daily as needed (pain).     dicyclomine (BENTYL) 20 MG tablet Take 20 mg by mouth daily as needed for spasms.     Docosanol (ABREVA) 10 % CREA Apply 1 Application topically daily as needed (fever blisters).     Docusate Sodium  (DSS) 100 MG CAPS Take 100 mg by mouth 2 (two) times daily as needed (constipation).     ferrous sulfate 325 (65 FE) MG tablet Take 325 mg by mouth daily.     fluticasone (FLONASE) 50 MCG/ACT nasal spray Place 1 spray into both nostrils daily.     hydrOXYzine (ATARAX) 25 MG tablet Take 25 mg by mouth as needed for itching.     levothyroxine  (SYNTHROID ) 137 MCG tablet Take 137 mcg by mouth every morning.     Methenamine-Sodium Salicylate (AZO URINARY TRACT DEFENSE) 162-162.5 MG TABS Take 1 tablet by mouth as needed (urinary pain).     nitroGLYCERIN  (NITROSTAT ) 0.4 MG SL tablet Place 0.4 mg under the tongue every 5 (five) minutes as needed for chest pain.     omeprazole (PRILOSEC) 20 MG capsule Take 20 mg by  mouth 2 (two) times daily.      oxybutynin  (DITROPAN ) 5 MG tablet Take 1 tablet (5 mg total) by mouth 2 (two) times daily as needed for bladder spasms. 15 tablet 1   polyethylene glycol (MIRALAX / GLYCOLAX) 17 g packet Take 17 g by mouth daily.     predniSONE  (DELTASONE ) 20 MG tablet Take 2.5 tablets (50 mg total) by mouth daily with breakfast for 28 days, THEN 2 tablets (40 mg total) daily with breakfast for 7 days, THEN 1.5 tablets (30 mg total) daily with breakfast for 7 days, THEN 1 tablet (20 mg total) daily with breakfast for 7 days, THEN 0.5 tablets (10 mg total) daily with breakfast for 7 days. 105 tablet 0  Probiotic Product (PROBIOTIC PO) Take 1 capsule by mouth daily.     rosuvastatin (CRESTOR) 10 MG tablet Take 10 mg by mouth daily.     simethicone  (MYLICON) 125 MG chewable tablet Chew 125 mg by mouth every 6 (six) hours as needed for flatulence.     tadalafil (CIALIS) 5 MG tablet      Vibegron (GEMTESA) 75 MG TABS Take 1 tablet by mouth daily.     No current facility-administered medications for this visit.    SURGICAL HISTORY:  Past Surgical History:  Procedure Laterality Date   CARDIAC CATHETERIZATION     CHOLECYSTECTOMY  10/26/2011   COLONOSCOPY     CORONARY STENT PLACEMENT  2006   CYSTOSCOPY W/ RETROGRADES Left 10/02/2023   Procedure: CYSTOSCOPY WITH RETROGRADE PYELOGRAM;  Surgeon: Nieves Cough, MD;  Location: [facility];  Service: Urology;  Laterality: Left;  60 MINS FOR CASE   CYSTOSCOPY WITH BIOPSY N/A 10/02/2023   Procedure: CYSTOSCOPY WITH BLADDER BIOPSY FULGURATION;  Surgeon: Nieves Cough, MD;  Location: [facility];  Service: Urology;  Laterality: N/A;   CYSTOSCOPY WITH BIOPSY Left 10/30/2023   Procedure: CYSTOSCOPY WITH RETROROGRADE;  Surgeon: Nieves Cough, MD;  Location: [facility];  Service: Urology;  Laterality: Left;   CYSTOSCOPY WITH URETEROSCOPY AND STENT PLACEMENT Left 10/30/2023   Procedure: CYSTOSCOPY WITH  LEFT URETEROSCOPY AND LEFT  URETERAL STENT EXCHANGE;  Surgeon: Nieves Cough, MD;  Location: [facility];  Service: Urology;  Laterality: Left;  75 MINUTES   CYSTOSCOPY/URETEROSCOPY/HOLMIUM LASER/STENT PLACEMENT Left 10/02/2023   Procedure: CYSTOSCOPY, LEFT DIAGNOSTIC URETEROSCOPY/STENT PLACEMENT;  Surgeon: Nieves Cough, MD;  Location: [facility];  Service: Urology;  Laterality: Left;   FINGER SURGERY Right 78 y/o   cut index rt   INTERCOSTAL NERVE BLOCK  06/16/2019   Procedure: Intercostal Nerve Block;  Surgeon: Army Dallas NOVAK, MD;  Location[facility];  Service: Thoracic;;   LIPOMA EXCISION Left 11/18/2013   Procedure: EXCISION POSTERIOR NECK LIPOMA ;  Surgeon: Jina Nephew, MD;  Location: [facility];  Service: General;  Laterality: Left;   LIVER BIOPSY  09/18/2011   LIVER LOBECTOMY  10/26/2011   LYMPH NODE DISSECTION  06/16/2019   Procedure: Lymph Node Dissection;  Surgeon: Army Dallas NOVAK, MD;  Location: [facility] OR;  Service: Thoracic;;   NEPHRECTOMY Right 2008   right   TONSILLECTOMY     VIDEO ASSISTED THORACOSCOPY (VATS)/WEDGE RESECTION Left 06/16/2019   Procedure: VIDEO ASSISTED THORACOSCOPY (VATS)/MINI THOROCOTOMY WITH LEFT UPPER LOBE WEDGE RESECTION;  Surgeon: Army Dallas NOVAK, MD;  Location: [facility];  Service: Thoracic;  Laterality: Left;   VIDEO BRONCHOSCOPY N/A 06/16/2019   Procedure: VIDEO BRONCHOSCOPY;  Surgeon: Army Dallas NOVAK, MD;  Location: [facility] OR;  Service: Thoracic;  Laterality: N/A;    REVIEW OF SYSTEMS:  Constitutional: positive for fatigue Eyes: negative Ears, nose, mouth, throat, and face: negative Respiratory: positive for cough and dyspnea on exertion Cardiovascular: negative Gastrointestinal: negative Genitourinary:negative Integument/breast: negative Hematologic/lymphatic: negative Musculoskeletal:negative Neurological: negative Behavioral/Psych: negative Endocrine: negative Allergic/Immunologic: negative   PHYSICAL EXAMINATION: General  appearance: alert, cooperative, fatigued, and no distress Head: Normocephalic, without obvious abnormality, atraumatic Neck: no adenopathy, no JVD, supple, symmetrical, trachea midline, and thyroid not enlarged, symmetric, no tenderness/mass/nodules Lymph nodes: Cervical, supraclavicular, and axillary nodes normal. Resp: clear to auscultation bilaterally Back: symmetric, no curvature. ROM normal. No CVA tenderness. Cardio: regular rate and rhythm, S1, S2 normal, no murmur, click, rub or gallop GI: soft, non-tender; bowel sounds normal; no masses,  no organomegaly Extremities: extremities normal, atraumatic, no cyanosis or edema Neurologic: Alert and oriented X 3, normal strength and tone. Normal symmetric reflexes. Normal coordination and gait  ECOG PERFORMANCE STATUS: 1 - Symptomatic but completely ambulatory  Blood pressure (!) 142/58, pulse 86, temperature (!) 97.2 F (36.2 C), resp. rate 17, weight 160 lb 8 oz (72.8 kg), SpO2 100%.  LABORATORY DATA: Lab Results  Component Value Date   WBC 6.8 08/20/2024   HGB 10.4 (L) 08/20/2024   HCT 33.3 (L) 08/20/2024   MCV 80.6 08/20/2024   PLT 118 (L) 08/20/2024      Chemistry      Component Value Date/Time   NA 140 08/20/2024 0954   K 4.1 08/20/2024 0954   CL 109 08/20/2024 0954   CO2 26 08/20/2024 0954   BUN 36 (H) 08/20/2024 0954   CREATININE 1.70 (H) 08/20/2024 0954   CREATININE 1.30 (H) 02/06/2016 1714      Component Value Date/Time   CALCIUM  9.8 08/20/2024 0954   ALKPHOS 134 (H) 08/20/2024 0954   AST 20 08/20/2024 0954   ALT 16 08/20/2024 0954   BILITOT 0.5 08/20/2024 0954       RADIOGRAPHIC STUDIES: CT CHEST ABDOMEN PELVIS W CONTRAST Result Date: 08/28/2024 CLINICAL DATA:  Prostate cancer staging, additional history of recurrent lung cancer and lymphoma * Tracking Code: BO * EXAM: CT CHEST, ABDOMEN, AND PELVIS WITH CONTRAST TECHNIQUE: Multidetector CT imaging of the chest, abdomen and pelvis was performed following the  standard protocol during bolus administration of intravenous contrast. RADIATION DOSE REDUCTION: This exam was performed according to the departmental dose-optimization program which includes automated exposure control, adjustment of the mA and/or kV according to patient size and/or use of iterative reconstruction technique. CONTRAST:  80mL OMNIPAQUE  IOHEXOL  300 MG/ML  SOLN COMPARISON:  PET-CT, 03/14/2024 FINDINGS: CT CHEST FINDINGS Cardiovascular: No significant vascular findings. Cardiomegaly. Three-vessel coronary artery calcifications. No pericardial effusion. Mediastinum/Nodes: No significant change enlarged bilateral axillary and subpectoral lymph nodes, index node in the left axilla measuring 3.0 x 2.6 cm (series 2, image 21). Thyroid gland, trachea, and esophagus demonstrate no significant findings. Lungs/Pleura: Status post wedge resection of the posterior left upper lobe. Similar appearance of nodular soft tissue about the suprahilar resection margin measuring 1.7 x 1.5 cm (series 7, image 62). There is however very extensive new bandlike heterogeneous and consolidative airspace opacity in the adjacent posterior left upper lobe and superior segment left lower lobe. Unchanged elongated subsolid opacity of the medial right upper lobe measuring 1.7 x 0.8 cm in cross-section (series 7, image 37). Mild underlying centrilobular emphysema. No pleural effusion or pneumothorax. Musculoskeletal: No chest wall abnormality. No acute osseous findings. CT ABDOMEN PELVIS FINDINGS Hepatobiliary: Evidence of prior right lobe liver resection (series 2, image 65). Status post cholecystectomy. No biliary dilatation. Pancreas: Unremarkable. No pancreatic ductal dilatation or surrounding inflammatory changes. Spleen: Mild splenomegaly, maximum span 13.5 cm. Adrenals/Urinary Tract: Adrenal glands are unremarkable. Kidneys are normal, without renal calculi, solid lesion, or hydronephrosis. Bladder is unremarkable. Stomach/Bowel:  Stomach is within normal limits. Appendix appears normal. No evidence of bowel wall thickening, distention, or inflammatory changes. Vascular/Lymphatic: Aortic atherosclerosis. Unchanged bilateral iliac lymphadenopathy, particularly bulky bilateral pelvic sidewall lymph nodes measuring up 2 4.1 x 1.7 cm on the right (series 2, image 108). Reproductive: Mild prostatomegaly. Other: No abdominal wall hernia or abnormality. No ascites. Musculoskeletal: No acute osseous findings. IMPRESSION: 1. Status post wedge resection of the posterior left upper lobe. Similar appearance of nodular soft tissue about  the suprahilar resection margin measuring 1.7 x 1.5 cm. There is however very extensive new bandlike heterogeneous and consolidative airspace opacity in the adjacent posterior left upper lobe and superior segment left lower lobe. Findings are consistent with developing radiation pneumonitis and fibrosis. 2. Unchanged elongated subsolid opacity of the medial right upper lobe measuring 1.7 x 0.8 cm in cross-section. Attention on follow-up. 3. Unchanged bilateral axillary, subpectoral, and pelvic lymphadenopathy, consistent with lymphoma. 4. Mild prostatomegaly. Prostate lesion detailed by prior MR is not specifically appreciated. 5. Mild splenomegaly. 6. Emphysema. 7. Coronary artery disease. Aortic Atherosclerosis (ICD10-I70.0) and Emphysema (ICD10-J43.9). Electronically Signed   By: Marolyn JONETTA Jaksch M.D.   On: 08/28/2024 17:02     ASSESSMENT AND PLAN: This is a very pleasant 78 years old white male with stage IA non-small cell lung cancer, adenocarcinoma with positive MET 14 splice mutation status post wedge resection of the left upper lobe with lymph node sampling in June 2020.  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 He also had recurrent stage Ia non-small cell lung cancer, adenocarcinoma involving the right upper lobe status post SBRT in May 2025. The patient is currently  on observation and he is feeling fine with no concerning complaints except for the recent hematuria followed by urology. He had repeat CT scan of the chest, abdomen and pelvis performed recently.  I personally and independently reviewed the scan and discussed the results with the patient and his wife and showed him the images today. Assessment and Plan Assessment & Plan Recurrent non-small cell lung cancer, adenocarcinoma, status post SPRT Recurrent non-small cell lung cancer, adenocarcinoma, diagnosed in May 2025, status post stereotactic body radiation therapy (SPRT). Current imaging shows significant scarring and inflammation in the left lung, complicating assessment for residual cancer. The scarring is expected post-radiation, necessitating close monitoring to rule out cancer recurrence. - Order repeat CT scan of the chest, abdomen, and pelvis in 3 months to monitor for changes in scarring and potential cancer recurrence.  Radiation-induced pneumonitis and pulmonary fibrosis, left lung Radiation-induced pneumonitis and pulmonary fibrosis in the left lung following SPRT. Symptoms of chest pain and mild cough have improved with prednisone  treatment. The scarring and inflammation are consistent with post-radiation changes, and the pneumonitis is resolving. - Continue prednisone  with tapering dose as prescribed. - Monitor symptoms and lung function. - Reassess with CT scan in 3 months.  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  Mild anemia Mild anemia likely related to recent radiation treatment. - Recommend iron supplements.  Thrombocytopenia, post-radiation Thrombocytopenia observed post-radiation, consistent with previous results. - Monitor platelet levels in follow-up blood tests.  Chronic kidney disease, unspecified stage Chronic kidney disease with serum creatinine at 1.7,  consistent with previous results. - Continue monitoring kidney function.  He was advised to call immediately if he has any other concerning symptoms in the interval.  The patient voices understanding of current disease status and treatment options and is in agreement with the current care plan.  All questions were answered. The patient knows to call the clinic with any problems, questions or concerns. We can certainly see the patient much sooner if necessary. The total time spent in the appointment was 30 minutes.  Disclaimer: This note was dictated with voice recognition software. Similar sounding words can inadvertently be transcribed and may not be corrected upon review.

## 2024-09-05 ENCOUNTER — Encounter: Payer: Self-pay | Admitting: Internal Medicine

## 2024-09-29 DIAGNOSIS — N403 Nodular prostate with lower urinary tract symptoms: Secondary | ICD-10-CM | POA: Diagnosis not present

## 2024-10-08 DIAGNOSIS — D400 Neoplasm of uncertain behavior of prostate: Secondary | ICD-10-CM | POA: Diagnosis not present

## 2024-10-08 DIAGNOSIS — Z8551 Personal history of malignant neoplasm of bladder: Secondary | ICD-10-CM | POA: Diagnosis not present

## 2024-10-08 DIAGNOSIS — R399 Unspecified symptoms and signs involving the genitourinary system: Secondary | ICD-10-CM | POA: Diagnosis not present

## 2024-10-21 DIAGNOSIS — Z1389 Encounter for screening for other disorder: Secondary | ICD-10-CM | POA: Diagnosis not present

## 2024-10-21 DIAGNOSIS — Z125 Encounter for screening for malignant neoplasm of prostate: Secondary | ICD-10-CM | POA: Diagnosis not present

## 2024-10-21 DIAGNOSIS — R7303 Prediabetes: Secondary | ICD-10-CM | POA: Diagnosis not present

## 2024-10-21 DIAGNOSIS — E7800 Pure hypercholesterolemia, unspecified: Secondary | ICD-10-CM | POA: Diagnosis not present

## 2024-10-21 DIAGNOSIS — I129 Hypertensive chronic kidney disease with stage 1 through stage 4 chronic kidney disease, or unspecified chronic kidney disease: Secondary | ICD-10-CM | POA: Diagnosis not present

## 2024-10-21 DIAGNOSIS — E039 Hypothyroidism, unspecified: Secondary | ICD-10-CM | POA: Diagnosis not present

## 2024-10-21 DIAGNOSIS — Z0189 Encounter for other specified special examinations: Secondary | ICD-10-CM | POA: Diagnosis not present

## 2024-10-21 DIAGNOSIS — N1831 Chronic kidney disease, stage 3a: Secondary | ICD-10-CM | POA: Diagnosis not present

## 2024-10-28 ENCOUNTER — Ambulatory Visit: Payer: Self-pay | Admitting: Pulmonary Disease

## 2024-10-28 DIAGNOSIS — C3492 Malignant neoplasm of unspecified part of left bronchus or lung: Secondary | ICD-10-CM | POA: Diagnosis not present

## 2024-10-28 DIAGNOSIS — J700 Acute pulmonary manifestations due to radiation: Secondary | ICD-10-CM | POA: Diagnosis not present

## 2024-10-28 DIAGNOSIS — Z0000 Encounter for general adult medical examination without abnormal findings: Secondary | ICD-10-CM | POA: Diagnosis not present

## 2024-10-28 DIAGNOSIS — T380X5A Adverse effect of glucocorticoids and synthetic analogues, initial encounter: Secondary | ICD-10-CM | POA: Diagnosis not present

## 2024-10-28 DIAGNOSIS — J8410 Pulmonary fibrosis, unspecified: Secondary | ICD-10-CM | POA: Diagnosis not present

## 2024-10-28 DIAGNOSIS — C672 Malignant neoplasm of lateral wall of bladder: Secondary | ICD-10-CM | POA: Diagnosis not present

## 2024-10-28 DIAGNOSIS — C8300 Small cell B-cell lymphoma, unspecified site: Secondary | ICD-10-CM | POA: Diagnosis not present

## 2024-10-28 DIAGNOSIS — Z1339 Encounter for screening examination for other mental health and behavioral disorders: Secondary | ICD-10-CM | POA: Diagnosis not present

## 2024-10-28 DIAGNOSIS — Z1331 Encounter for screening for depression: Secondary | ICD-10-CM | POA: Diagnosis not present

## 2024-10-28 DIAGNOSIS — I1310 Hypertensive heart and chronic kidney disease without heart failure, with stage 1 through stage 4 chronic kidney disease, or unspecified chronic kidney disease: Secondary | ICD-10-CM | POA: Diagnosis not present

## 2024-10-28 DIAGNOSIS — E271 Primary adrenocortical insufficiency: Secondary | ICD-10-CM | POA: Diagnosis not present

## 2024-10-28 NOTE — Telephone Encounter (Signed)
 FYI Only or Action Required?: FYI only for provider: appointment scheduled on 10/29/2024.  Patient is followed in Pulmonology for ILD, last seen on 09/26/2023 by Mannam, Praveen, MD.  Called Nurse Triage reporting Shortness of Breath.  Symptoms began several days ago.  Interventions attempted: Other: rest.  Symptoms are: unchanged.  Triage Disposition: See HCP Within 4 Hours (Or PCP Triage)  Patient/caregiver understands and will follow disposition?: Yes  Copied from CRM #[id number]. Topic: Clinical - Red Word Triage >> Oct 28, 2024  3:43 PM Lavanda D wrote: Red Word that prompted transfer to Nurse Triage: SOB/Weakness. Patient has been having constant chest/lung pain and breathing problems so he was put on prednisone . He is not off of it but has been feeling weakness and SOB that has been worsening. Reason for Disposition  [1] MILD difficulty breathing (e.g., minimal/no SOB at rest, SOB with walking, pulse < 100) AND [2] NEW-onset or WORSE than normal  Answer Assessment - Initial Assessment Questions 1. RESPIRATORY STATUS: Describe your breathing? (e.g., wheezing, shortness of breath, unable to speak, severe coughing)      Reports shortness of breath 2. ONSET: When did this breathing problem begin?      2-3 months,  Stopped radiation in May and developed pain in chest, put on prednisone  by oncologist. Came off prednisone  on 10/24/2024, symptoms more noticeable since then 3. PATTERN Does the difficult breathing come and go, or has it been constant since it started?      Increased activity causes more SOB 4. SEVERITY: How bad is your breathing? (e.g., mild, moderate, severe)      mild 6. CARDIAC HISTORY: Do you have any history of heart disease? (e.g., heart attack, angina, bypass surgery, angioplasty)      HTN 7. LUNG HISTORY: Do you have any history of lung disease?  (e.g., pulmonary embolus, asthma, emphysema)     ILD, Lung Cancer 8. CAUSE: What do you think is causing  the breathing problem?      Unsure, recent treatment for lung cancer, recent d/c of prednisone  9. OTHER SYMPTOMS: Do you have any other symptoms? (e.g., chest pain, cough, dizziness, fever, runny nose)     Occasional cough 10. O2 SATURATION MONITOR:  Do you use an oxygen saturation monitor (pulse oximeter) at home? If Yes, ask: What is your reading (oxygen level) today? What is your usual oxygen saturation reading? (e.g., 95%)       Not monitored  Protocols used: Breathing Difficulty-A-AH

## 2024-10-29 ENCOUNTER — Encounter: Payer: Self-pay | Admitting: Pulmonary Disease

## 2024-10-29 ENCOUNTER — Ambulatory Visit: Admitting: Pulmonary Disease

## 2024-10-29 VITALS — BP 126/68 | HR 99 | Temp 98.3°F | Ht 66.0 in | Wt 163.0 lb

## 2024-10-29 DIAGNOSIS — Z87891 Personal history of nicotine dependence: Secondary | ICD-10-CM | POA: Diagnosis not present

## 2024-10-29 DIAGNOSIS — J700 Acute pulmonary manifestations due to radiation: Secondary | ICD-10-CM | POA: Diagnosis not present

## 2024-10-29 DIAGNOSIS — Z902 Acquired absence of lung [part of]: Secondary | ICD-10-CM

## 2024-10-29 DIAGNOSIS — C3490 Malignant neoplasm of unspecified part of unspecified bronchus or lung: Secondary | ICD-10-CM | POA: Diagnosis not present

## 2024-10-29 DIAGNOSIS — Z923 Personal history of irradiation: Secondary | ICD-10-CM | POA: Diagnosis not present

## 2024-10-29 DIAGNOSIS — W888XXS Exposure to other ionizing radiation, sequela: Secondary | ICD-10-CM

## 2024-10-29 DIAGNOSIS — J849 Interstitial pulmonary disease, unspecified: Secondary | ICD-10-CM

## 2024-10-29 MED ORDER — BREZTRI AEROSPHERE 160-9-4.8 MCG/ACT IN AERO: 2.0000 | INHALATION_SPRAY | Freq: Two times a day (BID) | RESPIRATORY_TRACT | Status: DC

## 2024-10-29 MED ORDER — BREZTRI AEROSPHERE 160-9-4.8 MCG/ACT IN AERO: 2.0000 | INHALATION_SPRAY | Freq: Two times a day (BID) | RESPIRATORY_TRACT | 0 refills | Status: AC

## 2024-10-29 NOTE — Progress Notes (Signed)
 Bradley Irwin    [id number]    1946-06-22  Primary Care Physician:[name], Leita DEL, MD  Referring Physician: Stephane Leita DEL, MD [address] [place],  [place] [address]  Chief complaint: Follow-up for interstitial lung disease, radiation pneumonitis, recurrent lung cancer, bladder cancer  HPI: 78 y.o. with history of CLL, lung cancer Diagnosed with stage Ia non-small cell lung cancer, adenocarcinoma s/p wedge resection of the left upper lobe with lymph node sampling in June 2020.  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  Other history significant for kidney cancer status post right nephrectomy in 2008.    Interim history: Discussed the use of AI scribe software for clinical note transcription with the patient, who gave verbal consent to proceed.  History of Present Illness Bradley Irwin is a 78 year old male with recurrent lung cancer who presents with difficulty breathing.   Recurrent cancer - CT scans in March 2025 showed a soft tissue density around his previous staple line with hypermetabolic PET scan - Completed radiation therapy for recurrent lung cancer in May 2025 but developed post radiation pneumonitis - Prednisone  prescribed for respiratory symptoms, starting at 50 mg with a taper, completed on October 31st - Initial improvement in symptoms with prednisone , followed by worsening after discontinuation - Also has a diagnosis of bladder cancer for which he is getting intravesicular BCG  Dyspnea and chest pain - Difficulty breathing, onset seven months after completion of radiation therapy for recurrent lung cancer - Breathing discomfort improves when sitting upright in a recliner - Intermittent chest pain - No persistent cough  Systemic symptoms - Intermittent chills, aches, and pains - No fever - Symptom relief with Tylenol   Gastrointestinal symptoms - History of acid reflux, managed with omeprazole 20 mg twice daily - No significant  issues with indigestion   Relevant pulmonary history Pets: No pets.  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  He has a feather pillow for the past 2 to 3 years Exposures: Possible asbestos and feather pillow exposure as above Smoking history: Quit smoking in 1984.  13-pack-year smoker Travel history: No significant travel Relevant family history: No family history of lung disease  Outpatient Encounter Medications as of 10/29/2024  Medication Sig   acetaminophen  (TYLENOL ) 500 MG tablet Take 500 mg by mouth at bedtime.   alfuzosin (UROXATRAL) 10 MG 24 hr tablet Take 10 mg by mouth daily.   allopurinol (ZYLOPRIM) 100 MG tablet Take 100 mg by mouth daily.   ALPRAZolam  (XANAX ) 0.5 MG tablet Take 0.5 mg by mouth 2 (two) times daily as needed for anxiety.    amitriptyline (ELAVIL) 10 MG tablet Take 10 mg by mouth at bedtime as needed.   aspirin  EC 81 MG tablet Take 81 mg by mouth daily. Swallow whole.   clidinium-chlordiazePOXIDE  (LIBRAX ) 5-2.5 MG capsule Take 1 capsule by mouth daily as needed (ibs).   Colchicine 0.6 MG CAPS Take 1 tablet by mouth as needed (gout).   Cranberry-Vitamin C-Probiotic (AZO CRANBERRY PO) Take 2 tablets by mouth daily.   diclofenac Sodium (VOLTAREN) 1 % GEL Apply 1 Application topically 4 (four) times daily as needed (pain).   dicyclomine (BENTYL) 20 MG tablet Take 20 mg by mouth daily as needed for spasms.   Docosanol (ABREVA) 10 % CREA Apply 1 Application topically daily as needed (fever blisters).  Docusate Sodium  (DSS) 100 MG CAPS Take 100 mg by mouth 2 (two) times daily as needed (constipation).   fluticasone (FLONASE) 50 MCG/ACT nasal spray Place 1 spray into both nostrils daily.   hydrOXYzine (ATARAX) 25 MG tablet Take 25 mg by mouth as needed for itching.   levothyroxine  (SYNTHROID ) 137 MCG tablet Take 137 mcg by mouth every morning.    Methenamine-Sodium Salicylate (AZO URINARY TRACT DEFENSE) 162-162.5 MG TABS Take 1 tablet by mouth as needed (urinary pain).   nitroGLYCERIN  (NITROSTAT ) 0.4 MG SL tablet Place 0.4 mg under the tongue every 5 (five) minutes as needed for chest pain.   omeprazole (PRILOSEC) 20 MG capsule Take 20 mg by mouth 2 (two) times daily.    oxybutynin  (DITROPAN ) 5 MG tablet Take 1 tablet (5 mg total) by mouth 2 (two) times daily as needed for bladder spasms.   polyethylene glycol (MIRALAX / GLYCOLAX) 17 g packet Take 17 g by mouth daily.   Probiotic Product (PROBIOTIC PO) Take 1 capsule by mouth daily.   rosuvastatin (CRESTOR) 10 MG tablet Take 10 mg by mouth daily.   simethicone  (MYLICON) 125 MG chewable tablet Chew 125 mg by mouth every 6 (six) hours as needed for flatulence.   tadalafil (CIALIS) 5 MG tablet    Vibegron (GEMTESA) 75 MG TABS Take 1 tablet by mouth daily.   ferrous sulfate 325 (65 FE) MG tablet Take 325 mg by mouth daily. (Patient not taking: Reported on 10/29/2024)   No facility-administered encounter medications on file as of 10/29/2024.   Physical Exam: Blood pressure 138/64, pulse (!) 101, temperature 97.7 F (36.5 C), temperature source Oral, height 5' 6 (1.676 m), weight 183 lb 3.2 oz (83.1 kg), SpO2 100 %. Gen:      No acute distress HEENT:  EOMI, sclera anicteric Neck:     No masses; no thyromegaly Lungs:    Clear to auscultation bilaterally; normal respiratory effort CV:         Regular rate and rhythm; no murmurs Abd:      + bowel sounds; soft, non-tender; no palpable masses, no distension Ext:    No edema; adequate peripheral perfusion Skin:      Warm and dry; no rash Neuro: alert and oriented x 3 Psych: normal mood and affect  Data Reviewed: Imaging: CT chest 02/03/2022-groundglass attenuation nodule in the right upper lobe.  Worsening lymphadenopathy in bilateral axilla and subpectoral nodal station.  Widespread areas of groundglass attenuation and septal thickening  bilaterally.  Indeterminate pattern.    CT chest 08/02/2022-areas of groundglass attenuation, septal thickening.  CT chest 08/08/2023-nodular thickening along the inferior margin of the left upper lobe, stable groundglass nodule in the right upper lobe, stable lymph nodes, stable interstitial changes at the base  CT chest 02/20/2024-interval enlargement of nodular soft tissue in the left upper lobe staple line  PET scan 03/14/2024-hypermetabolic nodule in the left upper lobe.  Enlarged bilateral axillary lymph node.  Intense metabolic activity in the prostate gland.  CT chest 08/20/2024-new extensive consolidative airspace opacity in the left upper lobe and superior segment of left lower lobe consistent with radiation pneumonitis and fibrosis.  I reviewed the images personally.  PFTs: 05/14/2019 FVC 3.54 [94%], FEV1 2.97 [108%], F/F 84, TLC 5.58 [89%], DLCO 20.25 [89%] Normal test  Labs: CTD serologies 03/13/2022-ANA 1: 40, cytoplasmic speckled Assessment & Plan Radiation pneumonitis, left upper lobe Radiation pneumonitis in the left upper lobe, likely secondary to prior radiation therapy for lung cancer. Symptoms include dyspnea, chills,  and myalgia, exacerbated after cessation of prednisone . CT scan in September showed inflammation and scarring consistent with radiation pneumonitis. Concerns about prednisone  interfering with upcoming BCG treatments for bladder cancer. Discussed potential need to restart prednisone  if symptoms worsen, but will first attempt to manage with an inhaled steroid. - Ordered high-resolution CT scan of the lungs to assess current status of radiation pneumonitis. - Prescribed Breztri inhaler, two puffs in the morning and two puffs in the evening, to manage symptoms and reduce reliance on oral prednisone . - Will reassess symptoms after CT scan results to determine if prednisone  is necessary.  History of lung cancer, status post resection and radiation Lung cancer with  prior resection 2020 and radiation therapy for recurrent cancer 2025. Current symptoms may be related to radiation pneumonitis rather than cancer recurrence. Scheduled for follow-up CT scan in December to evaluate lung status. - Continue with your scheduled CT and pelvis scan in December for cancer surveillance   Plan/Recommendations: High-res CT Breztri inhaler  I personally spent a total of 45 minutes in the care of the patient today including preparing to see the patient, getting/reviewing separately obtained history, performing a medically appropriate exam/evaluation, documenting clinical information in the EHR, independently interpreting results, communicating results, and coordinating care.   Daleiza Bacchi MD [facility] 10/29/2024, 1:00 PM  CC: Stephane Leita DEL, MD

## 2024-10-29 NOTE — Patient Instructions (Addendum)
  VISIT SUMMARY: During your visit, we discussed your breathing difficulties, which may be related to radiation pneumonitis following your lung cancer treatment. We also reviewed your history of lung cancer and smoking.  YOUR PLAN: RADIATION PNEUMONITIS: You have inflammation in your left upper lung likely due to your previous radiation therapy for lung cancer. This is causing your breathing difficulties and other symptoms. -We have ordered a high-resolution CT scan of your lungs to check the current status of the inflammation. -You are prescribed a Breztri inhaler. Please take two puffs in the morning and two puffs in the evening to help manage your symptoms. -We will reassess your symptoms after we get the CT scan results to decide if you need to restart prednisone .  HISTORY OF LUNG CANCER: You have a history of lung cancer that was treated with surgery and radiation. -Continue with your scheduled CT and pelvis scan in December for cancer surveillance

## 2024-11-06 ENCOUNTER — Ambulatory Visit
Admission: RE | Admit: 2024-11-06 | Discharge: 2024-11-06 | Disposition: A | Discharge status: Home / Self Care | Source: Ambulatory Visit | Attending: Pulmonary Disease | Admitting: Pulmonary Disease

## 2024-11-06 DIAGNOSIS — R911 Solitary pulmonary nodule: Secondary | ICD-10-CM | POA: Diagnosis not present

## 2024-11-06 DIAGNOSIS — J849 Interstitial pulmonary disease, unspecified: Secondary | ICD-10-CM

## 2024-11-06 DIAGNOSIS — J432 Centrilobular emphysema: Secondary | ICD-10-CM | POA: Diagnosis not present

## 2024-11-14 ENCOUNTER — Ambulatory Visit: Payer: Self-pay | Admitting: Pulmonary Disease

## 2024-11-17 DIAGNOSIS — N1832 Chronic kidney disease, stage 3b: Secondary | ICD-10-CM | POA: Diagnosis not present

## 2024-11-24 DIAGNOSIS — C3490 Malignant neoplasm of unspecified part of unspecified bronchus or lung: Secondary | ICD-10-CM | POA: Diagnosis not present

## 2024-11-24 DIAGNOSIS — C679 Malignant neoplasm of bladder, unspecified: Secondary | ICD-10-CM | POA: Diagnosis not present

## 2024-11-24 DIAGNOSIS — N1832 Chronic kidney disease, stage 3b: Secondary | ICD-10-CM | POA: Diagnosis not present

## 2024-11-24 DIAGNOSIS — I129 Hypertensive chronic kidney disease with stage 1 through stage 4 chronic kidney disease, or unspecified chronic kidney disease: Secondary | ICD-10-CM | POA: Diagnosis not present

## 2024-11-24 DIAGNOSIS — N2581 Secondary hyperparathyroidism of renal origin: Secondary | ICD-10-CM | POA: Diagnosis not present

## 2024-11-25 ENCOUNTER — Ambulatory Visit (HOSPITAL_COMMUNITY)
Admission: RE | Admit: 2024-11-25 | Discharge: 2024-11-25 | Disposition: A | Discharge status: Home / Self Care | Source: Ambulatory Visit | Attending: Internal Medicine | Admitting: Internal Medicine

## 2024-11-25 ENCOUNTER — Inpatient Hospital Stay: Attending: Internal Medicine

## 2024-11-25 DIAGNOSIS — R5381 Other malaise: Secondary | ICD-10-CM | POA: Diagnosis not present

## 2024-11-25 DIAGNOSIS — Z882 Allergy status to sulfonamides status: Secondary | ICD-10-CM | POA: Insufficient documentation

## 2024-11-25 DIAGNOSIS — J479 Bronchiectasis, uncomplicated: Secondary | ICD-10-CM | POA: Insufficient documentation

## 2024-11-25 DIAGNOSIS — E039 Hypothyroidism, unspecified: Secondary | ICD-10-CM | POA: Diagnosis not present

## 2024-11-25 DIAGNOSIS — J849 Interstitial pulmonary disease, unspecified: Secondary | ICD-10-CM | POA: Diagnosis not present

## 2024-11-25 DIAGNOSIS — J948 Other specified pleural conditions: Secondary | ICD-10-CM | POA: Insufficient documentation

## 2024-11-25 DIAGNOSIS — Z86006 Personal history of melanoma in-situ: Secondary | ICD-10-CM | POA: Diagnosis not present

## 2024-11-25 DIAGNOSIS — C3490 Malignant neoplasm of unspecified part of unspecified bronchus or lung: Secondary | ICD-10-CM | POA: Diagnosis not present

## 2024-11-25 DIAGNOSIS — J432 Centrilobular emphysema: Secondary | ICD-10-CM | POA: Insufficient documentation

## 2024-11-25 DIAGNOSIS — Z8701 Personal history of pneumonia (recurrent): Secondary | ICD-10-CM | POA: Insufficient documentation

## 2024-11-25 DIAGNOSIS — R7989 Other specified abnormal findings of blood chemistry: Secondary | ICD-10-CM | POA: Insufficient documentation

## 2024-11-25 DIAGNOSIS — J90 Pleural effusion, not elsewhere classified: Secondary | ICD-10-CM | POA: Insufficient documentation

## 2024-11-25 DIAGNOSIS — Z7982 Long term (current) use of aspirin: Secondary | ICD-10-CM | POA: Insufficient documentation

## 2024-11-25 DIAGNOSIS — Z79899 Other long term (current) drug therapy: Secondary | ICD-10-CM | POA: Insufficient documentation

## 2024-11-25 DIAGNOSIS — Z885 Allergy status to narcotic agent status: Secondary | ICD-10-CM | POA: Insufficient documentation

## 2024-11-25 DIAGNOSIS — Z79890 Hormone replacement therapy (postmenopausal): Secondary | ICD-10-CM | POA: Insufficient documentation

## 2024-11-25 DIAGNOSIS — M479 Spondylosis, unspecified: Secondary | ICD-10-CM | POA: Insufficient documentation

## 2024-11-25 DIAGNOSIS — I1310 Hypertensive heart and chronic kidney disease without heart failure, with stage 1 through stage 4 chronic kidney disease, or unspecified chronic kidney disease: Secondary | ICD-10-CM | POA: Insufficient documentation

## 2024-11-25 DIAGNOSIS — C679 Malignant neoplasm of bladder, unspecified: Secondary | ICD-10-CM | POA: Insufficient documentation

## 2024-11-25 DIAGNOSIS — Z923 Personal history of irradiation: Secondary | ICD-10-CM | POA: Diagnosis not present

## 2024-11-25 DIAGNOSIS — F32A Depression, unspecified: Secondary | ICD-10-CM | POA: Insufficient documentation

## 2024-11-25 DIAGNOSIS — Z9049 Acquired absence of other specified parts of digestive tract: Secondary | ICD-10-CM | POA: Insufficient documentation

## 2024-11-25 DIAGNOSIS — C649 Malignant neoplasm of unspecified kidney, except renal pelvis: Secondary | ICD-10-CM | POA: Diagnosis not present

## 2024-11-25 DIAGNOSIS — C3411 Malignant neoplasm of upper lobe, right bronchus or lung: Secondary | ICD-10-CM | POA: Diagnosis not present

## 2024-11-25 DIAGNOSIS — C9110 Chronic lymphocytic leukemia of B-cell type not having achieved remission: Secondary | ICD-10-CM | POA: Diagnosis present

## 2024-11-25 DIAGNOSIS — I700 Atherosclerosis of aorta: Secondary | ICD-10-CM | POA: Insufficient documentation

## 2024-11-25 DIAGNOSIS — N189 Chronic kidney disease, unspecified: Secondary | ICD-10-CM | POA: Diagnosis not present

## 2024-11-25 DIAGNOSIS — C3412 Malignant neoplasm of upper lobe, left bronchus or lung: Secondary | ICD-10-CM | POA: Diagnosis present

## 2024-11-25 DIAGNOSIS — D696 Thrombocytopenia, unspecified: Secondary | ICD-10-CM | POA: Insufficient documentation

## 2024-11-25 DIAGNOSIS — Z905 Acquired absence of kidney: Secondary | ICD-10-CM | POA: Insufficient documentation

## 2024-11-25 DIAGNOSIS — Z860100 Personal history of colon polyps, unspecified: Secondary | ICD-10-CM | POA: Insufficient documentation

## 2024-11-25 LAB — CMP (CANCER CENTER ONLY)
ALT: 17 U/L (ref 0–44)
AST: 28 U/L (ref 15–41)
Albumin: 4.1 g/dL (ref 3.5–5.0)
Alkaline Phosphatase: 120 U/L (ref 38–126)
Anion gap: 11 (ref 5–15)
BUN: 30 mg/dL — ABNORMAL HIGH (ref 8–23)
CO2: 22 mmol/L (ref 22–32)
Calcium: 9.9 mg/dL (ref 8.9–10.3)
Chloride: 108 mmol/L (ref 98–111)
Creatinine: 1.85 mg/dL — ABNORMAL HIGH (ref 0.61–1.24)
GFR, Estimated: 37 mL/min — ABNORMAL LOW (ref >60)
Glucose, Bld: 111 mg/dL — ABNORMAL HIGH (ref 70–99)
Potassium: 3.8 mmol/L (ref 3.5–5.1)
Sodium: 141 mmol/L (ref 135–145)
Total Bilirubin: 0.4 mg/dL (ref 0.0–1.2)
Total Protein: 6.5 g/dL (ref 6.5–8.1)

## 2024-11-25 LAB — CBC WITH DIFFERENTIAL (CANCER CENTER ONLY)
Abs Immature Granulocytes: 0.01 K/uL (ref 0.00–0.07)
Basophils Absolute: 0 K/uL (ref 0.0–0.1)
Basophils Relative: 0 %
Eosinophils Absolute: 0 K/uL (ref 0.0–0.5)
Eosinophils Relative: 1 %
HCT: 28.4 % — ABNORMAL LOW (ref 39.0–52.0)
Hemoglobin: 8.6 g/dL — ABNORMAL LOW (ref 13.0–17.0)
Immature Granulocytes: 0 %
Lymphocytes Relative: 48 %
Lymphs Abs: 2.4 K/uL (ref 0.7–4.0)
MCH: 24.1 pg — ABNORMAL LOW (ref 26.0–34.0)
MCHC: 30.3 g/dL (ref 30.0–36.0)
MCV: 79.6 fL — ABNORMAL LOW (ref 80.0–100.0)
Monocytes Absolute: 0.2 K/uL (ref 0.1–1.0)
Monocytes Relative: 5 %
Neutro Abs: 2.3 K/uL (ref 1.7–7.7)
Neutrophils Relative %: 46 %
Platelet Count: 84 K/uL — ABNORMAL LOW (ref 150–400)
RBC: 3.57 MIL/uL — ABNORMAL LOW (ref 4.22–5.81)
RDW: 16.1 % — ABNORMAL HIGH (ref 11.5–15.5)
WBC Count: 5 K/uL (ref 4.0–10.5)
nRBC: 0 % (ref 0.0–0.2)

## 2024-11-25 MED ORDER — SODIUM CHLORIDE (PF) 0.9 % IJ SOLN
INTRAMUSCULAR | Status: AC
  Filled 2024-11-25: qty 50

## 2024-11-25 MED ORDER — IOHEXOL 300 MG/ML  SOLN
80.0000 mL | Freq: Once | INTRAMUSCULAR | Status: AC | PRN
  Administered 2024-11-25: 80 mL via INTRAVENOUS

## 2024-12-02 ENCOUNTER — Inpatient Hospital Stay: Admitting: Internal Medicine

## 2024-12-02 ENCOUNTER — Inpatient Hospital Stay

## 2024-12-02 VITALS — BP 148/65 | HR 99 | Temp 97.8°F | Resp 17 | Ht 66.0 in | Wt 162.0 lb

## 2024-12-02 DIAGNOSIS — D539 Nutritional anemia, unspecified: Secondary | ICD-10-CM | POA: Diagnosis not present

## 2024-12-02 DIAGNOSIS — C9110 Chronic lymphocytic leukemia of B-cell type not having achieved remission: Secondary | ICD-10-CM | POA: Diagnosis not present

## 2024-12-02 LAB — FOLATE: Folate: 11.2 ng/mL (ref >5.9)

## 2024-12-02 LAB — VITAMIN B12: Vitamin B-12: 713 pg/mL (ref 180–914)

## 2024-12-02 LAB — FERRITIN: Ferritin: 13 ng/mL — ABNORMAL LOW (ref 24–336)

## 2024-12-02 NOTE — Progress Notes (Signed)
 [facility] [phone]   [phone]  OFFICE PROGRESS NOTE  Bradley Leita DEL, Bradley Irwin [address] [place] [address]  DIAGNOSIS: 1) recurrent non-small cell lung cancer presented as a stage Ia (T1b, N0, M0) adenocarcinoma diagnosed in May 2025. 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  Status post 6 weeks of BCG treatment. Diagnosed in November 2024.    PRIOR THERAPY:  1) Status post left upper lobe wedge resection with lymph node sampling.  2) Status post 6 weeks of intravesical BCG treatment. Diagnosed in November 2024. Completed in January 2025 3) SBRT to recurrent stage Ia non-small cell lung cancer, adenocarcinoma of the left upper lobe under the care of Dr. Dewey completed May 16, 2024   CURRENT THERAPY: Observation.  INTERVAL HISTORY: Bradley Irwin 78 y.o. male returns to the clinic today for follow-up visit accompanied by his wife.Discussed the use of AI scribe software for clinical note transcription with the patient, who gave verbal consent to proceed.  History of Present Illness Bradley Irwin is a 78 year old male with recurrent non-small cell lung cancer who presents for evaluation with repeat CT scan for restaging of his disease. He is accompanied by his wife.  He has a history of recurrent non-small cell lung cancer, initially diagnosed as stage 1A adenocarcinoma in May 2025, with a positive MET X114 splice mutation and PD-L1 expression of 90%. He experiences intermittent chest pain, shortness of breath, general malaise, depression, and fatigue. He is currently undergoing BCG treatment for bladder cancer in situ, treated in November 2024.  He has a history of small  lymphocytic lymphoma (SLL), diagnosed in 2021. His hemoglobin has decreased from 12.4 in April to 8.6 currently, and his platelet count has declined from 147,000 in April to 84,000. His serum creatinine is elevated at 1.85.  He is currently taking B12 and vitamin C supplements but not iron. He had a normal colonoscopy one to two years ago. He is concerned about a deteriorating backbone noted in a recent CT scan report.  No blood in stool or urine and no other bleeding.    MEDICAL HISTORY: Past Medical History:  Diagnosis Date   Anxiety    Atypical mole 06/08/2010   Right Abdomen(moderate) terril)   Atypical mole 04/25/1995   Left Upper ShoulderBlade(slight)   Atypical mole 12/31/2013   Left Mid Back(Moderate) terril)   Bladder cancer (HCC)    CLL (chronic lymphocytic leukemia) (HCC)    Colon polyps    Coronary artery disease    Eczema    GERD (gastroesophageal reflux disease)    Gout    Hearing loss    Hx of cardiovascular stress test    Lexiscan  Myoview  5/16:  The study is normal. No ischemia identified. This is a low risk study. Overall left ventricular systolic function was normal. LV cavity size is normal. The left ventricular ejection fraction is normal (55-65%). Sensitivity reduced by bowel loop attenuation artifact.   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  MEDICATIONS:  Current Outpatient Medications  Medication Sig Dispense Refill   acetaminophen  (TYLENOL ) 500 MG tablet Take 500 mg by mouth at bedtime.     alfuzosin (UROXATRAL) 10 MG 24 hr tablet Take 10  mg by mouth daily.     allopurinol (ZYLOPRIM) 100 MG tablet Take 100 mg by mouth daily.     ALPRAZolam  (XANAX ) 0.5 MG tablet Take 0.5 mg by mouth 2 (two) times daily as needed for anxiety.      amitriptyline (ELAVIL) 10 MG tablet Take 10 mg by mouth at bedtime as needed.     aspirin  EC 81 MG tablet Take 81 mg by mouth daily. Swallow whole.     budesonide-glycopyrrolate-formoterol (BREZTRI  AEROSPHERE) 160-9-4.8 MCG/ACT AERO inhaler Inhale 2 puffs into the lungs in the morning and at bedtime.     clidinium-chlordiazePOXIDE  (LIBRAX ) 5-2.5 MG capsule Take 1 capsule by mouth daily as needed (ibs).     Colchicine 0.6 MG CAPS Take 1 tablet by mouth as needed (gout).     Cranberry-Vitamin C-Probiotic (AZO CRANBERRY PO) Take 2 tablets by mouth daily.     diclofenac Sodium (VOLTAREN) 1 % GEL Apply 1 Application topically 4 (four) times daily as needed (pain).     dicyclomine (BENTYL) 20 MG tablet Take 20 mg by mouth daily as needed for spasms.     Docosanol (ABREVA) 10 % CREA Apply 1 Application topically daily as needed (fever blisters).     Docusate Sodium  (DSS) 100 MG CAPS Take 100 mg by mouth 2 (two) times daily as needed (constipation).     ferrous sulfate 325 (65 FE) MG tablet Take 325 mg by mouth daily. (Patient not taking: Reported on 10/29/2024)     fluticasone (FLONASE) 50 MCG/ACT nasal spray Place 1 spray into both nostrils daily.     hydrOXYzine (ATARAX) 25 MG tablet Take 25 mg by mouth as needed for itching.     levothyroxine  (SYNTHROID ) 137 MCG tablet Take 137 mcg by mouth every morning.     Methenamine-Sodium Salicylate (AZO URINARY TRACT DEFENSE) 162-162.5 MG TABS Take 1 tablet by mouth as needed (urinary pain).     nitroGLYCERIN  (NITROSTAT ) 0.4 MG SL tablet Place 0.4 mg under the tongue every 5 (five) minutes as needed for chest pain.     omeprazole (PRILOSEC) 20 MG capsule Take 20 mg by mouth 2 (two) times daily.      oxybutynin  (DITROPAN ) 5 MG tablet Take 1 tablet (5 mg total) by mouth 2  (two) times daily as needed for bladder spasms. 15 tablet 1   polyethylene glycol (MIRALAX / GLYCOLAX) 17 g packet Take 17 g by mouth daily.     Probiotic Product (PROBIOTIC PO) Take 1 capsule by mouth daily.     rosuvastatin (CRESTOR) 10 MG tablet Take 10 mg by mouth daily.     simethicone  (MYLICON) 125 MG chewable tablet Chew 125 mg by mouth every 6 (six) hours as needed for flatulence.     tadalafil (CIALIS) 5 MG tablet      Vibegron (GEMTESA) 75 MG TABS Take 1 tablet by mouth daily.     No current facility-administered medications for this visit.  SURGICAL HISTORY:  Past Surgical History:  Procedure Laterality Date   CARDIAC CATHETERIZATION     CHOLECYSTECTOMY  10/26/2011   COLONOSCOPY     CORONARY STENT PLACEMENT  2006   CYSTOSCOPY W/ RETROGRADES Left 10/02/2023   Procedure: CYSTOSCOPY WITH RETROGRADE PYELOGRAM;  Surgeon: Nieves Cough, Bradley Irwin;  Location: [facility];  Service: Urology;  Laterality: Left;  60 MINS FOR CASE   CYSTOSCOPY WITH BIOPSY N/A 10/02/2023   Procedure: CYSTOSCOPY WITH BLADDER BIOPSY FULGURATION;  Surgeon: Nieves Cough, Bradley Irwin;  Location: [facility];  Service: Urology;  Laterality: N/A;   CYSTOSCOPY WITH BIOPSY Left 10/30/2023   Procedure: CYSTOSCOPY WITH RETROROGRADE;  Surgeon: Nieves Cough, Bradley Irwin;  Location: [facility];  Service: Urology;  Laterality: Left;   CYSTOSCOPY WITH URETEROSCOPY AND STENT PLACEMENT Left 10/30/2023   Procedure: CYSTOSCOPY WITH  LEFT URETEROSCOPY AND LEFT URETERAL STENT EXCHANGE;  Surgeon: Nieves Cough, Bradley Irwin;  Location: [facility];  Service: Urology;  Laterality: Left;  75 MINUTES   CYSTOSCOPY/URETEROSCOPY/HOLMIUM LASER/STENT PLACEMENT Left 10/02/2023   Procedure: CYSTOSCOPY, LEFT DIAGNOSTIC URETEROSCOPY/STENT PLACEMENT;  Surgeon: Nieves Cough, Bradley Irwin;  Location: [facility];  Service: Urology;  Laterality: Left;   FINGER SURGERY Right 78 y/o   cut index rt   INTERCOSTAL NERVE BLOCK  06/16/2019   Procedure:  Intercostal Nerve Block;  Surgeon: Army Dallas NOVAK, Bradley Irwin;  Location: [facility];  Service: Thoracic;;   LIPOMA EXCISION Left 11/18/2013   Procedure: EXCISION POSTERIOR NECK LIPOMA ;  Surgeon: Jina Nephew, Bradley Irwin;  Location: [facility];  Service: General;  Laterality: Left;   LIVER BIOPSY  09/18/2011   LIVER LOBECTOMY  10/26/2011   LYMPH NODE DISSECTION  06/16/2019   Procedure: Lymph Node Dissection;  Surgeon: Army Dallas NOVAK, Bradley Irwin;  Location: [facility] OR;  Service: Thoracic;;   NEPHRECTOMY Right 2008   right   TONSILLECTOMY     VIDEO ASSISTED THORACOSCOPY (VATS)/WEDGE RESECTION Left 06/16/2019   Procedure: VIDEO ASSISTED THORACOSCOPY (VATS)/MINI THOROCOTOMY WITH LEFT UPPER LOBE WEDGE RESECTION;  Surgeon: Army Dallas NOVAK, Bradley Irwin;  Location: [facility];  Service: Thoracic;  Laterality: Left;   VIDEO BRONCHOSCOPY N/A 06/16/2019   Procedure: VIDEO BRONCHOSCOPY;  Surgeon: Army Dallas NOVAK, Bradley Irwin;  Location: [facility];  Service: Thoracic;  Laterality: N/A;    REVIEW OF SYSTEMS:  Constitutional: positive for fatigue Eyes: negative Ears, nose, mouth, throat, and face: negative Respiratory: positive for dyspnea on exertion Cardiovascular: negative Gastrointestinal: negative Genitourinary:negative Integument/breast: negative Hematologic/lymphatic: negative Musculoskeletal:positive for arthralgias Neurological: negative Behavioral/Psych: negative Endocrine: negative Allergic/Immunologic: negative   PHYSICAL EXAMINATION: General appearance: alert, cooperative, fatigued, and no distress Head: Normocephalic, without obvious abnormality, atraumatic Neck: no adenopathy, no JVD, supple, symmetrical, trachea midline, and thyroid not enlarged, symmetric, no tenderness/mass/nodules Lymph nodes: Cervical, supraclavicular, and axillary nodes normal. Resp: clear to auscultation bilaterally Back: symmetric, no curvature. ROM normal. No CVA tenderness. Cardio: regular rate and rhythm, S1, S2 normal, no murmur,  click, rub or gallop GI: soft, non-tender; bowel sounds normal; no masses,  no organomegaly Extremities: extremities normal, atraumatic, no cyanosis or edema Neurologic: Alert and oriented X 3, normal strength and tone. Normal symmetric reflexes. Normal coordination and gait  ECOG PERFORMANCE STATUS: 1 - Symptomatic but completely ambulatory  Blood pressure (!) 148/65, pulse 99, temperature 97.8 F (36.6 C), temperature source Temporal, resp. rate 17, height 5' 6 (1.676 m), weight 162 lb (73.5 kg), SpO2 100%.  LABORATORY DATA: Lab Results  Component Value Date   WBC 5.0 11/25/2024   HGB 8.6 (L) 11/25/2024  HCT 28.4 (L) 11/25/2024   MCV 79.6 (L) 11/25/2024   PLT 84 (L) 11/25/2024      Chemistry      Component Value Date/Time   NA 141 11/25/2024 0949   K 3.8 11/25/2024 0949   CL 108 11/25/2024 0949   CO2 22 11/25/2024 0949   BUN 30 (H) 11/25/2024 0949   CREATININE 1.85 (H) 11/25/2024 0949   CREATININE 1.30 (H) 02/06/2016 1714      Component Value Date/Time   CALCIUM  9.9 11/25/2024 0949   ALKPHOS 120 11/25/2024 0949   AST 28 11/25/2024 0949   ALT 17 11/25/2024 0949   BILITOT 0.4 11/25/2024 0949       RADIOGRAPHIC STUDIES: CT CHEST ABDOMEN PELVIS W CONTRAST Result Date: 12/01/2024 EXAM: CT CHEST, ABDOMEN AND PELVIS WITH CONTRAST 11/25/2024 11:40:27 AM TECHNIQUE: CT of the chest, abdomen and pelvis was performed with the administration of 100 cc Omnipaque  intravenous contrast. Multiplanar reformatted images are provided for review. Automated exposure control, iterative reconstruction, and/or weight based adjustment of the mA/kV was utilized to reduce the radiation dose to as low as reasonably achievable. COMPARISON: CT dated 08/20/2024. CLINICAL HISTORY: Non-small cell lung cancer (NSCLC), staging. Renal cell carcinoma and bladder cancer. * Tracking Code: BO * FINDINGS: CHEST: MEDIASTINUM AND LYMPH NODES: Heart and pericardium are unremarkable. The central airways are clear.  Bulky bilateral enlarged axillary lymph nodes. Example lymph node on the left measures 14 mm on image 21 compared to 22 mm. Example node on the right measures 11 mm compared to 14 mm. Enlarged mediastinal lymph nodes and hilar nodes. Right hilar node measuring 20 mm compares to 17 mm. Right paratracheal node measures 12 mm compared to 12 mm. No new adenopathy. Bilateral axillary and mediastinal lymphadenopathy no significant change from prior exam. Findings consistent with given history of chronic lymphascitic leukemia. LUNGS AND PLEURA: Perihilar consolidation in the left lung with central air bronchograms has progressed in the interval with more confluence on prior image 62/4. Increased perihilar consolidation in the left lung is favored post radiopaque benign change. Small left pleural effusion is new from prior. Ill defined nodule in the right upper lobe measuring 19 mm is unchanged on image 36. Stable right upper lobe nodule. No new pulmonary nodules. No pneumothorax. ABDOMEN AND PELVIS: LIVER: No surgical marginal on the right hepatic lobe. No enhancing hepatic lesion. GALLBLADDER AND BILE DUCTS: Gallbladder is unremarkable. No biliary ductal dilatation. SPLEEN: Multiple odontoid lesions in the spleen unchanged from prior. PANCREAS: No acute abnormality. ADRENAL GLANDS: Adrenal glands are normal. KIDNEYS, URETERS AND BLADDER: Post right nephrectomy. No nodularity in the right nephrectomy bed. Left kidney is normal. Bladder is normal. No stones in the left kidney or left ureter. No hydronephrosis of the left kidney. No perinephric or periureteral stranding on the left. GI AND BOWEL: Stomach demonstrates no acute abnormality. There is no bowel obstruction. REPRODUCTIVE ORGANS: Prostate unremarkable. PERITONEUM AND RETROPERITONEUM: No ascites. No free air. VASCULATURE: Aorta is normal in caliber. ABDOMINAL AND PELVIS LYMPH NODES: Stable enlarged external iliac lymph nodes. No new adenopathy in the pelvis. Pelvic  lymphadenopathy no significant change from prior exam. No evidence of metastatic disease in the pelvis. BONES AND SOFT TISSUES: Sclerotic lesion in the right iliac bone is unchanged on image 95. No new lesions are present in the skeleton. No focal soft tissue abnormality. IMPRESSION: 1. Increased left perihilar consolidation with air bronchograms, favored posttreatment change. 2. New small left pleural effusion. 3. Stable 19 mm right upper lobe pulmonary  nodule, unchanged. 4. Bilateral axillary, mediastinal, and pelvic lymphadenopathy without significant interval change. History of CLL. 5. Post right nephrectomy without evidence of local recurrence. Electronically signed by: Norleen Boxer Bradley Irwin 12/01/2024 03:22 PM EST RP Workstation: [id number]   CT CHEST HIGH RESOLUTION Result Date: 11/07/2024 CLINICAL DATA:  Interstitial lung disease. History of bladder cancer, renal cell carcinoma, melanoma an CLL. Lung cancer. * Tracking Code: BO * EXAM: CT CHEST WITHOUT CONTRAST TECHNIQUE: Multidetector CT imaging of the chest was performed following the standard protocol without intravenous contrast. High resolution imaging of the lungs, as well as inspiratory and expiratory imaging, was performed. RADIATION DOSE REDUCTION: This exam was performed according to the departmental dose-optimization program which includes automated exposure control, adjustment of the mA and/or kV according to patient size and/or use of iterative reconstruction technique. COMPARISON:  08/20/2024, 11/15/2023 PET 03/14/2024. FINDINGS: Cardiovascular: Atherosclerotic calcification of the aorta and coronary arteries. Heart is enlarged. No pericardial effusion. Mediastinum/Nodes: Numerous thoracic inlet, mediastinal and axillary lymph nodes. Index high right paratracheal lymph node measures 13 mm, stable. Esophagus is grossly unremarkable. Lungs/Pleura: Centrilobular emphysema. Ground-glass nodule in the posterior segment right upper lobe measures 7 x 16  mm (8/35), unchanged. Cylindrical bronchiectasis. 4 mm inferior right upper lobe nodule (8/76), new. Left upper lobe wedge resection. Parenchymal retraction, bronchiectasis and architectural distortion in the perihilar left upper and left lower lobes, as before. Trace loculated left pleural fluid. Mild basilar coarsened parenchymal ground-glass with. Mild subpleural reticulation. No architectural distortion or honeycombing. Airway is unremarkable. There is air trapping. Upper Abdomen: Liver margin is slightly irregular. Cholecystectomy. Partially imaged low-attenuation lesion in the spleen, better seen on 08/20/2024. Spleen appears enlarged but is incompletely imaged. Visualized portions of the liver, adrenal glands, spleen, pancreas, stomach and bowel are otherwise grossly unremarkable. No upper abdominal adenopathy. Musculoskeletal: Degenerative changes in the spine. No worrisome lytic or sclerotic lesions. IMPRESSION: 1. Pulmonary parenchymal pattern of basilar coarsened parenchymal ground-glass, mild subpleural reticulation and bronchiectasis with air trapping, findings which may be due to fibrotic hypersensitivity pneumonitis or fibrotic nonspecific interstitial pneumonitis. Findings are suggestive of an alternative diagnosis (not UIP) per consensus guidelines: Diagnosis of Idiopathic Pulmonary Fibrosis: An Official ATS/ERS/JRS/ALAT Clinical Practice Guideline. Am JINNY Honey Crit Care Med Vol 198, Iss 5, [phone], Aug 25 2017. 2. New 4 mm inferior right upper lobe nodule and stable ground-glass posterior segment right upper lobe nodule. Recommend attention on follow-up. 3. Post radiation and postsurgical scarring in the left upper and left lower lobes. 4. Numerous thoracic lymph nodes, compatible with the history of CLL. Spleen may be enlarged but is incompletely imaged. 5. Liver margin is slightly irregular, raising suspicion for cirrhosis. 6. Aortic atherosclerosis (ICD10-I70.0). Coronary artery calcification.  7.  Emphysema (ICD10-J43.9). Electronically Signed   By: Newell Eke M.D.   On: 11/07/2024 10:11     ASSESSMENT AND PLAN: This is a very pleasant 78 years old white male with stage IA non-small cell lung cancer, adenocarcinoma with positive MET 14 splice mutation status post wedge resection of the left upper lobe with lymph node sampling in June 2020.  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 He also had recurrent stage Ia non-small cell lung cancer, adenocarcinoma involving the right upper lobe status post SBRT in May 2025. The patient is currently on observation and he is feeling fine with no concerning complaints except for the recent hematuria followed by urology. The patient had repeat CT scan of the chest, abdomen and pelvis  performed recently.  I personally independently reviewed the scan and discussed the result with the patient and his wife.  His scan showed no concerning findings for disease progression. Assessment and Plan Assessment & Plan Recurrent non-small cell lung cancer, adenocarcinoma with MET exon 14 splice mutation and high PD-L1 expression No new growths on recent CT scan. Radiation treatment appears effective. Interstitial lung disease and small left pleural effusion present, managed by pulmonary medicine. Lymph node involvement attributed to CLL. - Continue follow-up with pulmonary medicine for interstitial lung disease and pleural effusion.  Chronic lymphocytic leukemia/small lymphocytic lymphoma Lymph node involvement on CT scan. White blood count is well-managed. Concerns about CLL affecting bone marrow due to anemia and thrombocytopenia. He initially hesitant about bone marrow biopsy but agreed after discussion. - Ordered bone marrow biopsy to assess CLL impact on bone marrow. - Scheduled bone marrow biopsy for the week after Christmas.  Anemia and thrombocytopenia under evaluation Hemoglobin decreased to 8.6 from 10.4 in  August and 12.4 in April. Platelets decreased to 84,000 from 147,000 in April. No evidence of bleeding. Possible CLL involvement in bone marrow. He prefers to try iron supplementation before proceeding with biopsy. - Ordered blood work to evaluate anemia and thrombocytopenia, including iron studies and B12 levels. - Initiated iron supplementation.  Bladder carcinoma in situ, on BCG therapy Currently undergoing BCG treatment. No hematuria reported. Urinary symptoms post-BCG treatment noted. - Continue BCG therapy as scheduled.  Chronic kidney disease Serum creatinine elevated at 1.85, indicating worsening kidney function. - Continue to monitor kidney function. He was advised to call immediately if he has any other concerning symptoms in the interval. The patient voices understanding of current disease status and treatment options and is in agreement with the current care plan.  All questions were answered. The patient knows to call the clinic with any problems, questions or concerns. We can certainly see the patient much sooner if necessary. The total time spent in the appointment was 55 minutes.  Disclaimer: This note was dictated with voice recognition software. Similar sounding words can inadvertently be transcribed and may not be corrected upon review.

## 2024-12-03 LAB — ERYTHROPOIETIN: Erythropoietin: 36.5 m[IU]/mL — ABNORMAL HIGH (ref 2.6–18.5)

## 2024-12-08 ENCOUNTER — Ambulatory Visit: Admitting: Pulmonary Disease

## 2024-12-08 ENCOUNTER — Other Ambulatory Visit: Payer: Self-pay | Admitting: Pulmonary Disease

## 2024-12-08 LAB — PROTEIN ELECTROPHORESIS, SERUM, WITH REFLEX
A/G Ratio: 1.1 (ref 0.7–1.7)
Albumin ELP: 3.4 g/dL (ref 2.9–4.4)
Alpha-1-Globulin: 0.3 g/dL (ref 0.0–0.4)
Alpha-2-Globulin: 1 g/dL (ref 0.4–1.0)
Beta Globulin: 1 g/dL (ref 0.7–1.3)
Gamma Globulin: 0.7 g/dL (ref 0.4–1.8)
Globulin, Total: 3 g/dL (ref 2.2–3.9)
M-Spike, %: 0.3 g/dL — ABNORMAL HIGH
SPEP Interpretation: 0
Total Protein ELP: 6.4 g/dL (ref 6.0–8.5)

## 2024-12-08 LAB — IMMUNOFIXATION REFLEX, SERUM
IgA: 76 mg/dL (ref 61–437)
IgG (Immunoglobin G), Serum: 655 mg/dL (ref 603–1613)
IgM (Immunoglobulin M), Srm: 46 mg/dL (ref 15–143)

## 2024-12-08 NOTE — Telephone Encounter (Signed)
 Copied from CRM [id number]. Topic: Clinical - Medication Refill >> Dec 08, 2024  9:01 AM Corean SAUNDERS wrote: Medication: budesonide-glycopyrrolate-formoterol (BREZTRI  AEROSPHERE) 160-9-4.8 MCG/ACT AERO inhaler  Has the patient contacted their pharmacy? No, refills required. (Agent: If no, request that the patient contact the pharmacy for the refill. If patient does not wish to contact the pharmacy document the reason why and proceed with request.) (Agent: If yes, when and what did the pharmacy advise?)  This is the patient's preferred pharmacy:  [id number] [address] [place], [place] - [address] [place] [address] Phone: [phone] Fax: [phone]  Is this the correct pharmacy for this prescription? Yes If no, delete pharmacy and type the correct one.   Has the prescription been filled recently? Yes  Is the patient out of the medication? Yes  Has the patient been seen for an appointment in the last year OR does the patient have an upcoming appointment? Yes  Can we respond through MyChart? No  Agent: Please be advised that Rx refills may take up to 3 business days. We ask that you follow-up with your pharmacy.

## 2024-12-11 MED ORDER — BREZTRI AEROSPHERE 160-9-4.8 MCG/ACT IN AERO: 2.0000 | INHALATION_SPRAY | Freq: Two times a day (BID) | RESPIRATORY_TRACT | 3 refills | Status: AC

## 2024-12-12 NOTE — Telephone Encounter (Addendum)
 Copied from CRM #[id number]. Topic: Clinical - Prescription Issue >> Dec 11, 2024 10:01 AM Rilla B wrote: Reason for CRM: Patient's wife calling in to check the status of his Breztri  order placed on 12/08/2024. States they have not heard from the pharmacy and don't want him to run out. I did verify the pharmacy was correct.  Please call patient @ [phone]  Patient was called, no answer. Left him a message to call back.

## 2024-12-19 ENCOUNTER — Other Ambulatory Visit: Payer: Self-pay | Admitting: Radiology

## 2024-12-19 DIAGNOSIS — D539 Nutritional anemia, unspecified: Secondary | ICD-10-CM

## 2024-12-19 NOTE — H&P (Signed)
 "  Chief Complaint: Persistent anemia and thrombocytopenia, recurrent lung adenocarcinoma, bladder carcinoma in situ, small lymphocytic lymphoma; referred for image guided bone marrow biopsy for further evaluation  Referring Provider(s): Mohamed,M  Supervising Physician: Philip Cornet  Patient Status: [facility] - Out-pt  History of Present Illness: Bradley Irwin is a 78 y.o. male with past medical history significant for anxiety, bladder carcinoma in situ, small lymphocytic lymphoma/CLL, colon polyps, coronary artery disease, eczema, GERD, gout, hearing loss, hypertension, hyperlipidemia, hypothyroidism, IBS, melanoma in situ, psoriasis, PVD, chronic kidney disease, renal cell cancer 2007 who presents now with persistent anemia and thrombocytopenia of uncertain etiology.  He is scheduled today for image guided bone marrow biopsy for further evaluation.   Patient is Full Code  Past Medical History:  Diagnosis Date   Anxiety    Atypical mole 06/08/2010   Right Abdomen(moderate) terril)   Atypical mole 04/25/1995   Left Upper ShoulderBlade(slight)   Atypical mole 12/31/2013   Left Mid Back(Moderate) terril)   Bladder cancer (HCC)    CLL (chronic lymphocytic leukemia) (HCC)    Colon polyps    Coronary artery disease    Eczema    GERD (gastroesophageal reflux disease)    Gout    Hearing loss    Hx of cardiovascular stress test    Lexiscan  Myoview  5/16:  The study is normal. No ischemia identified. This is a low risk study. Overall left ventricular systolic function was normal. LV cavity size is normal. The left ventricular ejection fraction is normal (55-65%). Sensitivity reduced by bowel loop attenuation artifact.   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   Pneumonia    Psoriasis    PVD (peripheral vascular disease)    Renal cell  cancer (HCC) 2007   Squamous cell carcinoma in situ    Wears glasses    Wears hearing aid    both ears    Past Surgical History:  Procedure Laterality Date   CARDIAC CATHETERIZATION     CHOLECYSTECTOMY  10/26/2011   COLONOSCOPY     CORONARY STENT PLACEMENT  2006   CYSTOSCOPY W/ RETROGRADES Left 10/02/2023   Procedure: CYSTOSCOPY WITH RETROGRADE PYELOGRAM;  Surgeon: Nieves Cough, MD;  Location: [facility];  Service: Urology;  Laterality: Left;  60 MINS FOR CASE   CYSTOSCOPY WITH BIOPSY N/A 10/02/2023   Procedure: CYSTOSCOPY WITH BLADDER BIOPSY FULGURATION;  Surgeon: Nieves Cough, MD;  Location: [facility];  Service: Urology;  Laterality: N/A;   CYSTOSCOPY WITH BIOPSY Left 10/30/2023   Procedure: CYSTOSCOPY WITH RETROROGRADE;  Surgeon: Nieves Cough, MD;  Location: [facility];  Service: Urology;  Laterality: Left;   CYSTOSCOPY WITH URETEROSCOPY AND STENT PLACEMENT Left 10/30/2023   Procedure: CYSTOSCOPY WITH  LEFT URETEROSCOPY AND LEFT URETERAL STENT EXCHANGE;  Surgeon: Nieves Cough, MD;  Location: [facility];  Service: Urology;  Laterality: Left;  75 MINUTES   CYSTOSCOPY/URETEROSCOPY/HOLMIUM LASER/STENT PLACEMENT Left 10/02/2023   Procedure: CYSTOSCOPY, LEFT DIAGNOSTIC URETEROSCOPY/STENT PLACEMENT;  Surgeon: Nieves Cough, MD;  Location: [facility];  Service: Urology;  Laterality: Left;   FINGER SURGERY Right 78 y/o   cut index rt   INTERCOSTAL NERVE BLOCK  06/16/2019   Procedure: Intercostal Nerve Block;  Surgeon: Army Dallas NOVAK, MD;  Location: [facility] OR;  Service: Thoracic;;   LIPOMA EXCISION Left 11/18/2013  Procedure: EXCISION POSTERIOR NECK LIPOMA ;  Surgeon: Jina Nephew, MD;  Location: [facility];  Service: General;  Laterality: Left;   LIVER BIOPSY  09/18/2011   LIVER LOBECTOMY  10/26/2011   LYMPH NODE DISSECTION  06/16/2019   Procedure: Lymph Node Dissection;  Surgeon: Army Dallas NOVAK, MD;  Location: [facility] OR;  Service:  Thoracic;;   NEPHRECTOMY Right 2008   right   TONSILLECTOMY     VIDEO ASSISTED THORACOSCOPY (VATS)/WEDGE RESECTION Left 06/16/2019   Procedure: VIDEO ASSISTED THORACOSCOPY (VATS)/MINI THOROCOTOMY WITH LEFT UPPER LOBE WEDGE RESECTION;  Surgeon: Army Dallas NOVAK, MD;  Location: [facility];  Service: Thoracic;  Laterality: Left;   VIDEO BRONCHOSCOPY N/A 06/16/2019   Procedure: VIDEO BRONCHOSCOPY;  Surgeon: Army Dallas NOVAK, MD;  Location: [facility] OR;  Service: Thoracic;  Laterality: N/A;    Allergies: Sulfa antibiotics, Demerol, and Pseudoephedrine  Medications: Prior to Admission medications  Medication Sig Start Date End Date Taking? Authorizing Provider  acetaminophen  (TYLENOL ) 500 MG tablet Take 500 mg by mouth at bedtime.    [provider]  alfuzosin (UROXATRAL) 10 MG 24 hr tablet Take 10 mg by mouth daily. 08/07/22   [provider]  allopurinol (ZYLOPRIM) 100 MG tablet Take 100 mg by mouth daily.    [provider]  ALPRAZolam  (XANAX ) 0.5 MG tablet Take 0.5 mg by mouth 2 (two) times daily as needed for anxiety.     [provider]  amitriptyline (ELAVIL) 10 MG tablet Take 10 mg by mouth at bedtime as needed.    [provider]  aspirin  EC 81 MG tablet Take 81 mg by mouth daily. Swallow whole.    [provider]  budesonide-glycopyrrolate-formoterol (BREZTRI  AEROSPHERE) 160-9-4.8 MCG/ACT AERO inhaler Inhale 2 puffs into the lungs in the morning and at bedtime. 12/11/24   Mannam, Praveen, MD  clidinium-chlordiazePOXIDE  (LIBRAX ) 5-2.5 MG capsule Take 1 capsule by mouth daily as needed (ibs).    [provider]  Colchicine 0.6 MG CAPS Take 1 tablet by mouth as needed (gout). 04/18/21   [provider]  Cranberry-Vitamin C-Probiotic (AZO CRANBERRY PO) Take 2 tablets by mouth daily.    [provider]  diclofenac Sodium (VOLTAREN) 1 % GEL Apply 1 Application topically 4 (four) times daily as needed (pain). 04/20/21    [provider]  dicyclomine (BENTYL) 20 MG tablet Take 20 mg by mouth daily as needed for spasms. 01/01/20   [provider]  Docosanol (ABREVA) 10 % CREA Apply 1 Application topically daily as needed (fever blisters).    [provider]  Docusate Sodium  (DSS) 100 MG CAPS Take 100 mg by mouth 2 (two) times daily as needed (constipation). 02/10/20   [provider]  ferrous sulfate 325 (65 FE) MG tablet Take 325 mg by mouth daily. Patient not taking: Reported on 10/29/2024 08/01/21   [provider]  fluticasone (FLONASE) 50 MCG/ACT nasal spray Place 1 spray into both nostrils daily.    [provider]  hydrOXYzine (ATARAX) 25 MG tablet Take 25 mg by mouth as needed for itching.    [provider]  levothyroxine  (SYNTHROID ) 137 MCG tablet Take 137 mcg by mouth every morning. 04/18/24   [provider]  Methenamine-Sodium Salicylate (AZO URINARY TRACT DEFENSE) 162-162.5 MG TABS Take 1 tablet by mouth as needed (urinary pain).    [provider]  nitroGLYCERIN  (NITROSTAT ) 0.4 MG SL tablet Place 0.4 mg under the tongue every 5 (five) minutes as needed for chest pain.  [provider]  omeprazole (PRILOSEC) 20 MG capsule Take 20 mg by mouth 2 (two) times daily.     [provider]  oxybutynin  (DITROPAN ) 5 MG tablet Take 1 tablet (5 mg total) by mouth 2 (two) times daily as needed for bladder spasms. 10/02/23   Nieves Cough, MD  polyethylene glycol (MIRALAX / GLYCOLAX) 17 g packet Take 17 g by mouth daily.    [provider]  Probiotic Product (PROBIOTIC PO) Take 1 capsule by mouth daily.    [provider]  rosuvastatin (CRESTOR) 10 MG tablet Take 10 mg by mouth daily.    [provider]  simethicone  (MYLICON) 125 MG chewable tablet Chew 125 mg by mouth every 6 (six) hours as needed for flatulence.    [provider]  tadalafil (CIALIS) 5 MG tablet  02/25/24   [provider]  Vibegron (GEMTESA) 75 MG TABS Take 1 tablet by mouth daily.    [provider]     Family History  Problem Relation Age of Onset   Hypertension Mother    Heart failure Mother    Heart disease Mother    Aneurysm Mother        brain   Peripheral vascular disease Father    Lung cancer Father 19       smoking hx   Hypertension Sister    Breast cancer Sister 69 - 40   Myelodysplastic syndrome Sister 44   Breast cancer Maternal Aunt 60 - 69   Heart attack Maternal Uncle    Heart attack Maternal Grandfather    Colon cancer Neg Hx     Social History   Socioeconomic History   Marital status: Married    Spouse name: Not on file   Number of children: 0   Years of education: Not on file   Highest education level: Not on file  Occupational History   Occupation: Retired from state of [place]  Tobacco Use   Smoking status: Former    Current packs/day: 0.00    Average packs/day: 1 pack/day for 15.0 years (15.0 ttl pk-yrs)    Types: Cigarettes    Start date: 10/25/1968    Quit date: 10/26/1983    Years since quitting: 41.1   Smokeless tobacco: Never  Vaping Use   Vaping status: Never Used  Substance and Sexual Activity   Alcohol  use: Not Currently    Alcohol /week: 14.0 - 20.0 standard drinks of alcohol     Types: 14 - 20 Glasses of wine per week   Drug use: No   Sexual activity: Not Currently  Other Topics Concern   Not on file  Social History Narrative   Not on file   Social Drivers of Health   Tobacco Use: Medium Risk (10/29/2024)   Patient History    Smoking Tobacco Use: Former    Smokeless Tobacco Use: Never    Passive Exposure: Not on Actuary Strain: Not on file  Food Insecurity: Not on file  Transportation Needs: Not on file  Physical Activity: Not on file  Stress: Not on file  Social Connections: Not on file  Depression (EYV7-0): Not on file  Alcohol  Screen: Not on file  Housing: Not on file  Utilities: Not on file  Health  Literacy: Not on file       Review of Systems: denies fever,HA,CP,dyspnea, cough, abd/back pain,N/V or bleeding  Vital Signs: Vitals:   12/22/24 0857  BP: (!) 163/63  Pulse: (!) 104  Resp:  16  Temp: 99.1 F (37.3 C)  SpO2: 99%      Advance Care Plan: no documents on file    Physical Exam: awake/alert; chest- sl dim BS left base, right clear; heart- RRR; abd-soft,+BS,NT; no LE edema  Imaging: CT CHEST ABDOMEN PELVIS W CONTRAST Result Date: 12/01/2024 EXAM: CT CHEST, ABDOMEN AND PELVIS WITH CONTRAST 11/25/2024 11:40:27 AM TECHNIQUE: CT of the chest, abdomen and pelvis was performed with the administration of 100 cc Omnipaque  intravenous contrast. Multiplanar reformatted images are provided for review. Automated exposure control, iterative reconstruction, and/or weight based adjustment of the mA/kV was utilized to reduce the radiation dose to as low as reasonably achievable. COMPARISON: CT dated 08/20/2024. CLINICAL HISTORY: Non-small cell lung cancer (NSCLC), staging. Renal cell carcinoma and bladder cancer. * Tracking Code: BO * FINDINGS: CHEST: MEDIASTINUM AND LYMPH NODES: Heart and pericardium are unremarkable. The central airways are clear. Bulky bilateral enlarged axillary lymph nodes. Example lymph node on the left measures 14 mm on image 21 compared to 22 mm. Example node on the right measures 11 mm compared to 14 mm. Enlarged mediastinal lymph nodes and hilar nodes. Right hilar node measuring 20 mm compares to 17 mm. Right paratracheal node measures 12 mm compared to 12 mm. No new adenopathy. Bilateral axillary and mediastinal lymphadenopathy no significant change from prior exam. Findings consistent with given history of chronic lymphascitic leukemia. LUNGS AND PLEURA: Perihilar consolidation in the left lung with central air bronchograms has progressed in the interval with more confluence on prior image 62/4. Increased perihilar consolidation in the left lung is favored post  radiopaque benign change. Small left pleural effusion is new from prior. Ill defined nodule in the right upper lobe measuring 19 mm is unchanged on image 36. Stable right upper lobe nodule. No new pulmonary nodules. No pneumothorax. ABDOMEN AND PELVIS: LIVER: No surgical marginal on the right hepatic lobe. No enhancing hepatic lesion. GALLBLADDER AND BILE DUCTS: Gallbladder is unremarkable. No biliary ductal dilatation. SPLEEN: Multiple odontoid lesions in the spleen unchanged from prior. PANCREAS: No acute abnormality. ADRENAL GLANDS: Adrenal glands are normal. KIDNEYS, URETERS AND BLADDER: Post right nephrectomy. No nodularity in the right nephrectomy bed. Left kidney is normal. Bladder is normal. No stones in the left kidney or left ureter. No hydronephrosis of the left kidney. No perinephric or periureteral stranding on the left. GI AND BOWEL: Stomach demonstrates no acute abnormality. There is no bowel obstruction. REPRODUCTIVE ORGANS: Prostate unremarkable. PERITONEUM AND RETROPERITONEUM: No ascites. No free air. VASCULATURE: Aorta is normal in caliber. ABDOMINAL AND PELVIS LYMPH NODES: Stable enlarged external iliac lymph nodes. No new adenopathy in the pelvis. Pelvic lymphadenopathy no significant change from prior exam. No evidence of metastatic disease in the pelvis. BONES AND SOFT TISSUES: Sclerotic lesion in the right iliac bone is unchanged on image 95. No new lesions are present in the skeleton. No focal soft tissue abnormality. IMPRESSION: 1. Increased left perihilar consolidation with air bronchograms, favored posttreatment change. 2. New small left pleural effusion. 3. Stable 19 mm right upper lobe pulmonary nodule, unchanged. 4. Bilateral axillary, mediastinal, and pelvic lymphadenopathy without significant interval change. History of CLL. 5. Post right nephrectomy without evidence of local recurrence. Electronically signed by: Norleen Boxer MD 12/01/2024 03:22 PM EST RP Workstation: [id number]     Labs:  CBC: Recent Labs    02/19/24 0933 03/26/24 0908 08/20/24 0954 11/25/24 0949  WBC 13.2* 10.4 6.8 5.0  HGB 12.7* 12.4* 10.4* 8.6*  HCT 40.5 38.8* 33.3* 28.4*  PLT  207 147* 118* 84*    COAGS: No results for input(s): INR, APTT in the last 8760 hours.  BMP: Recent Labs    02/19/24 0933 03/26/24 0908 08/20/24 0954 11/25/24 0949  NA 139 138 140 141  K 4.1 4.1 4.1 3.8  CL 106 107 109 108  CO2 24 23 26 22   GLUCOSE 108* 105* 111* 111*  BUN 35* 40* 36* 30*  CALCIUM  10.3 10.1 9.8 9.9  CREATININE 1.90* 1.76* 1.70* 1.85*  GFRNONAA 36* 40* 41* 37*    LIVER FUNCTION TESTS: Recent Labs    02/19/24 0933 03/26/24 0908 08/20/24 0954 11/25/24 0949  BILITOT 0.5 0.4 0.5 0.4  AST 18 25 20 28   ALT 13 18 16 17   ALKPHOS 144* 153* 134* 120  PROT 7.0 7.0 6.3* 6.5  ALBUMIN 3.8 4.1 3.7 4.1    TUMOR MARKERS: No results for input(s): AFPTM, CEA, CA199, CHROMGRNA in the last 8760 hours.  Assessment and Plan: 78 y.o. male with past medical history significant for anxiety, bladder carcinoma in situ, small lymphocytic lymphoma/CLL, colon polyps, coronary artery disease, eczema, GERD, gout, hearing loss, hypertension, hyperlipidemia, hypothyroidism, IBS, melanoma in situ, psoriasis, PVD, chronic kidney disease, renal cell cancer 2007 who presents now with persistent anemia and thrombocytopenia of uncertain etiology.  He is scheduled today for image guided bone marrow biopsy for further evaluation.Risks and benefits of procedure was discussed with the patient  including, but not limited to bleeding, infection, damage to adjacent structures or low yield requiring additional tests.  All of the questions were answered and there is agreement to proceed.  Consent signed and in chart.  Patient known to IR team from right renal lesion biopsy in 2007 and liver lesion biopsy in 2012  Thank you for allowing our service to participate in ROMEY MATHIESON 's  care.  Electronically Signed: D. Franky Rakers, PA-C   12/19/2024, 12:57 PM      I spent a total of   20 minutes    in face to face in clinical consultation, greater than 50% of which was counseling/coordinating care for image guided bone marrow biopsy   "

## 2024-12-22 ENCOUNTER — Other Ambulatory Visit: Payer: Self-pay | Admitting: Internal Medicine

## 2024-12-22 ENCOUNTER — Ambulatory Visit (HOSPITAL_COMMUNITY)
Admission: RE | Admit: 2024-12-22 | Discharge: 2024-12-22 | Disposition: A | Discharge status: Home / Self Care | Source: Ambulatory Visit | Attending: Internal Medicine | Admitting: Internal Medicine

## 2024-12-22 ENCOUNTER — Encounter (HOSPITAL_COMMUNITY): Payer: Self-pay

## 2024-12-22 DIAGNOSIS — Z86006 Personal history of melanoma in-situ: Secondary | ICD-10-CM | POA: Diagnosis not present

## 2024-12-22 DIAGNOSIS — I739 Peripheral vascular disease, unspecified: Secondary | ICD-10-CM | POA: Diagnosis not present

## 2024-12-22 DIAGNOSIS — M109 Gout, unspecified: Secondary | ICD-10-CM | POA: Insufficient documentation

## 2024-12-22 DIAGNOSIS — D539 Nutritional anemia, unspecified: Secondary | ICD-10-CM

## 2024-12-22 DIAGNOSIS — N189 Chronic kidney disease, unspecified: Secondary | ICD-10-CM | POA: Insufficient documentation

## 2024-12-22 DIAGNOSIS — Z1379 Encounter for other screening for genetic and chromosomal anomalies: Secondary | ICD-10-CM | POA: Diagnosis not present

## 2024-12-22 DIAGNOSIS — D696 Thrombocytopenia, unspecified: Secondary | ICD-10-CM | POA: Insufficient documentation

## 2024-12-22 DIAGNOSIS — Z85528 Personal history of other malignant neoplasm of kidney: Secondary | ICD-10-CM | POA: Insufficient documentation

## 2024-12-22 DIAGNOSIS — C3490 Malignant neoplasm of unspecified part of unspecified bronchus or lung: Secondary | ICD-10-CM | POA: Diagnosis not present

## 2024-12-22 DIAGNOSIS — I129 Hypertensive chronic kidney disease with stage 1 through stage 4 chronic kidney disease, or unspecified chronic kidney disease: Secondary | ICD-10-CM | POA: Insufficient documentation

## 2024-12-22 DIAGNOSIS — C9110 Chronic lymphocytic leukemia of B-cell type not having achieved remission: Secondary | ICD-10-CM | POA: Insufficient documentation

## 2024-12-22 DIAGNOSIS — E039 Hypothyroidism, unspecified: Secondary | ICD-10-CM | POA: Diagnosis not present

## 2024-12-22 DIAGNOSIS — L409 Psoriasis, unspecified: Secondary | ICD-10-CM | POA: Insufficient documentation

## 2024-12-22 DIAGNOSIS — F419 Anxiety disorder, unspecified: Secondary | ICD-10-CM | POA: Insufficient documentation

## 2024-12-22 DIAGNOSIS — I2510 Atherosclerotic heart disease of native coronary artery without angina pectoris: Secondary | ICD-10-CM | POA: Diagnosis not present

## 2024-12-22 DIAGNOSIS — K219 Gastro-esophageal reflux disease without esophagitis: Secondary | ICD-10-CM | POA: Insufficient documentation

## 2024-12-22 DIAGNOSIS — Z86008 Personal history of in-situ neoplasm of other site: Secondary | ICD-10-CM | POA: Diagnosis not present

## 2024-12-22 DIAGNOSIS — K589 Irritable bowel syndrome without diarrhea: Secondary | ICD-10-CM | POA: Insufficient documentation

## 2024-12-22 DIAGNOSIS — Z860100 Personal history of colon polyps, unspecified: Secondary | ICD-10-CM | POA: Diagnosis not present

## 2024-12-22 DIAGNOSIS — E785 Hyperlipidemia, unspecified: Secondary | ICD-10-CM | POA: Insufficient documentation

## 2024-12-22 HISTORY — PX: IR BONE MARROW BIOPSY & ASPIRATION: IMG5727

## 2024-12-22 LAB — CBC WITH DIFFERENTIAL/PLATELET
Abs Immature Granulocytes: 0.01 K/uL (ref 0.00–0.07)
Basophils Absolute: 0 K/uL (ref 0.0–0.1)
Basophils Relative: 0 %
Eosinophils Absolute: 0 K/uL (ref 0.0–0.5)
Eosinophils Relative: 1 %
HCT: 28 % — ABNORMAL LOW (ref 39.0–52.0)
Hemoglobin: 8.2 g/dL — ABNORMAL LOW (ref 13.0–17.0)
Immature Granulocytes: 0 %
Lymphocytes Relative: 52 %
Lymphs Abs: 2.7 K/uL (ref 0.7–4.0)
MCH: 23.4 pg — ABNORMAL LOW (ref 26.0–34.0)
MCHC: 29.3 g/dL — ABNORMAL LOW (ref 30.0–36.0)
MCV: 80 fL (ref 80.0–100.0)
Monocytes Absolute: 0.2 K/uL (ref 0.1–1.0)
Monocytes Relative: 3 %
Neutro Abs: 2.4 K/uL (ref 1.7–7.7)
Neutrophils Relative %: 44 %
Platelets: 111 K/uL — ABNORMAL LOW (ref 150–400)
RBC: 3.5 MIL/uL — ABNORMAL LOW (ref 4.22–5.81)
RDW: 15.6 % — ABNORMAL HIGH (ref 11.5–15.5)
WBC: 5.3 K/uL (ref 4.0–10.5)
nRBC: 0 % (ref 0.0–0.2)

## 2024-12-22 MED ORDER — FENTANYL CITRATE (PF) 100 MCG/2ML IJ SOLN
INTRAMUSCULAR | Status: AC | PRN
  Administered 2024-12-22: 50 ug via INTRAVENOUS

## 2024-12-22 MED ORDER — MIDAZOLAM HCL (PF) 2 MG/2ML IJ SOLN
INTRAMUSCULAR | Status: AC | PRN
  Administered 2024-12-22: 1 mg via INTRAVENOUS

## 2024-12-22 MED ORDER — SODIUM CHLORIDE 0.9 % IV SOLN: INTRAVENOUS | Status: DC

## 2024-12-22 MED ORDER — FENTANYL CITRATE (PF) 100 MCG/2ML IJ SOLN
INTRAMUSCULAR | Status: AC
  Filled 2024-12-22: qty 2

## 2024-12-22 MED ORDER — LIDOCAINE HCL (PF) 1 % IJ SOLN
INTRAMUSCULAR | Status: AC
  Filled 2024-12-22: qty 30

## 2024-12-22 MED ORDER — MIDAZOLAM HCL 2 MG/2ML IJ SOLN
INTRAMUSCULAR | Status: AC
  Filled 2024-12-22: qty 2

## 2024-12-22 MED ORDER — LIDOCAINE HCL (PF) 1 % IJ SOLN
30.0000 mL | Freq: Once | INTRAMUSCULAR | Status: AC
  Administered 2024-12-22: 10 mL via INTRADERMAL

## 2024-12-22 NOTE — Discharge Instructions (Signed)
 CONTACT INTERVENTIONAL RADIOLOGY CLINIC AT [phone] WITH ANY QUESTIONS OR CONCERNS  MAY REMOVE DRESSING AND SHOWER TOMORROW

## 2024-12-22 NOTE — Procedures (Signed)
 Interventional Radiology Procedure:   Indications: Anemia and thrombocytopenia   Procedure: Image guided bone marrow biopsy  Findings: 2 aspirates and 1 core from right ilium  Complications: None     EBL: Minimal, less than 10 ml  Plan: Discharge to home in one hour.   Bradley Irwin R. Philip, MD  Pager: [phone]

## 2024-12-24 LAB — SURGICAL PATHOLOGY

## 2024-12-26 ENCOUNTER — Telehealth: Payer: Self-pay | Admitting: Physician Assistant

## 2024-12-26 ENCOUNTER — Encounter: Payer: Self-pay | Admitting: Internal Medicine

## 2024-12-26 NOTE — Telephone Encounter (Signed)
 Called pt and agreed on the date and time of the appt

## 2024-12-29 ENCOUNTER — Encounter (HOSPITAL_COMMUNITY): Payer: Self-pay

## 2024-12-29 ENCOUNTER — Telehealth: Payer: Self-pay | Admitting: Physician Assistant

## 2024-12-29 NOTE — Telephone Encounter (Signed)
 Pt answered and is aware of the reschedule appt

## 2025-01-01 ENCOUNTER — Ambulatory Visit: Admitting: Pulmonary Disease

## 2025-01-01 ENCOUNTER — Encounter: Payer: Self-pay | Admitting: Pulmonary Disease

## 2025-01-01 VITALS — BP 121/71 | HR 85 | Temp 97.7°F | Ht 66.0 in | Wt 165.0 lb

## 2025-01-01 DIAGNOSIS — J849 Interstitial pulmonary disease, unspecified: Secondary | ICD-10-CM

## 2025-01-01 DIAGNOSIS — C3490 Malignant neoplasm of unspecified part of unspecified bronchus or lung: Secondary | ICD-10-CM

## 2025-01-01 DIAGNOSIS — Z87891 Personal history of nicotine dependence: Secondary | ICD-10-CM

## 2025-01-01 DIAGNOSIS — J701 Chronic and other pulmonary manifestations due to radiation: Secondary | ICD-10-CM

## 2025-01-01 DIAGNOSIS — Z902 Acquired absence of lung [part of]: Secondary | ICD-10-CM

## 2025-01-01 DIAGNOSIS — W888XXD Exposure to other ionizing radiation, subsequent encounter: Secondary | ICD-10-CM

## 2025-01-01 NOTE — Patient Instructions (Signed)
" °  VISIT SUMMARY: Today, we discussed your lung condition and medication management. Your breathing has improved, and we reviewed your recent CT scan results. We also addressed your voice changes and hematologic abnormalities.  YOUR PLAN: RADIATION-INDUCED PULMONARY FIBROSIS, LEFT LUNG: You have scarring in your left lung from previous radiation therapy. Your recent CT scan shows these changes are stable. -Reduce Breztri  to one puff twice daily for a week, then stop using it. -Monitor your voice changes after stopping Breztri . If your voice does not improve, we may refer you to an ENT specialist. -Contact us  through MyChart if your breathing gets worse after stopping Breztri .  STABLE RIGHT LUNG NODULE: You have a small nodule in your right lung that has not changed in size and shows no signs of active cancer. -No immediate action is needed as the nodule is stable. "

## 2025-01-01 NOTE — Progress Notes (Signed)
 "              Bradley Irwin    [id number]    September 05, 1946  Primary Care Physician:[name], Leita DEL, MD  Referring Physician: Stephane Leita DEL, MD [address] [place],  [place] [address]  Chief complaint: Follow-up for interstitial lung disease, radiation pneumonitis, recurrent lung cancer, bladder cancer  HPI: 79 y.o. with history of CLL, lung cancer Diagnosed with stage Ia non-small cell lung cancer, adenocarcinoma s/p wedge resection of the left upper lobe with lymph node sampling in June 2020.  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  Other history significant for kidney cancer status post right nephrectomy in 2008.    Diagnosed with recurrent lung cancer in March 2025 with CT scans showing a soft tissue density around his previous staple line with hypermetabolic PET scan. Completed radiation therapy in May 2025 but developed post radiation pneumonitis.  Treated with prednisone  completing steroid taper in October 2025  Interim history: Discussed the use of AI scribe software for clinical note transcription with the patient, who gave verbal consent to proceed.  History of Present Illness Bradley Irwin is a 79 year old male with recurrent lung cancer who presents with difficulty breathing.   Interval history: Bradley Irwin is a 79 year old male with radiation pneumonitis who presents for follow-up of his lung condition and medication management. He is accompanied by his wife.  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 Sherrod, Oncology.  Recent bone marrow biopsy performed for thrombocytopenia and anemia - Awaiting results of bone marrow biopsy   Relevant pulmonary history Pets: No pets.  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  He has a feather pillow for the past 2 to 3 years Exposures: Possible asbestos and feather pillow exposure as above Smoking history: Quit smoking in 1984.  13-pack-year smoker Travel history: No significant travel Relevant family history: No family history of lung disease  Outpatient Encounter Medications as of 01/01/2025  Medication Sig   acetaminophen  (TYLENOL ) 500 MG tablet Take 500 mg by mouth at bedtime.   alfuzosin (UROXATRAL) 10 MG 24 hr tablet Take 10 mg by mouth daily.   allopurinol (ZYLOPRIM) 100 MG tablet Take 100 mg by mouth daily.   ALPRAZolam  (XANAX ) 0.5 MG tablet Take 0.5 mg by mouth 2 (two) times daily as needed for anxiety.    amitriptyline (ELAVIL) 10 MG tablet Take 10 mg by mouth at bedtime as needed.   aspirin  EC 81 MG tablet Take 81 mg by mouth daily. Swallow whole.   budesonide-glycopyrrolate-formoterol (BREZTRI  AEROSPHERE) 160-9-4.8 MCG/ACT AERO inhaler Inhale 2 puffs into the lungs in the morning and at bedtime.   clidinium-chlordiazePOXIDE  (LIBRAX ) 5-2.5 MG capsule Take 1 capsule by mouth daily as needed (ibs).   Colchicine  0.6 MG CAPS Take 1 tablet by mouth as needed (gout).   Cranberry-Vitamin C-Probiotic (AZO CRANBERRY PO) Take 2 tablets by mouth daily.   diclofenac Sodium (VOLTAREN) 1 % GEL Apply 1 Application topically 4 (four) times daily as needed (pain).   dicyclomine (BENTYL) 20 MG tablet Take 20 mg by mouth daily as needed  for spasms.   Docosanol (ABREVA) 10 % CREA Apply 1 Application topically daily as needed (fever blisters).   Docusate Sodium  (DSS) 100 MG CAPS Take 100 mg by mouth 2 (two) times daily as needed (constipation).   ferrous sulfate 325 (65 FE) MG tablet Take 325 mg by mouth daily.   fluticasone (FLONASE) 50 MCG/ACT nasal spray Place 1 spray into both nostrils daily.   hydrOXYzine (ATARAX) 25 MG tablet Take 25 mg by mouth as needed for itching.   levothyroxine  (SYNTHROID ) 137 MCG tablet Take 137 mcg by mouth every morning.   Methenamine-Sodium Salicylate (AZO URINARY TRACT DEFENSE) 162-162.5 MG TABS Take 1 tablet by mouth as needed (urinary pain).   nitroGLYCERIN  (NITROSTAT ) 0.4 MG SL tablet Place 0.4 mg under the tongue every 5 (five) minutes as needed for chest pain.   omeprazole (PRILOSEC) 20 MG capsule Take 20 mg by mouth 2 (two) times daily.    oxybutynin  (DITROPAN ) 5 MG tablet Take 1 tablet (5 mg total) by mouth 2 (two) times daily as needed for bladder spasms.   Polyethyl Glycol-Propyl Glycol (SYSTANE OP) Apply 1 drop to eye as needed. Each eye   polyethylene glycol (MIRALAX / GLYCOLAX) 17 g packet Take 17 g by mouth daily.   Probiotic Product (PROBIOTIC PO) Take 1 capsule by mouth daily.   rosuvastatin (CRESTOR) 10 MG tablet Take 10 mg by mouth daily.   simethicone  (MYLICON) 125 MG chewable tablet Chew 125 mg by mouth every 6 (six) hours as needed for flatulence.   tadalafil (CIALIS) 5 MG tablet    Vibegron (GEMTESA) 75 MG TABS Take 1 tablet by mouth daily.   No facility-administered encounter medications on file as of 01/01/2025.   Vitals:   01/01/25 1141  BP: 121/71  Pulse: 85  Temp: 97.7 F (36.5 C)  Height: 5' 6 (1.676 m)  Weight: 165 lb (74.8 kg)  SpO2: 97%  TempSrc: Oral  BMI (Calculated): 26.64     Physical Exam GEN: No acute distress CV: Regular rate and rhythm no murmurs LUNGS: Clear to auscultation bilaterally normal respiratory effort SKIN JOINTS: Warm and dry no  rash    Data Reviewed: Imaging: CT chest 02/03/2022-groundglass attenuation nodule in the right upper lobe.  Worsening lymphadenopathy in bilateral axilla and subpectoral nodal station.  Widespread areas of groundglass attenuation and septal thickening bilaterally.  Indeterminate pattern.    CT chest 08/02/2022-areas of groundglass attenuation, septal thickening.  CT chest 08/08/2023-nodular thickening along the inferior margin of the left upper lobe, stable groundglass nodule in the right upper lobe, stable lymph nodes, stable interstitial changes at the base  CT chest 02/20/2024-interval enlargement of nodular soft tissue in the left upper lobe staple line  PET scan 03/14/2024-hypermetabolic nodule in the left upper lobe.  Enlarged bilateral axillary lymph node.  Intense metabolic activity in the prostate gland.  CT chest 08/20/2024-new extensive consolidative airspace opacity in the left upper lobe and superior segment of left lower lobe consistent with radiation pneumonitis and fibrosis.  CT high-resolution 11/06/2024-consolidative airspace changes in the left upper lobe and superior left lower lobe.  Minimal basilar reticulation.  4 mm right upper lobe nodule.  CT chest 11/25/2024-evolving postradiation changes in the left lung, stable right upper lobe nodule.  I reviewed the images personally.  PFTs: 05/14/2019 FVC 3.54 [94%], FEV1 2.97 [108%], F/F 84, TLC 5.58 [89%], DLCO 20.25 [89%] Normal test  Labs: CTD serologies 03/13/2022-ANA 1: 40, cytoplasmic speckled Assessment & Plan Radiation-induced pulmonary fibrosis, left lung Radiation-induced pulmonary fibrosis in the left lung due to previous radiation therapy. CT scan in December shows evolving post-radiation changes, indicating scarring but not worsening. No evidence of idiopathic pulmonary fibrosis. Voice changes may be related to Breztri  use. - Taper Breztri  to one puff twice daily for a week, then discontinue. - Monitor for voice  changes post-Breztri  discontinuation; will consider ENT referral if voice changes persist. - Contact provider via MyChart if breathing worsens after stopping Breztri .  History of left lung cancer, status post resection and radiation Stable right lung nodule Surveillance follow-up imaging per oncology.   Plan/Recommendations: Taper off Breztri  inhaler  Lonna Coder MD [facility] 01/01/2025, 11:54 AM  CC: Stephane Leita DEL, MD    "

## 2025-01-02 NOTE — Progress Notes (Unsigned)
 [facility] OFFICE PROGRESS NOTE  Stephane Leita DEL, MD [address] 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  Status post 6 weeks of BCG treatment. Diagnosed in November 2024.   PRIOR THERAPY: 1) Status post left upper lobe wedge resection with lymph node sampling.  2) Status post 6 weeks of intravesical BCG treatment. Diagnosed in November 2024. Completed in January 2025 3) SBRT to recurrent stage Ia non-small cell lung cancer, adenocarcinoma of the left upper lobe under the care of Dr. Dewey completed May 16, 2024 4) BCG treatment under the care of Dr. Nieves, he had 3 rounds. His most recent in December 2025  CURRENT THERAPY: Observation   INTERVAL HISTORY: Bradley Irwin 79 y.o. male returns to the clinic today for a follow-up visit accompanied by his wife.  The patient was last seen in the clinic on 12/02/2024 by Dr. Sherrod.   The patient has a history of recurrent non-small cell lung cancer that was initially diagnosed in May 2025 is stage I with Positive met X114 splice mutation and PD-L1 expression of 90%.  He is also currently following with Dr. Nieves for BCG treatment for bladder cancer, with three rounds completed, most recently in December.  He is scheduled for a repeat cystoscopy in April 2026.  He completed radiation therapy for non-small cell lung cancer in May. Since radiation, he has experienced a change in voice, which he believes is related to the radiation. He continues to have shortness of breath and increased cough, which he reports have been  worse since his radiation. In September, he had chest pain and increased dyspnea, and he reports that these symptoms improved after taking prednisone . He denies hemoptysis.  Patient has chronic constipation which he takes stool softeners.  He takes MiraLAX if needed.  He is concerned that the iron  may worsen his constipation.  He resumed taking iron  supplements around the time of his bone marrow biopsy.  So takes a vitamin C supplement.  His ferritin was low last month at 13.  He also has small lymphocytic lymphoma which was diagnosed in 2021.  His platelet count has declined recently as well as his hemoglobin.  He has a history of alcohol  use.  He does not drink anymore.  His most recent imagings do not mention any cirrhosis.  He does have splenomegaly.  He has chronic kidney disease for which he sees Dr. Dalene with nephrology.  Delsie he denies any changes in his health.  Denies any fever, chills, or unexplained weight loss.  He reports that he no longer has significant night sweats.  He states that he currently does have some light sweats.  He does see Dr. Theophilus from pulmonary medicine.  He is here today for evaluation and to review his bone marrow biopsy results.  He is here today for evaluation and repeat blood work.   MEDICAL HISTORY: Past Medical History:  Diagnosis Date   Anxiety    Atypical mole 06/08/2010   Right Abdomen(moderate) terril)   Atypical mole 04/25/1995   Left Upper ShoulderBlade(slight)  Atypical mole 12/31/2013   Left Mid Back(Moderate) terril)   Bladder cancer (HCC)    CLL (chronic lymphocytic leukemia) (HCC)    Colon polyps    Coronary artery disease    Dyspnea    Eczema    GERD (gastroesophageal reflux disease)    Gout    Hearing loss    Hx of cardiovascular stress test    Lexiscan  Myoview  5/16:  The study is normal. No ischemia identified. This is a low risk study. Overall left ventricular systolic function was normal. LV cavity size is  normal. The left ventricular ejection fraction is normal (55-65%). Sensitivity reduced by bowel loop attenuation artifact.   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  MEDICATIONS:  Current Outpatient Medications  Medication Sig Dispense Refill   acetaminophen  (TYLENOL ) 500 MG tablet Take 500 mg by mouth at bedtime. (Patient taking differently: Take 500 mg by mouth as needed for moderate pain (pain score 4-6).)     alfuzosin (UROXATRAL) 10 MG 24 hr tablet Take 10 mg by mouth daily.     allopurinol (ZYLOPRIM) 100 MG tablet Take 100 mg by mouth daily.     ALPRAZolam  (XANAX ) 0.5 MG tablet Take 0.5 mg by mouth 2 (two) times daily as needed for anxiety.      amitriptyline (ELAVIL) 10 MG tablet Take 10 mg by mouth at bedtime as needed.     ascorbic acid (VITAMIN C) 500 MG tablet Take 500 mg by mouth daily.     aspirin  EC 81 MG tablet Take 81 mg by mouth daily. Swallow whole.     budesonide-glycopyrrolate-formoterol (BREZTRI  AEROSPHERE) 160-9-4.8 MCG/ACT AERO inhaler Inhale 2 puffs into the lungs in the morning and at bedtime. 5.9 g 3   clidinium-chlordiazePOXIDE  (LIBRAX ) 5-2.5 MG capsule Take 1 capsule by mouth daily as needed (ibs).     Colchicine 0.6 MG CAPS Take 1 tablet by mouth as needed (gout).     Cranberry 50 MG CHEW Cranberry     diclofenac Sodium (VOLTAREN) 1 % GEL Apply 1 Application topically 4 (four) times daily as needed (pain).     dicyclomine (BENTYL) 20 MG tablet Take 20 mg by mouth daily as needed for spasms.     Docosanol (ABREVA) 10 % CREA Apply 1 Application topically daily as  needed (fever blisters).     Docusate Sodium  (DSS) 100 MG CAPS Take 100 mg by mouth 2 (two) times daily as needed (constipation).     ferrous sulfate 325 (65 FE) MG tablet Take 325 mg by mouth daily.     fluticasone (FLONASE) 50 MCG/ACT nasal spray Place 1 spray into both nostrils daily.     hydrOXYzine (ATARAX) 25 MG tablet Take 25 mg by mouth as needed for itching.     levothyroxine  (SYNTHROID ) 137 MCG tablet Take 137 mcg by mouth every morning.     nitroGLYCERIN  (NITROSTAT ) 0.4 MG SL tablet Place 0.4 mg under the tongue every 5 (five) minutes as needed for chest pain.  omeprazole (PRILOSEC) 20 MG capsule Take 20 mg by mouth 2 (two) times daily.      oxybutynin  (DITROPAN ) 5 MG tablet Take 1 tablet (5 mg total) by mouth 2 (two) times daily as needed for bladder spasms. 15 tablet 1   Polyethyl Glycol-Propyl Glycol (SYSTANE OP) Apply 1 drop to eye as needed. Each eye (Patient taking differently: Apply 1 drop to eye 2 (two) times daily. Each eye)     polyethylene glycol (MIRALAX / GLYCOLAX) 17 g packet Take 17 g by mouth daily. (Patient taking differently: Take 17 g by mouth as needed for moderate constipation.)     Probiotic Product (PROBIOTIC PO) Take 1 capsule by mouth daily.     rosuvastatin (CRESTOR) 10 MG tablet Take 10 mg by mouth daily.     simethicone  (MYLICON) 125 MG chewable tablet Chew 125 mg by mouth every 6 (six) hours as needed for flatulence.     tadalafil (CIALIS) 5 MG tablet      Vibegron (GEMTESA) 75 MG TABS Take 1 tablet by mouth daily.     Methenamine-Sodium Salicylate (AZO URINARY TRACT DEFENSE) 162-162.5 MG TABS Take 1 tablet by mouth as needed (urinary pain). (Patient not taking: Reported on 01/06/2025)     No current facility-administered medications for this visit.    SURGICAL HISTORY:  Past Surgical History:  Procedure Laterality Date   CARDIAC CATHETERIZATION     CHOLECYSTECTOMY  10/26/2011   COLONOSCOPY     CORONARY STENT PLACEMENT  2006   CYSTOSCOPY W/  RETROGRADES Left 10/02/2023   Procedure: CYSTOSCOPY WITH RETROGRADE PYELOGRAM;  Surgeon: Nieves Cough, MD;  Location: [facility];  Service: Urology;  Laterality: Left;  60 MINS FOR CASE   CYSTOSCOPY WITH BIOPSY N/A 10/02/2023   Procedure: CYSTOSCOPY WITH BLADDER BIOPSY FULGURATION;  Surgeon: Nieves Cough, MD;  Location: [facility];  Service: Urology;  Laterality: N/A;   CYSTOSCOPY WITH BIOPSY Left 10/30/2023   Procedure: CYSTOSCOPY WITH RETROROGRADE;  Surgeon: Nieves Cough, MD;  Location: [facility];  Service: Urology;  Laterality: Left;   CYSTOSCOPY WITH URETEROSCOPY AND STENT PLACEMENT Left 10/30/2023   Procedure: CYSTOSCOPY WITH  LEFT URETEROSCOPY AND LEFT URETERAL STENT EXCHANGE;  Surgeon: Nieves Cough, MD;  Location: [facility];  Service: Urology;  Laterality: Left;  75 MINUTES   CYSTOSCOPY/URETEROSCOPY/HOLMIUM LASER/STENT PLACEMENT Left 10/02/2023   Procedure: CYSTOSCOPY, LEFT DIAGNOSTIC URETEROSCOPY/STENT PLACEMENT;  Surgeon: Nieves Cough, MD;  Location: [facility];  Service: Urology;  Laterality: Left;   FINGER SURGERY Right 79 y/o   cut index rt   INTERCOSTAL NERVE BLOCK  06/16/2019   Procedure: Intercostal Nerve Block;  Surgeon: Army Dallas NOVAK, MD;  Location: [facility] OR;  Service: Thoracic;;   IR BONE MARROW BIOPSY & ASPIRATION  12/22/2024   LIPOMA EXCISION Left 11/18/2013   Procedure: EXCISION POSTERIOR NECK LIPOMA ;  Surgeon: Jina Nephew, MD;  Location: [facility];  Service: General;  Laterality: Left;   LIVER BIOPSY  09/18/2011   LIVER LOBECTOMY  10/26/2011   LYMPH NODE DISSECTION  06/16/2019   Procedure: Lymph Node Dissection;  Surgeon: Army Dallas NOVAK, MD;  Location: [facility] OR;  Service: Thoracic;;   NEPHRECTOMY Right 2008   right   TONSILLECTOMY     VIDEO ASSISTED THORACOSCOPY (VATS)/WEDGE RESECTION Left 06/16/2019   Procedure: VIDEO ASSISTED THORACOSCOPY (VATS)/MINI THOROCOTOMY WITH LEFT UPPER LOBE WEDGE RESECTION;   Surgeon: Army Dallas NOVAK, MD;  Location: [facility] OR;  Service: Thoracic;  Laterality: Left;   VIDEO BRONCHOSCOPY N/A  06/16/2019   Procedure: VIDEO BRONCHOSCOPY;  Surgeon: Army Dallas NOVAK, MD;  Location: [facility] OR;  Service: Thoracic;  Laterality: N/A;    REVIEW OF SYSTEMS:   Review of Systems  Constitutional: Positive for fatigue. Negative for appetite change, chills, fatigue, fever and unexpected weight change.  HENT: Minor epistaxis last month. Negative for mouth sores, nosebleeds, sore throat and trouble swallowing.   Eyes: Negative for eye problems and icterus.  Respiratory: Positive for dyspnea on exertion. Negative for cough, hemoptysis, and wheezing.   Cardiovascular: Negative for chest pain and leg swelling.  Gastrointestinal: Positive for occasional heartburn and indigestion. Positive for frequent constipation. Negative for abdominal pain, diarrhea, nausea and vomiting.  Genitourinary: Negative for bladder incontinence, difficulty urinating, dysuria, frequency and hematuria.   Musculoskeletal: Negative for back pain, gait problem, neck pain and neck stiffness.  Skin: Negative for itching and rash.  Neurological: Negative for dizziness, extremity weakness, gait problem, headaches, light-headedness and seizures.  Hematological: Negative for adenopathy. Positive for some extremity bruising.  Psychiatric/Behavioral: Negative for confusion, depression and sleep disturbance. The patient is not nervous/anxious.     PHYSICAL EXAMINATION:  Blood pressure (!) 141/57, pulse 85, temperature 97.6 F (36.4 C), temperature source Temporal, resp. rate 17, height 5' 6 (1.676 m), weight 163 lb 9.6 oz (74.2 kg), SpO2 98%.  ECOG PERFORMANCE STATUS: 1  Physical Exam  Constitutional: Oriented to person, place, and time and well-developed, well-nourished, and in no distress.  HENT:  Head: Normocephalic and atraumatic.  Mouth/Throat: Oropharynx is clear and moist. No oropharyngeal exudate.  Eyes:  Conjunctivae are normal. Right eye exhibits no discharge. Left eye exhibits no discharge. No scleral icterus.  Neck: Normal range of motion. Neck supple.  Cardiovascular: Normal rate, regular rhythm, normal heart sounds and intact distal pulses.   Pulmonary/Chest: Effort normal and breath sounds normal. No respiratory distress. No wheezes. No rales.  Abdominal: Soft. Bowel sounds are normal. Exhibits no distension and no mass. There is no tenderness.  Musculoskeletal: Normal range of motion. Exhibits no edema.  Lymphadenopathy:    No cervical adenopathy.  Neurological: Alert and oriented to person, place, and time. Exhibits normal muscle tone. Gait normal. Coordination normal.  Skin: Positive for mild bruising. Skin is warm and dry. No rash noted. Not diaphoretic. No erythema. No pallor.  Psychiatric: Mood, memory and judgment normal.  Vitals reviewed.  LABORATORY DATA: Lab Results  Component Value Date   WBC 5.7 01/05/2025   HGB 9.0 (L) 01/05/2025   HCT 30.2 (L) 01/05/2025   MCV 80.7 01/05/2025   PLT 89 (L) 01/05/2025      Chemistry      Component Value Date/Time   NA 141 01/05/2025 0920   K 3.9 01/05/2025 0920   CL 107 01/05/2025 0920   CO2 23 01/05/2025 0920   BUN 27 (H) 01/05/2025 0920   CREATININE 1.97 (H) 01/05/2025 0920   CREATININE 1.30 (H) 02/06/2016 1714      Component Value Date/Time   CALCIUM  9.6 01/05/2025 0920   ALKPHOS 124 01/05/2025 0920   AST 28 01/05/2025 0920   ALT 19 01/05/2025 0920   BILITOT 0.4 01/05/2025 0920       RADIOGRAPHIC STUDIES:  IR BONE MARROW BIOPSY & ASPIRATION Result Date: 12/22/2024 INDICATION: Anemia and thrombocytopenia. EXAM: FLUOROSCOPIC GUIDED BONE MARROW ASPIRATES AND BIOPSY Physician: Juliene SAUNDERS. Henn, MD MEDICATIONS: Moderate sedation ANESTHESIA/SEDATION: Moderate (conscious) sedation was employed during this procedure. A total of Versed  2 mg and fentanyl  100 mcg was administered intravenously at the order  of the provider  performing the procedure. Total intra-service moderate sedation time:  . Patient's level of consciousness and vital signs were monitored continuously by radiology nurse throughout the procedure under the supervision of the provider performing the procedure. FLUOROSCOPY: Radiation Exposure Index (as provided by the fluoroscopic device): 2 mGy Kerma COMPLICATIONS: None immediate. PROCEDURE: The procedure was explained to the patient. The risks and benefits of the procedure were discussed and the patient's questions were addressed. Informed consent was obtained from the patient. The patient was placed prone on interventional table. The back was prepped and draped in sterile fashion. Maximal barrier sterile technique was utilized including caps, mask, sterile gowns, sterile gloves, sterile drape, hand hygiene and skin antiseptic. The skin and right posterior ilium were anesthetized with 1% lidocaine . 11 gauge bone needle was directed into the right ilium with fluoroscopic guidance. Two aspirates and 1 core biopsy were obtained. Bandage placed over the puncture site. Fluoroscopic image saved for documentation. IMPRESSION: Fluoroscopic guided bone marrow aspiration and core biopsy. Electronically Signed   By: Juliene Balder M.D.   On: 12/22/2024 15:06     ASSESSMENT/PLAN:  This is a very pleasant 79 year old Caucasian male with a history of: 1)  stage I non-small cell lung cancer, adenocarcinoma.  He is Positive with met 14 splice mutation.  He status post wedge resection of the left upper lobe and lymph node sampling in June 2020. He has suspicious local recurrence that was seen in March 2025.   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 3) He also has bladder cancer in situ and he is following closely with Dr. Nieves.  The patient  underwent 6 weeks of BCG treatment and undergoes cystoscopies.   Patient  recently had a repeat bone marrow biopsy and aspirate.  He was seen with Dr. Sherrod. 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 No indication to start treatment at this time but requires close monitoring. Dr. Sherrod believes the anemia can be explained by the iron  deficiency from last month's ferritin labs.   The patient will take an iron  supplement with vitamin C.   I have ordered iron  infusions at the [address] [facility] center with venofer  300 mg weekly x3.   I ordered CBC, iron  studies, ferritin, sample of blood bank, and CMP 3 months to monitor the anemia.  Will order his next restaging CT scan at his next appointment.  The patient will continue to follow with Dr. Nieves for his bladder cancer.  He is expected to have a repeat cystoscopy in April with urology.  The patient was advised to use saline spray for his nose and avoid forceful blowing.  He also takes aspirin .  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 - Confirmed ongoing nephrology follow-up with Dr. Dalene.  Gastroesophageal reflux disease (GERD) Chronic GERD managed with omeprazole. Mild increase in symptoms likely due to dietary changes. - Reinforced lifestyle modifications (remain upright after eating). - Reviewed current omeprazole use.  Constipation Chronic constipation likely from IBS and iron  supplementation. Current regimen includes stool softeners and polyethylene glycol. - Advised titration of stool softener as needed (up to 3-4 tablets/day). - Encouraged increased activity, hydration, and dietary fiber intake.  The patient was advised to call immediately if he has any concerning symptoms in the interval. The patient voices understanding of current disease status and treatment options and is in agreement with the  current care plan. All questions were answered. The patient knows to call the clinic with any problems, questions or concerns. We can  certainly see the patient much sooner if necessary        Orders Placed This Encounter  Procedures   Iron  and Iron  Binding Capacity ([facility] only)    Standing Status:   Future    Expected Date:   04/06/2025    Expiration Date:   01/06/2026   Ferritin    Standing Status:   Future    Expected Date:   04/06/2025    Expiration Date:   01/06/2026   CBC with Differential (Cancer Center Only)    Standing Status:   Future    Expected Date:   04/06/2025    Expiration Date:   01/06/2026   CMP (Cancer Center only)    Standing Status:   Future    Expected Date:   04/06/2025    Expiration Date:   01/06/2026   Sample to Blood Bank    Standing Status:   Future    Expected Date:   04/06/2025    Expiration Date:   01/06/2026      Tishawn Friedhoff L Laquan Ludden, PA-C 01/06/2025  ADDENDUM: Hematology/Oncology Attending: I had a face-to-face encounter with the patient today.  I reviewed his record, lab, recent bone marrow biopsy and aspirate results and recommended his care plan.  This is a very pleasant 79 years old white male with multiple malignancies including recurrent non-small cell lung cancer that was initially diagnosed as a stage Ia in May 2025.  He also has stage Ia non-small cell lung cancer diagnosed in June 2020.  The patient also has small lymphocytic lymphoma diagnosed in 2021 and bladder carcinoma in situ diagnosed in November 2024 and undergoing treatment with BCG.  He was found to have pancytopenia recently and we ordered a bone marrow biopsy and aspirate to rule out any transformation of his disease.  The biopsy showed bone marrow involvement by the patient is known CLL with nodular pattern at approximately 50% of the cells with underlying orderly trilineage hematopoiesis there was no evidence for blast transformation. I discussed the lab result with the patient and recommended for him to continue on observation but for the iron  deficiency anemia, we will arrange for him to receive iron   infusion at the [facility]. The patient will come back for follow-up visit in 3 months for evaluation with repeat blood work including iron  study and ferritin. He was advised to call immediately if he has any other concerning symptoms in the interval. Disclaimer: This note was dictated with voice recognition software. Similar sounding words can inadvertently be transcribed and may be missed upon review. Sherrod MARLA Sherrod, MD

## 2025-01-05 ENCOUNTER — Inpatient Hospital Stay: Attending: Internal Medicine

## 2025-01-05 DIAGNOSIS — Z860100 Personal history of colon polyps, unspecified: Secondary | ICD-10-CM | POA: Insufficient documentation

## 2025-01-05 DIAGNOSIS — I129 Hypertensive chronic kidney disease with stage 1 through stage 4 chronic kidney disease, or unspecified chronic kidney disease: Secondary | ICD-10-CM | POA: Diagnosis not present

## 2025-01-05 DIAGNOSIS — Z8701 Personal history of pneumonia (recurrent): Secondary | ICD-10-CM | POA: Insufficient documentation

## 2025-01-05 DIAGNOSIS — Z79890 Hormone replacement therapy (postmenopausal): Secondary | ICD-10-CM | POA: Insufficient documentation

## 2025-01-05 DIAGNOSIS — Z885 Allergy status to narcotic agent status: Secondary | ICD-10-CM | POA: Diagnosis not present

## 2025-01-05 DIAGNOSIS — Z85528 Personal history of other malignant neoplasm of kidney: Secondary | ICD-10-CM | POA: Diagnosis not present

## 2025-01-05 DIAGNOSIS — K581 Irritable bowel syndrome with constipation: Secondary | ICD-10-CM | POA: Diagnosis not present

## 2025-01-05 DIAGNOSIS — D696 Thrombocytopenia, unspecified: Secondary | ICD-10-CM | POA: Diagnosis not present

## 2025-01-05 DIAGNOSIS — Z7982 Long term (current) use of aspirin: Secondary | ICD-10-CM | POA: Diagnosis not present

## 2025-01-05 DIAGNOSIS — Z86006 Personal history of melanoma in-situ: Secondary | ICD-10-CM | POA: Diagnosis not present

## 2025-01-05 DIAGNOSIS — D539 Nutritional anemia, unspecified: Secondary | ICD-10-CM

## 2025-01-05 DIAGNOSIS — R0602 Shortness of breath: Secondary | ICD-10-CM | POA: Insufficient documentation

## 2025-01-05 DIAGNOSIS — C9110 Chronic lymphocytic leukemia of B-cell type not having achieved remission: Secondary | ICD-10-CM | POA: Diagnosis present

## 2025-01-05 DIAGNOSIS — Z9049 Acquired absence of other specified parts of digestive tract: Secondary | ICD-10-CM | POA: Diagnosis not present

## 2025-01-05 DIAGNOSIS — Z79899 Other long term (current) drug therapy: Secondary | ICD-10-CM | POA: Diagnosis not present

## 2025-01-05 DIAGNOSIS — Z882 Allergy status to sulfonamides status: Secondary | ICD-10-CM | POA: Insufficient documentation

## 2025-01-05 DIAGNOSIS — R161 Splenomegaly, not elsewhere classified: Secondary | ICD-10-CM | POA: Diagnosis not present

## 2025-01-05 DIAGNOSIS — C3412 Malignant neoplasm of upper lobe, left bronchus or lung: Secondary | ICD-10-CM | POA: Insufficient documentation

## 2025-01-05 DIAGNOSIS — R059 Cough, unspecified: Secondary | ICD-10-CM | POA: Insufficient documentation

## 2025-01-05 DIAGNOSIS — Z86008 Personal history of in-situ neoplasm of other site: Secondary | ICD-10-CM | POA: Diagnosis not present

## 2025-01-05 DIAGNOSIS — R499 Unspecified voice and resonance disorder: Secondary | ICD-10-CM | POA: Insufficient documentation

## 2025-01-05 DIAGNOSIS — N183 Chronic kidney disease, stage 3 unspecified: Secondary | ICD-10-CM | POA: Insufficient documentation

## 2025-01-05 DIAGNOSIS — Z905 Acquired absence of kidney: Secondary | ICD-10-CM | POA: Diagnosis not present

## 2025-01-05 DIAGNOSIS — Z923 Personal history of irradiation: Secondary | ICD-10-CM | POA: Diagnosis not present

## 2025-01-05 LAB — CBC WITH DIFFERENTIAL (CANCER CENTER ONLY)
Abs Immature Granulocytes: 0.01 K/uL (ref 0.00–0.07)
Basophils Absolute: 0 K/uL (ref 0.0–0.1)
Basophils Relative: 0 %
Eosinophils Absolute: 0.1 K/uL (ref 0.0–0.5)
Eosinophils Relative: 1 %
HCT: 30.2 % — ABNORMAL LOW (ref 39.0–52.0)
Hemoglobin: 9 g/dL — ABNORMAL LOW (ref 13.0–17.0)
Immature Granulocytes: 0 %
Lymphocytes Relative: 49 %
Lymphs Abs: 2.7 K/uL (ref 0.7–4.0)
MCH: 24.1 pg — ABNORMAL LOW (ref 26.0–34.0)
MCHC: 29.8 g/dL — ABNORMAL LOW (ref 30.0–36.0)
MCV: 80.7 fL (ref 80.0–100.0)
Monocytes Absolute: 0.3 K/uL (ref 0.1–1.0)
Monocytes Relative: 5 %
Neutro Abs: 2.6 K/uL (ref 1.7–7.7)
Neutrophils Relative %: 45 %
Platelet Count: 89 K/uL — ABNORMAL LOW (ref 150–400)
RBC: 3.74 MIL/uL — ABNORMAL LOW (ref 4.22–5.81)
RDW: 20.1 % — ABNORMAL HIGH (ref 11.5–15.5)
WBC Count: 5.7 K/uL (ref 4.0–10.5)
nRBC: 0 % (ref 0.0–0.2)

## 2025-01-05 LAB — CMP (CANCER CENTER ONLY)
ALT: 19 U/L (ref 0–44)
AST: 28 U/L (ref 15–41)
Albumin: 4.2 g/dL (ref 3.5–5.0)
Alkaline Phosphatase: 124 U/L (ref 38–126)
Anion gap: 11 (ref 5–15)
BUN: 27 mg/dL — ABNORMAL HIGH (ref 8–23)
CO2: 23 mmol/L (ref 22–32)
Calcium: 9.6 mg/dL (ref 8.9–10.3)
Chloride: 107 mmol/L (ref 98–111)
Creatinine: 1.97 mg/dL — ABNORMAL HIGH (ref 0.61–1.24)
GFR, Estimated: 34 mL/min — ABNORMAL LOW
Glucose, Bld: 102 mg/dL — ABNORMAL HIGH (ref 70–99)
Potassium: 3.9 mmol/L (ref 3.5–5.1)
Sodium: 141 mmol/L (ref 135–145)
Total Bilirubin: 0.4 mg/dL (ref 0.0–1.2)
Total Protein: 6.6 g/dL (ref 6.5–8.1)

## 2025-01-06 ENCOUNTER — Inpatient Hospital Stay: Admitting: Physician Assistant

## 2025-01-06 ENCOUNTER — Telehealth: Payer: Self-pay | Admitting: Pharmacy Technician

## 2025-01-06 ENCOUNTER — Inpatient Hospital Stay

## 2025-01-06 VITALS — BP 141/57 | HR 85 | Temp 97.6°F | Resp 17 | Ht 66.0 in | Wt 163.6 lb

## 2025-01-06 DIAGNOSIS — C9110 Chronic lymphocytic leukemia of B-cell type not having achieved remission: Secondary | ICD-10-CM | POA: Diagnosis not present

## 2025-01-06 DIAGNOSIS — C3412 Malignant neoplasm of upper lobe, left bronchus or lung: Secondary | ICD-10-CM | POA: Diagnosis not present

## 2025-01-06 DIAGNOSIS — C3490 Malignant neoplasm of unspecified part of unspecified bronchus or lung: Secondary | ICD-10-CM

## 2025-01-06 DIAGNOSIS — D509 Iron deficiency anemia, unspecified: Secondary | ICD-10-CM | POA: Diagnosis not present

## 2025-01-06 LAB — SURGICAL PATHOLOGY

## 2025-01-06 NOTE — Telephone Encounter (Signed)
 Bradley Irwin, the patient will be scheduled as soon as possible.  Auth Submission: NO AUTH NEEDED Site of care: Site of care: [facility] Payer: [employer] Medicare Medication & CPT/J Code(s) submitted: Venofer  (Iron  Sucrose) J1756 Diagnosis Code: D50.9 Route of submission (phone, fax, portal): n/a Phone # Fax # Auth type: Buy/Bill PB Units/visits requested: 300 mg x 3 Reference number: n/a Approval from: 01/06/2025 to 04/23/2025

## 2025-01-08 ENCOUNTER — Ambulatory Visit

## 2025-01-08 VITALS — BP 148/67 | HR 67 | Temp 97.5°F | Resp 18 | Ht 66.0 in | Wt 164.8 lb

## 2025-01-08 DIAGNOSIS — D509 Iron deficiency anemia, unspecified: Secondary | ICD-10-CM | POA: Diagnosis not present

## 2025-01-08 MED ORDER — DIPHENHYDRAMINE HCL 25 MG PO CAPS
25.0000 mg | ORAL_CAPSULE | Freq: Once | ORAL | Status: AC
  Administered 2025-01-08: 25 mg via ORAL
  Filled 2025-01-08: qty 1

## 2025-01-08 MED ORDER — IRON SUCROSE 300 MG IVPB - SIMPLE MED
300.0000 mg | Freq: Once | Status: AC
  Administered 2025-01-08: 300 mg via INTRAVENOUS
  Filled 2025-01-08: qty 265

## 2025-01-08 MED ORDER — ACETAMINOPHEN 325 MG PO TABS
650.0000 mg | ORAL_TABLET | Freq: Once | ORAL | Status: AC
  Administered 2025-01-08: 650 mg via ORAL
  Filled 2025-01-08: qty 2

## 2025-01-08 NOTE — Progress Notes (Signed)
 Diagnosis:  Iron  Deficiency Anemia  Provider:  Praveen Mannam MD  Procedure: IV Infusion  IV Type: Peripheral, IV Location: L Forearm   Venofer  (Iron  Sucrose), Dose: 300 mg  Infusion Start Time: 1340  Infusion Stop Time: 1526  Post Infusion IV Care: Observation period completed and Peripheral IV Discontinued  Discharge: Condition: Good, Destination: Home . AVS Declined  Performed by:  Maximiano JONELLE Pouch, LPN

## 2025-01-15 ENCOUNTER — Ambulatory Visit

## 2025-01-15 VITALS — BP 144/84 | HR 64 | Temp 97.3°F | Resp 14 | Ht 66.0 in | Wt 165.0 lb

## 2025-01-15 DIAGNOSIS — D509 Iron deficiency anemia, unspecified: Secondary | ICD-10-CM

## 2025-01-15 MED ORDER — IRON SUCROSE 300 MG IVPB - SIMPLE MED
300.0000 mg | Freq: Once | Status: AC
  Administered 2025-01-15: 300 mg via INTRAVENOUS
  Filled 2025-01-15: qty 265

## 2025-01-15 MED ORDER — DIPHENHYDRAMINE HCL 25 MG PO CAPS
25.0000 mg | ORAL_CAPSULE | Freq: Once | ORAL | Status: AC
  Administered 2025-01-15: 25 mg via ORAL
  Filled 2025-01-15: qty 1

## 2025-01-15 MED ORDER — ACETAMINOPHEN 325 MG PO TABS
650.0000 mg | ORAL_TABLET | Freq: Once | ORAL | Status: AC
  Administered 2025-01-15: 650 mg via ORAL
  Filled 2025-01-15: qty 2

## 2025-01-15 NOTE — Progress Notes (Signed)
 Diagnosis: , Acute Anemia  Provider:  Lonna Coder MD  Procedure: IV Infusion  IV Type: Peripheral, IV Location: R Forearm  , Venofer  (Iron  Sucrose), Dose: 300 mg  Infusion Start Time: 1351  Infusion Stop Time: 1525  Post Infusion IV Care: Observation period completed and Peripheral IV Discontinued  Discharge: Condition: Good, Destination: Home . AVS Declined  Performed by:  Donny Childes, RN

## 2025-01-23 ENCOUNTER — Ambulatory Visit

## 2025-01-30 ENCOUNTER — Ambulatory Visit

## 2025-02-11 ENCOUNTER — Ambulatory Visit

## 2025-02-25 ENCOUNTER — Ambulatory Visit: Admitting: Cardiovascular Disease

## 2025-04-02 ENCOUNTER — Inpatient Hospital Stay: Admitting: Internal Medicine

## 2025-04-02 ENCOUNTER — Inpatient Hospital Stay: Attending: Internal Medicine

## 2025-06-23 ENCOUNTER — Ambulatory Visit: Admitting: Pulmonary Disease
